# Patient Record
Sex: Male | Born: 1935 | Race: White | Hispanic: No | Marital: Married | Smoking: Never smoker
Health system: Southern US, Community
[De-identification: ages and names within clinical notes are randomized; demographics above are authoritative.]

## PROBLEM LIST (undated history)

## (undated) ENCOUNTER — Emergency Department (HOSPITAL_COMMUNITY): Payer: Medicare Other

## (undated) DIAGNOSIS — I219 Acute myocardial infarction, unspecified: Secondary | ICD-10-CM

## (undated) DIAGNOSIS — D649 Anemia, unspecified: Secondary | ICD-10-CM

## (undated) DIAGNOSIS — B0229 Other postherpetic nervous system involvement: Secondary | ICD-10-CM

## (undated) DIAGNOSIS — E785 Hyperlipidemia, unspecified: Secondary | ICD-10-CM

## (undated) DIAGNOSIS — E559 Vitamin D deficiency, unspecified: Secondary | ICD-10-CM

## (undated) DIAGNOSIS — I10 Essential (primary) hypertension: Secondary | ICD-10-CM

## (undated) DIAGNOSIS — I639 Cerebral infarction, unspecified: Secondary | ICD-10-CM

## (undated) DIAGNOSIS — I2510 Atherosclerotic heart disease of native coronary artery without angina pectoris: Secondary | ICD-10-CM

## (undated) DIAGNOSIS — R12 Heartburn: Secondary | ICD-10-CM

## (undated) DIAGNOSIS — K219 Gastro-esophageal reflux disease without esophagitis: Secondary | ICD-10-CM

## (undated) DIAGNOSIS — K5900 Constipation, unspecified: Secondary | ICD-10-CM

## (undated) DIAGNOSIS — G459 Transient cerebral ischemic attack, unspecified: Secondary | ICD-10-CM

## (undated) DIAGNOSIS — I4891 Unspecified atrial fibrillation: Secondary | ICD-10-CM

## (undated) HISTORY — DX: Atherosclerotic heart disease of native coronary artery without angina pectoris: I25.10

## (undated) HISTORY — DX: Other postherpetic nervous system involvement: B02.29

## (undated) HISTORY — DX: Heartburn: R12

## (undated) HISTORY — DX: Vitamin D deficiency, unspecified: E55.9

## (undated) HISTORY — DX: Essential (primary) hypertension: I10

## (undated) HISTORY — DX: Cerebral infarction, unspecified: I63.9

## (undated) HISTORY — DX: Transient cerebral ischemic attack, unspecified: G45.9

## (undated) HISTORY — DX: Unspecified atrial fibrillation: I48.91

## (undated) HISTORY — DX: Acute myocardial infarction, unspecified: I21.9

## (undated) HISTORY — PX: OTHER SURGICAL HISTORY: SHX169

## (undated) HISTORY — PX: COLONOSCOPY: SHX174

## (undated) HISTORY — PX: CATARACT EXTRACTION, BILATERAL: SHX1313

## (undated) HISTORY — DX: Hyperlipidemia, unspecified: E78.5

## (undated) HISTORY — DX: Anemia, unspecified: D64.9

---

## 2012-06-13 DIAGNOSIS — M79606 Pain in leg, unspecified: Secondary | ICD-10-CM | POA: Insufficient documentation

## 2013-07-02 ENCOUNTER — Encounter: Payer: Self-pay | Admitting: Podiatry

## 2013-07-02 ENCOUNTER — Ambulatory Visit (INDEPENDENT_AMBULATORY_CARE_PROVIDER_SITE_OTHER): Payer: Medicare Other | Admitting: Podiatry

## 2013-07-02 VITALS — BP 143/68 | HR 64 | Resp 12

## 2013-07-02 DIAGNOSIS — B351 Tinea unguium: Secondary | ICD-10-CM

## 2013-07-02 DIAGNOSIS — M79609 Pain in unspecified limb: Secondary | ICD-10-CM

## 2013-07-02 NOTE — Progress Notes (Signed)
   Subjective:    Patient ID: Edgar Thomas, male    DOB: 04/30/1930, 80 y.o.   MRN: [id number]  HPI '' B/L TOENAILS ARE THICK AND HAVE DISCOLORATION. TREATMENT TRIED LASER GOTTEN BETTER IN 2002.''  This patient presents today with with his wife complaining of uncomfortable thickened toenails. He said previous laser treatment which brought some temporary reduction of symptoms.   Review of Systems  Endocrine: Positive for cold intolerance.  Hematological: Bruises/bleeds easily.  All other systems reviewed and are negative.       Objective:   Physical Exam   An 80 year old white male who appears younger than his stated age is orientated x3. His wife is present today.  Vascular: The DP and PT pulses are 2/4 bilaterally  Neurological: Sensation intact bilaterally  Dermatological: Hypertrophic, discolored, incurvated toenails x10. Mild atrophic skin noted on feet bilaterally.  Musculoskeletal: No deformities noted      Assessment & Plan:   Assessment: Symptomatic onychomycoses x10 Satisfactory neurovascular status  Plan: All 10 toenails were debrided today without a bleeding. We discussed given options for mycotic toenails including laser, oral medication and topical medication. Patient is satisfied with repetitive debridement and will return on a when necessary basis.

## 2013-11-21 ENCOUNTER — Institutional Professional Consult (permissible substitution) (INDEPENDENT_AMBULATORY_CARE_PROVIDER_SITE_OTHER): Payer: Medicare Other | Admitting: Cardiothoracic Surgery

## 2013-11-21 ENCOUNTER — Encounter: Payer: Self-pay | Admitting: Cardiothoracic Surgery

## 2013-11-21 VITALS — BP 152/83 | HR 44 | Resp 20 | Ht 68.9 in | Wt 156.5 lb

## 2013-11-21 DIAGNOSIS — I2510 Atherosclerotic heart disease of native coronary artery without angina pectoris: Secondary | ICD-10-CM

## 2013-11-21 DIAGNOSIS — I209 Angina pectoris, unspecified: Secondary | ICD-10-CM

## 2013-11-21 DIAGNOSIS — I25119 Atherosclerotic heart disease of native coronary artery with unspecified angina pectoris: Secondary | ICD-10-CM

## 2013-11-24 ENCOUNTER — Telehealth: Payer: Self-pay

## 2013-11-24 ENCOUNTER — Other Ambulatory Visit: Payer: Self-pay | Admitting: *Deleted

## 2013-11-24 DIAGNOSIS — I2510 Atherosclerotic heart disease of native coronary artery without angina pectoris: Secondary | ICD-10-CM

## 2013-11-24 NOTE — Telephone Encounter (Signed)
Mr. Orvis BrillLoflin was seen in our office 6/26 and scheduled for surgery 11/26/13.  Wife, Conception ChancyKim Aird called 11/24/13 and said Mr. Wixon had thought about the surgery all weekend and wishes to wait until after the July 4th holiday to proceed.  She was instructed that the surgery time for 7/1 is still available and he should consider keeping it.  She was advised to call our office if the patient changes his mind.  Another phone call will be made later today to confirm that they do not wish to proceed with surgery 11/26/13.

## 2013-11-24 NOTE — Progress Notes (Signed)
[address]       [place] [id number]             [phone]                    Edgar ChurchBilly Thomas [facility] Medical Record #[id number] Date of Birth: 11/19/1929  Referring: Konrad Thomas, Brad, MD Primary Care: Konrad Thomas,BRAD, MD  Chief Complaint:    Chief Complaint  Patient presents with  . Coronary Artery Disease    Surgical eval, Cardiac Cath, ECHO [facility]17/[facility]- [facility]     History of Present Illness:    Edgar ChurchBilly Thomas 80 y.o. male is seen in the office  today at the patient's request for coronary artery disease. The patient has a long history of coronary artery disease, he first had a myocardial infarction [facility] by Dr. Theron AristaPeter with angioplasty of the LAD 23 years ago. 19 years ago he had a repeat angioplasty of distal right coronary artery branch. Since that time he's done relatively well with changes of lifestyle and diet through a wellness program in [place]. One week ago while home he was awakened by chest discomfort and because of her concern of a long wait in [facility] drove himself from [place] to [place]. [facility]. Echocardiogram and cardiac catheterization were performed on 11/17/2013 dr  Rolene ArbourHAKAS. The patient notes that he saw a surgeon in the hospital but decided to delay in making a decision and was discharged home. He comes to the office today for a second opinion because I had previously operated on his son. Should be noted in his cardiology consultation that he presented with episode of atrial fibrillation troponin elevation to 15. Since discharge home he is remained inactive and denies any further symptoms.   Echocardiogram showed left ventricular size mildly dilated ejection fraction 40-45% mild mitral regurgitation mild aortic regurgitation Cardiac catheterization: Shows diffuse disease of at least 50% left main LAD has a 99% stenosis at the ostium circumflex is a medium caliber  nondominant vessel with a 90% ostial stenosis and a second 90% stenosis in the mid circumflex the right coronary artery is heavily calcified 80% proximal PDA stenosis the right supplies collaterals to the left anterior descending   Current Activity/ Functional Status:  Patient is independent with mobility/ambulation, transfers, ADL's, IADL's.   Zubrod Score: At the time of surgery this patient's most appropriate activity status/level should be described as: []     0    Normal activity, no symptoms [x]     1    Restricted in physical strenuous activity but ambulatory, able to do out light work []     2    Ambulatory and capable of self care, unable to do work activities, up and about               >50 % of waking hours                              []     3    Only limited self care, in bed greater than 50% of waking hours []     4    Completely disabled, no self care, confined to bed or chair []     5    Moribund   Past Medical History  Diagnosis Date  . Hyperlipemia   . CAD (coronary artery disease)   . Heartburn   .  A-fib   . Hypertension   . Heart attack     Past Surgical History  Procedure Laterality Date  . Balloon angioplasty of lad      Family History  Problem Relation Age of Onset  . Heart disease Mother     died at age 4 of heart attack    History   Social History  . Marital Status: Married    Spouse Name: N/A    Number of Children: 6  . 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 Smoking status: Never Smoker   . Smokeless tobacco: Never Used  . Alcohol Use: No  . Drug Use: No  . Sexual Activity: Not on file   Other Topics Concern  . Not on file   Social History Narrative  . No narrative on file    History  Smoking status  . Never Smoker   Smokeless tobacco  . Never Used    History  Alcohol Use No     Allergies  Allergen Reactions  . Contrast Media [Iodinated Diagnostic Agents] Rash     19 years ago at [facility]    Current Outpatient Prescriptions  Medication Sig Dispense Refill  . aspirin 325 MG tablet Take 325 mg by mouth daily.      . metoprolol (LOPRESSOR) 50 MG tablet Take 50 mg by mouth 2 (two) times daily.      . Multiple Vitamin (MULTIVITAMIN) capsule Take 1 capsule by mouth daily.      . niacin (NIASPAN) 1000 MG CR tablet Take 1,000 mg by mouth at bedtime.      . nitroGLYCERIN (NITROSTAT) 0.4 MG SL tablet Place 0.4 mg under the tongue every 5 (five) minutes as needed for chest pain.      . Omega 3 1000 MG CAPS Take by mouth.      Marland Kitchen omeprazole (PRILOSEC) 20 MG capsule Take 20 mg by mouth daily.      . ramipril (ALTACE) 2.5 MG capsule Take 2.5 mg by mouth daily.      . rosuvastatin (CRESTOR) 40 MG tablet Take 40 mg by mouth daily.      . Vitamin D, Cholecalciferol, 1000 UNITS TABS Take by mouth.       No current facility-administered medications for this visit.     Review of Systems:     Cardiac Review of Systems: Y or N  Chest Pain [ y  ]  Resting SOB [n   ] Exertional SOB  [ n ]  Orthopnea [n  ]   Pedal Edema [n   ]    Palpitations Milo.Brash  ] Syncope  n[  ]   Presyncope [ n  ]  General Review of Systems: [Y] = yes [  ]=no Constitional: recent weight change [ n ];  Wt loss over the last 3 months [   ] anorexia [  ]; fatigue [  ]; nausea [  ]; night sweats [  ]; fever [  ]; or chills [  ];          Dental: poor dentition[  n]; Last Dentist visit:   Eye : blurred vision [  ]; diplopia [   ]; vision changes [  ];  Amaurosis fugax[  ]; Resp: cough [  ];  wheezing[  ];  hemoptysis[  ]; shortness of breath[  ]; paroxysmal nocturnal dyspnea[  ]; dyspnea on exertion[  ];  or orthopnea[  ];  GI:  gallstones[  ], vomitingn[  ];  dysphagia[  ]; melena[ n ];  hematochezia [  ]; heartburn[ y ];   Hx of  Colonoscopy[  ]; GU: kidney stones [  ]; hematuria[  ];   dysuria [  ];  nocturia[  ];  history of     obstruction [  ]; urinary frequency [  ]             Skin:  rash, swelling[  ];, hair loss[  ];  peripheral edema[  ];  or itching[  ]; Musculosketetal: myalgias[  ];  joint swelling[  ];  joint erythema[  ];  joint pain[  ];  back pain[  ];  Heme/Lymph: bruising[  ];  bleeding[  ];  anemia[  ];  Neuro: TIA[n  ];  headaches[  ];  stroke[n  ];  vertigo[  ];  seizures[  ];   paresthesias[  ];  difficulty walking[ n ];  Psych:depression[n  ]; anxiety[  n];  Endocrine: diabetes[n  ];  thyroid dysfunction[n  ];  Immunizations: Flu up to date Milo.Brash[n  ]; Pneumococcal up to date Milo.Brash[n  ];  Other:  Physical Exam: BP 152/83  Pulse 44  Resp 20  Ht 5' 8.9" (1.75 m)  Wt 156 lb 8.4 oz (71 kg)  BMI 23.18 kg/m2  SpO2 98%  PHYSICAL EXAMINATION:  General appearance: alert and cooperative Neurologic: intact Heart: regular rate and rhythm, S1, S2 normal, no murmur, click, rub or gallop Lungs: clear to auscultation bilaterally Abdomen: soft, non-tender; bowel sounds normal; no masses,  no organomegaly Extremities: extremities normal, atraumatic, no cyanosis or edema and Homans sign is negative, no sign of DVT Patient has no carotid bruits, does not appear to be in atrial fibrillation on this exam   Diagnostic Studies & Laboratory data:     Recent Radiology Findings:   No results found.    Recent Lab Findings: No results found for this basename: WBC, HGB, HCT, PLT, GLUCOSE, CHOL, TRIG, HDL, LDLDIRECT, LDLCALC, ALT, AST, NA, K, CL, CREATININE, BUN, CO2, TSH, INR, GLUF, HGBA1C      Assessment / Plan:   I have reviewed the patient's history exam and [facility] I agree with the recommendation made there with the patient's severe three-vessel coronary artery disease he will be best served with coronary artery bypass grafting. I've offered the patient to proceed quickly with this early next week. He notes that he has not decided he wants to have bypass surgery at this point and would like to think about it over the weekend. I made  it clear posterior he and his wife the nature of his high-grade lesions put him at extremely high risk of acute events. The risks and options of surgery were discussed in detail, as were the risks of delaying treatment.       I spent 60 minutes counseling the patient face to face. The total time spent in the appointment was 80 minutes.  Delight OvensEdward B Tyeler Goedken MD      [address] [place] [id number] Office [phone]   Beeper [phone]  11/24/2013 5:42 PM

## 2013-11-25 ENCOUNTER — Other Ambulatory Visit: Payer: Self-pay | Admitting: *Deleted

## 2013-11-25 ENCOUNTER — Encounter: Payer: Self-pay | Admitting: Cardiothoracic Surgery

## 2013-11-25 DIAGNOSIS — I2510 Atherosclerotic heart disease of native coronary artery without angina pectoris: Secondary | ICD-10-CM

## 2013-11-27 NOTE — Pre-Procedure Instructions (Signed)
Edgar Thomas  11/27/2013   Your procedure is scheduled on:  Wednesday December 03, 2013 at 8:30 AM.  [facility] at 6:30 AM.  Call this number if you have problems the morning of surgery: [phone]   Remember:   Do not eat food or drink liquids after midnight.   Take these medicines the morning of surgery with A SIP OF WATER: Metoprolol (Lopressor), Omeprazole (Prilosec)   Stop taking NSAIDS and herbals (Ex. Multivitamin, Omega 3, Vitamin D, Selenium)    Do not wear jewelry.  Do not wear lotions, powders, or cologne.  Men may shave face and neck.  Do not bring valuables to the hospital.  [facility] is not responsible for any belongings or valuables.               Contacts, dentures or bridgework may not be worn into surgery.  Leave suitcase in the car. After surgery it may be brought to your room.  For patients admitted to the hospital, discharge time is determined by your treatment team.               Patients discharged the day of surgery will not be allowed to drive home.  Name and phone number of your driver: Family/Friend  Special Instructions: Shower using CHG soap the night before and the morning of your surgery   Please read over the following fact sheets that you were given: Pain Booklet, Coughing and Deep Breathing, Blood Transfusion Information, Open Heart Packet, MRSA Information and Surgical Site Infection Prevention

## 2013-12-01 ENCOUNTER — Ambulatory Visit (HOSPITAL_COMMUNITY)
Admission: RE | Admit: 2013-12-01 | Discharge: 2013-12-01 | Disposition: A | Discharge status: Home / Self Care | Payer: Medicare Other | Source: Ambulatory Visit | Attending: Cardiothoracic Surgery | Admitting: Cardiothoracic Surgery

## 2013-12-01 ENCOUNTER — Encounter (HOSPITAL_COMMUNITY): Payer: Self-pay

## 2013-12-01 ENCOUNTER — Other Ambulatory Visit (HOSPITAL_COMMUNITY): Payer: BC Managed Care – PPO

## 2013-12-01 ENCOUNTER — Encounter (HOSPITAL_COMMUNITY)
Admission: RE | Admit: 2013-12-01 | Discharge: 2013-12-01 | Disposition: A | Discharge status: Home / Self Care | Payer: Medicare Other | Source: Ambulatory Visit | Attending: Cardiothoracic Surgery | Admitting: Cardiothoracic Surgery

## 2013-12-01 VITALS — BP 153/88 | HR 48 | Temp 97.8°F | Resp 20 | Ht 68.5 in | Wt 154.6 lb

## 2013-12-01 DIAGNOSIS — Z01810 Encounter for preprocedural cardiovascular examination: Secondary | ICD-10-CM | POA: Insufficient documentation

## 2013-12-01 DIAGNOSIS — I2510 Atherosclerotic heart disease of native coronary artery without angina pectoris: Secondary | ICD-10-CM | POA: Insufficient documentation

## 2013-12-01 DIAGNOSIS — I6529 Occlusion and stenosis of unspecified carotid artery: Secondary | ICD-10-CM | POA: Insufficient documentation

## 2013-12-01 DIAGNOSIS — I658 Occlusion and stenosis of other precerebral arteries: Secondary | ICD-10-CM | POA: Insufficient documentation

## 2013-12-01 HISTORY — DX: Constipation, unspecified: K59.00

## 2013-12-01 HISTORY — DX: Gastro-esophageal reflux disease without esophagitis: K21.9

## 2013-12-01 LAB — PULMONARY FUNCTION TEST
DL/VA % pred: 137 %
DL/VA: 6.14 ml/min/mmHg/L
DLCO cor % pred: 126 %
DLCO cor: 37.56 ml/min/mmHg
DLCO unc % pred: 126 %
DLCO unc: 37.56 ml/min/mmHg
FEF 25-75 Pre: 2.18 L/sec
FEF2575-%Pred-Pre: 128 %
FEV1-%Pred-Pre: 112 %
FEV1-Pre: 2.85 L
FEV1FVC-%Pred-Pre: 106 %
FEV6-%Pred-Pre: 111 %
FEV6-Pre: 3.73 L
FEV6FVC-%Pred-Pre: 106 %
FVC-%Pred-Pre: 104 %
FVC-Pre: 3.78 L
Pre FEV1/FVC ratio: 75 %
Pre FEV6/FVC Ratio: 99 %
RV % pred: 85 %
RV: 2.21 L
TLC % pred: 88 %
TLC: 5.93 L

## 2013-12-01 LAB — URINALYSIS, ROUTINE W REFLEX MICROSCOPIC
Bilirubin Urine: NEGATIVE
Glucose, UA: NEGATIVE mg/dL
Hgb urine dipstick: NEGATIVE
Ketones, ur: 15 mg/dL — AB
Leukocytes, UA: NEGATIVE
Nitrite: NEGATIVE
Protein, ur: NEGATIVE mg/dL
Specific Gravity, Urine: 1.015 (ref 1.005–1.030)
Urobilinogen, UA: 0.2 mg/dL (ref 0.0–1.0)
pH: 6 (ref 5.0–8.0)

## 2013-12-01 LAB — BLOOD GAS, ARTERIAL
Acid-base deficit: 0.3 mmol/L (ref 0.0–2.0)
Bicarbonate: 23.7 mEq/L (ref 20.0–24.0)
Drawn by: 344381
FIO2: 0.21 %
O2 Saturation: 97.8 %
Patient temperature: 98.6
TCO2: 24.9 mmol/L (ref 0–100)
pCO2 arterial: 37.6 mmHg (ref 35.0–45.0)
pH, Arterial: 7.416 (ref 7.350–7.450)
pO2, Arterial: 89.1 mmHg (ref 80.0–100.0)

## 2013-12-01 LAB — COMPREHENSIVE METABOLIC PANEL
ALT: 33 U/L (ref 0–53)
AST: 36 U/L (ref 0–37)
Albumin: 3.6 g/dL (ref 3.5–5.2)
Alkaline Phosphatase: 70 U/L (ref 39–117)
Anion gap: 16 — ABNORMAL HIGH (ref 5–15)
BUN: 21 mg/dL (ref 6–23)
CO2: 20 mEq/L (ref 19–32)
Calcium: 9.2 mg/dL (ref 8.4–10.5)
Chloride: 102 mEq/L (ref 96–112)
Creatinine, Ser: 0.65 mg/dL (ref 0.50–1.35)
GFR calc Af Amer: >90 mL/min (ref >90)
GFR calc non Af Amer: 89 mL/min — ABNORMAL LOW (ref >90)
Glucose, Bld: 95 mg/dL (ref 70–99)
Potassium: 4.2 mEq/L (ref 3.7–5.3)
Sodium: 138 mEq/L (ref 137–147)
Total Bilirubin: 0.8 mg/dL (ref 0.3–1.2)
Total Protein: 7.4 g/dL (ref 6.0–8.3)

## 2013-12-01 LAB — CBC
HCT: 45.1 % (ref 39.0–52.0)
Hemoglobin: 15.3 g/dL (ref 13.0–17.0)
MCH: 33.3 pg (ref 26.0–34.0)
MCHC: 33.9 g/dL (ref 30.0–36.0)
MCV: 98 fL (ref 78.0–100.0)
Platelets: 139 10*3/uL — ABNORMAL LOW (ref 150–400)
RBC: 4.6 MIL/uL (ref 4.22–5.81)
RDW: 13.8 % (ref 11.5–15.5)
WBC: 8.1 10*3/uL (ref 4.0–10.5)

## 2013-12-01 LAB — ABO/RH: ABO/RH(D): O POS

## 2013-12-01 LAB — PROTIME-INR
INR: 1.1 (ref 0.00–1.49)
Prothrombin Time: 14.2 seconds (ref 11.6–15.2)

## 2013-12-01 LAB — SURGICAL PCR SCREEN
MRSA, PCR: NEGATIVE
Staphylococcus aureus: NEGATIVE

## 2013-12-01 LAB — HEMOGLOBIN A1C
Hgb A1c MFr Bld: 5.8 % — ABNORMAL HIGH (ref <5.7)
Mean Plasma Glucose: 120 mg/dL — ABNORMAL HIGH (ref <117)

## 2013-12-01 LAB — APTT: aPTT: 27 seconds (ref 24–37)

## 2013-12-01 NOTE — Progress Notes (Signed)
VASCULAR LAB PRELIMINARY  PRELIMINARY  PRELIMINARY  PRELIMINARY  Pre-op Cardiac Surgery  Carotid Findings:  Rt - 40% to 59% ICA stenosis upper end of scale. Left - 1% to 39% ICA stenosis. Bilateral - Vertebral arery flow is antegrade.  Upper Extremity Right Left  Brachial Pressures 185 Triphasic 187 Triphasic  Radial Waveforms Triphasic Triphasic  Ulnar Waveforms Triphasic Triphasic  Palmar Arch (Allen's Test) Abnormal Norma   Findings:  Doppler waveforms obliterated woth radial compression and remained normal with ulnar compression on the right. Left Doppler waveforms remained normal with both radial and ulnar compressions    Lower  Extremity Right Left  Dorsalis Pedis    Anterior Tibial    Posterior Tibial    Ankle/Brachial Indices      Findings:  Palpable pedal pulses bilaterally.   Corrado Hymon, RVS 12/01/2013, 1:11 PM

## 2013-12-01 NOTE — Progress Notes (Signed)
PCP is Konrad FelixBrad Thomas. Patients wife at bedside during PAT visit. Patient denied having any acute cardiac or pulmonary issues. Patients HR was noted to be 48 during PAT visit and Nurse noticed that patient is taking 50 mg of Metoprolol. Nurse explained to patient that Metoprolol is normally not given per hospital policy if HR is 60 or lower. Patient stated that he was going to talk to Dr. Tyrone SageGerhardt about it after PAT visit as he had questions about Metoprolol.

## 2013-12-01 NOTE — Progress Notes (Signed)
12/01/13 1349  OBSTRUCTIVE SLEEP APNEA  Have you ever been diagnosed with sleep apnea through a sleep study? No  Do you snore loudly (loud enough to be heard through closed doors)?  0  Do you often feel tired, fatigued, or sleepy during the daytime? 0  Has anyone observed you stop breathing during your sleep? 0  Do you have, or are you being treated for high blood pressure? 1  BMI more than 35 kg/m2? 0  Age over 759 years old? 1  Neck circumference greater than 40 cm/16 inches? 1  Gender: 1  Obstructive Sleep Apnea Score 4  Score 4 or greater  Results sent to PCP   This patient has screened at risk for sleep apnea using the STOP Bang tool used during a pre-surgical visit. A score of 4 or greater is at risk for sleep apnea.

## 2013-12-02 MED ORDER — DEXMEDETOMIDINE HCL IN NACL 400 MCG/100ML IV SOLN
0.1000 ug/kg/h | INTRAVENOUS | Status: AC
  Administered 2013-12-03: .2 ug/kg/h via INTRAVENOUS
  Filled 2013-12-02: qty 100

## 2013-12-02 MED ORDER — DEXTROSE 5 % IV SOLN
750.0000 mg | INTRAVENOUS | Status: DC
  Filled 2013-12-02: qty 750

## 2013-12-02 MED ORDER — MAGNESIUM SULFATE 50 % IJ SOLN
40.0000 meq | INTRAMUSCULAR | Status: DC
  Filled 2013-12-02: qty 10

## 2013-12-02 MED ORDER — SODIUM CHLORIDE 0.9 % IV SOLN
INTRAVENOUS | Status: AC
  Administered 2013-12-03: 69.8 mL/h via INTRAVENOUS
  Filled 2013-12-02: qty 40

## 2013-12-02 MED ORDER — HEPARIN SODIUM (PORCINE) 1000 UNIT/ML IJ SOLN
INTRAMUSCULAR | Status: DC
  Filled 2013-12-02: qty 30

## 2013-12-02 MED ORDER — DOPAMINE-DEXTROSE 3.2-5 MG/ML-% IV SOLN
2.0000 ug/kg/min | INTRAVENOUS | Status: DC
  Filled 2013-12-02: qty 250

## 2013-12-02 MED ORDER — SODIUM CHLORIDE 0.9 % IV SOLN
INTRAVENOUS | Status: AC
  Administered 2013-12-03: 1.7 [IU]/h via INTRAVENOUS
  Filled 2013-12-02: qty 1

## 2013-12-02 MED ORDER — NITROGLYCERIN IN D5W 200-5 MCG/ML-% IV SOLN
2.0000 ug/min | INTRAVENOUS | Status: AC
  Administered 2013-12-03: 3 ug/min via INTRAVENOUS
  Filled 2013-12-02: qty 250

## 2013-12-02 MED ORDER — PLASMA-LYTE 148 IV SOLN
INTRAVENOUS | Status: AC
  Administered 2013-12-03: 09:00:00
  Filled 2013-12-02: qty 2.5

## 2013-12-02 MED ORDER — VANCOMYCIN HCL 10 G IV SOLR
1250.0000 mg | INTRAVENOUS | Status: AC
  Administered 2013-12-03: 1250 mg via INTRAVENOUS
  Filled 2013-12-02: qty 1250

## 2013-12-02 MED ORDER — POTASSIUM CHLORIDE 2 MEQ/ML IV SOLN
80.0000 meq | INTRAVENOUS | Status: DC
  Filled 2013-12-02: qty 40

## 2013-12-02 MED ORDER — PHENYLEPHRINE HCL 10 MG/ML IJ SOLN
30.0000 ug/min | INTRAMUSCULAR | Status: AC
  Administered 2013-12-03: 20 ug/min via INTRAVENOUS
  Filled 2013-12-02: qty 2

## 2013-12-02 MED ORDER — DEXTROSE 5 % IV SOLN
0.5000 ug/min | INTRAVENOUS | Status: DC
  Filled 2013-12-02: qty 4

## 2013-12-02 MED ORDER — CEFUROXIME SODIUM 1.5 G IJ SOLR
1.5000 g | INTRAMUSCULAR | Status: AC
  Administered 2013-12-03: .75 g via INTRAVENOUS
  Administered 2013-12-03: 1.5 g via INTRAVENOUS
  Filled 2013-12-02: qty 1.5

## 2013-12-02 MED ORDER — CHLORHEXIDINE GLUCONATE 4 % EX LIQD
30.0000 mL | CUTANEOUS | Status: DC
  Filled 2013-12-02: qty 30

## 2013-12-02 MED ORDER — METOPROLOL TARTRATE 12.5 MG HALF TABLET: 12.5000 mg | ORAL_TABLET | Freq: Once | ORAL | Status: DC

## 2013-12-03 ENCOUNTER — Encounter (HOSPITAL_COMMUNITY): Payer: Self-pay | Admitting: *Deleted

## 2013-12-03 ENCOUNTER — Inpatient Hospital Stay (HOSPITAL_COMMUNITY): Payer: Medicare Other | Admitting: Certified Registered"

## 2013-12-03 ENCOUNTER — Encounter (HOSPITAL_COMMUNITY): Payer: Medicare Other | Admitting: Vascular Surgery

## 2013-12-03 ENCOUNTER — Inpatient Hospital Stay (HOSPITAL_COMMUNITY)
Admission: RE | Admit: 2013-12-03 | Discharge: 2013-12-11 | DRG: 236 | Disposition: A | Discharge status: Home / Self Care | Payer: Medicare Other | Source: Ambulatory Visit | Attending: Cardiothoracic Surgery | Admitting: Cardiothoracic Surgery

## 2013-12-03 ENCOUNTER — Inpatient Hospital Stay (HOSPITAL_COMMUNITY): Payer: Medicare Other

## 2013-12-03 ENCOUNTER — Encounter (HOSPITAL_COMMUNITY)
Admission: RE | Disposition: A | Discharge status: Home / Self Care | Payer: Medicare Other | Source: Ambulatory Visit | Attending: Cardiothoracic Surgery

## 2013-12-03 DIAGNOSIS — E8779 Other fluid overload: Secondary | ICD-10-CM | POA: Diagnosis not present

## 2013-12-03 DIAGNOSIS — T502X5A Adverse effect of carbonic-anhydrase inhibitors, benzothiadiazides and other diuretics, initial encounter: Secondary | ICD-10-CM | POA: Diagnosis not present

## 2013-12-03 DIAGNOSIS — E785 Hyperlipidemia, unspecified: Secondary | ICD-10-CM | POA: Diagnosis present

## 2013-12-03 DIAGNOSIS — J9819 Other pulmonary collapse: Secondary | ICD-10-CM | POA: Diagnosis not present

## 2013-12-03 DIAGNOSIS — E871 Hypo-osmolality and hyponatremia: Secondary | ICD-10-CM | POA: Diagnosis not present

## 2013-12-03 DIAGNOSIS — I519 Heart disease, unspecified: Secondary | ICD-10-CM | POA: Diagnosis not present

## 2013-12-03 DIAGNOSIS — I959 Hypotension, unspecified: Secondary | ICD-10-CM | POA: Diagnosis present

## 2013-12-03 DIAGNOSIS — Z8249 Family history of ischemic heart disease and other diseases of the circulatory system: Secondary | ICD-10-CM | POA: Diagnosis not present

## 2013-12-03 DIAGNOSIS — Z79899 Other long term (current) drug therapy: Secondary | ICD-10-CM | POA: Diagnosis not present

## 2013-12-03 DIAGNOSIS — D62 Acute posthemorrhagic anemia: Secondary | ICD-10-CM | POA: Diagnosis not present

## 2013-12-03 DIAGNOSIS — I2510 Atherosclerotic heart disease of native coronary artery without angina pectoris: Secondary | ICD-10-CM

## 2013-12-03 DIAGNOSIS — I480 Paroxysmal atrial fibrillation: Secondary | ICD-10-CM

## 2013-12-03 DIAGNOSIS — I200 Unstable angina: Secondary | ICD-10-CM | POA: Diagnosis present

## 2013-12-03 DIAGNOSIS — I10 Essential (primary) hypertension: Secondary | ICD-10-CM | POA: Diagnosis present

## 2013-12-03 DIAGNOSIS — I498 Other specified cardiac arrhythmias: Secondary | ICD-10-CM | POA: Diagnosis not present

## 2013-12-03 DIAGNOSIS — D6959 Other secondary thrombocytopenia: Secondary | ICD-10-CM | POA: Diagnosis not present

## 2013-12-03 DIAGNOSIS — I255 Ischemic cardiomyopathy: Secondary | ICD-10-CM | POA: Diagnosis present

## 2013-12-03 DIAGNOSIS — R112 Nausea with vomiting, unspecified: Secondary | ICD-10-CM | POA: Diagnosis not present

## 2013-12-03 DIAGNOSIS — R7309 Other abnormal glucose: Secondary | ICD-10-CM | POA: Diagnosis present

## 2013-12-03 DIAGNOSIS — Y832 Surgical operation with anastomosis, bypass or graft as the cause of abnormal reaction of the patient, or of later complication, without mention of misadventure at the time of the procedure: Secondary | ICD-10-CM | POA: Diagnosis not present

## 2013-12-03 DIAGNOSIS — Y9210 Unspecified residential institution as the place of occurrence of the external cause: Secondary | ICD-10-CM | POA: Diagnosis not present

## 2013-12-03 DIAGNOSIS — K5900 Constipation, unspecified: Secondary | ICD-10-CM | POA: Diagnosis not present

## 2013-12-03 DIAGNOSIS — Z91041 Radiographic dye allergy status: Secondary | ICD-10-CM

## 2013-12-03 DIAGNOSIS — Z9861 Coronary angioplasty status: Secondary | ICD-10-CM

## 2013-12-03 DIAGNOSIS — I252 Old myocardial infarction: Secondary | ICD-10-CM | POA: Diagnosis not present

## 2013-12-03 DIAGNOSIS — Z7901 Long term (current) use of anticoagulants: Secondary | ICD-10-CM

## 2013-12-03 DIAGNOSIS — Z7982 Long term (current) use of aspirin: Secondary | ICD-10-CM | POA: Diagnosis not present

## 2013-12-03 DIAGNOSIS — I2589 Other forms of chronic ischemic heart disease: Secondary | ICD-10-CM | POA: Diagnosis present

## 2013-12-03 DIAGNOSIS — K219 Gastro-esophageal reflux disease without esophagitis: Secondary | ICD-10-CM | POA: Diagnosis present

## 2013-12-03 DIAGNOSIS — I4891 Unspecified atrial fibrillation: Secondary | ICD-10-CM | POA: Diagnosis not present

## 2013-12-03 DIAGNOSIS — I214 Non-ST elevation (NSTEMI) myocardial infarction: Secondary | ICD-10-CM | POA: Diagnosis present

## 2013-12-03 DIAGNOSIS — E782 Mixed hyperlipidemia: Secondary | ICD-10-CM | POA: Diagnosis present

## 2013-12-03 DIAGNOSIS — Z951 Presence of aortocoronary bypass graft: Secondary | ICD-10-CM

## 2013-12-03 HISTORY — PX: CORONARY ARTERY BYPASS GRAFT: SHX141

## 2013-12-03 HISTORY — PX: ENDOVEIN HARVEST OF GREATER SAPHENOUS VEIN: SHX5059

## 2013-12-03 HISTORY — PX: INTRAOPERATIVE TRANSESOPHAGEAL ECHOCARDIOGRAM: SHX5062

## 2013-12-03 LAB — CREATININE, SERUM
Creatinine, Ser: 0.63 mg/dL (ref 0.50–1.35)
GFR calc Af Amer: >90 mL/min (ref >90)
GFR calc non Af Amer: 90 mL/min — ABNORMAL LOW (ref >90)

## 2013-12-03 LAB — POCT I-STAT 3, ART BLOOD GAS (G3+)
Acid-Base Excess: 3 mmol/L — ABNORMAL HIGH (ref 0.0–2.0)
Acid-base deficit: 2 mmol/L (ref 0.0–2.0)
Acid-base deficit: 3 mmol/L — ABNORMAL HIGH (ref 0.0–2.0)
Bicarbonate: 27.8 mEq/L — ABNORMAL HIGH (ref 20.0–24.0)
Bicarbonate: 24 mEq/L (ref 20.0–24.0)
Bicarbonate: 23.2 mEq/L (ref 20.0–24.0)
Bicarbonate: 22.3 mEq/L (ref 20.0–24.0)
O2 Saturation: 100 %
O2 Saturation: 93 %
O2 Saturation: 94 %
O2 Saturation: 92 %
Patient temperature: 37.1
Patient temperature: 37.1
TCO2: 29 mmol/L (ref 0–100)
TCO2: 25 mmol/L (ref 0–100)
TCO2: 24 mmol/L (ref 0–100)
TCO2: 24 mmol/L (ref 0–100)
pCO2 arterial: 44.3 mmHg (ref 35.0–45.0)
pCO2 arterial: 37.5 mmHg (ref 35.0–45.0)
pCO2 arterial: 42.4 mmHg (ref 35.0–45.0)
pCO2 arterial: 41.2 mmHg (ref 35.0–45.0)
pH, Arterial: 7.406 (ref 7.350–7.450)
pH, Arterial: 7.414 (ref 7.350–7.450)
pH, Arterial: 7.346 — ABNORMAL LOW (ref 7.350–7.450)
pH, Arterial: 7.341 — ABNORMAL LOW (ref 7.350–7.450)
pO2, Arterial: 244 mmHg — ABNORMAL HIGH (ref 80.0–100.0)
pO2, Arterial: 68 mmHg — ABNORMAL LOW (ref 80.0–100.0)
pO2, Arterial: 77 mmHg — ABNORMAL LOW (ref 80.0–100.0)
pO2, Arterial: 67 mmHg — ABNORMAL LOW (ref 80.0–100.0)

## 2013-12-03 LAB — POCT I-STAT, CHEM 8
BUN: 8 mg/dL (ref 6–23)
BUN: 7 mg/dL (ref 6–23)
BUN: 7 mg/dL (ref 6–23)
BUN: 7 mg/dL (ref 6–23)
Calcium, Ion: 0.98 mmol/L — ABNORMAL LOW (ref 1.13–1.30)
Calcium, Ion: 0.98 mmol/L — ABNORMAL LOW (ref 1.13–1.30)
Calcium, Ion: 1.13 mmol/L (ref 1.13–1.30)
Calcium, Ion: 1 mmol/L — ABNORMAL LOW (ref 1.13–1.30)
Chloride: 105 mEq/L (ref 96–112)
Chloride: 99 mEq/L (ref 96–112)
Chloride: 109 mEq/L (ref 96–112)
Chloride: 115 mEq/L — ABNORMAL HIGH (ref 96–112)
Creatinine, Ser: 0.5 mg/dL (ref 0.50–1.35)
Creatinine, Ser: 0.5 mg/dL (ref 0.50–1.35)
Creatinine, Ser: 0.5 mg/dL (ref 0.50–1.35)
Creatinine, Ser: 0.7 mg/dL (ref 0.50–1.35)
Glucose, Bld: 120 mg/dL — ABNORMAL HIGH (ref 70–99)
Glucose, Bld: 121 mg/dL — ABNORMAL HIGH (ref 70–99)
Glucose, Bld: 106 mg/dL — ABNORMAL HIGH (ref 70–99)
Glucose, Bld: 110 mg/dL — ABNORMAL HIGH (ref 70–99)
HCT: 27 % — ABNORMAL LOW (ref 39.0–52.0)
HCT: 24 % — ABNORMAL LOW (ref 39.0–52.0)
HCT: 22 % — ABNORMAL LOW (ref 39.0–52.0)
HCT: 27 % — ABNORMAL LOW (ref 39.0–52.0)
Hemoglobin: 9.2 g/dL — ABNORMAL LOW (ref 13.0–17.0)
Hemoglobin: 8.2 g/dL — ABNORMAL LOW (ref 13.0–17.0)
Hemoglobin: 7.5 g/dL — ABNORMAL LOW (ref 13.0–17.0)
Hemoglobin: 9.2 g/dL — ABNORMAL LOW (ref 13.0–17.0)
Potassium: 4.2 mEq/L (ref 3.7–5.3)
Potassium: 3.6 mEq/L — ABNORMAL LOW (ref 3.7–5.3)
Potassium: 3.1 mEq/L — ABNORMAL LOW (ref 3.7–5.3)
Potassium: 4 mEq/L (ref 3.7–5.3)
Sodium: 135 mEq/L — ABNORMAL LOW (ref 137–147)
Sodium: 141 mEq/L (ref 137–147)
Sodium: 135 mEq/L — ABNORMAL LOW (ref 137–147)
Sodium: 137 mEq/L (ref 137–147)
TCO2: 25 mmol/L (ref 0–100)
TCO2: 21 mmol/L (ref 0–100)
TCO2: 22 mmol/L (ref 0–100)
TCO2: 22 mmol/L (ref 0–100)

## 2013-12-03 LAB — CBC
HCT: 29.5 % — ABNORMAL LOW (ref 39.0–52.0)
HCT: 28.3 % — ABNORMAL LOW (ref 39.0–52.0)
Hemoglobin: 10.1 g/dL — ABNORMAL LOW (ref 13.0–17.0)
Hemoglobin: 9.7 g/dL — ABNORMAL LOW (ref 13.0–17.0)
MCH: 32.6 pg (ref 26.0–34.0)
MCH: 32.7 pg (ref 26.0–34.0)
MCHC: 34.2 g/dL (ref 30.0–36.0)
MCHC: 34.3 g/dL (ref 30.0–36.0)
MCV: 95.2 fL (ref 78.0–100.0)
MCV: 95.3 fL (ref 78.0–100.0)
Platelets: 70 10*3/uL — ABNORMAL LOW (ref 150–400)
Platelets: 70 10*3/uL — ABNORMAL LOW (ref 150–400)
RBC: 3.1 MIL/uL — AB (ref 4.22–5.81)
RBC: 2.97 MIL/uL — ABNORMAL LOW (ref 4.22–5.81)
RDW: 13.6 % (ref 11.5–15.5)
RDW: 13.8 % (ref 11.5–15.5)
WBC: 7.6 10*3/uL (ref 4.0–10.5)
WBC: 9.2 10*3/uL (ref 4.0–10.5)

## 2013-12-03 LAB — POCT I-STAT 4, (NA,K, GLUC, HGB,HCT)
Glucose, Bld: 115 mg/dL — ABNORMAL HIGH (ref 70–99)
Glucose, Bld: 108 mg/dL — ABNORMAL HIGH (ref 70–99)
Glucose, Bld: 89 mg/dL (ref 70–99)
HCT: 42 % (ref 39.0–52.0)
HCT: 34 % — ABNORMAL LOW (ref 39.0–52.0)
HCT: 27 % — ABNORMAL LOW (ref 39.0–52.0)
Hemoglobin: 14.3 g/dL (ref 13.0–17.0)
Hemoglobin: 11.6 g/dL — ABNORMAL LOW (ref 13.0–17.0)
Hemoglobin: 9.2 g/dL — ABNORMAL LOW (ref 13.0–17.0)
Potassium: 3.9 mEq/L (ref 3.7–5.3)
Potassium: 4.1 mEq/L (ref 3.7–5.3)
Potassium: 4.1 mEq/L (ref 3.7–5.3)
Sodium: 139 mEq/L (ref 137–147)
Sodium: 140 mEq/L (ref 137–147)
Sodium: 136 mEq/L — ABNORMAL LOW (ref 137–147)

## 2013-12-03 LAB — GLUCOSE, CAPILLARY
Glucose-Capillary: 100 mg/dL — ABNORMAL HIGH (ref 70–99)
Glucose-Capillary: 103 mg/dL — ABNORMAL HIGH (ref 70–99)
Glucose-Capillary: 105 mg/dL — ABNORMAL HIGH (ref 70–99)
Glucose-Capillary: 154 mg/dL — ABNORMAL HIGH (ref 70–99)
Glucose-Capillary: 94 mg/dL (ref 70–99)

## 2013-12-03 LAB — MAGNESIUM: Magnesium: 3 mg/dL — ABNORMAL HIGH (ref 1.5–2.5)

## 2013-12-03 LAB — PROTIME-INR
INR: 1.82 — ABNORMAL HIGH (ref 0.00–1.49)
PROTHROMBIN TIME: 21.1 s — AB (ref 11.6–15.2)

## 2013-12-03 LAB — PLATELET COUNT: Platelets: 98 10*3/uL — ABNORMAL LOW (ref 150–400)

## 2013-12-03 LAB — APTT: APTT: 37 s (ref 24–37)

## 2013-12-03 SURGERY — CORONARY ARTERY BYPASS GRAFTING (CABG)
Anesthesia: General | Site: Leg Upper | Laterality: Right

## 2013-12-03 MED ORDER — METOPROLOL TARTRATE 50 MG PO TABS
50.0000 mg | ORAL_TABLET | Freq: Once | ORAL | Status: AC
  Administered 2013-12-03: 50 mg via ORAL
  Filled 2013-12-03: qty 1

## 2013-12-03 MED ORDER — DEXMEDETOMIDINE HCL IN NACL 200 MCG/50ML IV SOLN
0.1000 ug/kg/h | INTRAVENOUS | Status: DC
  Administered 2013-12-03: 0.7 ug/kg/h via INTRAVENOUS
  Filled 2013-12-03: qty 50

## 2013-12-03 MED ORDER — MIDAZOLAM HCL 10 MG/2ML IJ SOLN
INTRAMUSCULAR | Status: AC
  Filled 2013-12-03: qty 2

## 2013-12-03 MED ORDER — PROTAMINE SULFATE 10 MG/ML IV SOLN
INTRAVENOUS | Status: DC | PRN
  Administered 2013-12-03 (×2): 30 mg via INTRAVENOUS
  Administered 2013-12-03: 40 mg via INTRAVENOUS
  Administered 2013-12-03: 30 mg via INTRAVENOUS
  Administered 2013-12-03: 20 mg via INTRAVENOUS
  Administered 2013-12-03: 30 mg via INTRAVENOUS

## 2013-12-03 MED ORDER — FENTANYL CITRATE 0.05 MG/ML IJ SOLN
INTRAMUSCULAR | Status: AC
  Filled 2013-12-03: qty 5

## 2013-12-03 MED ORDER — PHENYLEPHRINE HCL 10 MG/ML IJ SOLN
0.0000 ug/min | INTRAVENOUS | Status: DC
  Administered 2013-12-04: 10 ug/min via INTRAVENOUS
  Filled 2013-12-03 (×2): qty 2

## 2013-12-03 MED ORDER — METOPROLOL TARTRATE 12.5 MG HALF TABLET
12.5000 mg | ORAL_TABLET | Freq: Two times a day (BID) | ORAL | Status: DC
  Administered 2013-12-04 – 2013-12-05 (×3): 12.5 mg via ORAL
  Filled 2013-12-03 (×7): qty 1

## 2013-12-03 MED ORDER — VECURONIUM BROMIDE 10 MG IV SOLR
INTRAVENOUS | Status: DC | PRN
  Administered 2013-12-03 (×2): 5 mg via INTRAVENOUS

## 2013-12-03 MED ORDER — PROTAMINE SULFATE 10 MG/ML IV SOLN
INTRAVENOUS | Status: AC
  Filled 2013-12-03: qty 25

## 2013-12-03 MED ORDER — ACETAMINOPHEN 160 MG/5ML PO SOLN
1000.0000 mg | Freq: Four times a day (QID) | ORAL | Status: AC
  Filled 2013-12-03: qty 40

## 2013-12-03 MED ORDER — MORPHINE SULFATE 2 MG/ML IJ SOLN
2.0000 mg | INTRAMUSCULAR | Status: DC | PRN
  Administered 2013-12-03 – 2013-12-05 (×10): 2 mg via INTRAVENOUS
  Filled 2013-12-03 (×10): qty 1

## 2013-12-03 MED ORDER — ROCURONIUM BROMIDE 100 MG/10ML IV SOLN
INTRAVENOUS | Status: DC | PRN
  Administered 2013-12-03 (×2): 50 mg via INTRAVENOUS

## 2013-12-03 MED ORDER — ARTIFICIAL TEARS OP OINT
TOPICAL_OINTMENT | OPHTHALMIC | Status: DC | PRN
  Administered 2013-12-03: 1 via OPHTHALMIC

## 2013-12-03 MED ORDER — ASPIRIN 81 MG PO CHEW: 324.0000 mg | CHEWABLE_TABLET | Freq: Every day | ORAL | Status: DC

## 2013-12-03 MED ORDER — STERILE WATER FOR INJECTION IJ SOLN
INTRAMUSCULAR | Status: AC
  Filled 2013-12-03: qty 10

## 2013-12-03 MED ORDER — SODIUM CHLORIDE 0.9 % IJ SOLN: 3.0000 mL | INTRAMUSCULAR | Status: DC | PRN

## 2013-12-03 MED ORDER — LACTATED RINGERS IV SOLN
INTRAVENOUS | Status: DC
  Administered 2013-12-03: 15:00:00 via INTRAVENOUS

## 2013-12-03 MED ORDER — MILRINONE IN DEXTROSE 20 MG/100ML IV SOLN: 0.1250 ug/kg/min | INTRAVENOUS | Status: DC

## 2013-12-03 MED ORDER — NITROGLYCERIN IN D5W 200-5 MCG/ML-% IV SOLN: 0.0000 ug/min | INTRAVENOUS | Status: DC

## 2013-12-03 MED ORDER — POTASSIUM CHLORIDE 10 MEQ/50ML IV SOLN
10.0000 meq | INTRAVENOUS | Status: AC
  Administered 2013-12-03 (×3): 10 meq via INTRAVENOUS

## 2013-12-03 MED ORDER — MIDAZOLAM HCL 2 MG/2ML IJ SOLN
INTRAMUSCULAR | Status: AC
  Filled 2013-12-03: qty 2

## 2013-12-03 MED ORDER — MILRINONE IN DEXTROSE 20 MG/100ML IV SOLN
0.2500 ug/kg/min | INTRAVENOUS | Status: DC
  Filled 2013-12-03: qty 100

## 2013-12-03 MED ORDER — 0.9 % SODIUM CHLORIDE (POUR BTL) OPTIME
TOPICAL | Status: DC | PRN
  Administered 2013-12-03: 1000 mL

## 2013-12-03 MED ORDER — FENTANYL CITRATE 0.05 MG/ML IJ SOLN
INTRAMUSCULAR | Status: DC | PRN
  Administered 2013-12-03 (×2): 150 ug via INTRAVENOUS
  Administered 2013-12-03: 200 ug via INTRAVENOUS
  Administered 2013-12-03: 150 ug via INTRAVENOUS
  Administered 2013-12-03: 100 ug via INTRAVENOUS
  Administered 2013-12-03: 250 ug via INTRAVENOUS
  Administered 2013-12-03 (×2): 100 ug via INTRAVENOUS
  Administered 2013-12-03: 150 ug via INTRAVENOUS
  Administered 2013-12-03: 50 ug via INTRAVENOUS
  Administered 2013-12-03: 200 ug via INTRAVENOUS

## 2013-12-03 MED ORDER — ACETAMINOPHEN 500 MG PO TABS
1000.0000 mg | ORAL_TABLET | Freq: Four times a day (QID) | ORAL | Status: AC
  Administered 2013-12-04 – 2013-12-06 (×6): 1000 mg via ORAL
  Filled 2013-12-03 (×18): qty 2

## 2013-12-03 MED ORDER — SODIUM CHLORIDE 0.9 % IV SOLN
INTRAVENOUS | Status: DC
  Filled 2013-12-03: qty 1

## 2013-12-03 MED ORDER — PHENYLEPHRINE 40 MCG/ML (10ML) SYRINGE FOR IV PUSH (FOR BLOOD PRESSURE SUPPORT)
PREFILLED_SYRINGE | INTRAVENOUS | Status: AC
  Filled 2013-12-03: qty 10

## 2013-12-03 MED ORDER — ACETAMINOPHEN 160 MG/5ML PO SOLN: 650.0000 mg | Freq: Once | ORAL | Status: AC

## 2013-12-03 MED ORDER — METOPROLOL TARTRATE 50 MG PO TABS
ORAL_TABLET | ORAL | Status: AC
  Administered 2013-12-03: 50 mg via ORAL
  Filled 2013-12-03: qty 1

## 2013-12-03 MED ORDER — SODIUM CHLORIDE 0.9 % IV SOLN
INTRAVENOUS | Status: DC
  Administered 2013-12-03: 15:00:00 via INTRAVENOUS

## 2013-12-03 MED ORDER — SUCCINYLCHOLINE CHLORIDE 20 MG/ML IJ SOLN
INTRAMUSCULAR | Status: AC
  Filled 2013-12-03: qty 1

## 2013-12-03 MED ORDER — LACTATED RINGERS IV SOLN
INTRAVENOUS | Status: DC | PRN
  Administered 2013-12-03 (×2): via INTRAVENOUS

## 2013-12-03 MED ORDER — ACETAMINOPHEN 650 MG RE SUPP
650.0000 mg | Freq: Once | RECTAL | Status: AC
  Administered 2013-12-03: 650 mg via RECTAL

## 2013-12-03 MED ORDER — LACTATED RINGERS IV SOLN: 500.0000 mL | Freq: Once | INTRAVENOUS | Status: AC | PRN

## 2013-12-03 MED ORDER — ONDANSETRON HCL 4 MG/2ML IJ SOLN
4.0000 mg | Freq: Four times a day (QID) | INTRAMUSCULAR | Status: DC | PRN
  Administered 2013-12-03 – 2013-12-05 (×6): 4 mg via INTRAVENOUS
  Filled 2013-12-03 (×7): qty 2

## 2013-12-03 MED ORDER — ALBUMIN HUMAN 5 % IV SOLN
INTRAVENOUS | Status: DC | PRN
  Administered 2013-12-03 (×3): via INTRAVENOUS

## 2013-12-03 MED ORDER — DOCUSATE SODIUM 100 MG PO CAPS
200.0000 mg | ORAL_CAPSULE | Freq: Every day | ORAL | Status: DC
  Administered 2013-12-04 – 2013-12-09 (×5): 200 mg via ORAL
  Filled 2013-12-03 (×7): qty 2

## 2013-12-03 MED ORDER — ROCURONIUM BROMIDE 50 MG/5ML IV SOLN
INTRAVENOUS | Status: AC
  Filled 2013-12-03: qty 2

## 2013-12-03 MED ORDER — INSULIN ASPART 100 UNIT/ML ~~LOC~~ SOLN
0.0000 [IU] | SUBCUTANEOUS | Status: DC
  Administered 2013-12-04: 17:00:00 via SUBCUTANEOUS
  Administered 2013-12-04 (×2): 2 [IU] via SUBCUTANEOUS
  Administered 2013-12-04: 13:00:00 via SUBCUTANEOUS
  Administered 2013-12-04 – 2013-12-05 (×7): 2 [IU] via SUBCUTANEOUS

## 2013-12-03 MED ORDER — ATORVASTATIN CALCIUM 80 MG PO TABS
80.0000 mg | ORAL_TABLET | Freq: Every day | ORAL | Status: DC
  Administered 2013-12-05: 80 mg via ORAL
  Filled 2013-12-03 (×4): qty 1

## 2013-12-03 MED ORDER — HEPARIN SODIUM (PORCINE) 1000 UNIT/ML IJ SOLN
INTRAMUSCULAR | Status: AC
  Filled 2013-12-03: qty 1

## 2013-12-03 MED ORDER — ALBUMIN HUMAN 5 % IV SOLN
250.0000 mL | INTRAVENOUS | Status: AC | PRN
  Administered 2013-12-03 (×4): 250 mL via INTRAVENOUS
  Filled 2013-12-03 (×2): qty 250

## 2013-12-03 MED ORDER — ARTIFICIAL TEARS OP OINT
TOPICAL_OINTMENT | OPHTHALMIC | Status: AC
  Filled 2013-12-03: qty 3.5

## 2013-12-03 MED ORDER — BISACODYL 5 MG PO TBEC
10.0000 mg | DELAYED_RELEASE_TABLET | Freq: Every day | ORAL | Status: DC
  Administered 2013-12-04 – 2013-12-09 (×4): 10 mg via ORAL
  Filled 2013-12-03 (×5): qty 2

## 2013-12-03 MED ORDER — GLYCOPYRROLATE 0.2 MG/ML IJ SOLN
INTRAMUSCULAR | Status: AC
  Filled 2013-12-03: qty 1

## 2013-12-03 MED ORDER — HEMOSTATIC AGENTS (NO CHARGE) OPTIME
TOPICAL | Status: DC | PRN
  Administered 2013-12-03 (×4): 1 via TOPICAL

## 2013-12-03 MED ORDER — ARTIFICIAL TEARS OP OINT
TOPICAL_OINTMENT | OPHTHALMIC | Status: AC
  Filled 2013-12-03: qty 7

## 2013-12-03 MED ORDER — PROPOFOL 10 MG/ML IV BOLUS
INTRAVENOUS | Status: DC | PRN
  Administered 2013-12-03: 100 mg via INTRAVENOUS

## 2013-12-03 MED ORDER — PHENYLEPHRINE HCL 10 MG/ML IJ SOLN
INTRAMUSCULAR | Status: AC
Start: 2013-12-03 — End: 2013-12-03
  Filled 2013-12-03: qty 2

## 2013-12-03 MED ORDER — PROPOFOL 10 MG/ML IV BOLUS
INTRAVENOUS | Status: AC
  Filled 2013-12-03: qty 20

## 2013-12-03 MED ORDER — SODIUM CHLORIDE 0.9 % IV SOLN: 250.0000 mL | INTRAVENOUS | Status: DC

## 2013-12-03 MED ORDER — LIDOCAINE HCL (CARDIAC) 20 MG/ML IV SOLN
INTRAVENOUS | Status: DC | PRN
  Administered 2013-12-03: 60 mg via INTRAVENOUS

## 2013-12-03 MED ORDER — METOPROLOL TARTRATE 1 MG/ML IV SOLN: 2.5000 mg | INTRAVENOUS | Status: DC | PRN

## 2013-12-03 MED ORDER — METOPROLOL TARTRATE 25 MG/10 ML ORAL SUSPENSION
12.5000 mg | Freq: Two times a day (BID) | ORAL | Status: DC
  Filled 2013-12-03 (×7): qty 5

## 2013-12-03 MED ORDER — LACTATED RINGERS IV SOLN
INTRAVENOUS | Status: DC | PRN
  Administered 2013-12-03: 08:00:00 via INTRAVENOUS

## 2013-12-03 MED ORDER — VECURONIUM BROMIDE 10 MG IV SOLR
INTRAVENOUS | Status: AC
  Filled 2013-12-03: qty 10

## 2013-12-03 MED ORDER — SODIUM CHLORIDE 0.45 % IV SOLN
INTRAVENOUS | Status: DC
  Administered 2013-12-03: 15:00:00 via INTRAVENOUS

## 2013-12-03 MED ORDER — MORPHINE SULFATE 2 MG/ML IJ SOLN: 1.0000 mg | INTRAMUSCULAR | Status: AC | PRN

## 2013-12-03 MED ORDER — MILRINONE IN DEXTROSE 20 MG/100ML IV SOLN
INTRAVENOUS | Status: DC | PRN
  Administered 2013-12-03: 0.375 ug/kg/min via INTRAVENOUS

## 2013-12-03 MED ORDER — BISACODYL 10 MG RE SUPP
10.0000 mg | Freq: Every day | RECTAL | Status: DC
  Administered 2013-12-07: 10 mg via RECTAL

## 2013-12-03 MED ORDER — DEXTROSE 5 % IV SOLN
1.5000 g | Freq: Two times a day (BID) | INTRAVENOUS | Status: AC
  Administered 2013-12-03 – 2013-12-05 (×4): 1.5 g via INTRAVENOUS
  Filled 2013-12-03 (×4): qty 1.5

## 2013-12-03 MED ORDER — VANCOMYCIN HCL IN DEXTROSE 1-5 GM/200ML-% IV SOLN
1000.0000 mg | Freq: Once | INTRAVENOUS | Status: AC
  Administered 2013-12-03: 1000 mg via INTRAVENOUS
  Filled 2013-12-03: qty 200

## 2013-12-03 MED ORDER — OXYCODONE HCL 5 MG PO TABS
5.0000 mg | ORAL_TABLET | ORAL | Status: DC | PRN
  Administered 2013-12-04: 10 mg via ORAL
  Administered 2013-12-04: 5 mg via ORAL
  Administered 2013-12-05: 10 mg via ORAL
  Filled 2013-12-03: qty 2
  Filled 2013-12-03: qty 1
  Filled 2013-12-03: qty 2

## 2013-12-03 MED ORDER — MIDAZOLAM HCL 5 MG/5ML IJ SOLN
INTRAMUSCULAR | Status: DC | PRN
  Administered 2013-12-03: 2 mg via INTRAVENOUS
  Administered 2013-12-03 (×2): 3 mg via INTRAVENOUS
  Administered 2013-12-03: 1 mg via INTRAVENOUS
  Administered 2013-12-03: 3 mg via INTRAVENOUS
  Administered 2013-12-03: 2 mg via INTRAVENOUS

## 2013-12-03 MED ORDER — HEPARIN SODIUM (PORCINE) 1000 UNIT/ML IJ SOLN
INTRAMUSCULAR | Status: DC | PRN
  Administered 2013-12-03: 21000 [IU] via INTRAVENOUS

## 2013-12-03 MED ORDER — SODIUM CHLORIDE 0.9 % IJ SOLN
INTRAMUSCULAR | Status: DC | PRN
  Administered 2013-12-03: 09:00:00 via TOPICAL

## 2013-12-03 MED ORDER — MIDAZOLAM HCL 2 MG/2ML IJ SOLN: 2.0000 mg | INTRAMUSCULAR | Status: DC | PRN

## 2013-12-03 MED ORDER — MILRINONE IN DEXTROSE 20 MG/100ML IV SOLN: 0.3000 ug/kg/min | INTRAVENOUS | Status: DC

## 2013-12-03 MED ORDER — MAGNESIUM SULFATE 4000MG/100ML IJ SOLN
4.0000 g | Freq: Once | INTRAMUSCULAR | Status: AC
  Administered 2013-12-03: 4 g via INTRAVENOUS
  Filled 2013-12-03: qty 100

## 2013-12-03 MED ORDER — INSULIN REGULAR BOLUS VIA INFUSION
0.0000 [IU] | Freq: Three times a day (TID) | INTRAVENOUS | Status: DC
  Filled 2013-12-03: qty 10

## 2013-12-03 MED ORDER — SODIUM CHLORIDE 0.9 % IJ SOLN
3.0000 mL | Freq: Two times a day (BID) | INTRAMUSCULAR | Status: DC
  Administered 2013-12-04 – 2013-12-09 (×7): 3 mL via INTRAVENOUS

## 2013-12-03 MED ORDER — PANTOPRAZOLE SODIUM 40 MG PO TBEC
40.0000 mg | DELAYED_RELEASE_TABLET | Freq: Every day | ORAL | Status: DC
  Administered 2013-12-05 – 2013-12-11 (×7): 40 mg via ORAL
  Filled 2013-12-03 (×6): qty 1

## 2013-12-03 MED ORDER — FAMOTIDINE IN NACL 20-0.9 MG/50ML-% IV SOLN
20.0000 mg | Freq: Two times a day (BID) | INTRAVENOUS | Status: DC
  Administered 2013-12-03: 20 mg via INTRAVENOUS

## 2013-12-03 MED ORDER — SODIUM CHLORIDE 0.9 % IJ SOLN
INTRAMUSCULAR | Status: AC
  Filled 2013-12-03: qty 10

## 2013-12-03 MED ORDER — ASPIRIN EC 325 MG PO TBEC
325.0000 mg | DELAYED_RELEASE_TABLET | Freq: Every day | ORAL | Status: DC
  Administered 2013-12-04 – 2013-12-06 (×3): 325 mg via ORAL
  Filled 2013-12-03 (×3): qty 1

## 2013-12-03 SURGICAL SUPPLY — 78 items
ATTRACTOMAT 16X20 MAGNETIC DRP (DRAPES) ×4 IMPLANT
BAG DECANTER FOR FLEXI CONT (MISCELLANEOUS) ×4 IMPLANT
BANDAGE ELASTIC 4 VELCRO ST LF (GAUZE/BANDAGES/DRESSINGS) ×4 IMPLANT
BANDAGE ELASTIC 6 VELCRO ST LF (GAUZE/BANDAGES/DRESSINGS) ×4 IMPLANT
BANDAGE GAUZE ELAST BULKY 4 IN (GAUZE/BANDAGES/DRESSINGS) ×4 IMPLANT
BLADE STERNUM SYSTEM 6 (BLADE) ×4 IMPLANT
BNDG GAUZE ELAST 4 BULKY (GAUZE/BANDAGES/DRESSINGS) ×4 IMPLANT
CANISTER SUCTION 2500CC (MISCELLANEOUS) ×4 IMPLANT
CARDIAC SUCTION (MISCELLANEOUS) ×4 IMPLANT
CATH CPB KIT GERHARDT (MISCELLANEOUS) ×4 IMPLANT
CATH THORACIC 28FR (CATHETERS) ×4 IMPLANT
CLIP FOGARTY SPRING 6M (CLIP) ×4 IMPLANT
CLIP RETRACTION 3.0MM CORONARY (MISCELLANEOUS) ×4 IMPLANT
COVER SURGICAL LIGHT HANDLE (MISCELLANEOUS) ×4 IMPLANT
CRADLE DONUT ADULT HEAD (MISCELLANEOUS) ×4 IMPLANT
DERMABOND ADVANCED (GAUZE/BANDAGES/DRESSINGS) ×3
DERMABOND ADVANCED .7 DNX12 (GAUZE/BANDAGES/DRESSINGS) ×9 IMPLANT
DRAIN CHANNEL 28F RND 3/8 FF (WOUND CARE) ×4 IMPLANT
DRAPE CARDIOVASCULAR INCISE (DRAPES) ×1
DRAPE SLUSH/WARMER DISC (DRAPES) ×4 IMPLANT
DRAPE SRG 135X102X78XABS (DRAPES) ×3 IMPLANT
DRSG AQUACEL AG ADV 3.5X14 (GAUZE/BANDAGES/DRESSINGS) ×4 IMPLANT
ELECT BLADE 4.0 EZ CLEAN MEGAD (MISCELLANEOUS) ×4
ELECT REM PT RETURN 9FT ADLT (ELECTROSURGICAL) ×8
ELECTRODE BLDE 4.0 EZ CLN MEGD (MISCELLANEOUS) ×3 IMPLANT
ELECTRODE REM PT RTRN 9FT ADLT (ELECTROSURGICAL) ×6 IMPLANT
GLOVE BIO SURGEON STRL SZ 6 (GLOVE) ×8 IMPLANT
GLOVE BIO SURGEON STRL SZ 6.5 (GLOVE) ×24 IMPLANT
GLOVE BIOGEL PI IND STRL 6 (GLOVE) ×6 IMPLANT
GLOVE BIOGEL PI IND STRL 6.5 (GLOVE) ×15 IMPLANT
GLOVE BIOGEL PI IND STRL 7.0 (GLOVE) ×6 IMPLANT
GLOVE BIOGEL PI INDICATOR 6 (GLOVE) ×2
GLOVE BIOGEL PI INDICATOR 6.5 (GLOVE) ×5
GLOVE BIOGEL PI INDICATOR 7.0 (GLOVE) ×2
GOWN STRL REUS W/ TWL LRG LVL3 (GOWN DISPOSABLE) ×18 IMPLANT
GOWN STRL REUS W/ TWL XL LVL3 (GOWN DISPOSABLE) ×3 IMPLANT
GOWN STRL REUS W/TWL LRG LVL3 (GOWN DISPOSABLE) ×6
GOWN STRL REUS W/TWL XL LVL3 (GOWN DISPOSABLE) ×1
HEMOSTAT POWDER SURGIFOAM 1G (HEMOSTASIS) ×12 IMPLANT
HEMOSTAT SURGICEL 2X14 (HEMOSTASIS) ×4 IMPLANT
KIT BASIN OR (CUSTOM PROCEDURE TRAY) ×4 IMPLANT
KIT ROOM TURNOVER OR (KITS) ×4 IMPLANT
KIT SUCTION CATH 14FR (SUCTIONS) ×8 IMPLANT
KIT VASOVIEW W/TROCAR VH 2000 (KITS) ×4 IMPLANT
LEAD PACING MYOCARDI (MISCELLANEOUS) ×4 IMPLANT
MARKER GRAFT CORONARY BYPASS (MISCELLANEOUS) ×12 IMPLANT
NS IRRIG 1000ML POUR BTL (IV SOLUTION) ×28 IMPLANT
PACK OPEN HEART (CUSTOM PROCEDURE TRAY) ×4 IMPLANT
PAD ARMBOARD 7.5X6 YLW CONV (MISCELLANEOUS) ×8 IMPLANT
PAD ELECT DEFIB RADIOL ZOLL (MISCELLANEOUS) ×4 IMPLANT
PATCH TACHOSII LRG 9.5X4.8 (VASCULAR PRODUCTS) ×8 IMPLANT
PENCIL BUTTON HOLSTER BLD 10FT (ELECTRODE) ×4 IMPLANT
PUNCH AORTIC ROTATE  4.5MM 8IN (MISCELLANEOUS) ×4 IMPLANT
SET CARDIOPLEGIA MPS 5001102 (MISCELLANEOUS) ×4 IMPLANT
SPONGE GAUZE 4X4 12PLY (GAUZE/BANDAGES/DRESSINGS) ×8 IMPLANT
SPONGE GAUZE 4X4 12PLY STER LF (GAUZE/BANDAGES/DRESSINGS) ×8 IMPLANT
SPONGE LAP 18X18 X RAY DECT (DISPOSABLE) ×12 IMPLANT
SURGIFLO W/THROMBIN 8M KIT (HEMOSTASIS) ×4 IMPLANT
SUT BONE WAX W31G (SUTURE) ×4 IMPLANT
SUT PROLENE 3 0 SH1 36 (SUTURE) ×8 IMPLANT
SUT PROLENE 4 0 TF (SUTURE) ×8 IMPLANT
SUT PROLENE 6 0 CC (SUTURE) ×20 IMPLANT
SUT PROLENE 7 0 BV1 MDA (SUTURE) ×12 IMPLANT
SUT PROLENE 8 0 BV175 6 (SUTURE) ×12 IMPLANT
SUT STEEL 6MS V (SUTURE) ×4 IMPLANT
SUT STEEL SZ 6 DBL 3X14 BALL (SUTURE) ×4 IMPLANT
SUT VIC AB 1 CTX 18 (SUTURE) ×8 IMPLANT
SUTURE E-PAK OPEN HEART (SUTURE) ×4 IMPLANT
SYSTEM SAHARA CHEST DRAIN ATS (WOUND CARE) ×4 IMPLANT
TAPE CLOTH SURG 4X10 WHT LF (GAUZE/BANDAGES/DRESSINGS) ×4 IMPLANT
TAPE PAPER 2X10 WHT MICROPORE (GAUZE/BANDAGES/DRESSINGS) ×4 IMPLANT
TOWEL OR 17X24 6PK STRL BLUE (TOWEL DISPOSABLE) ×8 IMPLANT
TOWEL OR 17X26 10 PK STRL BLUE (TOWEL DISPOSABLE) ×8 IMPLANT
TRAY FOLEY IC TEMP SENS 16FR (CATHETERS) ×4 IMPLANT
TUBE FEEDING 8FR 16IN STR KANG (MISCELLANEOUS) ×4 IMPLANT
TUBING INSUFFLATION 10FT LAP (TUBING) ×4 IMPLANT
UNDERPAD 30X30 INCONTINENT (UNDERPADS AND DIAPERS) ×4 IMPLANT
WATER STERILE IRR 1000ML POUR (IV SOLUTION) ×8 IMPLANT

## 2013-12-03 NOTE — Progress Notes (Signed)
  Echocardiogram Echocardiogram Transesophageal has been performed.  Georgian CoWILLIAMS, Mckinze Poirier 12/03/2013, 9:40 AM

## 2013-12-03 NOTE — Progress Notes (Signed)
Patient reports CP this morning at 03:00 am after getting anxious that lasted for 5- 10 minutes relieved with NTG x 3. BP as noted patient reports that he has not taken Metoprolol. Spoke to Dr. Ladene ArtistHodiern and notified him of the the findings will give home dose of Metoprolol per protocol and continue with prep.

## 2013-12-03 NOTE — Brief Op Note (Addendum)
      [address]       [place] [id number]             [phone]     12/03/2013  1:44 PM  PATIENT:  Ray ChurchBilly Wyffels  80 y.o. male  PRE-OPERATIVE DIAGNOSIS:  CAD  POST-OPERATIVE DIAGNOSIS:  CAD  PROCEDURE:  Procedure(s): CORONARY ARTERY BYPASS GRAFTING (CABG) x4 using left internal mammary artery and right greater saphenous vein. LIMA to LAD, sequential SVG to OM 1 & OM 2, SVG to PD INTRAOPERATIVE TRANSESOPHAGEAL ECHOCARDIOGRAM ENDOVEIN HARVEST OF GREATER SAPHENOUS VEIN  SURGEON:  Surgeon(s): Delight OvensEdward B Kamea Dacosta, MD  PHYSICIAN ASSISTANT: WAYNE GOLD PA-C  ANESTHESIA:   general  PATIENT CONDITION:  ICU - intubated and hemodynamically stable.  PRE-OPERATIVE WEIGHT: 69kg  COMPLICATIONS: NO KNOWN  Left chest tube, mediastinal tube foley, aline

## 2013-12-03 NOTE — Transfer of Care (Signed)
Immediate Anesthesia Transfer of Care Note  Patient: Edgar Thomas  Procedure(s) Performed: Procedure(s): CORONARY ARTERY BYPASS GRAFTING (CABG) x4 using left internal mammary artery and right greater saphenous vein. LIMA to LAD, sequential SVG to OM 1 & OM 2, SVG to PD (N/A) INTRAOPERATIVE TRANSESOPHAGEAL ECHOCARDIOGRAM (N/A) ENDOVEIN HARVEST OF GREATER SAPHENOUS VEIN (Right)  Patient Location: SICU  Anesthesia Type:General  Level of Consciousness: Patient remains intubated per anesthesia plan  Airway & Oxygen Therapy: Patient remains intubated per anesthesia plan and Patient placed on Ventilator (see vital sign flow sheet for setting)  Post-op Assessment: Report given to PACU RN and Post -op Vital signs reviewed and stable  Post vital signs: Reviewed and stable  Complications: No apparent anesthesia complications

## 2013-12-03 NOTE — H&P (Signed)
[address]       [place] [id number]             [phone]                    [facility] Medical Record #[id number] Date of Birth: 09/14/1929  ReferringAccident: No ref. provider found Primary Care: Konrad FelixHOMAS,BRAD, MD  Chief Complaint:    CAD, unstable angina   History of Present Illness:    Edgar Thomas 80 y.o. male is seen in the office  at the patient's request for coronary artery disease. The patient has a long history of coronary artery disease, he first had a myocardial infarction [facility] by Dr. Theron AristaPeter with angioplasty of the LAD 23 years ago. 19 years ago he had a repeat angioplasty of distal right coronary artery branch. Since that time he's done relatively well with changes of lifestyle and diet through a wellness program in [place]. One week ago while home he was awakened by chest discomfort and because of her concern of a long wait in [facility] [place] himself [place]. [facility]. Echocardiogram and cardiac catheterization were performed on 11/17/2013 dr  Rolene ArbourHAKAS. The patient notes that he saw a surgeon in the hospital but decided to delay in making a decision and was discharged home. He comes to the office today for a second opinion because I had previously operated on his son. Should be noted in his cardiology consultation that he presented with episode of atrial fibrillation troponin elevation to 15. Since discharge home he is remained inactive. He note brief episode of chest discomfort anxiety 3am , resolved after ntg single   Echocardiogram showed left ventricular size mildly dilated ejection fraction 40-45% mild mitral regurgitation mild aortic regurgitation Cardiac catheterization: Shows diffuse disease of at least 50% left main LAD has a 99% stenosis at the ostium circumflex is a medium caliber nondominant vessel with a 90% ostial stenosis and a second 90% stenosis in the mid  circumflex the right coronary artery is heavily calcified 80% proximal PDA stenosis the right supplies collaterals to the left anterior descending   Current Activity/ Functional Status:  Patient is independent with mobility/ambulation, transfers, ADL's, IADL's.   Zubrod Score: At the time of surgery this patient's most appropriate activity status/level should be described as: []     0    Normal activity, no symptoms [x]     1    Restricted in physical strenuous activity but ambulatory, able to do out light work []     2    Ambulatory and capable of self care, unable to do work activities, up and about               >50 % of waking hours                              []     3    Only limited self care, in bed greater than 50% of waking hours []     4    Completely disabled, no self care, confined to bed or chair []     5    Moribund   Past Medical History  Diagnosis Date  . Hyperlipemia   . CAD (coronary artery disease)   . Heartburn   . A-fib   . Hypertension   . Heart attack   .  Constipation   . GERD (gastroesophageal reflux disease)     Past Surgical History  Procedure Laterality Date  . Balloon angioplasty of lad    . Colonoscopy      Family History  Problem Relation Age of Onset  . Heart disease Mother     died at age 78 of heart attack    History   Social History  . Marital Status: Married    Spouse Name: N/A    Number of Children: 6  . 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 Smoking status: Never Smoker   . Smokeless tobacco: Never Used  . Alcohol Use: No  . Drug Use: No  . Sexual Activity: Not on file   Other Topics Concern  . Not on file   Social History Narrative  . No narrative on file    History  Smoking status  . Never Smoker   Smokeless tobacco  . Never Used    History  Alcohol Use No     Allergies  Allergen Reactions  . Contrast Media [Iodinated Diagnostic Agents] Rash    [facility]    Current Facility-Administered Medications  Medication Dose Route Frequency Provider Last Rate Last Dose  . aminocaproic acid (AMICAR) 10 g in sodium chloride 0.9 % 100 mL infusion   Intravenous To OR Delight OvensEdward B Syvilla Martin, MD      . cefUROXime (ZINACEF) 1.5 g in dextrose 5 % 50 mL IVPB  1.5 g Intravenous To OR Delight OvensEdward B Joanette Silveria, MD      . cefUROXime (ZINACEF) 750 mg in dextrose 5 % 50 mL IVPB  750 mg Intravenous To OR Delight OvensEdward B Karita Dralle, MD      . chlorhexidine (HIBICLENS) 4 % liquid 2 application  30 mL Topical UD Delight OvensEdward B Margee Trentham, MD      . dexmedetomidine (PRECEDEX) 400 MCG/100ML infusion  0.1-0.7 mcg/kg/hr Intravenous To OR Delight OvensEdward B Shadiyah Wernli, MD      . DOPamine (INTROPIN) 800 mg in dextrose 5 % 250 mL infusion  2-20 mcg/kg/min Intravenous To OR Delight OvensEdward B Analia Zuk, MD      . EPINEPHrine (ADRENALIN) 4,000 mcg in dextrose 5 % 250 mL infusion  0.5-20 mcg/min Intravenous To OR Delight OvensEdward B Orlandis Sanden, MD      . heparin 2,500 Units, papaverine 30 mg in electrolyte-148 (PLASMALYTE-148) 500 mL irrigation   Irrigation To OR Delight OvensEdward B Davena Julian, MD      . heparin 30,000 units/NS 1000 mL solution for CELLSAVER   Other To OR Delight OvensEdward B Keanan Melander, MD      . insulin regular (NOVOLIN R,HUMULIN R) 1 Units/mL in sodium chloride 0.9 % 100 mL infusion   Intravenous To OR Delight OvensEdward B Kentrail Shew, MD      . magnesium sulfate (IV Push/IM) injection 40 mEq  40 mEq Other To OR Delight OvensEdward B Madden Garron, MD      . metoprolol tartrate (LOPRESSOR) tablet 12.5 mg  12.5 mg Oral Once Delight OvensEdward B Florencio Hollibaugh, MD      . nitroGLYCERIN 0.2 mg/mL in dextrose 5 % infusion  2-200 mcg/min Intravenous To OR Delight OvensEdward B Armour Villanueva, MD      . phenylephrine (NEO-SYNEPHRINE) 20 mg in dextrose 5 % 250 mL infusion  30-200 mcg/min Intravenous To OR Delight OvensEdward B Jacklynn Dehaas, MD      . potassium chloride injection 80 mEq  80 mEq Other To OR Delight OvensEdward B Buddie Marston, MD      .  vancomycin (VANCOCIN) 1,250 mg in sodium chloride 0.9 % 250 mL IVPB  1,250 mg  Intravenous To OR Delight Ovens, MD         Review of Systems:     Cardiac Review of Systems: Y or N  Chest Pain [ y  ]  Resting SOB [n   ] Exertional SOB  [ n ]  Myer Peer  ]   Pedal Edema [n   ]    Palpitations Milo.Brash  ] Syncope  n[  ]   Presyncope [ n  ]  General Review of Systems: [Y] = yes [  ]=no Constitional: recent weight change [ n ];  Wt loss over the last 3 months [   ] anorexia [  ]; fatigue [  ]; nausea [  ]; night sweats [  ]; fever [  ]; or chills [  ];          Dental: poor dentition[  n]; Last Dentist visit:   Eye : blurred vision [  ]; diplopia [   ]; vision changes [  ];  Amaurosis fugax[  ]; Resp: cough [  ];  wheezing[  ];  hemoptysis[  ]; shortness of breath[  ]; paroxysmal nocturnal dyspnea[  ]; dyspnea on exertion[  ]; or orthopnea[  ];  GI:  gallstones[  ], vomitingn[  ];  dysphagia[  ]; melena[ n ];  hematochezia [  ]; heartburn[ y ];   Hx of  Colonoscopy[  ]; GU: kidney stones [  ]; hematuria[  ];   dysuria [  ];  nocturia[  ];  history of     obstruction [  ]; urinary frequency [  ]             Skin: rash, swelling[  ];, hair loss[  ];  peripheral edema[  ];  or itching[  ]; Musculosketetal: myalgias[  ];  joint swelling[  ];  joint erythema[  ];  joint pain[  ];  back pain[  ];  Heme/Lymph: bruising[  ];  bleeding[  ];  anemia[  ];  Neuro: TIA[n  ];  headaches[  ];  stroke[n  ];  vertigo[  ];  seizures[  ];   paresthesias[  ];  difficulty walking[ n ];  Psych:depression[n  ]; anxiety[  n];  Endocrine: diabetes[n  ];  thyroid dysfunction[n  ];  Immunizations: Flu up to date Milo.Brash  ]; Pneumococcal up to date Milo.Brash  ];  Other:  Physical Exam: BP 210/105  Pulse 83  Temp(Src) 98.2 F (36.8 C) (Oral)  Resp 20  Wt 154 lb (69.854 kg)  SpO2 98%  PHYSICAL EXAMINATION:  General appearance: alert and cooperative Neurologic: intact Heart: regular rate and rhythm, S1, S2 normal, no murmur, click, rub or gallop Lungs: clear to auscultation bilaterally Abdomen: soft,  non-tender; bowel sounds normal; no masses,  no organomegaly Extremities: extremities normal, atraumatic, no cyanosis or edema and Homans sign is negative, no sign of DVT Patient has no carotid bruits, does not appear to be in atrial fibrillation on this exam   Diagnostic Studies & Laboratory data:     Recent Radiology Findings:  Dg Chest 2 View  12/01/2013   CLINICAL DATA:  Pre Cardiac Surgery  EXAM: CHEST  2 VIEW  COMPARISON:  None.  FINDINGS: The heart size and mediastinal contours are within normal limits. Aorta is tortuous. Both lungs are clear. The visualized skeletal structures are unremarkable.  IMPRESSION: No active cardiopulmonary disease.  Electronically Signed   By: Salome Holmes M.D.   On: 12/01/2013 14:56      Recent Lab Findings: Lab Results  Component Value Date   WBC 8.1 12/01/2013   HGB 15.3 12/01/2013   HCT 45.1 12/01/2013   PLT 139* 12/01/2013   GLUCOSE 95 12/01/2013   ALT 33 12/01/2013   AST 36 12/01/2013   NA 138 12/01/2013   K 4.2 12/01/2013   CL 102 12/01/2013   CREATININE 0.65 12/01/2013   BUN 21 12/01/2013   CO2 20 12/01/2013   INR 1.10 12/01/2013   HGBA1C 5.8* 12/01/2013   Lab Results  Component Value Date   WBC 8.1 12/01/2013      Assessment / Plan:   I have reviewed the patient's history exam and cardiac cath films from [facility]. I agree with the recommendation made there with the patient's severe three-vessel coronary artery disease he will be best served with coronary artery bypass grafting. I've offered the patient to proceed quickly with this early next week. He notes that he has not decided he wants to have bypass surgery at this point and would like to think about it over the weekend. I made it clear posterior he and his wife the nature of his high-grade lesions put him at extremely high risk of acute events. The risks and options of surgery were discussed in detail, as were the risks of delaying treatment.    The goals risks and alternatives of the  planned surgical procedure CABG  have been discussed with the patient in detail. The risks of the procedure including death, infection, stroke, myocardial infarction, bleeding, blood transfusion have all been discussed specifically.  I have quoted Edgar Thomas a 4 % of perioperative mortality and a complication rate as high as 20 %. The patient's questions have been answered.Edgar Thomas is willing  to proceed with the planned procedure.     Delight Ovens MD      [address] [place] [place] [address] Office [phone]   Beeper [phone]  12/03/2013 7:30 AM

## 2013-12-03 NOTE — Procedures (Signed)
Extubation Procedure Note  Patient Details:   Name: Edgar ChurchBilly Clemenson DOB: 08/31/1929 MRN: [id number]   Airway Documentation:  Airway 8 mm (Active)  Secured at (cm) 22 cm 12/03/2013  7:52 PM  Measured From Lips 12/03/2013  7:52 PM  Secured Location Right 12/03/2013  7:52 PM  Secured By Pink Tape 12/03/2013  7:52 PM  Site Condition Dry 12/03/2013  7:52 PM    Evaluation  O2 sats: stable throughout Complications: No apparent complications Patient did tolerate procedure well. Bilateral Breath Sounds: Clear   Yes  Pt. Was extubated to a 4L [place] without any complications, dyspnea or stridor noted. Pt. Achieved a goal on VC of 1.3L & -26 on NIF. Pt. Was instructed on IS x 5, highest goal achieved was 600mL.   Tykia Mellone, Margaretmary Dysshley L 12/03/2013, 20:30 PM

## 2013-12-03 NOTE — Anesthesia Procedure Notes (Signed)
Procedure Name: Intubation Date/Time: 12/03/2013 8:42 AM Performed by: Carmela RimaMARTINELLI, Nickolaus Bordelon F Pre-anesthesia Checklist: Patient identified, Timeout performed, Emergency Drugs available, Patient being monitored and Suction available Patient Re-evaluated:Patient Re-evaluated prior to inductionOxygen Delivery Method: Circle system utilized Preoxygenation: Pre-oxygenation with 100% oxygen Intubation Type: IV induction Ventilation: Mask ventilation without difficulty Laryngoscope Size: Mac and 3 Grade View: Grade I Tube type: Oral Number of attempts: 1 Placement Confirmation: positive ETCO2,  ETT inserted through vocal cords under direct vision and breath sounds checked- equal and bilateral Secured at: 24 cm Tube secured with: Tape Dental Injury: Teeth and Oropharynx as per pre-operative assessment

## 2013-12-03 NOTE — Anesthesia Preprocedure Evaluation (Addendum)
Anesthesia Evaluation  Patient identified by MRN, date of birth, ID band Patient awake    Reviewed: Allergy & Precautions, H&P , NPO status , Patient's Chart, lab work & pertinent test results  Airway Mallampati: II      Dental  (+) Teeth Intact, Dental Advidsory Given, Implants, Caps   Pulmonary neg pulmonary ROS,  breath sounds clear to auscultation        Cardiovascular hypertension, + CAD and + Past MI Rhythm:Regular Rate:Normal     Neuro/Psych    GI/Hepatic Neg liver ROS, GERD-  ,  Endo/Other    Renal/GU negative Renal ROS     Musculoskeletal   Abdominal   Peds  Hematology   Anesthesia Other Findings   Reproductive/Obstetrics                         Anesthesia Physical Anesthesia Plan  ASA: IV  Anesthesia Plan: General   Post-op Pain Management:    Induction: Intravenous  Airway Management Planned: Oral ETT  Additional Equipment: Arterial line, PA Cath and TEE  Intra-op Plan:   Post-operative Plan: Post-operative intubation/ventilation  Informed Consent: I have reviewed the patients History and Physical, chart, labs and discussed the procedure including the risks, benefits and alternatives for the proposed anesthesia with the patient or authorized representative who has indicated his/her understanding and acceptance.   Dental advisory given and Dental Advisory Given  Plan Discussed with: CRNA, Anesthesiologist and Surgeon  Anesthesia Plan Comments:       Anesthesia Quick Evaluation

## 2013-12-03 NOTE — Anesthesia Postprocedure Evaluation (Signed)
  Anesthesia Post-op Note  Patient: Edgar Thomas  Procedure(s) Performed: Procedure(s): CORONARY ARTERY BYPASS GRAFTING (CABG) x4 using left internal mammary artery and right greater saphenous vein. LIMA to LAD, sequential SVG to OM 1 & OM 2, SVG to PD (N/A) INTRAOPERATIVE TRANSESOPHAGEAL ECHOCARDIOGRAM (N/A) ENDOVEIN HARVEST OF GREATER SAPHENOUS VEIN (Right)  Patient Location: PACU  Anesthesia Type:General  Level of Consciousness: awake  Airway and Oxygen Therapy: Patient Spontanous Breathing  Post-op Pain: mild  Post-op Assessment: Post-op Vital signs reviewed  Post-op Vital Signs: Reviewed  Last Vitals:  Filed Vitals:   12/03/13 1615  BP:   Pulse: 90  Temp: 36.4 C  Resp: 12    Complications: No apparent anesthesia complications

## 2013-12-03 NOTE — Progress Notes (Signed)
TCTS BRIEF SICU PROGRESS NOTE  Day of Surgery  S/P Procedure(s) (LRB): CORONARY ARTERY BYPASS GRAFTING (CABG) x4 using left internal mammary artery and right greater saphenous vein. LIMA to LAD, sequential SVG to OM 1 & OM 2, SVG to PD (N/A) INTRAOPERATIVE TRANSESOPHAGEAL ECHOCARDIOGRAM (N/A) ENDOVEIN HARVEST OF GREATER SAPHENOUS VEIN (Right)   Sedated on vent AAI paced w/ stable hemodynamics on low dose milrinone and Neo drips Chest tube output low UOP adequate Labs okay  Plan: Continue routine early postop  Edgar Thomas 12/03/2013 4:53 PM

## 2013-12-04 ENCOUNTER — Inpatient Hospital Stay (HOSPITAL_COMMUNITY): Payer: Medicare Other

## 2013-12-04 ENCOUNTER — Encounter (HOSPITAL_COMMUNITY): Payer: Self-pay | Admitting: Cardiothoracic Surgery

## 2013-12-04 LAB — BASIC METABOLIC PANEL
ANION GAP: 13 (ref 5–15)
BUN: 12 mg/dL (ref 6–23)
CALCIUM: 7.6 mg/dL — AB (ref 8.4–10.5)
CO2: 21 meq/L (ref 19–32)
CREATININE: 0.69 mg/dL (ref 0.50–1.35)
Chloride: 106 mEq/L (ref 96–112)
GFR calc Af Amer: >90 mL/min (ref >90)
GFR, EST NON AFRICAN AMERICAN: 87 mL/min — AB (ref >90)
GLUCOSE: 142 mg/dL — AB (ref 70–99)
Potassium: 4.2 mEq/L (ref 3.7–5.3)
Sodium: 140 mEq/L (ref 137–147)

## 2013-12-04 LAB — MAGNESIUM
MAGNESIUM: 2.5 mg/dL (ref 1.5–2.5)
MAGNESIUM: 2.7 mg/dL — AB (ref 1.5–2.5)

## 2013-12-04 LAB — POCT I-STAT, CHEM 8
BUN: 17 mg/dL (ref 6–23)
Calcium, Ion: 1.2 mmol/L (ref 1.13–1.30)
Chloride: 102 mEq/L (ref 96–112)
Creatinine, Ser: 1 mg/dL (ref 0.50–1.35)
Glucose, Bld: 159 mg/dL — ABNORMAL HIGH (ref 70–99)
HCT: 33 % — ABNORMAL LOW (ref 39.0–52.0)
Hemoglobin: 11.2 g/dL — ABNORMAL LOW (ref 13.0–17.0)
Potassium: 4 mEq/L (ref 3.7–5.3)
Sodium: 141 mEq/L (ref 137–147)
TCO2: 21 mmol/L (ref 0–100)

## 2013-12-04 LAB — GLUCOSE, CAPILLARY
Glucose-Capillary: 144 mg/dL — ABNORMAL HIGH (ref 70–99)
Glucose-Capillary: 124 mg/dL — ABNORMAL HIGH (ref 70–99)
Glucose-Capillary: 178 mg/dL — ABNORMAL HIGH (ref 70–99)
Glucose-Capillary: 158 mg/dL — ABNORMAL HIGH (ref 70–99)
Glucose-Capillary: 139 mg/dL — ABNORMAL HIGH (ref 70–99)
Glucose-Capillary: 130 mg/dL — ABNORMAL HIGH (ref 70–99)

## 2013-12-04 LAB — CREATININE, SERUM
CREATININE: 0.99 mg/dL (ref 0.50–1.35)
GFR calc non Af Amer: 75 mL/min — ABNORMAL LOW (ref >90)
GFR, EST AFRICAN AMERICAN: 87 mL/min — AB (ref >90)

## 2013-12-04 LAB — CBC
HCT: 26.3 % — ABNORMAL LOW (ref 39.0–52.0)
HCT: 28.2 % — ABNORMAL LOW (ref 39.0–52.0)
HEMOGLOBIN: 9.7 g/dL — AB (ref 13.0–17.0)
Hemoglobin: 8.8 g/dL — ABNORMAL LOW (ref 13.0–17.0)
MCH: 32.2 pg (ref 26.0–34.0)
MCH: 33 pg (ref 26.0–34.0)
MCHC: 33.5 g/dL (ref 30.0–36.0)
MCHC: 34.4 g/dL (ref 30.0–36.0)
MCV: 96.3 fL (ref 78.0–100.0)
MCV: 95.9 fL (ref 78.0–100.0)
PLATELETS: 63 10*3/uL — AB (ref 150–400)
Platelets: 79 10*3/uL — ABNORMAL LOW (ref 150–400)
RBC: 2.73 MIL/uL — ABNORMAL LOW (ref 4.22–5.81)
RBC: 2.94 MIL/uL — AB (ref 4.22–5.81)
RDW: 14.1 % (ref 11.5–15.5)
RDW: 14.3 % (ref 11.5–15.5)
WBC: 9.3 10*3/uL (ref 4.0–10.5)
WBC: 13.3 10*3/uL — AB (ref 4.0–10.5)

## 2013-12-04 LAB — PREPARE PLATELET PHERESIS: Unit division: 0

## 2013-12-04 LAB — TYPE AND SCREEN
ABO/RH(D): O POS
Antibody Screen: NEGATIVE

## 2013-12-04 LAB — HEMOGLOBIN AND HEMATOCRIT, BLOOD
HCT: 27.5 % — ABNORMAL LOW (ref 39.0–52.0)
Hemoglobin: 9.4 g/dL — ABNORMAL LOW (ref 13.0–17.0)

## 2013-12-04 MED ORDER — AMIODARONE HCL IN DEXTROSE 360-4.14 MG/200ML-% IV SOLN
30.0000 mg/h | INTRAVENOUS | Status: DC
  Administered 2013-12-04 – 2013-12-06 (×2): 30 mg/h via INTRAVENOUS
  Filled 2013-12-04 (×8): qty 200

## 2013-12-04 MED ORDER — AMIODARONE HCL IN DEXTROSE 360-4.14 MG/200ML-% IV SOLN
60.0000 mg/h | INTRAVENOUS | Status: AC
  Administered 2013-12-04: 60 mg/h via INTRAVENOUS
  Filled 2013-12-04: qty 200

## 2013-12-04 MED ORDER — METOCLOPRAMIDE HCL 5 MG/ML IJ SOLN
10.0000 mg | Freq: Four times a day (QID) | INTRAMUSCULAR | Status: AC
  Administered 2013-12-04 – 2013-12-05 (×4): 10 mg via INTRAVENOUS
  Filled 2013-12-04 (×4): qty 2

## 2013-12-04 MED ORDER — AMIODARONE HCL IN DEXTROSE 360-4.14 MG/200ML-% IV SOLN
INTRAVENOUS | Status: AC
  Filled 2013-12-04: qty 200

## 2013-12-04 MED ORDER — AMIODARONE LOAD VIA INFUSION
150.0000 mg | Freq: Once | INTRAVENOUS | Status: AC
  Administered 2013-12-04: 150 mg via INTRAVENOUS
  Filled 2013-12-04: qty 83.34

## 2013-12-04 MED FILL — Heparin Sodium (Porcine) Inj 1000 Unit/ML: INTRAMUSCULAR | Qty: 30 | Status: AC

## 2013-12-04 MED FILL — Potassium Chloride Inj 2 mEq/ML: INTRAVENOUS | Qty: 40 | Status: AC

## 2013-12-04 MED FILL — Electrolyte-R (PH 7.4) Solution: INTRAVENOUS | Qty: 5000 | Status: AC

## 2013-12-04 MED FILL — Sodium Chloride IV Soln 0.9%: INTRAVENOUS | Qty: 2000 | Status: AC

## 2013-12-04 MED FILL — Lidocaine HCl IV Inj 20 MG/ML: INTRAVENOUS | Qty: 10 | Status: AC

## 2013-12-04 MED FILL — Sodium Bicarbonate IV Soln 8.4%: INTRAVENOUS | Qty: 50 | Status: AC

## 2013-12-04 MED FILL — Heparin Sodium (Porcine) Inj 1000 Unit/ML: INTRAMUSCULAR | Qty: 10 | Status: AC

## 2013-12-04 MED FILL — Mannitol IV Soln 20%: INTRAVENOUS | Qty: 500 | Status: AC

## 2013-12-04 MED FILL — Magnesium Sulfate Inj 50%: INTRAMUSCULAR | Qty: 10 | Status: AC

## 2013-12-04 NOTE — Consult Note (Signed)
  Amiodarone Drug - Drug Interaction Consult Note  Assessment/Recommendations:  Amiodarone is metabolized by the cytochrome P450 system and therefore has the potential to cause many drug interactions. Amiodarone has an average plasma half-life of 50 days (range 20 to 100 days).   There is potential for drug interactions to occur several weeks or months after stopping treatment and the onset of drug interactions may be slow after initiating amiodarone.   [x]  Statins: Increased risk of myopathy. Simvastatin- restrict dose to 20mg  daily. Other statins: counsel patients to report any muscle pain or weakness immediately.  [x]  Anticoagulants: Amiodarone can increase anticoagulant effect. If Warfarin started, consider warfarin dose reduction. Patients should be monitored closely and the dose of anticoagulant altered accordingly, remembering that amiodarone levels take several weeks to stabilize.  []  Antiepileptics: Amiodarone can increase plasma concentration of phenytoin, the dose should be reduced. Note that small changes in phenytoin dose can result in large changes in levels. Monitor patient and counsel on signs of toxicity.  [x]  Beta blockers: increased risk of bradycardia, AV block and myocardial depression. Sotalol - avoid concomitant use.  []   Calcium channel blockers (diltiazem and verapamil): increased risk of bradycardia, AV block and myocardial depression.  []   Cyclosporine: Amiodarone increases levels of cyclosporine. Reduced dose of cyclosporine is recommended.  []  Digoxin dose should be halved when amiodarone is started.  []  Diuretics: increased risk of cardiotoxicity if hypokalemia occurs.  []  Oral hypoglycemic agents (glyburide, glipizide, glimepiride): increased risk of hypoglycemia. Patient's glucose levels should be monitored closely when initiating amiodarone therapy.   []  Drugs that prolong the QT interval:  Torsades de pointes risk may be increased with concurrent use -  avoid if possible.  Monitor QTc, also keep magnesium/potassium WNL if concurrent therapy can't be avoided. Marland Kitchen. Antibiotics: e.g. fluoroquinolones, erythromycin. . Antiarrhythmics: e.g. quinidine, procainamide, disopyramide, sotalol. . Antipsychotics: e.g. phenothiazines, haloperidol.  . Lithium, tricyclic antidepressants, and methadone. Thank You,  Velda ShellStramoski, Jodel Mayhall J  12/04/2013 8:37 AM  NOTE:  Ondansetron also known to increase QT interval.  Recommend cautious use and review QTc as indicated.  Laprecious Austill, Elisha HeadlandEarle J,  Pharm.D.

## 2013-12-04 NOTE — Op Note (Signed)
NAMDan Maker:  Edgar Thomas, Edgar Thomas                ACCOUNT NO.:  [id number]  MEDICAL RECORD NO.:  [id number]  LOCATION:  2S04C                        FACILITY:  [facility]  PHYSICIAN:  Sheliah PlaneEdward Shemaiah Round, MD    DATE OF BIRTH:  1930/03/13  DATE OF PROCEDURE:  12/03/2013                               OPERATIVE REPORT  Pre OPERATIVE DIAGNOSIS: Coronary occlusive disease with recent  subendocardial myocardial infarction.   Post OPERATIVE DIAGNOSIS: Coronary occlusive disease with recent  subendocardial myocardial infarction.   SURGICAL PROCEDURE: Coronary artery bypass grafting x4 with the left  internal mammary to left anterior descending coronary artery, sequential  reverse saphenous vein graft to the first and second obtuse marginal,  reverse saphenous vein graft to the posterior descending with right  thigh and calf endo vein harvesting.   SURGEON: Sheliah PlaneEdward Tamiah Dysart, MD.   FIRST ASSISTANT: Rowe ClackWayne E. Gold, PA.   BRIEF HISTORY: The patient is an 80 year old male with known coronary  occlusive disease for many years, who has had previous angioplasties  more than 10 years ago. He had been stable until recently when he had  episode of prolonged chest pain at home and went to [place], and was  ad[facility]. At that time, troponins were noted  to be elevated. Cardiac catheterization was performed, which  demonstrated subtotal occlusion of the LAD with collateral filling from  the right with a high-grade proximal posterior descending lesion,  jeopardizing collaterals. In addition, he had left main disease of 80%-  90% stenoses of the first and second obtuse marginals. He had subtotal  occlusion of the distal right coronary artery supplying posterior  lateral branch that was small. Coronary artery bypass grafting was  recommended to the patient, however, he was discharged home, and [facility]. With the patient's symptoms and  critical 3-vessel disease,  coronary artery bypass grafting was  recommended to the patient.   The patient initially declined urgent coronary artery bypass grafting and wanted to wait until December 03, 2013.  Risks and options of surgery were discussed with he and his wife in detail  including the risk of death, infection, stroke, myocardial infarction, bleeding, blood transfusion.  The patient had stable renal function.  He ultimately agreed to proceeding with coronary artery bypass grafting.  DESCRIPTION OF PROCEDURE:  With Swan-Ganz and arterial line monitors in place, the patient underwent general endotracheal anesthesia without incidence.  Skin of the chest and legs was prepped with Betadine and draped in usual sterile manner.  Dr. Randa EvensEdwards placed a TEE probe which showed very trace aortic insufficiency.  Overall preserved LV function with ejection fraction estimated at 45.  After appropriate time-out, the skin of the chest and legs was prepped with Betadine and draped in sterile manner.  Using the [employer] endo vein harvesting system, vein was harvested from the right thigh and calf and was of good quality and caliber.  Median sternotomy was performed.  Left internal mammary artery was dissected down as a pedicle graft.  The distal artery was divided and had excellent free flow.  Pericardium was opened.  Overall ventricular function appeared intact.  The patient was systemically heparinized.  Ascending aorta was cannulated.  The right atrium was cannulated and aortic root vent cardioplegia needle was introduced into the ascending aorta.  The patient was placed on cardiopulmonary bypass 2.4 L/min/m2.  Sites of anastomosis were inspected and dissected out of the epicardium.  The patient's body temperature was cooled to 32 degrees.  Aortic crossclamp was applied and 500 mL of cold blood potassium cardioplegia was administered with diastolic arrest of the heart.  Myocardial septal temperature was monitored throughout  the crossclamp.  Attention was turned first to the proximal posterior descending coronary artery.  The distal right coronary artery and the proximal posterior descending coronary artery were highly calcified. The posterior descending coronary artery was opened, admitted a 1.5 mm probe distally.  Using a running 7-0 Prolene, distal anastomosis was performed.  Additional cold blood cardioplegia was administered intermittently down the vein bypasses.  The heart was then elevated. The first, second and third obtuse marginals were identified.  The first and second obtuse marginals were suitable size for bypass.  The third obtuse marginal was very small as was a faint collateral branch of the distal circumflex and not felt to be bypassable.  The first obtuse marginal was opened and a side-to-side anastomosis was carried out with a running 7-0 Prolene.  The vessel itself admitted a 1.5-mm probe.  The distal extent of the same vein was then carried to the second obtuse marginal, which was of similar size and of good quality.  Using a running 7-0 Prolene, distal anastomosis was performed.  Attention was then turned to the left anterior descending coronary artery which was intramyocardial and after dissecting it out of its intramyocardial position, the vessel was opened and admitted a 1.5-mm probe distally. Using a running 8-0 Prolene, left internal mammary artery was anastomosed to left anterior descending coronary artery.  With release of the bulldog on the mammary artery, there was rise in myocardial septal temperature.  Bulldog was placed back on the mammary artery. Fascia tacked to the epicardium.  Additional cold blood cardioplegia was administered down the vein grafts.  With the cross-clamp still in place, 2 punch aortotomies were performed and each of the two vein grafts were anastomosed to the ascending aorta.  The heart was allowed to passively fill and de-air.  The aortic cross-clamp was  removed.  Total cross-clamp time of 82 minutes.  Prior to removal of the cross-clamp, the bulldog on the mammary artery was removed with prompt rise in myocardial septal temperature.  The patient required electric defibrillation to return to a sinus rhythm.  Sites of anastomosis were inspected and free of bleeding.  He was then ventilated and weaned from cardiopulmonary bypass without difficulty on low-dose milrinone.  He remained hemodynamically stable.  He was decannulated in usual fashion.  Protamine sulfate was administered.  With the operative field hemostatic, a left pleural tube and a Blake mediastinal drain were left in place.  Sternum was closed with #6 stainless steel wire.  Fascia closed with interrupted 0 Vicryl, running 3-0 Vicryl in subcutaneous tissue and 3-0 subcuticular stitch in skin edges.  Dry dressings were applied.  Sponge and needle count was reported as correct at the completion of procedure.  Total pump time was 108 minutes.  The patient did not require any blood bank blood products during the operative procedure.     Sheliah Plane, MD     EG/MEDQ  D:  12/04/2013  T:  12/04/2013  Job:  [id number]

## 2013-12-04 NOTE — Plan of Care (Signed)
Problem: Phase II - Intermediate Post-Op Goal: Wean to Extubate Outcome: Completed/Met Date Met:  12/04/13 Extubated at 2030 12/03/13 Goal: Advance Diet Outcome: Progressing Pt taking ice chips

## 2013-12-04 NOTE — Progress Notes (Signed)
Patient ID: [name], male   DOB: 08/13/1929, 80 y.o.   MRN: [id number]  SICU Evening Rounds:  Hemodynamically stable off neo  A-fib earlier and started on amio but back in sinus in the 50's so A-paced now.  Urine output 20/hr.  CBC    Component Value Date/Time   WBC 13.3* 12/04/2013 1700   RBC 2.94* 12/04/2013 1700   HGB 11.2* 12/04/2013 1712   HCT 33.0* 12/04/2013 1712   PLT 79* 12/04/2013 1700   MCV 95.9 12/04/2013 1700   MCH 33.0 12/04/2013 1700   MCHC 34.4 12/04/2013 1700   RDW 14.3 12/04/2013 1700    BMET    Component Value Date/Time   NA 141 12/04/2013 1712   K 4.0 12/04/2013 1712   CL 102 12/04/2013 1712   CO2 21 12/04/2013 0410   GLUCOSE 159* 12/04/2013 1712   BUN 17 12/04/2013 1712   CREATININE 1.00 12/04/2013 1712   CALCIUM 7.6* 12/04/2013 0410   GFRNONAA 75* 12/04/2013 1700   GFRAA 87* 12/04/2013 1700    A/P: stable postop course

## 2013-12-04 NOTE — Progress Notes (Addendum)
TCTS DAILY ICU PROGRESS NOTE                   [address]            [place] [id number]          [phone]   1 Day Post-Op Procedure(s) (LRB): CORONARY ARTERY BYPASS GRAFTING (CABG) x4 using left internal mammary artery and right greater saphenous vein. LIMA to LAD, sequential SVG to OM 1 & OM 2, SVG to PD (N/A) INTRAOPERATIVE TRANSESOPHAGEAL ECHOCARDIOGRAM (N/A) ENDOVEIN HARVEST OF GREATER SAPHENOUS VEIN (Right)  Total Length of Stay:  LOS: 1 day   Subjective: Awake, alert, extubated.  Had a lot of nausea overnight, but feeling a little better this am.  Just went into AF, rates 110-120s.     Objective: Vital signs in last 24 hours: Temp:  [95.7 F (35.4 C)-99.3 F (37.4 C)] 99.3 F (37.4 C) (07/09 0645) Pulse Rate:  [28-93] 90 (07/09 0645) Cardiac Rhythm:  [-] Atrial paced (07/08 1945) Resp:  [10-23] 11 (07/09 0645) BP: (72-129)/(52-67) 83/53 mmHg (07/09 0600) SpO2:  [93 %-100 %] 97 % (07/09 0645) Arterial Line BP: (77-147)/(43-72) 113/47 mmHg (07/09 0645) FiO2 (%):  [40 %-50 %] 40 % (07/08 1952) Weight:  [163 lb 14.4 oz (74.345 kg)] 163 lb 14.4 oz (74.345 kg) (07/09 0500)  Filed Weights   12/02/13 1300 12/03/13 0634 12/04/13 0500  Weight: 154 lb 9.6 oz (70.126 kg) 154 lb (69.854 kg) 163 lb 14.4 oz (74.345 kg)  PRE-OPERATIVE WEIGHT: 69kg   Weight change: 9 lb 4.8 oz (4.218 kg)   Hemodynamic parameters for last 24 hours: PAP: (17-29)/(7-17) 23/8 mmHg CO:  [2.8 L/min-7.5 L/min] 7.5 L/min CI:  [1.5 L/min/m2-4 L/min/m2] 4 L/min/m2  Intake/Output from previous day: 07/08 0701 - 07/09 0700 In: 6947.3 [I.V.:4182.3; Blood:525; IV Piggyback:2240] Out: 4845 [Urine:2770; Emesis/NG output:190; Blood:1625; Chest Tube:260]  CBGs 103-94-100-110-154-144-142     Current Meds: Scheduled Meds: . acetaminophen  1,000 mg Oral 4 times per day   Or  . acetaminophen (TYLENOL) oral liquid 160 mg/5 mL  1,000 mg Per Tube 4 times per day  . aspirin EC  325 mg  Oral Daily   Or  . aspirin  324 mg Per Tube Daily  . atorvastatin  80 mg Oral q1800  . bisacodyl  10 mg Oral Daily   Or  . bisacodyl  10 mg Rectal Daily  . cefUROXime (ZINACEF)  IV  1.5 g Intravenous Q12H  . docusate sodium  200 mg Oral Daily  . insulin aspart  0-24 Units Subcutaneous 6 times per day  . insulin regular  0-10 Units Intravenous TID WC  . metoprolol tartrate  12.5 mg Oral BID   Or  . metoprolol tartrate  12.5 mg Per Tube BID  . [START ON 12/05/2013] pantoprazole  40 mg Oral Daily  . sodium chloride  3 mL Intravenous Q12H   Continuous Infusions: . sodium chloride 20 mL/hr at 12/03/13 1500  . sodium chloride 10 mL/hr at 12/03/13 1500  . sodium chloride    . dexmedetomidine Stopped (12/03/13 1630)  . insulin (NOVOLIN-R) infusion 0.9 Units/hr (12/03/13 1730)  . lactated ringers Stopped (12/03/13 1900)  . milrinone 0.125 mcg/kg/min (12/03/13 2100)  . nitroGLYCERIN Stopped (12/03/13 1430)  . phenylephrine (NEO-SYNEPHRINE) Adult infusion Stopped (12/03/13 2000)   PRN Meds:.metoprolol, morphine injection, ondansetron (ZOFRAN) IV, oxyCODONE, sodium chloride   Physical Exam: General appearance: alert, cooperative and no distress Heart: irregularly irregular rhythm Lungs: diminished  breath sounds bibasilar Extremities: Mild bilateral LE edema Wound: Dressed and dry    Lab Results: CBC: Recent Labs  12/03/13 2005 12/04/13 0410  WBC 9.2 9.3  HGB 9.7* 8.8*  HCT 28.3* 26.3*  PLT 70* 63*   BMET:  Recent Labs  12/01/13 1410  12/03/13 1954 12/03/13 2005 12/04/13 0410  NA 138  < > 137  --  140  K 4.2  < > 4.0  --  4.2  CL 102  < > 115*  --  106  CO2 20  --   --   --  21  GLUCOSE 95  < > 110*  --  142*  BUN 21  < > 7  --  12  CREATININE 0.65  < > 0.70 0.63 0.69  CALCIUM 9.2  --   --   --  7.6*  < > = values in this interval not displayed.  PT/INR:  Recent Labs  12/03/13 1425  LABPROT 21.1*  INR 1.82*   Radiology: Dg Chest Portable 1 View In  Am  12/04/2013   CLINICAL DATA:  Postop CABG.  Extubation.  EXAM: PORTABLE CHEST - 1 VIEW  COMPARISON:  Portable chest x-ray yesterday. Two-view chest x-ray 12/01/2013.  FINDINGS: Sternotomy for CABG. Cardiac silhouette mildly to moderately enlarged but stable. Mild pulmonary venous hypertension without overt edema. Left lower lobe atelectasis, unchanged. Minimal linear atelectasis right mid lung. Interval extubation. Swan-Ganz catheter tip overlies the expected location of the main pulmonary trunk. Left chest tube in place with no pneumothorax. Mediastinal drainage tube.  IMPRESSION: Support apparatus satisfactory. No pneumothorax. Stable atelectasis in the left lower lobe. Minimal linear atelectasis in the right mid lung.   Electronically Signed   By: Hulan Saas M.D.   On: 12/04/2013 07:32   Dg Chest Portable 1 View  12/03/2013   CLINICAL DATA:  Status post coronary bypass graft  EXAM: PORTABLE CHEST - 1 VIEW  COMPARISON:  12/01/2013  FINDINGS: Cardiac shadow is mildly enlarged. A nasogastric catheter is noted within the stomach. A mediastinal drain and left thoracostomy catheter are seen. No pneumothorax or pleural effusion is noted. A Swan-Ganz catheter is noted in the pulmonary outflow tract. An endotracheal tube is seen 4.9 cm above the carina. No other focal abnormality is seen.  IMPRESSION: Postoperative change with tubes and lines as described. No acute abnormality is noted.   Electronically Signed   By: Alcide Clever M.D.   On: 12/03/2013 14:51     Assessment/Plan: S/P Procedure(s) (LRB): CORONARY ARTERY BYPASS GRAFTING (CABG) x4 using left internal mammary artery and right greater saphenous vein. LIMA to LAD, sequential SVG to OM 1 & OM 2, SVG to PD (N/A) INTRAOPERATIVE TRANSESOPHAGEAL ECHOCARDIOGRAM (N/A) ENDOVEIN HARVEST OF GREATER SAPHENOUS VEIN (Right)  CV- in AF this am, rates low 100s.  Will decrease pacer to backup of 60 and start IV Amiodarone.  BPs have been soft overnight  requiring volume, currently on Neo, Milrinone. Wean gtts as able.  Expected postop blood loss anemia- H/H down some this am.  In light of hypotension, may need to consider transfusion.  Vol overload- diurese as BP tolerates.  GI- nauseated overnight, will add a few doses of Reglan and watch.  Mobilize, pulm toilet, d/c lines and tubes as able.  Routine POD#1 progression.   COLLINS,GINA H 12/04/2013 7:40 AM  Thrombocytopenia, avoid heparin for now Wean off milirinone to decrease dilation effect, wait on blood transfusion  I have seen and examined Magdaleno Bondy and agree  with the above assessment  and plan.  Delight Ovens MD Beeper [phone] Office [phone] 12/04/2013 8:14 AM

## 2013-12-04 NOTE — Op Note (Signed)
NAMDan Maker:  Edgar Thomas, Edgar Thomas                ACCOUNT NO.:  [id number]  MEDICAL RECORD NO.:  [id number]  LOCATION:  2S04C                        FACILITY:  [facility]  PHYSICIAN:  Edgar PlaneEdward Tamsen Reist, MD    DATE OF BIRTH:  September 15, 1929  DATE OF PROCEDURE:  12/03/2013 DATE OF DISCHARGE:                              OPERATIVE REPORT   PREOPERATIVE DIAGNOSIS:  Coronary occlusive disease with recent subendocardial myocardial infarction.  POSTOPERATIVE DIAGNOSIS:  Coronary occlusive disease with recent subendocardial myocardial infarction.  SURGICAL PROCEDURE:  Coronary artery bypass grafting x4 with the left internal mammary to the left anterior descending coronary artery, sequential reverse saphenous vein graft to the first and second obtuse marginal, reverse saphenous vein graft to the posterior descending with right thigh and calf endovein harvesting.  SURGEON:  Edgar Thomas, M.D.  FIRST ASSISTANT:  Edgar ClackWayne E. Gold, PA.  BRIEF HISTORY:  The patient is an 80 year old male who presented to [facility] in [place] 2 weeks previously after having prolonged chest pain at home.  He was admitted there.  Troponins were noted to be elevated.  Cardiac catheterization was performed which demonstrated subtotal occlusion of the LAD with collateral filling from the right.  These collaterals were jeopardized by high-grade proximal posterior descending obstruction.  He had significant left main obstruction of 90% and proximal circumflex.  In addition, the obtuse marginal branches had greater than 80% stenosis.  The ventricular function was depressed slightly in the 40% to 45% range by echocardiogram.  The patient was [facility] and sought [facility] at [facility].  After reviewing the films and the patient's symptoms,   Dictation ended at this point.     Edgar PlaneEdward Linus Weckerly, MD     EG/MEDQ  D:  12/04/2013  T:  12/04/2013  Job:  [id number]

## 2013-12-05 ENCOUNTER — Inpatient Hospital Stay (HOSPITAL_COMMUNITY): Payer: Medicare Other

## 2013-12-05 DIAGNOSIS — K219 Gastro-esophageal reflux disease without esophagitis: Secondary | ICD-10-CM | POA: Diagnosis present

## 2013-12-05 DIAGNOSIS — E785 Hyperlipidemia, unspecified: Secondary | ICD-10-CM | POA: Diagnosis present

## 2013-12-05 DIAGNOSIS — I4891 Unspecified atrial fibrillation: Secondary | ICD-10-CM | POA: Diagnosis not present

## 2013-12-05 DIAGNOSIS — I2510 Atherosclerotic heart disease of native coronary artery without angina pectoris: Secondary | ICD-10-CM | POA: Diagnosis present

## 2013-12-05 DIAGNOSIS — E782 Mixed hyperlipidemia: Secondary | ICD-10-CM | POA: Diagnosis present

## 2013-12-05 DIAGNOSIS — Z951 Presence of aortocoronary bypass graft: Secondary | ICD-10-CM

## 2013-12-05 LAB — CBC
HCT: 27.3 % — ABNORMAL LOW (ref 39.0–52.0)
Hemoglobin: 9.4 g/dL — ABNORMAL LOW (ref 13.0–17.0)
MCH: 33.2 pg (ref 26.0–34.0)
MCHC: 34.4 g/dL (ref 30.0–36.0)
MCV: 96.5 fL (ref 78.0–100.0)
Platelets: 68 10*3/uL — ABNORMAL LOW (ref 150–400)
RBC: 2.83 MIL/uL — ABNORMAL LOW (ref 4.22–5.81)
RDW: 14.7 % (ref 11.5–15.5)
WBC: 10.9 10*3/uL — ABNORMAL HIGH (ref 4.0–10.5)

## 2013-12-05 LAB — GLUCOSE, CAPILLARY
Glucose-Capillary: 137 mg/dL — ABNORMAL HIGH (ref 70–99)
Glucose-Capillary: 167 mg/dL — ABNORMAL HIGH (ref 70–99)
Glucose-Capillary: 137 mg/dL — ABNORMAL HIGH (ref 70–99)
Glucose-Capillary: 142 mg/dL — ABNORMAL HIGH (ref 70–99)
Glucose-Capillary: 111 mg/dL — ABNORMAL HIGH (ref 70–99)

## 2013-12-05 LAB — BASIC METABOLIC PANEL
Anion gap: 14 (ref 5–15)
BUN: 24 mg/dL — ABNORMAL HIGH (ref 6–23)
CO2: 23 mEq/L (ref 19–32)
Calcium: 8.2 mg/dL — ABNORMAL LOW (ref 8.4–10.5)
Chloride: 100 mEq/L (ref 96–112)
Creatinine, Ser: 1.23 mg/dL (ref 0.50–1.35)
GFR calc Af Amer: 62 mL/min — ABNORMAL LOW (ref >90)
GFR calc non Af Amer: 53 mL/min — ABNORMAL LOW (ref >90)
Glucose, Bld: 139 mg/dL — ABNORMAL HIGH (ref 70–99)
Potassium: 4.4 mEq/L (ref 3.7–5.3)
Sodium: 137 mEq/L (ref 137–147)

## 2013-12-05 MED ORDER — ENSURE COMPLETE PO LIQD
237.0000 mL | ORAL | Status: DC
  Administered 2013-12-06: 237 mL via ORAL

## 2013-12-05 MED ORDER — FUROSEMIDE 10 MG/ML IJ SOLN
20.0000 mg | Freq: Once | INTRAMUSCULAR | Status: AC
  Administered 2013-12-05: 20 mg via INTRAVENOUS
  Filled 2013-12-05: qty 2

## 2013-12-05 MED ORDER — FUROSEMIDE 10 MG/ML IJ SOLN
INTRAMUSCULAR | Status: AC
  Administered 2013-12-05: 40 mg
  Filled 2013-12-05: qty 4

## 2013-12-05 MED ORDER — FOLIC ACID 1 MG PO TABS
1.0000 mg | ORAL_TABLET | Freq: Every day | ORAL | Status: DC
Start: 2013-12-05 — End: 2013-12-11
  Administered 2013-12-05 – 2013-12-11 (×7): 1 mg via ORAL
  Filled 2013-12-05 (×7): qty 1

## 2013-12-05 MED ORDER — FUROSEMIDE 10 MG/ML IJ SOLN
40.0000 mg | Freq: Once | INTRAMUSCULAR | Status: AC
  Administered 2013-12-05: 40 mg via INTRAVENOUS

## 2013-12-05 MED ORDER — AMIODARONE LOAD VIA INFUSION
150.0000 mg | Freq: Once | INTRAVENOUS | Status: AC
  Administered 2013-12-05: 150 mg via INTRAVENOUS
  Filled 2013-12-05: qty 83.34

## 2013-12-05 NOTE — Progress Notes (Signed)
INITIAL NUTRITION ASSESSMENT  DOCUMENTATION CODES Per approved criteria  -Not Applicable   INTERVENTION: Add Ensure Complete po daily, each supplement provides 350 kcal and 13 grams of protein. May substitute with Resource Breeze if patient prefers. Encouraged intake as able. RD to continue to follow nutrition care plan.  NUTRITION DIAGNOSIS: Inadequate oral intake related to nausea as evidenced by limited po intake.   Goal: Intake to meet >90% of estimated nutrition needs.  Monitor:  weight trends, lab trends, I/O's, PO intake, supplement tolerance  Reason for Assessment: Malnutrition Screening Tool  80 y.o. male  Admitting Dx: CAD  ASSESSMENT: PMHx significant for HTN, hyperlipidemia. Admitted with chest pain, work-up reveals CAD.   Patient reports that PTA he was eating well and consuming a healthy diet. Pt denies any unintentional weight loss PTA. Appears well-nourished. Pt with a sign outside of door that says "patient does not want to be touched."  Currently ordered for a Heart Healthy diet.  Underwent the following on 7/8: CORONARY ARTERY BYPASS GRAFTING (CABG) x4  INTRAOPERATIVE TRANSESOPHAGEAL ECHOCARDIOGRAM ENDOVEIN HARVEST OF GREATER SAPHENOUS VEIN  CBG's: 130 - 167 Potassium WNL  Height: Ht Readings from Last 1 Encounters:  12/04/13 5\' 9"  (1.753 m)    Weight: Wt Readings from Last 1 Encounters:  12/05/13 164 lb 10.9 oz (74.7 kg)    Ideal Body Weight: 160 lb  % Ideal Body Weight: 103%  Wt Readings from Last 10 Encounters:  12/05/13 164 lb 10.9 oz (74.7 kg)  12/05/13 164 lb 10.9 oz (74.7 kg)  12/01/13 154 lb 9.6 oz (70.126 kg)  11/21/13 156 lb 8.4 oz (71 kg)    Usual Body Weight: 155 lb  % Usual Body Weight: 106%  BMI:  Body mass index is 24.31 kg/(m^2). WNL  Estimated Nutritional Needs: Kcal: 1700 - 2000 Protein: 90 - 105 grams Fluid: 1.7 - 2 liters  Skin:  Closed chest incision R leg incision  Diet Order: Cardiac  EDUCATION  NEEDS: -Education needs addressed; patient reports that he follows a healthy diet PTA   Intake/Output Summary (Last 24 hours) at 12/05/13 1039 Last data filed at 12/05/13 0800  Gross per 24 hour  Intake 1873.8 ml  Output    780 ml  Net 1093.8 ml    Last BM: PTA  Labs:   Recent Labs Lab 12/01/13 1410  12/03/13 2005 12/04/13 0410 12/04/13 1700 12/04/13 1712 12/05/13 0500  NA 138  < >  --  140  --  141 137  K 4.2  < >  --  4.2  --  4.0 4.4  CL 102  < >  --  106  --  102 100  CO2 20  --   --  21  --   --  23  BUN 21  < >  --  12  --  17 24*  CREATININE 0.65  < > 0.63 0.69 0.99 1.00 1.23  CALCIUM 9.2  --   --  7.6*  --   --  8.2*  MG  --   --  3.0* 2.5 2.7*  --   --   GLUCOSE 95  < >  --  142*  --  159* 139*  < > = values in this interval not displayed.  CBG (last 3)   Recent Labs  12/04/13 2325 12/05/13 0400 12/05/13 0801  GLUCAP 130* 137* 167*    Scheduled Meds: . acetaminophen  1,000 mg Oral 4 times per day   Or  . acetaminophen (  TYLENOL) oral liquid 160 mg/5 mL  1,000 mg Per Tube 4 times per day  . aspirin EC  325 mg Oral Daily   Or  . aspirin  324 mg Per Tube Daily  . atorvastatin  80 mg Oral q1800  . bisacodyl  10 mg Oral Daily   Or  . bisacodyl  10 mg Rectal Daily  . cefUROXime (ZINACEF)  IV  1.5 g Intravenous Q12H  . docusate sodium  200 mg Oral Daily  . folic acid  1 mg Oral Daily  . insulin aspart  0-24 Units Subcutaneous 6 times per day  . metoprolol tartrate  12.5 mg Oral BID   Or  . metoprolol tartrate  12.5 mg Per Tube BID  . pantoprazole  40 mg Oral Daily  . sodium chloride  3 mL Intravenous Q12H    Continuous Infusions: . sodium chloride 20 mL/hr at 12/05/13 0800  . sodium chloride 10 mL/hr at 12/05/13 0800  . amiodarone 30 mg/hr (12/05/13 0800)  . lactated ringers 20 mL/hr at 12/05/13 0800  . nitroGLYCERIN Stopped (12/03/13 1430)  . phenylephrine (NEO-SYNEPHRINE) Adult infusion Stopped (12/04/13 1700)    Past Medical History   Diagnosis Date  . Hyperlipemia   . CAD (coronary artery disease)   . Heartburn   . A-fib   . Hypertension   . Heart attack   . Constipation   . GERD (gastroesophageal reflux disease)     Past Surgical History  Procedure Laterality Date  . Balloon angioplasty of lad    . Colonoscopy    . Coronary artery bypass graft N/A 12/03/2013    Procedure: CORONARY ARTERY BYPASS GRAFTING (CABG) x4 using left internal mammary artery and right greater saphenous vein. LIMA to LAD, sequential SVG to OM 1 & OM 2, SVG to PD;  Surgeon: Delight OvensEdward B Gerhardt, MD;  [facility];  Service: Open Heart Surgery;  Laterality: N/A;  . Intraoperative transesophageal echocardiogram N/A 12/03/2013    Procedure: INTRAOPERATIVE TRANSESOPHAGEAL ECHOCARDIOGRAM;  Surgeon: Delight OvensEdward B Gerhardt, MD;  [facility];  Service: Open Heart Surgery;  Laterality: N/A;  . Endovein harvest of greater saphenous vein Right 12/03/2013    Procedure: ENDOVEIN HARVEST OF GREATER SAPHENOUS VEIN;  Surgeon: Delight OvensEdward B Gerhardt, MD;  [facility];  Service: Open Heart Surgery;  Laterality: Right;    Jarold MottoSamantha Journei Thomassen MS, RD, LDN Inpatient Registered Dietitian Pager: [phone] After-hours pager: [phone]

## 2013-12-05 NOTE — Progress Notes (Addendum)
[facility] DAILY ICU PROGRESS NOTE                   [address]            [place] [address]          [phone]   2 Days Post-Op Procedure(s) (LRB): CORONARY ARTERY BYPASS GRAFTING (CABG) x4 using left internal mammary artery and right greater saphenous vein. LIMA to LAD, sequential SVG to OM 1 & OM 2, SVG to PD (N/A) INTRAOPERATIVE TRANSESOPHAGEAL ECHOCARDIOGRAM (N/A) ENDOVEIN HARVEST OF GREATER SAPHENOUS VEIN (Right)  Total Length of Stay:  LOS: 2 days   Subjective: In AF this am, rates 80-90s after Amio bolus.  C/o nausea, but otherwise feeling well.   Objective: Vital signs in last 24 hours: Temp:  [97.6 F (36.4 C)-99.9 F (37.7 C)] 98.8 F (37.1 C) (07/10 0400) Pulse Rate:  [60-121] 91 (07/10 0700) Cardiac Rhythm:  [-] Atrial fibrillation (07/10 0600) Resp:  [10-24] 14 (07/10 0700) BP: (87-122)/(47-81) 103/54 mmHg (07/10 0700) SpO2:  [89 %-100 %] 96 % (07/10 0700) Arterial Line BP: (82-144)/(42-64) 116/57 mmHg (07/10 0700) Weight:  [163 lb 14.4 oz (74.345 kg)-164 lb 10.9 oz (74.7 kg)] 164 lb 10.9 oz (74.7 kg) (07/10 0500)  Filed Weights   12/04/13 0500 12/04/13 0915 12/05/13 0500  Weight: 163 lb 14.4 oz (74.345 kg) 163 lb 14.4 oz (74.345 kg) 164 lb 10.9 oz (74.7 kg)  PRE-OPERATIVE WEIGHT: 69kg   Weight change: 0 oz (0.001 kg)   Hemodynamic parameters for last 24 hours: PAP: (23-30)/(10-14) 26/11 mmHg CO:  [4 L/min] 4 L/min CI:  [2.1 L/min/m2] 2.1 L/min/m2  Intake/Output from previous day: 07/09 0701 - 07/10 0700 In: 1494.2 [P.O.:270; I.V.:1124.2; IV Piggyback:100] Out: 960 [Urine:560; Chest Tube:400]  Intake/Output this shift:    Current Meds: Scheduled Meds: . acetaminophen  1,000 mg Oral 4 times per day   Or  . acetaminophen (TYLENOL) oral liquid 160 mg/5 mL  1,000 mg Per Tube 4 times per day  . amiodarone  150 mg Intravenous Once  . aspirin EC  325 mg Oral Daily   Or  . aspirin  324 mg Per Tube Daily  . atorvastatin  80 mg Oral  q1800  . bisacodyl  10 mg Oral Daily   Or  . bisacodyl  10 mg Rectal Daily  . cefUROXime (ZINACEF)  IV  1.5 g Intravenous Q12H  . docusate sodium  200 mg Oral Daily  . folic acid  1 mg Oral Daily  . furosemide      . insulin aspart  0-24 Units Subcutaneous 6 times per day  . metoprolol tartrate  12.5 mg Oral BID   Or  . metoprolol tartrate  12.5 mg Per Tube BID  . pantoprazole  40 mg Oral Daily  . sodium chloride  3 mL Intravenous Q12H   Continuous Infusions: . sodium chloride 20 mL/hr at 12/03/13 1500  . sodium chloride 10 mL/hr at 12/03/13 1500  . amiodarone 30 mg/hr (12/05/13 0400)  . lactated ringers 20 mL/hr at 12/05/13 0400  . nitroGLYCERIN Stopped (12/03/13 1430)  . phenylephrine (NEO-SYNEPHRINE) Adult infusion Stopped (12/04/13 1700)   PRN Meds:.metoprolol, morphine injection, ondansetron (ZOFRAN) IV, oxyCODONE, sodium chloride   Physical Exam: General appearance: alert, cooperative and no distress Heart: irregularly irregular rhythm Lungs: diminished breath sounds bibasilar Extremities: Mild LE edema Wound: Dressed and dry  Lab Results: CBC: Recent Labs  12/04/13 1700 12/04/13 1712 12/05/13 0500  WBC 13.3*  --  10.9*  HGB 9.7* 11.2* 9.4*  HCT 28.2* 33.0* 27.3*  PLT 79*  --  68*   BMET:  Recent Labs  12/04/13 0410  12/04/13 1712 12/05/13 0500  NA 140  --  141 137  K 4.2  --  4.0 4.4  CL 106  --  102 100  CO2 21  --   --  23  GLUCOSE 142*  --  159* 139*  BUN 12  --  17 24*  CREATININE 0.69  < > 1.00 1.23  CALCIUM 7.6*  --   --  8.2*  < > = values in this interval not displayed.  PT/INR:  Recent Labs  12/03/13 1425  LABPROT 21.1*  INR 1.82*   Radiology: Dg Chest Port 1 View  12/05/2013   CLINICAL DATA:  post op check chest tube  EXAM: PORTABLE CHEST - 1 VIEW  COMPARISON:  Yesterday  FINDINGS: Swan-Ganz catheter removed with the right internal jugular introducer venous sheath and placed. Chest and mediastinal tubes stable. No pneumothorax. No  CHF. Low volumes with basilar atelectasis left greater than right is worse. Vascular congestion has resolved.  IMPRESSION: Resolved vascular congestion.  No pneumothorax or CHF.  Bibasilar atelectasis worse.   Electronically Signed   By: Maryclare BeanArt  Hoss M.D.   On: 12/05/2013 07:42   Dg Chest Portable 1 View In Am  12/04/2013   CLINICAL DATA:  Postop CABG.  Extubation.  EXAM: PORTABLE CHEST - 1 VIEW  COMPARISON:  Portable chest x-ray yesterday. Two-view chest x-ray 12/01/2013.  FINDINGS: Sternotomy for CABG. Cardiac silhouette mildly to moderately enlarged but stable. Mild pulmonary venous hypertension without overt edema. Left lower lobe atelectasis, unchanged. Minimal linear atelectasis right mid lung. Interval extubation. Swan-Ganz catheter tip overlies the expected location of the main pulmonary trunk. Left chest tube in place with no pneumothorax. Mediastinal drainage tube.  IMPRESSION: Support apparatus satisfactory. No pneumothorax. Stable atelectasis in the left lower lobe. Minimal linear atelectasis in the right mid lung.   Electronically Signed   By: Hulan Saashomas  Lawrence M.D.   On: 12/04/2013 07:32   Dg Chest Portable 1 View  12/03/2013   CLINICAL DATA:  Status post coronary bypass graft  EXAM: PORTABLE CHEST - 1 VIEW  COMPARISON:  12/01/2013  FINDINGS: Cardiac shadow is mildly enlarged. A nasogastric catheter is noted within the stomach. A mediastinal drain and left thoracostomy catheter are seen. No pneumothorax or pleural effusion is noted. A Swan-Ganz catheter is noted in the pulmonary outflow tract. An endotracheal tube is seen 4.9 cm above the carina. No other focal abnormality is seen.  IMPRESSION: Postoperative change with tubes and lines as described. No acute abnormality is noted.   Electronically Signed   By: Alcide CleverMark  Lukens M.D.   On: 12/03/2013 14:51     Assessment/Plan: S/P Procedure(s) (LRB): CORONARY ARTERY BYPASS GRAFTING (CABG) x4 using left internal mammary artery and right greater saphenous  vein. LIMA to LAD, sequential SVG to OM 1 & OM 2, SVG to PD (N/A) INTRAOPERATIVE TRANSESOPHAGEAL ECHOCARDIOGRAM (N/A) ENDOVEIN HARVEST OF GREATER SAPHENOUS VEIN (Right) CV- back in AF this am, rates improved after Amio bolus. Continue pacer to backup as needed. BPs remain low-normal.  Off all pressors. Expected postop blood loss anemia- H/H improving. Vol overload- diurese.  GI- remains nauseated, will continue Reglan and Protonix.  May be exacerbated by Amio.  Will watch.  Mobilize, pulm toilet, d/c CT tubes.  Leave Foley one more day for diuresis/volume measurement.   COLLINS,GINA H 12/05/2013 7:55  AM  thrombocytopenia still present, avoiding heparin/lovenox I have seen and examined Orvil Newlon and agree with the above assessment  and plan.  Delight Ovens MD Beeper [phone] Office [phone] 12/05/2013 9:02 AM

## 2013-12-05 NOTE — Progress Notes (Signed)
CT surgery p.m. Rounds Patient resting comfortably Atrially paced   blood pressure 100/80, continues on IV amiodarone No p.m. Labs stable day

## 2013-12-05 NOTE — Consult Note (Signed)
Reason for Consult: PAF  Requesting Physician: Dr Tyrone SageGerhardt  HPI: This is a 80 y.o. male with a past medical history significant for CAD. He had been followed for many years by Dr Judie PetitM. Adalberto Coleahtt [place]. He had prior PCI ("19 yrs ago") [facility]. He has done well from a cardiac standpoint till he [facility] in [place] 3 weeks ago with chest pain. He was noted to be in rapid AF then. His Troponin went to 15. He had a cath and was actually seen by a surgeon there but decided to see Dr Tyrone SageGerhardt.  EF was 40-45%. Cath Showed diffuse disease of at least 50% left main LAD has a 99% stenosis at the ostium of medium sized CFX,  a second 90% stenosis in the mid circumflex, and a heavily calcified RCA with an 80% proximal PDA stenosis. The right supplied collaterals to the left anterior descending.  After discussion with Dr Tyrone SageGerhardt as an OP the pt agreed to proceed with CABG. On 12/03/13 he underwent CABG x 4 using LIMA to LAD, sequential SVG to OM 1 & OM 2, SVG to PD. He has tolerated this well. He has had some PAF post op and was placed on IV Amiodarone and then developed sinus bradycardia requiring A pacing. His current underlying rhythm is sinus bradycardia.     PMHx:  Past Medical History  Diagnosis Date  . Hyperlipemia   . CAD (coronary artery disease)   . Heartburn   . A-fib   . Hypertension   . Heart attack   . Constipation   . GERD (gastroesophageal reflux disease)     Past Surgical History  Procedure Laterality Date  . Balloon angioplasty of lad    . Colonoscopy    . Coronary artery bypass graft N/A 12/03/2013    Procedure: CORONARY ARTERY BYPASS GRAFTING (CABG) x4 using left internal mammary artery and right greater saphenous vein. LIMA to LAD, sequential SVG to OM 1 & OM 2, SVG to PD;  Surgeon: Delight OvensEdward B Gerhardt, MD;  [facility];  Service: Open Heart Surgery;  Laterality: N/A;  . Intraoperative transesophageal echocardiogram N/A 12/03/2013    Procedure:  INTRAOPERATIVE TRANSESOPHAGEAL ECHOCARDIOGRAM;  Surgeon: Delight OvensEdward B Gerhardt, MD;  [facility];  Service: Open Heart Surgery;  Laterality: N/A;  . Endovein harvest of greater saphenous vein Right 12/03/2013    Procedure: ENDOVEIN HARVEST OF GREATER SAPHENOUS VEIN;  Surgeon: Delight OvensEdward B Gerhardt, MD;  [facility];  Service: Open Heart Surgery;  Laterality: Right;    SOCHx:  reports that he has never smoked. He has never used smokeless tobacco. He reports that he does not drink alcohol or use illicit drugs.  FAMHx: Family History  Problem Relation Age of Onset  . Heart disease Mother     died at age 78 of heart attack    ALLERGIES: Allergies  Allergen Reactions  . Contrast Media [Iodinated Diagnostic Agents] Rash    [facility]    ROS: A comprehensive review of systems was negative.  HOME MEDICATIONS: Prior to Admission medications   Medication Sig Start Date End Date Taking? Authorizing Provider  aspirin 325 MG tablet Take 325 mg by mouth daily.   Yes Historical Provider, MD  metoprolol (LOPRESSOR) 50 MG tablet Take 50 mg by mouth 2 (two) times daily.   Yes Historical Provider, MD  Multiple Vitamin (MULTIVITAMIN) capsule Take 1 capsule by mouth daily.   Yes Historical Provider, MD  niacin (NIASPAN) 1000 MG CR tablet Take 1,000 mg by mouth at bedtime.   Yes Historical Provider, MD  nitroGLYCERIN (NITROSTAT) 0.4 MG SL tablet Place 0.4 mg under the tongue every 5 (five) minutes as needed for chest pain.   Yes Historical Provider, MD  Omega 3 1000 MG CAPS Take 1,000 mg by mouth daily.    Yes Historical Provider, MD  omeprazole (PRILOSEC) 20 MG capsule Take 20 mg by mouth daily as needed (acid reflux).    Yes Historical Provider, MD  ramipril (ALTACE) 2.5 MG capsule Take 2.5 mg by mouth daily.   Yes Historical Provider, MD  rosuvastatin (CRESTOR) 40 MG tablet Take 40 mg by mouth daily.   Yes Historical Provider, MD  Selenium 100 MCG CAPS Take 100 mcg by  mouth daily as needed (muscle support).   Yes Historical Provider, MD  Sodium Chloride-Sodium Bicarb (NETI POT SINUS WASH NA) Place 1 application into the nose daily as needed (congestion).   Yes Historical Provider, MD  Vitamin D, Cholecalciferol, 1000 UNITS TABS Take 1,000 Units by mouth daily.    Yes Historical Provider, MD    HOSPITAL MEDICATIONS: I have reviewed the patient's current medications.  VITALS: Blood pressure 125/77, pulse 80, temperature 97.7 F (36.5 C), temperature source Oral, resp. rate 14, height 5\' 9"  (1.753 m), weight 164 lb 10.9 oz (74.7 kg), SpO2 95.00%.  PHYSICAL EXAM: General appearance: alert, cooperative and no distress Neck: no carotid bruit, no JVD and central line Rt neck Lungs: clear to auscultation bilaterally and decreased breath sounds at the bases Heart: regular rate and rhythm Abdomen: soft, non-tender; bowel sounds normal; no masses,  no organomegaly Extremities: extremities normal, atraumatic, no cyanosis or edema Pulses: 2+ and symmetric Skin: cool and pale Neurologic: Grossly normal  LABS: Results for orders placed during the hospital encounter of 12/03/13 (from the past 24 hour(s))  GLUCOSE, CAPILLARY     Status: Abnormal   Collection Time    12/04/13  4:07 PM      Result Value Ref Range   Glucose-Capillary 158 (*) 70 - 99 mg/dL   Comment 1 Documented in Chart     Comment 2 Notify RN    MAGNESIUM     Status: Abnormal   Collection Time    12/04/13  5:00 PM      Result Value Ref Range   Magnesium 2.7 (*) 1.5 - 2.5 mg/dL  CBC     Status: Abnormal   Collection Time    12/04/13  5:00 PM      Result Value Ref Range   WBC 13.3 (*) 4.0 - 10.5 K/uL   RBC 2.94 (*) 4.22 - 5.81 MIL/uL   Hemoglobin 9.7 (*) 13.0 - 17.0 g/dL   HCT 96.0 (*) 45.4 - 09.8 %   MCV 95.9  78.0 - 100.0 fL   MCH 33.0  26.0 - 34.0 pg   MCHC 34.4  30.0 - 36.0 g/dL   RDW 11.9  14.7 - 82.9 %   Platelets 79 (*) 150 - 400 K/uL  CREATININE, SERUM     Status: Abnormal    Collection Time    12/04/13  5:00 PM      Result Value Ref Range   Creatinine, Ser 0.99  0.50 - 1.35 mg/dL   GFR calc non Af Amer 75 (*) >90 mL/min   GFR calc Af Amer 87 (*) >90 mL/min  POCT I-STAT, CHEM 8     Status: Abnormal   Collection Time  12/04/13  5:12 PM      Result Value Ref Range   Sodium 141  137 - 147 mEq/L   Potassium 4.0  3.7 - 5.3 mEq/L   Chloride 102  96 - 112 mEq/L   BUN 17  6 - 23 mg/dL   Creatinine, Ser 1.61  0.50 - 1.35 mg/dL   Glucose, Bld 096 (*) 70 - 99 mg/dL   Calcium, Ion 0.45  4.09 - 1.30 mmol/L   TCO2 21  0 - 100 mmol/L   Hemoglobin 11.2 (*) 13.0 - 17.0 g/dL   HCT 81.1 (*) 91.4 - 78.2 %  GLUCOSE, CAPILLARY     Status: Abnormal   Collection Time    12/04/13  7:36 PM      Result Value Ref Range   Glucose-Capillary 139 (*) 70 - 99 mg/dL   Comment 1 Documented in Chart     Comment 2 Notify RN    GLUCOSE, CAPILLARY     Status: Abnormal   Collection Time    12/04/13 11:25 PM      Result Value Ref Range   Glucose-Capillary 130 (*) 70 - 99 mg/dL   Comment 1 Documented in Chart     Comment 2 Notify RN    GLUCOSE, CAPILLARY     Status: Abnormal   Collection Time    12/05/13  4:00 AM      Result Value Ref Range   Glucose-Capillary 137 (*) 70 - 99 mg/dL   Comment 1 Documented in Chart     Comment 2 Notify RN    BASIC METABOLIC PANEL     Status: Abnormal   Collection Time    12/05/13  5:00 AM      Result Value Ref Range   Sodium 137  137 - 147 mEq/L   Potassium 4.4  3.7 - 5.3 mEq/L   Chloride 100  96 - 112 mEq/L   CO2 23  19 - 32 mEq/L   Glucose, Bld 139 (*) 70 - 99 mg/dL   BUN 24 (*) 6 - 23 mg/dL   Creatinine, Ser 9.56  0.50 - 1.35 mg/dL   Calcium 8.2 (*) 8.4 - 10.5 mg/dL   GFR calc non Af Amer 53 (*) >90 mL/min   GFR calc Af Amer 62 (*) >90 mL/min   Anion gap 14  5 - 15  CBC     Status: Abnormal   Collection Time    12/05/13  5:00 AM      Result Value Ref Range   WBC 10.9 (*) 4.0 - 10.5 K/uL   RBC 2.83 (*) 4.22 - 5.81 MIL/uL    Hemoglobin 9.4 (*) 13.0 - 17.0 g/dL   HCT 21.3 (*) 08.6 - 57.8 %   MCV 96.5  78.0 - 100.0 fL   MCH 33.2  26.0 - 34.0 pg   MCHC 34.4  30.0 - 36.0 g/dL   RDW 46.9  62.9 - 52.8 %   Platelets 68 (*) 150 - 400 K/uL  GLUCOSE, CAPILLARY     Status: Abnormal   Collection Time    12/05/13  8:01 AM      Result Value Ref Range   Glucose-Capillary 167 (*) 70 - 99 mg/dL   Comment 1 Documented in Chart     Comment 2 Notify RN    GLUCOSE, CAPILLARY     Status: Abnormal   Collection Time    12/05/13 12:18 PM      Result Value Ref Range   Glucose-Capillary  137 (*) 70 - 99 mg/dL   Comment 1 Documented in Chart     Comment 2 Notify RN      EKG: Sinus brady, incomplete RBBB, LVH on pre op EKG 12/01/13 Post op shows junctional bradycardia.   IMAGING: Dg Chest Port 1 View  12/05/2013   CLINICAL DATA:  post op check chest tube  EXAM: PORTABLE CHEST - 1 VIEW  COMPARISON:  Yesterday  FINDINGS: Swan-Ganz catheter removed with the right internal jugular introducer venous sheath and placed. Chest and mediastinal tubes stable. No pneumothorax. No CHF. Low volumes with basilar atelectasis left greater than right is worse. Vascular congestion has resolved.  IMPRESSION: Resolved vascular congestion.  No pneumothorax or CHF.  Bibasilar atelectasis worse.   Electronically Signed   By: Maryclare Bean M.D.   On: 12/05/2013 07:42     IMPRESSION: Active Problems:   S/P CABG x 4 12/04/13   PAF (paroxysmal atrial fibrillation)   CAD- last PCI 19 yrs ago at [facility]   GERD (gastroesophageal reflux disease)   Hyperlipemia   RECOMMENDATION: Consider holding beta blocker- continue Amiodarone. MD to see.  Time Spent Directly with Patient: 45 minutes  Abelino Derrick [phone] beeper 12/05/2013, 3:45 PM   I have seen and examined the patient along with Abelino Derrick PA.  I have reviewed the chart, notes and new data.  I agree with PA's note.  Key new complaints: feels pretty good at rest, but became very weak when he tried to  stand and walk Key examination changes: irregular rhythm, no rub Key new findings / data: underlying rhythm is now sinus bradycardia around 50 with very frequent PACs  PLAN: Agree with amiodarone IV an holding beta blocker for now. Suspect that sinus rate will gradually increase and may allow [place] to add back a beta blocker. Continue atrial pacing to help "override" the atrial ectopy and hopefully reduce likelihood of AF recurrence. He will need long term anticoagulation for stroke prevention. Briefly discussed pros and cons of warfarin vs. NOAC. Will start after tubes/leads removed.  Thurmon Fair, MD, [facility] [phone] 12/05/2013, 6:46 PM

## 2013-12-06 LAB — CBC
HCT: 27.4 % — ABNORMAL LOW (ref 39.0–52.0)
Hemoglobin: 9.3 g/dL — ABNORMAL LOW (ref 13.0–17.0)
MCH: 32.6 pg (ref 26.0–34.0)
MCHC: 33.9 g/dL (ref 30.0–36.0)
MCV: 96.1 fL (ref 78.0–100.0)
Platelets: 67 10*3/uL — ABNORMAL LOW (ref 150–400)
RBC: 2.85 MIL/uL — ABNORMAL LOW (ref 4.22–5.81)
RDW: 14.9 % (ref 11.5–15.5)
WBC: 10.2 10*3/uL (ref 4.0–10.5)

## 2013-12-06 LAB — GLUCOSE, CAPILLARY
Glucose-Capillary: 103 mg/dL — ABNORMAL HIGH (ref 70–99)
Glucose-Capillary: 104 mg/dL — ABNORMAL HIGH (ref 70–99)
Glucose-Capillary: 106 mg/dL — ABNORMAL HIGH (ref 70–99)
Glucose-Capillary: 107 mg/dL — ABNORMAL HIGH (ref 70–99)
Glucose-Capillary: 117 mg/dL — ABNORMAL HIGH (ref 70–99)
Glucose-Capillary: 122 mg/dL — ABNORMAL HIGH (ref 70–99)
Glucose-Capillary: 123 mg/dL — ABNORMAL HIGH (ref 70–99)

## 2013-12-06 LAB — BASIC METABOLIC PANEL
Anion gap: 11 (ref 5–15)
BUN: 34 mg/dL — ABNORMAL HIGH (ref 6–23)
CO2: 26 mEq/L (ref 19–32)
Calcium: 8.3 mg/dL — ABNORMAL LOW (ref 8.4–10.5)
Chloride: 100 mEq/L (ref 96–112)
Creatinine, Ser: 1.36 mg/dL — ABNORMAL HIGH (ref 0.50–1.35)
GFR calc Af Amer: 55 mL/min — ABNORMAL LOW (ref >90)
GFR calc non Af Amer: 47 mL/min — ABNORMAL LOW (ref >90)
Glucose, Bld: 115 mg/dL — ABNORMAL HIGH (ref 70–99)
Potassium: 4.5 mEq/L (ref 3.7–5.3)
Sodium: 137 mEq/L (ref 137–147)

## 2013-12-06 MED ORDER — METOPROLOL TARTRATE 1 MG/ML IV SOLN
INTRAVENOUS | Status: AC
  Administered 2013-12-06: 5 mg
  Filled 2013-12-06: qty 5

## 2013-12-06 MED ORDER — MOVING RIGHT ALONG BOOK
Freq: Once | Status: AC
  Administered 2013-12-06: 18:00:00
  Filled 2013-12-06: qty 1

## 2013-12-06 MED ORDER — AMIODARONE HCL 200 MG PO TABS
200.0000 mg | ORAL_TABLET | Freq: Two times a day (BID) | ORAL | Status: DC
  Administered 2013-12-06 – 2013-12-11 (×11): 200 mg via ORAL
  Filled 2013-12-06 (×13): qty 1

## 2013-12-06 MED ORDER — MAGNESIUM HYDROXIDE 400 MG/5ML PO SUSP: 30.0000 mL | Freq: Every day | ORAL | Status: DC | PRN

## 2013-12-06 MED ORDER — ROSUVASTATIN CALCIUM 40 MG PO TABS
40.0000 mg | ORAL_TABLET | Freq: Every day | ORAL | Status: DC
  Administered 2013-12-06 – 2013-12-10 (×5): 40 mg via ORAL
  Filled 2013-12-06 (×6): qty 1

## 2013-12-06 MED ORDER — INSULIN ASPART 100 UNIT/ML ~~LOC~~ SOLN: 0.0000 [IU] | Freq: Three times a day (TID) | SUBCUTANEOUS | Status: DC

## 2013-12-06 MED ORDER — SALINE SPRAY 0.65 % NA SOLN
1.0000 | NASAL | Status: DC | PRN
  Filled 2013-12-06 (×2): qty 44

## 2013-12-06 MED ORDER — METOPROLOL TARTRATE 1 MG/ML IV SOLN: 5.0000 mg | Freq: Once | INTRAVENOUS | Status: AC

## 2013-12-06 MED ORDER — SODIUM CHLORIDE 0.9 % IV SOLN: 250.0000 mL | INTRAVENOUS | Status: DC | PRN

## 2013-12-06 MED ORDER — BISACODYL 10 MG RE SUPP
10.0000 mg | Freq: Every day | RECTAL | Status: DC | PRN
  Administered 2013-12-06 – 2013-12-07 (×2): 10 mg via RECTAL
  Filled 2013-12-06 (×3): qty 1

## 2013-12-06 MED ORDER — NON FORMULARY: 40.0000 mg | Freq: Every morning | Status: DC

## 2013-12-06 MED ORDER — SODIUM CHLORIDE 0.9 % IJ SOLN
3.0000 mL | Freq: Two times a day (BID) | INTRAMUSCULAR | Status: DC
  Administered 2013-12-06 – 2013-12-07 (×2): 3 mL via INTRAVENOUS

## 2013-12-06 MED ORDER — TRAMADOL HCL 50 MG PO TABS
50.0000 mg | ORAL_TABLET | ORAL | Status: DC | PRN
  Administered 2013-12-07 – 2013-12-10 (×5): 50 mg via ORAL
  Filled 2013-12-06 (×5): qty 1

## 2013-12-06 MED ORDER — ASPIRIN EC 81 MG PO TBEC
81.0000 mg | DELAYED_RELEASE_TABLET | Freq: Every day | ORAL | Status: DC
  Administered 2013-12-07 – 2013-12-11 (×5): 81 mg via ORAL
  Filled 2013-12-06 (×5): qty 1

## 2013-12-06 MED ORDER — SODIUM CHLORIDE 0.9 % IJ SOLN: 3.0000 mL | INTRAMUSCULAR | Status: DC | PRN

## 2013-12-06 MED ORDER — AMIODARONE HCL IN DEXTROSE 360-4.14 MG/200ML-% IV SOLN
30.0000 mg/h | INTRAVENOUS | Status: DC
  Administered 2013-12-06 – 2013-12-08 (×5): 30 mg/h via INTRAVENOUS
  Filled 2013-12-06 (×6): qty 200

## 2013-12-06 NOTE — Progress Notes (Signed)
Patient heart rate increased to 130's-150. External pacer was failing to sense. Patient converted to A-fib. Patient Asymptomatic. BP 145/106. Oxygen 93%. Changed pacer setting to VVI at 70 per order. MD notified. Orders placed. See MAR. Will continue to monitor. Call bell within reach.  Valinda HoarLexie Alece Koppel RN

## 2013-12-06 NOTE — Progress Notes (Signed)
Patient ID: Edgar Thomas, male   DOB: March 27, 1930, 80 y.o.   MRN: [id number]    Subjective:  No complaints Sitting in chair talking with friends       Objective:  Filed Vitals:   12/06/13 0700 12/06/13 0800 12/06/13 0858 12/06/13 0900  BP: 119/72 136/75  87/49  Pulse: 79 78  80  Temp:   98.3 F (36.8 C)   TempSrc:   Oral   Resp: 15 21  22   Height:      Weight:      SpO2: 93% 93%  92%    Intake/Output from previous day:  Intake/Output Summary (Last 24 hours) at 12/06/13 1039 Last data filed at 12/06/13 0900  Gross per 24 hour  Intake 1337.4 ml  Output    710 ml  Net  627.4 ml    Physical Exam: Affect appropriate Elderly male  HEENT: normal Neck supple with no adenopathy JVP normal no bruits no thyromegaly Lungs clear with no wheezing and good diaphragmatic motion Heart:  S1/S2 no murmur, no rub, gallop or click Sternotomy  PMI normal Abdomen: benighn, BS positve, no tenderness, no AAA no bruit.  No HSM or HJR Distal pulses intact with no bruits No edema Neuro non-focal Skin warm and dry   Lab Results: Basic Metabolic Panel:  Recent Labs  40/98/11 0410 12/04/13 1700  12/05/13 0500 12/06/13 0504  NA 140  --   < > 137 137  K 4.2  --   < > 4.4 4.5  CL 106  --   < > 100 100  CO2 21  --   --  23 26  GLUCOSE 142*  --   < > 139* 115*  BUN 12  --   < > 24* 34*  CREATININE 0.69 0.99  < > 1.23 1.36*  CALCIUM 7.6*  --   --  8.2* 8.3*  MG 2.5 2.7*  --   --   --   < > = values in this interval not displayed. CBC:  Recent Labs  12/05/13 0500 12/06/13 0504  WBC 10.9* 10.2  HGB 9.4* 9.3*  HCT 27.3* 27.4*  MCV 96.5 96.1  PLT 68* 67*    Imaging: Dg Chest Port 1 View  12/05/2013   CLINICAL DATA:  post op check chest tube  EXAM: PORTABLE CHEST - 1 VIEW  COMPARISON:  Yesterday  FINDINGS: Swan-Ganz catheter removed with the right internal jugular introducer venous sheath and placed. Chest and mediastinal tubes stable. No pneumothorax. No CHF. Low volumes with  basilar atelectasis left greater than right is worse. Vascular congestion has resolved.  IMPRESSION: Resolved vascular congestion.  No pneumothorax or CHF.  Bibasilar atelectasis worse.   Electronically Signed   By: Maryclare Bean M.D.   On: 12/05/2013 07:42    Cardiac Studies:  ECG:    Telemetry:  A pacing   Echo:   EF 40-45%  Mild MR   Medications:   . acetaminophen  1,000 mg Oral 4 times per day   Or  . acetaminophen (TYLENOL) oral liquid 160 mg/5 mL  1,000 mg Per Tube 4 times per day  . amiodarone  200 mg Oral BID  . [START ON 12/07/2013] aspirin EC  81 mg Oral Daily  . atorvastatin  80 mg Oral q1800  . bisacodyl  10 mg Oral Daily   Or  . bisacodyl  10 mg Rectal Daily  . docusate sodium  200 mg Oral Daily  . feeding supplement (ENSURE COMPLETE)  237 mL Oral Q24H  . folic acid  1 mg Oral Daily  . metoprolol tartrate  12.5 mg Oral BID   Or  . metoprolol tartrate  12.5 mg Per Tube BID  . pantoprazole  40 mg Oral Daily  . sodium chloride  3 mL Intravenous Q12H     . sodium chloride Stopped (12/05/13 1500)    Assessment/Plan:  PAF:  Maint NSR  [facility]  Will d/c oral beta blockade for now  Continue low dose oral amiodarone  Anticoagulate when ok with CVTS CAD:  Post CABG  EF 40-45% mild MR  Chol:  On statin    Charlton Hawseter Ewell Benassi 12/06/2013, 10:39 AM

## 2013-12-06 NOTE — Progress Notes (Signed)
Tx pt to 2W23 in wheelchair, tolerated tx well, family made aware of transfer.     Cordis sleeve was removed at 1515, patient was flat, instructed to hold breath, sleeve was removed, pressure was held for a few minutes, occlusive pressure (vaseline) dressing applied.  Bedrest was implemented for 30 mins.

## 2013-12-06 NOTE — Progress Notes (Signed)
3 Days Post-Op Procedure(s) (LRB): CORONARY ARTERY BYPASS GRAFTING (CABG) x4 using left internal mammary artery and right greater saphenous vein. LIMA to LAD, sequential SVG to OM 1 & OM 2, SVG to PD (N/A) INTRAOPERATIVE TRANSESOPHAGEAL ECHOCARDIOGRAM (N/A) ENDOVEIN HARVEST OF GREATER SAPHENOUS VEIN (Right) Subjective: Doing well after multivessel CABg Postop A. fib now converted to sinus rhythm on amiodarone IV amiodarone converted to oral dosing, patient atrially paced for bradycardia Patient angulating chest x-ray clear doing well overall  Objective: Vital signs in last 24 hours: Temp:  [97.8 F (36.6 C)-98.3 F (36.8 C)] 98.2 F (36.8 C) (07/11 1545) Pulse Rate:  [78-104] 79 (07/11 1500) Cardiac Rhythm:  [-] Atrial paced (07/11 1500) Resp:  [9-22] 16 (07/11 1500) BP: (79-140)/(49-96) 140/75 mmHg (07/11 1500) SpO2:  [90 %-98 %] 94 % (07/11 1500) Weight:  [166 lb 0.1 oz (75.3 kg)] 166 lb 0.1 oz (75.3 kg) (07/11 0500)  Hemodynamic parameters for last 24 hours:   stable  Intake/Output from previous day: 07/10 0701 - 07/11 0700 In: 2274.1 [P.O.:520; I.V.:1754.1] Out: 855 [Urine:855] Intake/Output this shift: Total I/O In: 73.4 [I.V.:73.4] Out: 180 [Urine:180]  Lungs clear to auscultation Incision clean and dry Abdomen soft Extremities warm  Lab Results:  Recent Labs  12/05/13 0500 12/06/13 0504  WBC 10.9* 10.2  HGB 9.4* 9.3*  HCT 27.3* 27.4*  PLT 68* 67*   BMET:  Recent Labs  12/05/13 0500 12/06/13 0504  NA 137 137  K 4.4 4.5  CL 100 100  CO2 23 26  GLUCOSE 139* 115*  BUN 24* 34*  CREATININE 1.23 1.36*  CALCIUM 8.2* 8.3*    PT/INR: No results found for this basename: LABPROT, INR,  in the last 72 hours ABG    Component Value Date/Time   PHART 7.341* 12/03/2013 2134   HCO3 22.3 12/03/2013 2134   TCO2 21 12/04/2013 1712   ACIDBASEDEF 3.0* 12/03/2013 2134   O2SAT 92.0 12/03/2013 2134   CBG (last 3)   Recent Labs  12/06/13 0007 12/06/13 0419  12/06/13 1141  GLUCAP 103* 107* 122*    Assessment/Plan: S/P Procedure(s) (LRB): CORONARY ARTERY BYPASS GRAFTING (CABG) x4 using left internal mammary artery and right greater saphenous vein. LIMA to LAD, sequential SVG to OM 1 & OM 2, SVG to PD (N/A) INTRAOPERATIVE TRANSESOPHAGEAL ECHOCARDIOGRAM (N/A) ENDOVEIN HARVEST OF GREATER SAPHENOUS VEIN (Right) Plan for transfer to step-down: see transfer orders Atrial fibrillation being treated by cardiology who recommended probable anticoagulation  LOS: 3 days    VAN TRIGT III,PETER 12/06/2013

## 2013-12-07 ENCOUNTER — Inpatient Hospital Stay (HOSPITAL_COMMUNITY): Payer: Medicare Other

## 2013-12-07 LAB — CBC
HCT: 26.8 % — ABNORMAL LOW (ref 39.0–52.0)
Hemoglobin: 9.2 g/dL — ABNORMAL LOW (ref 13.0–17.0)
MCH: 32.7 pg (ref 26.0–34.0)
MCHC: 34.3 g/dL (ref 30.0–36.0)
MCV: 95.4 fL (ref 78.0–100.0)
Platelets: 83 10*3/uL — ABNORMAL LOW (ref 150–400)
RBC: 2.81 MIL/uL — ABNORMAL LOW (ref 4.22–5.81)
RDW: 14.6 % (ref 11.5–15.5)
WBC: 7.8 10*3/uL (ref 4.0–10.5)

## 2013-12-07 LAB — BASIC METABOLIC PANEL
Anion gap: 12 (ref 5–15)
BUN: 36 mg/dL — ABNORMAL HIGH (ref 6–23)
CO2: 25 mEq/L (ref 19–32)
Calcium: 8.1 mg/dL — ABNORMAL LOW (ref 8.4–10.5)
Chloride: 99 mEq/L (ref 96–112)
Creatinine, Ser: 1.19 mg/dL (ref 0.50–1.35)
GFR calc Af Amer: 64 mL/min — ABNORMAL LOW (ref >90)
GFR calc non Af Amer: 55 mL/min — ABNORMAL LOW (ref >90)
Glucose, Bld: 105 mg/dL — ABNORMAL HIGH (ref 70–99)
Potassium: 4.2 mEq/L (ref 3.7–5.3)
Sodium: 136 mEq/L — ABNORMAL LOW (ref 137–147)

## 2013-12-07 LAB — GLUCOSE, CAPILLARY
Glucose-Capillary: 103 mg/dL — ABNORMAL HIGH (ref 70–99)
Glucose-Capillary: 118 mg/dL — ABNORMAL HIGH (ref 70–99)
Glucose-Capillary: 107 mg/dL — ABNORMAL HIGH (ref 70–99)

## 2013-12-07 LAB — HEPARIN LEVEL (UNFRACTIONATED): Heparin Unfractionated: 0.18 IU/mL — ABNORMAL LOW (ref 0.30–0.70)

## 2013-12-07 LAB — PROTIME-INR
INR: 1.23 (ref 0.00–1.49)
Prothrombin Time: 15.5 seconds — ABNORMAL HIGH (ref 11.6–15.2)

## 2013-12-07 MED ORDER — WARFARIN - PHYSICIAN DOSING INPATIENT
Freq: Every day | Status: DC
  Administered 2013-12-08: 18:00:00

## 2013-12-07 MED ORDER — METOPROLOL TARTRATE 12.5 MG HALF TABLET
12.5000 mg | ORAL_TABLET | Freq: Two times a day (BID) | ORAL | Status: DC
  Administered 2013-12-07 (×2): 12.5 mg via ORAL
  Filled 2013-12-07 (×4): qty 1

## 2013-12-07 MED ORDER — WARFARIN SODIUM 2.5 MG PO TABS
2.5000 mg | ORAL_TABLET | Freq: Every day | ORAL | Status: DC
  Administered 2013-12-07: 2.5 mg via ORAL
  Filled 2013-12-07 (×2): qty 1

## 2013-12-07 MED ORDER — HEPARIN BOLUS VIA INFUSION
3000.0000 [IU] | Freq: Once | INTRAVENOUS | Status: DC
  Filled 2013-12-07: qty 3000

## 2013-12-07 MED ORDER — LACTULOSE 10 GM/15ML PO SOLN
20.0000 g | Freq: Every day | ORAL | Status: DC | PRN
  Filled 2013-12-07: qty 30

## 2013-12-07 MED ORDER — HEPARIN BOLUS VIA INFUSION
3000.0000 [IU] | Freq: Once | INTRAVENOUS | Status: AC
  Administered 2013-12-07: 3000 [IU] via INTRAVENOUS
  Filled 2013-12-07: qty 3000

## 2013-12-07 MED ORDER — HEPARIN (PORCINE) IN NACL 100-0.45 UNIT/ML-% IJ SOLN
1200.0000 [IU]/h | INTRAMUSCULAR | Status: DC
  Administered 2013-12-07: 1000 [IU]/h via INTRAVENOUS
  Administered 2013-12-07: 1200 [IU]/h via INTRAVENOUS
  Filled 2013-12-07 (×2): qty 250

## 2013-12-07 NOTE — Progress Notes (Signed)
Patient declined his walk this morning and stated that he wants to wait til afternoon because he felt his HR was too high. Will continue to monitor closely. Lajuana Matteina Nickolas Chalfin, RN

## 2013-12-07 NOTE — Progress Notes (Addendum)
      [address]       [place] [id number]             [phone]      4 Days Post-Op Procedure(s) (LRB): CORONARY ARTERY BYPASS GRAFTING (CABG) x4 using left internal mammary artery and right greater saphenous vein. LIMA to LAD, sequential SVG to OM 1 & OM 2, SVG to PD (N/A) INTRAOPERATIVE TRANSESOPHAGEAL ECHOCARDIOGRAM (N/A) ENDOVEIN HARVEST OF GREATER SAPHENOUS VEIN (Right)  Subjective:  [name] states he is doing okay.  He converted back into rapid Atrial Fibrillation overnight.  Currently his rate is in the low 100s.  He is ambulating.  No BM  Objective: Vital signs in last 24 hours: Temp:  [98.2 F (36.8 C)-98.7 F (37.1 C)] 98.7 F (37.1 C) (07/12 0458) Pulse Rate:  [72-145] 72 (07/12 0458) Cardiac Rhythm:  [-] Atrial fibrillation (07/12 0458) Resp:  [11-22] 16 (07/12 0458) BP: (79-145)/(49-106) 101/74 mmHg (07/12 0458) SpO2:  [90 %-95 %] 93 % (07/12 0458) Weight:  [166 lb 3.2 oz (75.388 kg)] 166 lb 3.2 oz (75.388 kg) (07/12 0458)  Intake/Output from previous day: 07/11 0701 - 07/12 0700 In: 73.4 [I.V.:73.4] Out: 180 [Urine:180]  General appearance: alert, cooperative and no distress Heart: irregularly irregular rhythm Lungs: clear to auscultation bilaterally Abdomen: soft, non-tender; bowel sounds normal; no masses,  no organomegaly Extremities: edema 1+ Wound: clean and dry  Lab Results:  Recent Labs  12/06/13 0504 12/07/13 0415  WBC 10.2 7.8  HGB 9.3* 9.2*  HCT 27.4* 26.8*  PLT 67* 83*   BMET:  Recent Labs  12/06/13 0504 12/07/13 0415  NA 137 136*  K 4.5 4.2  CL 100 99  CO2 26 25  GLUCOSE 115* 105*  BUN 34* 36*  CREATININE 1.36* 1.19  CALCIUM 8.3* 8.1*    PT/INR: No results found for this basename: LABPROT, INR,  in the last 72 hours ABG    Component Value Date/Time   PHART 7.341* 12/03/2013 2134   HCO3 22.3 12/03/2013 2134   TCO2 21 12/04/2013 1712   ACIDBASEDEF 3.0* 12/03/2013 2134   O2SAT 92.0 12/03/2013 2134   CBG  (last 3)   Recent Labs  12/06/13 1628 12/06/13 2150 12/07/13 0635  GLUCAP 104* 106* 103*    Assessment/Plan: S/P Procedure(s) (LRB): CORONARY ARTERY BYPASS GRAFTING (CABG) x4 using left internal mammary artery and right greater saphenous vein. LIMA to LAD, sequential SVG to OM 1 & OM 2, SVG to PD (N/A) INTRAOPERATIVE TRANSESOPHAGEAL ECHOCARDIOGRAM (N/A) ENDOVEIN HARVEST OF GREATER SAPHENOUS VEIN (Right)  1. CV- Atrial Fibrillation- remains on IV Amiodarone, oral Amiodarone will restart low dose Lopressor, will start Coumadin per Cardiology recommendations 2. Pulm- off oxygen, continue IS for atelectasis on CXR 3. Renal- creatinine trending down, remains hypervolemic, continue Lasix 4. GI- constipation, Lactulose prn 5. CBGs controlled, patient not a diabetic will d/c SSIP and fingersticks 6. Dispo- patient converted back into A. Fib, will start Coumadin, Lopressor, Continue Amiodarone   LOS: 4 days    Edgar Thomas 12/07/2013  Rate controlled atrial fibrillation persists Patient feel slightly weakened but will ambulate with assistance IV heparin started per cardiology as bridge to Coumadin for persistent atrial fibrillation

## 2013-12-07 NOTE — Progress Notes (Signed)
With continued low platelet count since surgery still 80,000 and has been as low as 63,000 will stop heparin and avoid exposure to heparin. Has been started on coumadin.  Will check HIT panel  Delight OvensEdward B Zamyah Wiesman MD      [address] [place] [id number] Office [phone]   Beeper [phone]

## 2013-12-07 NOTE — Progress Notes (Addendum)
ANTICOAGULATION CONSULT NOTE - Initial Consult  Pharmacy Consult for heparin Indication: atrial fibrillation  Allergies  Allergen Reactions  . Contrast Media [Iodinated Diagnostic Agents] Rash    [facility]    Patient Measurements: Height: 5\' 9"  (175.3 cm) Weight: 166 lb 3.2 oz (75.388 kg) IBW/kg (Calculated) : 70.7 Heparin Dosing Weight: 75kg  Vital Signs: Temp: 98.7 F (37.1 C) (07/12 0458) Temp src: Oral (07/12 0458) BP: 101/74 mmHg (07/12 0458) Pulse Rate: 72 (07/12 0458)  Labs:  Recent Labs  12/05/13 0500 12/06/13 0504 12/07/13 0415  HGB 9.4* 9.3* 9.2*  HCT 27.3* 27.4* 26.8*  PLT 68* 67* 83*  CREATININE 1.23 1.36* 1.19    Estimated Creatinine Clearance: 48.7 ml/min (by C-G formula based on Cr of 1.19).   Medical History: Past Medical History  Diagnosis Date  . Hyperlipemia   . CAD (coronary artery disease)   . Heartburn   . A-fib   . Hypertension   . Heart attack   . Constipation   . GERD (gastroesophageal reflux disease)     Assessment: [age] YOM s/p CABG x4 on 7/8 who has converted back into AFib and has been cleared to start anticoagulation. Warfarin is being dosed by MD- currently 2.5mg  po q1800 with daily INRs. Baseline coags before CABG were normal. Hgb low but stable post-op, plts are low but beginning to trend up.  Goal of Therapy:  Heparin level 0.3-0.7 units/ml Monitor platelets by anticoagulation protocol: Yes   Plan:  1. Heparin bolus with 3000 units IV x1 2. Heparin drip 1000 units/hr 3. Heparin level in 8 hours 4. Daily heparin level and CBC- will watch very closely 5. Warfarin per MD  Leotis ShamesLauren D. Elice Crigger, PharmD, BCPS Clinical Pharmacist Pager: [phone] 12/07/2013 10:42 AM   ADDENDUM Initial heparin level low at 0.18. No issues with line per RN.   Plan: 1. rebolus with heparin 3000 units IV x1 2. Increase drip to 1200 units/hr (~3unit/kg/hr increase) 3. Next heparin level with AM labs  Peg Fifer  D. Brinkley Peet, PharmD, BCPS Clinical Pharmacist Pager: [phone] 12/07/2013 8:04 PM

## 2013-12-07 NOTE — Evaluation (Signed)
Physical Therapy Evaluation Patient Details Name: Edgar ChurchBilly Thomas MRN: [id number] DOB: 03/31/1930 Today's Date: 12/07/2013   History of Present Illness  Patient is an 80 yo male s/p CABG x4.  PMH: CAD, MI, Afib, HTN  Clinical Impression  Patient presents with problems listed below.  Will benefit from acute PT to maximize independence prior to discharge home with wife.    Follow Up Recommendations No PT follow up;Supervision/Assistance - 24 hour (Recommend Cardiac Rehab Program at discharge.)    Equipment Recommendations  Rolling walker with 5" wheels;3in1 (PT)    Recommendations for Other Services       Precautions / Restrictions Precautions Precautions: Sternal Precaution Comments: Reviewed sternal precautions with patient. Restrictions Weight Bearing Restrictions: No      Mobility  Bed Mobility                  Transfers Overall transfer level: Needs assistance Equipment used: None Transfers: Sit to/from Stand Sit to Stand: Min assist;Mod assist         General transfer comment: Verbal cues for technique.  Patient crosses arms across chest.  Min assist for chair and mod assist for lower toilet.  Cues to power up with LE's.  Ambulation/Gait Ambulation/Gait assistance: Supervision Ambulation Distance (Feet): 300 Feet Assistive device:  (pushing wheelchair) Gait Pattern/deviations: Step-through pattern;Decreased stride length Gait velocity: Decreased Gait velocity interpretation: Below normal speed for age/gender General Gait Details: Verbal cues to stand upright and minimize weight through UE's on AD.  Patient able to ambulate 12' in room from bathroom to chair with no assistive device and min guard assist.  Stairs            Wheelchair Mobility    Modified Rankin (Stroke Patients Only)       Balance                                             Pertinent Vitals/Pain     Home Living Family/patient expects to be discharged  to:: Private residence Living Arrangements: Spouse/significant other Available Help at Discharge: Family;Available 24 hours/day Type of Home: House Home Access: Stairs to enter Entrance Stairs-Rails: None Entrance Stairs-Number of Steps: 3 Home Layout: One level (2 steps down to living room; 1 step up to bedroom) Home Equipment: None      Prior Function Level of Independence: Independent         Comments: Active.  Walks 4 miles per day per patient.     Hand Dominance        Extremity/Trunk Assessment   Upper Extremity Assessment: Overall WFL for tasks assessed           Lower Extremity Assessment: Generalized weakness         Communication   Communication: No difficulties  Cognition Arousal/Alertness: Awake/alert Behavior During Therapy: WFL for tasks assessed/performed Overall Cognitive Status: Within Functional Limits for tasks assessed                      General Comments      Exercises        Assessment/Plan    PT Assessment Patient needs continued PT services  PT Diagnosis Difficulty walking;Generalized weakness;Acute pain   PT Problem List Decreased strength;Decreased activity tolerance;Decreased balance;Decreased mobility;Decreased knowledge of use of DME;Decreased knowledge of precautions;Cardiopulmonary status limiting activity;Pain  PT Treatment Interventions DME instruction;Gait training;Stair training;Functional mobility  training;Patient/family education   PT Goals (Current goals can be found in the Care Plan section) Acute Rehab PT Goals Patient Stated Goal: To get stronger PT Goal Formulation: With patient/family Time For Goal Achievement: 12/14/13 Potential to Achieve Goals: Good    Frequency Min 3X/week   Barriers to discharge        Co-evaluation               End of Session Equipment Utilized During Treatment: Gait belt Activity Tolerance: Patient tolerated treatment well Patient left: in chair;with call  bell/phone within reach;with family/visitor present Nurse Communication: Mobility status         Time: 4540-9811 PT Time Calculation (min): 30 min   Charges:   PT Evaluation $Initial PT Evaluation Tier I: 1 Procedure PT Treatments $Gait Training: 23-37 mins   PT G CodesVena Austria 12/07/2013, 4:05 PM Durenda Hurt. Renaldo Fiddler, [facility] Acute Rehab Services Pager [phone]

## 2013-12-07 NOTE — Progress Notes (Signed)
Patient ID: Edgar Thomas, male   DOB: 09/13/1929, 80 y.o.   MRN: [id number]    Subjective:  Feels well  Wants to shave  Back in afib      Objective:  Filed Vitals:   12/06/13 2300 12/06/13 2330 12/07/13 0030 12/07/13 0458  BP: 134/90 108/78 123/80 101/74  Pulse: 115 108 102 72  Temp:    98.7 F (37.1 C)  TempSrc:    Oral  Resp: 16 18 18 16   Height:      Weight:    166 lb 3.2 oz (75.388 kg)  SpO2: 93% 95% 94% 93%    Intake/Output from previous day:  Intake/Output Summary (Last 24 hours) at 12/07/13 1027 Last data filed at 12/06/13 1500  Gross per 24 hour  Intake      0 ml  Output    105 ml  Net   -105 ml    Physical Exam: Affect appropriate Elderly male  HEENT: normal Neck supple with no adenopathy JVP normal no bruits no thyromegaly Lungs clear with no wheezing and good diaphragmatic motion Heart:  S1/S2 no murmur, no rub, gallop or click Sternotomy  PMI normal Abdomen: benighn, BS positve, no tenderness, no AAA no bruit.  No HSM or HJR Distal pulses intact with no bruits No edema Neuro non-focal Skin warm and dry   Lab Results: Basic Metabolic Panel:  Recent Labs  40/98/1106/02/10 1700  12/06/13 0504 12/07/13 0415  NA  --   < > 137 136*  K  --   < > 4.5 4.2  CL  --   < > 100 99  CO2  --   < > 26 25  GLUCOSE  --   < > 115* 105*  BUN  --   < > 34* 36*  CREATININE 0.99  < > 1.36* 1.19  CALCIUM  --   < > 8.3* 8.1*  MG 2.7*  --   --   --   < > = values in this interval not displayed. CBC:  Recent Labs  12/06/13 0504 12/07/13 0415  WBC 10.2 7.8  HGB 9.3* 9.2*  HCT 27.4* 26.8*  MCV 96.1 95.4  PLT 67* 83*    Imaging: No results found.  Cardiac Studies:  ECG:    Telemetry:  afib rate 110    Echo:   EF 40-45%  Mild MR   Medications:   . acetaminophen  1,000 mg Oral 4 times per day   Or  . acetaminophen (TYLENOL) oral liquid 160 mg/5 mL  1,000 mg Per Tube 4 times per day  . amiodarone  200 mg Oral BID  . aspirin EC  81 mg Oral Daily  .  bisacodyl  10 mg Oral Daily   Or  . bisacodyl  10 mg Rectal Daily  . docusate sodium  200 mg Oral Daily  . feeding supplement (ENSURE COMPLETE)  237 mL Oral Q24H  . folic acid  1 mg Oral Daily  . metoprolol tartrate  12.5 mg Oral BID  . pantoprazole  40 mg Oral Daily  . rosuvastatin  40 mg Oral Daily  . sodium chloride  3 mL Intravenous Q12H  . sodium chloride  3 mL Intravenous Q12H  . warfarin  2.5 mg Oral q1800  . Warfarin - Physician Dosing Inpatient   Does not apply q1800     . sodium chloride Stopped (12/05/13 1500)  . amiodarone 30 mg/hr (12/07/13 1001)    Assessment/Plan:  PAF:  See note from  Dr Landry Mellow  Has had post op bradycardia   Coumadin started  Iv amiodarone  If rates stay above 100 can reinitiate low dose beta blocker like coreg 3.125 bid in am  Should be on heparin  CAD:  Post CABG  EF 40-45% mild MR  Chol:  On statin    Edgar Thomas 12/07/2013, 10:27 AM

## 2013-12-08 LAB — CBC
HCT: 29.3 % — ABNORMAL LOW (ref 39.0–52.0)
Hemoglobin: 10 g/dL — ABNORMAL LOW (ref 13.0–17.0)
MCH: 33 pg (ref 26.0–34.0)
MCHC: 34.1 g/dL (ref 30.0–36.0)
MCV: 96.7 fL (ref 78.0–100.0)
Platelets: 110 10*3/uL — ABNORMAL LOW (ref 150–400)
RBC: 3.03 MIL/uL — ABNORMAL LOW (ref 4.22–5.81)
RDW: 14.8 % (ref 11.5–15.5)
WBC: 7.4 10*3/uL (ref 4.0–10.5)

## 2013-12-08 LAB — BASIC METABOLIC PANEL
Anion gap: 14 (ref 5–15)
BUN: 36 mg/dL — ABNORMAL HIGH (ref 6–23)
CO2: 23 mEq/L (ref 19–32)
Calcium: 8.2 mg/dL — ABNORMAL LOW (ref 8.4–10.5)
Chloride: 97 mEq/L (ref 96–112)
Creatinine, Ser: 1.08 mg/dL (ref 0.50–1.35)
GFR calc Af Amer: 72 mL/min — ABNORMAL LOW (ref >90)
GFR calc non Af Amer: 62 mL/min — ABNORMAL LOW (ref >90)
Glucose, Bld: 132 mg/dL — ABNORMAL HIGH (ref 70–99)
Potassium: 4 mEq/L (ref 3.7–5.3)
Sodium: 134 mEq/L — ABNORMAL LOW (ref 137–147)

## 2013-12-08 LAB — PROTIME-INR
INR: 1.16 (ref 0.00–1.49)
Prothrombin Time: 14.8 seconds (ref 11.6–15.2)

## 2013-12-08 MED ORDER — FUROSEMIDE 40 MG PO TABS
40.0000 mg | ORAL_TABLET | Freq: Every day | ORAL | Status: DC
  Administered 2013-12-08 – 2013-12-11 (×4): 40 mg via ORAL
  Filled 2013-12-08 (×4): qty 1

## 2013-12-08 MED ORDER — POTASSIUM CHLORIDE CRYS ER 20 MEQ PO TBCR
20.0000 meq | EXTENDED_RELEASE_TABLET | Freq: Every day | ORAL | Status: DC
  Administered 2013-12-08 – 2013-12-11 (×4): 20 meq via ORAL
  Filled 2013-12-08 (×4): qty 1

## 2013-12-08 MED ORDER — WARFARIN VIDEO
Freq: Once | Status: AC
  Administered 2013-12-08: 14:00:00

## 2013-12-08 MED ORDER — COUMADIN BOOK
Freq: Once | Status: AC
  Administered 2013-12-08: 15:00:00
  Filled 2013-12-08: qty 1

## 2013-12-08 MED ORDER — WARFARIN SODIUM 5 MG PO TABS
5.0000 mg | ORAL_TABLET | Freq: Once | ORAL | Status: AC
  Administered 2013-12-08: 5 mg via ORAL
  Filled 2013-12-08: qty 1

## 2013-12-08 MED ORDER — METOPROLOL TARTRATE 25 MG PO TABS
25.0000 mg | ORAL_TABLET | Freq: Two times a day (BID) | ORAL | Status: DC
  Administered 2013-12-08 – 2013-12-11 (×6): 25 mg via ORAL
  Filled 2013-12-08 (×8): qty 1

## 2013-12-08 NOTE — Progress Notes (Signed)
Donielle PA made aware of BP of 98/68, orders received to hold AM Beta Blocker, will continue to monitor pt, pt complains of no dizziness or lightheadedness Edgar Thomas, Marsena Taff G, RN

## 2013-12-08 NOTE — Discharge Summary (Signed)
Physician Discharge Summary       [address]       [place] [address]             [phone]    Patient ID: Edgar Thomas MRN: [id number] DOB/AGE: 08/28/1929 80 y.o.  Admit date: 12/03/2013 Discharge date: 12/09/2013  Admission Diagnoses: 1. History of CAD (s/p MI, PCI) 2. History of hyperlipidemia 3. History of hypertension 4. History of GERD  Discharge Diagnoses:  1. History of CAD (s/p MI, PCI) 2. History of hyperlipidemia 3. History of hypertension 4. History of GERD 5. ABL anemia 6. Thrombocytopenia 7. Pre diabetic-HGA1C 5.8   Consults: cardiology  Procedure (s):  Coronary artery bypass grafting x4 with the left  internal mammary to left anterior descending coronary artery, sequential reverse saphenous vein graft to the first and second obtuse marginal, reverse saphenous vein graft to the posterior descending with right thigh and calf endo vein harvesting by Dr. Tyrone Sage on 12/03/2013.  History of Presenting Illness: This is an 80 y.o. male is seen in the office at the patient's request for coronary artery disease. The patient has a long history of coronary artery disease. He first had a myocardial infarction treated at [facility] by Dr. Theron Arista with angioplasty of the LAD 23 years ago. Approximately, 19 years ago he had a repeat angioplasty of distal right coronary artery branch. Since that time, he's done relatively well with changes of lifestyle and diet through a wellness program in [place]. One week ago, while at home, he was awakened by chest discomfort and because of concern of a long wait in the [facility] Emergency Room, drove himself from [place] to [place]. He was admitted to [facility]. Echocardiogram and cardiac catheterization were performed on 11/17/2013 by Dr Rolene Arbour. The patient notes that he saw a surgeon in the hospital but decided to delay in making a decision and was discharged home. He comes to the office today for a second opinion  because I had previously operated on his son. It should be noted in his cardiology consultation that he presented with episode of atrial fibrillation troponin elevation to 15.  Since discharge home, he is remained inactive. He notde brief episode of chest discomfort and anxiety at 3 am. Symptoms resolved after single Nitroglycerin. Echocardiogram showed left ventricular size mildly dilated, ejection fraction 40-45%, mild mitral regurgitation, and mild aortic regurgitation. Cardiac catheterization shows diffuse disease of at least 50% left main, LAD has a 99% stenosis at the ostium, circumflex is a medium caliber nondominant vessel with a 90% ostial stenosis and a second 90% stenosis in the mid circumflex, the right coronary artery is heavily calcified, and 80% proximal PDA stenosis the right supplies collaterals to the left anterior descending. Dr. Tyrone Sage discussed the need for coronary artery bypass grafting surgery. Potential risks, benefits, and complications were discussed with the patient and he agreed to proceed with surgery. He underwent a CABG x 4 on 12/03/2013.  Brief Hospital Course:  The patient was extubated the evening of surgery without difficulty. He remained afebrile and hemodynamically stable. He initially was A paced. He was placed on an Amiodarone drip for a fib.Theone Murdoch, a line, chest tubes, and foley were removed early in the post operative course. Lopressor was started and titrated accordingly. He was volume over loaded and diuresed. He was weaned off the insulin drip.  The patient's HGA1C pre op was 5.8. He will require further surveillance as an outpatient. He did have thrombocytopenia post op. His  last platelet count was up to 110,000. HIT is negative. He also had ABL anemia. He did not require a transfusion. His last H and H was up to 10 and 29.3.The patient was felt surgically stable for transfer from the ICU to PCTU for further convalescence on 12/07/2012. He did convert to sinus  rhythm and is is currently on Amiodarone 200 bid, Lopressor 25 bid, and Coumadin. His INR is 3.14. His INR should be between 2-2.5 after discharge. He continues to progress with cardiac rehab. He was ambulating on room air. He has been tolerating a diet and has had a bowel movement. Epicardial pacing wires and chest tube sutures will be removed prior to discharge. Provided the patient remains afebrile, hemodynamically stable, and pending morning round evaluation, he will be surgically stable for discharge on 12/11/2012.    Latest Vital Signs: Blood pressure 121/69, pulse 75, temperature 98 F (36.7 C), temperature source Oral, resp. rate 18, height 5\' 9"  (1.753 m), weight 168 lb 6.4 oz (76.386 kg), SpO2 92.00%.  Physical Exam: Cardiovascular: IRRR IRRR  Pulmonary: Slightly diminished at bases; no rales, wheezes, or rhonchi.  Abdomen: Soft, non tender, bowel sounds present.  Extremities: Mild bilateral lower extremity edema. Ecchymosis RLE  Wounds: Clean and dry. No erythema or signs of infection.   Discharge Condition:Stable  Recent laboratory studies:  Lab Results  Component Value Date   WBC 7.3 12/09/2013   HGB 9.1* 12/09/2013   HCT 26.9* 12/09/2013   MCV 94.7 12/09/2013   PLT 127* 12/09/2013   Lab Results  Component Value Date   NA 134* 12/08/2013   K 4.0 12/08/2013   CL 97 12/08/2013   CO2 23 12/08/2013   CREATININE 1.08 12/08/2013   GLUCOSE 132* 12/08/2013      Diagnostic Studies: Dg Chest 2 View  12/07/2013   CLINICAL DATA:  Post CABG  EXAM: CHEST  2 VIEW  COMPARISON:  12/05/2013; 12/04/2013; 12/03/2013  FINDINGS: Grossly unchanged enlarged cardiac silhouette and mediastinal contours post median sternotomy and CABG. Interval removal of right jugular approach vascular sheath and left-sided chest tube. No pneumothorax. Epicardial leads remain. Grossly unchanged small left-sided pleural effusion with associated bibasilar opacities, left greater than right. Lung hyperexpansion is again  suspected. No evidence of edema. Unchanged bones.  IMPRESSION: 1. Interval removal of support apparatus.  No pneumothorax. 2. Grossly unchanged small left-sided effusion with associated bibasilar opacities, left greater than right, likely atelectasis. 3. No evidence of edema.   Electronically Signed   By: Simonne Come M.D.   On: 12/07/2013 11:43   Discharge Medications:    Medication List    STOP taking these medications       aspirin 325 MG tablet  Replaced by:  aspirin 81 MG EC tablet     nitroGLYCERIN 0.4 MG SL tablet  Commonly known as:  NITROSTAT     ramipril 2.5 MG capsule  Commonly known as:  ALTACE      TAKE these medications       amiodarone 200 MG tablet  Commonly known as:  PACERONE  Take 1 tablet (200 mg total) by mouth 2 (two) times daily. For 7 Days, then decrease to 200 mg daily     aspirin 81 MG EC tablet  Take 1 tablet (81 mg total) by mouth daily.     furosemide 40 MG tablet  Commonly known as:  LASIX  Take 1 tablet (40 mg total) by mouth daily. For 7 Days     metoprolol tartrate 25  MG tablet  Commonly known as:  LOPRESSOR  Take 1 tablet (25 mg total) by mouth 2 (two) times daily.     multivitamin capsule  Take 1 capsule by mouth daily.     NETI POT SINUS WASH NA  Place 1 application into the nose daily as needed (congestion).     niacin 1000 MG CR tablet  Commonly known as:  NIASPAN  Take 1,000 mg by mouth at bedtime.     Omega 3 1000 MG Caps  Take 1,000 mg by mouth daily.     omeprazole 20 MG capsule  Commonly known as:  PRILOSEC  Take 20 mg by mouth daily as needed (acid reflux).     oxyCODONE 5 MG immediate release tablet  Commonly known as:  Oxy IR/ROXICODONE  Take 1-2 tablets (5-10 mg total) by mouth every 3 (three) hours as needed for moderate pain.     potassium chloride SA 20 MEQ tablet  Commonly known as:  K-DUR,KLOR-CON  Take 1 tablet (20 mEq total) by mouth daily. For 7 Days     rosuvastatin 40 MG tablet  Commonly known as:   CRESTOR  Take 40 mg by mouth daily.     Selenium 100 MCG Caps  Take 100 mcg by mouth daily as needed (muscle support).     Vitamin D (Cholecalciferol) 1000 UNITS Tabs  Take 1,000 Units by mouth daily.     warfarin 2.5 MG tablet  Commonly known as:  COUMADIN  Take 1 tablet (2.5 mg total) by mouth daily at 6 PM.         The patient has been discharged on:   1.Beta Blocker:  Yes [ x  ]                              No   [   ]                              If No, reason:  2.Ace Inhibitor/ARB: Yes [   ]                                     No  [  x  ]                                     If No, reason:Labile BP  3.Statin:   Yes [ x  ]                  No  [   ]                  If No, reason:  4.Ecasa:  Yes  [  x ]                  No   [   ]                  If No, reason:  Follow Up Appointments: Follow-up Information   Follow up with Abelino DerrickKILROY,LUKE K, PA-C On 01/02/2014. (Appointment time is at 9:00 am)    Specialty:  Cardiology   Contact information:   [address] [place] [address] [phone]  Follow up with Delight Ovens, MD On 01/08/2014. (PA/LAT CXR to be taken (at [facility] which is in the same [place] as Dr. Dennie Maizes office) on 01/08/2014 at 11:30 am;Appointment with Dr. Tyrone Sage is at 12:30 pm)    Specialty:  Cardiothoracic Surgery   Contact information:   [address] [place] [address] [phone]       Follow up with Konrad Felix, MD. (Call for a follow up appointment regarding further surveillance of HGA1C 5.8)    Specialty:  Family Medicine   Contact information:   [address], [place] [address] [phone]       Follow up with [facility] On 12/12/2013. (PT and INR (as is on Coumadin) is to be drawn at 10:30 am)    Contact information:   [address] [place] [address]       SignedDoree Fudge MPA-C 12/09/2013, 2:37 PM

## 2013-12-08 NOTE — Progress Notes (Signed)
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 No bleeding issue. Denies any fever or chill  CURRENT MEDS . acetaminophen  1,000 mg Oral 4 times per day   Or  . acetaminophen (TYLENOL) oral liquid 160 mg/5 mL  1,000 mg Per Tube 4 times per day  . amiodarone  200 mg Oral BID  . aspirin EC  81 mg Oral Daily  . bisacodyl  10 mg Oral Daily   Or  . bisacodyl  10 mg Rectal Daily  . docusate sodium  200 mg Oral Daily  . feeding supplement (ENSURE COMPLETE)  237 mL Oral Q24H  . folic acid  1 mg Oral Daily  . furosemide  40 mg Oral Daily  . metoprolol tartrate  25 mg Oral BID  . pantoprazole  40 mg Oral Daily  . potassium chloride  20 mEq Oral Daily  . rosuvastatin  40 mg Oral Daily  . sodium chloride  3 mL Intravenous Q12H  . sodium chloride  3 mL Intravenous Q12H  . warfarin  5 mg Oral ONCE-1800  . Warfarin - Physician Dosing Inpatient   Does not apply q1800    OBJECTIVE  Filed Vitals:   12/07/13 2111 12/08/13 0517 12/08/13 0811 12/08/13 0907  BP: 115/78 109/77 98/63 117/87  Pulse: 71 120 111   Temp: 98.2 F (36.8 C) 97.9 F (36.6 C)    TempSrc: Oral Oral    Resp: 16 18    Height:      Weight:  168 lb 11.2 oz (76.522 kg)    SpO2: 94% 94%      Intake/Output Summary (Last 24 hours) at 12/08/13 0952 Last data filed at 12/08/13 0900  Gross per 24 hour  Intake    435 ml  Output    250 ml  Net    185 ml   Filed Weights   12/06/13 0500 12/07/13 0458 12/08/13 0517  Weight: 166 lb 0.1 oz (75.3 kg) 166 lb 3.2 oz (75.388 kg) 168 lb 11.2 oz (76.522 kg)    PHYSICAL EXAM  General: Pleasant, NAD. Family present Neuro: Alert and oriented X 3. Moves all extremities spontaneously. Psych: Normal affect. HEENT:  Normal  Neck: Supple without  bruits or JVD. Lungs:  Resp regular and unlabored. Minimal airmovement in bilateral basis. Otherwise, CTA without significant rale, rhonchi or wheezing Heart: RRR no s3, s4, or murmurs. Midsternal incision noted, clean/dry/intact, without bleeding or drainage. No erythema Abdomen: Soft, non-tender, non-distended, BS + x 4.  Extremities: No clubbing, cyanosis or edema. DP/PT/Radials 2+ and equal bilaterally.  Accessory Clinical Findings  CBC  Recent Labs  12/07/13 0415 12/08/13 0530  WBC 7.8 7.4  HGB 9.2* 10.0*  HCT 26.8* 29.3*  MCV 95.4 96.7  PLT 83* 110*   Basic Metabolic Panel  Recent Labs  12/07/13 0415 12/08/13 0530  NA 136* 134*  K 4.2 4.0  CL 99 97  CO2 25 23  GLUCOSE 105* 132*  BUN 36* 36*  CREATININE 1.19 1.08  CALCIUM 8.1* 8.2*    TELE  A-fib with HR 90-110s  ECG  No recent EKG  Radiology/Studies  Dg Chest 2 View  12/07/2013   CLINICAL DATA:  Post CABG  EXAM:  CHEST  2 VIEW  COMPARISON:  12/05/2013; 12/04/2013; 12/03/2013  FINDINGS: Grossly unchanged enlarged cardiac silhouette and mediastinal contours post median sternotomy and CABG. Interval removal of right jugular approach vascular sheath and left-sided chest tube. No pneumothorax. Epicardial leads remain. Grossly unchanged small left-sided pleural effusion with associated bibasilar opacities, left greater than right. Lung hyperexpansion is again suspected. No evidence of edema. Unchanged bones.  IMPRESSION: 1. Interval removal of support apparatus.  No pneumothorax. 2. Grossly unchanged small left-sided effusion with associated bibasilar opacities, left greater than right, likely atelectasis. 3. No evidence of edema.   Electronically Signed   By: Simonne ComeJohn  Watts M.D.   On: 12/07/2013 11:43   Dg Chest 2 View  12/01/2013   CLINICAL DATA:  Pre Cardiac Surgery  EXAM: CHEST  2 VIEW  COMPARISON:  None.  FINDINGS: The heart size and mediastinal contours are within normal limits. Aorta is tortuous. Both lungs are  clear. The visualized skeletal structures are unremarkable.  IMPRESSION: No active cardiopulmonary disease.   Electronically Signed   By: Salome HolmesHector  Cooper M.D.   On: 12/01/2013 14:56   Dg Chest Port 1 View  12/05/2013   CLINICAL DATA:  post op check chest tube  EXAM: PORTABLE CHEST - 1 VIEW  COMPARISON:  Yesterday  FINDINGS: Swan-Ganz catheter removed with the right internal jugular introducer venous sheath and placed. Chest and mediastinal tubes stable. No pneumothorax. No CHF. Low volumes with basilar atelectasis left greater than right is worse. Vascular congestion has resolved.  IMPRESSION: Resolved vascular congestion.  No pneumothorax or CHF.  Bibasilar atelectasis worse.   Electronically Signed   By: Maryclare BeanArt  Hoss M.D.   On: 12/05/2013 07:42   Dg Chest Portable 1 View In Am  12/04/2013   CLINICAL DATA:  Postop CABG.  Extubation.  EXAM: PORTABLE CHEST - 1 VIEW  COMPARISON:  Portable chest x-ray yesterday. Two-view chest x-ray 12/01/2013.  FINDINGS: Sternotomy for CABG. Cardiac silhouette mildly to moderately enlarged but stable. Mild pulmonary venous hypertension without overt edema. Left lower lobe atelectasis, unchanged. Minimal linear atelectasis right mid lung. Interval extubation. Swan-Ganz catheter tip overlies the expected location of the main pulmonary trunk. Left chest tube in place with no pneumothorax. Mediastinal drainage tube.  IMPRESSION: Support apparatus satisfactory. No pneumothorax. Stable atelectasis in the left lower lobe. Minimal linear atelectasis in the right mid lung.   Electronically Signed   By: Hulan Saashomas  Lawrence M.D.   On: 12/04/2013 07:32   Dg Chest Portable 1 View  12/03/2013   CLINICAL DATA:  Status post coronary bypass graft  EXAM: PORTABLE CHEST - 1 VIEW  COMPARISON:  12/01/2013  FINDINGS: Cardiac shadow is mildly enlarged. A nasogastric catheter is noted within the stomach. A mediastinal drain and left thoracostomy catheter are seen. No pneumothorax or pleural effusion is  noted. A Swan-Ganz catheter is noted in the pulmonary outflow tract. An endotracheal tube is seen 4.9 cm above the carina. No other focal abnormality is seen.  IMPRESSION: Postoperative change with tubes and lines as described. No acute abnormality is noted.   Electronically Signed   By: Alcide CleverMark  Lukens M.D.   On: 12/03/2013 14:51    ASSESSMENT AND PLAN  1. PAF - also had post op brady  - back on lopressor, dose increased to 25mg  BID this morning. Did not take morning dose with SBP 90s  - continue amiodarone and coumadin  2. CAD  - echo EF 40-45%  - continue diuretic  3. Hyperlipidemia  Signed, Azalee CourseMeng, Hao PA-C  Pager: [phone]  Agree with above assessment.  The patient is in no distress.  He himself is not aware of his heart rhythm.  On IV amiodarone.  On warfarin and beta blocker.  Exam reveals decreased breath sounds at the bases.  No pericardial rub.

## 2013-12-08 NOTE — Care Management Note (Signed)
    Page 1 of 1   12/11/2013     10:53:41 AM CARE MANAGEMENT NOTE 12/11/2013  Patient:  Edgar Thomas,Edgar Thomas   Account Number:  [id number]  Date Initiated:  12/08/2013  Documentation initiated by:  Wolfe Camarena  Subjective/Objective Assessment:   Pt adm s/p CABG x 4 on 12/03/13.  PTA, pt independent, lives with spouse.     Action/Plan:   Wife to provide 24h care at dc; pt requests walker and 3 in 1 for home.  Will arrange.  Denies any other home needs. Will follow progress.   Anticipated DC Date:  12/10/2013   Anticipated DC Plan:  HOME/SELF CARE      DC Planning Services  CM consult      Choice offered to / List presented to:     DME arranged  WALKER - ROLLING  BEDSIDE COMMODE      DME agency  [facility]        Status of service:  In process, will continue to follow Medicare Important Message given?  YES (If response is "NO", the following Medicare IM given date fields will be blank) Date Medicare IM given:  12/08/2013 Medicare IM given by:  Odel Schmid Date Additional Medicare IM given:  12/11/2013 Additional Medicare IM given by:  Horace Lukas  Discharge Disposition:  HOME/SELF CARE  Per UR Regulation:  Reviewed for med. necessity/level of care/duration of stay  If discussed at Long Length of Stay Meetings, dates discussed:   12/11/2013    Comments:  12/11/13 Sidney AceJulie Phala Schraeder, RN, BSN [phone] Pt for dc home today with spouse.  RW and 3 in [facility]

## 2013-12-08 NOTE — Progress Notes (Addendum)
      [address]       [place], [id number]             [phone]        5 Days Post-Op Procedure(s) (LRB): CORONARY ARTERY BYPASS GRAFTING (CABG) x4 using left internal mammary artery and right greater saphenous vein. LIMA to LAD, sequential SVG to OM 1 & OM 2, SVG to PD (N/A) INTRAOPERATIVE TRANSESOPHAGEAL ECHOCARDIOGRAM (N/A) ENDOVEIN HARVEST OF GREATER SAPHENOUS VEIN (Right)  Subjective: Patient without specific complaints  Objective: Vital signs in last 24 hours: Temp:  [97.9 F (36.6 C)-98.2 F (36.8 C)] 97.9 F (36.6 C) (07/13 0517) Pulse Rate:  [71-120] 120 (07/13 0517) Cardiac Rhythm:  [-] Ventricular paced (07/12 2111) Resp:  [16-18] 18 (07/13 0517) BP: (100-115)/(69-78) 109/77 mmHg (07/13 0517) SpO2:  [94 %-95 %] 94 % (07/13 0517) Weight:  [168 lb 11.2 oz (76.522 kg)] 168 lb 11.2 oz (76.522 kg) (07/13 0517)  Pre op weight 69 kg Current Weight  12/08/13 168 lb 11.2 oz (76.522 kg)      Intake/Output from previous day: 07/12 0701 - 07/13 0700 In: 195 [P.O.:120; I.V.:75] Out: 250 [Urine:250]   Physical Exam:  Cardiovascular: IRRR IRRR Pulmonary: Slightly diminished at bases; no rales, wheezes, or rhonchi. Abdomen: Soft, non tender, bowel sounds present. Extremities: Mild bilateral lower extremity edema. Ecchymosis RLE Wounds: Clean and dry.  No erythema or signs of infection.  Lab Results: CBC: Recent Labs  12/07/13 0415 12/08/13 0530  WBC 7.8 7.4  HGB 9.2* 10.0*  HCT 26.8* 29.3*  PLT 83* 110*   BMET:  Recent Labs  12/07/13 0415 12/08/13 0530  NA 136* 134*  K 4.2 4.0  CL 99 97  CO2 25 23  GLUCOSE 105* 132*  BUN 36* 36*  CREATININE 1.19 1.08  CALCIUM 8.1* 8.2*    PT/INR:  Lab Results  Component Value Date   INR 1.16 12/08/2013   INR 1.23 12/07/2013   INR 1.82* 12/03/2013   ABG:  INR: Will add last result for INR, ABG once components are confirmed Will add last 4 CBG results once components are  confirmed  Assessment/Plan:  1. CV - A fib with CVR (HR in the 90's). On Amiodarone drip, Amiodarone 200 bid,Lopressor 12.5 bid, and Coumadin. Will increase Lopressor to 25 bid. INR slightly decreased to 1.08 so will give Coumadin 5 tonight. Likely stop drip and continue oral Amiodarone. 2.  Pulmonary - Encourage incentive spirometer 3. Volume Overload - On Lasix 40 daily 4.  Acute blood loss anemia - H and H up to 10 and 29.3 5.Thrombocytopenia-platelets up to 110,000. HIT results pending 6. Mild hyponatremia-likely secondary to diuresis 7. Remove EPW in am  ZIMMERMAN,[name] 12/08/2013,7:53 AM  Plt, increased to110,00 today heparin stopped due to low plts, on coumadin for afib I have seen and examined Edgar Thomas and agree with the above assessment  and plan.  Delight OvensEdward B Aryaa Bunting MD Beeper [phone] Office [phone] 12/08/2013 12:51 PM

## 2013-12-08 NOTE — Progress Notes (Signed)
CARDIAC REHAB PHASE I   PRE:  Rate/Rhythm: 111 afib    BP: sitting 104/74    SaO2: 95 RA  MODE:  Ambulation: 350 ft   POST:  Rate/Rhythm: 120 afib    BP: sitting 121/75     SaO2: 95 RA  Tolerated well with RW. Fairly steady, some SOB but no major c/o. HR 110-120s. Return to recliner. Encouraged one more walk today. [phone]   Edgar Thomas, Edgar Thomas CES, ACSM 12/08/2013 2:12 PM

## 2013-12-08 NOTE — Progress Notes (Signed)
Physical Therapy Treatment Patient Details Name: Edgar Thomas MRN: [id number] DOB: 12/04/1929 Today's Date: 12/08/2013    History of Present Illness Patient is an 80 yo male s/p CABG x4.  PMH: CAD, MI, Afib, HTN    PT Comments    Patient continues to require VC for compliance with sternal precautions. Reviewed sternal precautions with patient and wife. Increased ambulation distance without difficulty. SOB present post ambulation. Difficulty with turns when using RW. Very supportive wife present in room. Will continue to follow and assess ability to negotiate steps next session as tolerated.   Follow Up Recommendations  No PT follow up;Supervision/Assistance - 24 hour     Equipment Recommendations  3in1 (PT);Rolling walker with 5" wheels    Recommendations for Other Services       Precautions / Restrictions Precautions Precautions: Sternal Precaution Comments: Reviewed sternal precautions with patient. Restrictions Weight Bearing Restrictions: No (sternal precautions)    Mobility  Bed Mobility Overal bed mobility: Needs Assistance Bed Mobility: Supine to Sit;Sit to Supine     Supine to sit: Min guard;HOB elevated Sit to supine: Min assist   General bed mobility comments: VC for technique to refrain from utilizing UEs to push up off bed to get trunk to EOB. Used heart pillow as reminder. Required Min A with LE to return to supine.  Transfers Overall transfer level: Needs assistance Equipment used: None Transfers: Sit to/from Stand Sit to Stand: Min assist         General transfer comment: VC for technique and to cross arms across chest. VC for anterior translation and use of body momentum to stand. Sit to stand x3.  Ambulation/Gait Ambulation/Gait assistance: Supervision Ambulation Distance (Feet): 350 Feet Assistive device: Rolling walker (2 wheeled) Gait Pattern/deviations: Step-through pattern;Decreased stride length Gait velocity: Decreased   General Gait  Details: VC for upright posture and to minimze weight through UEs on AD. Difficulty with turns and staying witihin walker.   Stairs            Wheelchair Mobility    Modified Rankin (Stroke Patients Only)       Balance Overall balance assessment: Needs assistance Sitting-balance support: No upper extremity supported;Feet supported Sitting balance-Leahy Scale: Fair     Standing balance support: Bilateral upper extremity supported;During functional activity Standing balance-Leahy Scale: Poor Standing balance comment: Use of RW for support.                    Cognition Arousal/Alertness: Awake/alert Behavior During Therapy: WFL for tasks assessed/performed Overall Cognitive Status: Within Functional Limits for tasks assessed                      Exercises      General Comments General comments (skin integrity, edema, etc.): Mild swelling and ecchymosis noted in RLE towards ankle.      Pertinent Vitals/Pain Reports 0/10 pain. BP post ambulation 117/87. HR ranged from 95-116 bpm throughout session, still in A-fib.     Home Living                      Prior Function            PT Goals (current goals can now be found in the care plan section) Progress towards PT goals: Progressing toward goals    Frequency  Min 3X/week    PT Plan Current plan remains appropriate    Co-evaluation  End of Session Equipment Utilized During Treatment: Gait belt Activity Tolerance: Patient tolerated treatment well Patient left: in bed;with call bell/phone within reach;with family/visitor present     Time: [id number] PT Time Calculation (min): 35 min  Charges:  $Gait Training: 8-22 mins $Therapeutic Activity: 8-22 mins                    G CodesAlvie Heidelberg:      Folan, Regina Ganci A 12/08/2013, 9:25 AM Alvie HeidelbergShauna Folan, PT, DPT [phone]

## 2013-12-08 NOTE — Progress Notes (Signed)
Patient refused walk tonight due to being exhausted from the night before. Patient educated. Will continue to monitor. Patient placed back into bed. Call bell within reach.  Valinda HoarLexie Jacarius Handel RN

## 2013-12-08 NOTE — Progress Notes (Signed)
Pt ambulated with RW and assist x1 approximately 150 ft.  Steady pace with no c/o.  HR 120s, scheduled lopressor and amio administered.  Pt back to recliner with call bell in reach.  Wife at bedside.  Will continue to monitor closely.

## 2013-12-09 DIAGNOSIS — I255 Ischemic cardiomyopathy: Secondary | ICD-10-CM | POA: Diagnosis present

## 2013-12-09 LAB — PROTIME-INR
INR: 1.36 (ref 0.00–1.49)
Prothrombin Time: 16.8 seconds — ABNORMAL HIGH (ref 11.6–15.2)

## 2013-12-09 LAB — CBC
HCT: 26.9 % — ABNORMAL LOW (ref 39.0–52.0)
Hemoglobin: 9.1 g/dL — ABNORMAL LOW (ref 13.0–17.0)
MCH: 32 pg (ref 26.0–34.0)
MCHC: 33.8 g/dL (ref 30.0–36.0)
MCV: 94.7 fL (ref 78.0–100.0)
Platelets: 127 10*3/uL — ABNORMAL LOW (ref 150–400)
RBC: 2.84 MIL/uL — ABNORMAL LOW (ref 4.22–5.81)
RDW: 14.3 % (ref 11.5–15.5)
WBC: 7.3 10*3/uL (ref 4.0–10.5)

## 2013-12-09 MED ORDER — FERROUS SULFATE 325 (65 FE) MG PO TABS
325.0000 mg | ORAL_TABLET | Freq: Every day | ORAL | Status: DC
  Administered 2013-12-09 – 2013-12-11 (×3): 325 mg via ORAL
  Filled 2013-12-09 (×6): qty 1

## 2013-12-09 MED ORDER — WARFARIN SODIUM 5 MG PO TABS
5.0000 mg | ORAL_TABLET | Freq: Once | ORAL | Status: AC
  Administered 2013-12-09: 5 mg via ORAL
  Filled 2013-12-09: qty 1

## 2013-12-09 NOTE — Progress Notes (Addendum)
      [address]       ,[place] [id number]             [phone]        6 Days Post-Op Procedure(s) (LRB): CORONARY ARTERY BYPASS GRAFTING (CABG) x4 using left internal mammary artery and right greater saphenous vein. LIMA to LAD, sequential SVG to OM 1 & OM 2, SVG to PD (N/A) INTRAOPERATIVE TRANSESOPHAGEAL ECHOCARDIOGRAM (N/A) ENDOVEIN HARVEST OF GREATER SAPHENOUS VEIN (Right)  Subjective: Patient's only complaint is sore throat. He states Doxycycline usually helps.  Objective: Vital signs in last 24 hours: Temp:  [97.9 F (36.6 C)-98.2 F (36.8 C)] 98 F (36.7 C) (07/14 0429) Pulse Rate:  [73-111] 73 (07/14 0429) Cardiac Rhythm:  [-] Ventricular paced;Atrial fibrillation (07/13 2029) Resp:  [18] 18 (07/14 0429) BP: (98-122)/(60-87) 122/76 mmHg (07/14 0429) SpO2:  [92 %-97 %] 92 % (07/14 0429) Weight:  [168 lb 6.4 oz (76.386 kg)] 168 lb 6.4 oz (76.386 kg) (07/14 0429)  Pre op weight 69 kg Current Weight  12/09/13 168 lb 6.4 oz (76.386 kg)      Intake/Output from previous day: 07/13 0701 - 07/14 0700 In: 480 [P.O.:480] Out: 975 [Urine:975]   Physical Exam:  Cardiovascular: RRR Pulmonary: Slightly diminished at bases; no rales, wheezes, or rhonchi. Abdomen: Soft, non tender, bowel sounds present. Extremities: Mild bilateral lower extremity edema. Ecchymosis RLE Wounds: Clean and dry.  No erythema or signs of infection.  Lab Results: CBC:  Recent Labs  12/08/13 0530 12/09/13 0410  WBC 7.4 7.3  HGB 10.0* 9.1*  HCT 29.3* 26.9*  PLT 110* 127*   BMET:   Recent Labs  12/07/13 0415 12/08/13 0530  NA 136* 134*  K 4.2 4.0  CL 99 97  CO2 25 23  GLUCOSE 105* 132*  BUN 36* 36*  CREATININE 1.19 1.08  CALCIUM 8.1* 8.2*    PT/INR:  Lab Results  Component Value Date   INR 1.36 12/09/2013   INR 1.16 12/08/2013   INR 1.23 12/07/2013   ABG:  INR: Will add last result for INR, ABG once components are confirmed Will add last 4 CBG  results once components are confirmed  Assessment/Plan:  1. CV - Previous a fib with RVR. SR in the 70's this am. On Amiodarone 200 bid,Lopressor 25 bid, and Coumadin. INR increased to 1.36 so will continue Coumadin 5 tonight.  2.  Pulmonary - Encourage incentive spirometer 3. Volume Overload - On Lasix 40 daily 4.  Acute blood loss anemia - H and H up to 9.1 and 26.9. Start Ferrous sulfate 5.Thrombocytopenia-platelets up to 127,000. HIT results pending 6. Remove EPW  7. Possibly home in am  ZIMMERMAN,[name] 12/09/2013,7:29 AM   Now back in sinus Feels better Poss home in am I have seen and examined Edgar Thomas and agree with the above assessment  and plan.  Edgar Thomas Edgar Spiewak MD Beeper [phone] Office [phone] 12/09/2013 7:50 AM

## 2013-12-09 NOTE — Progress Notes (Signed)
Subjective:  Weak but otherwise no compalints  Objective:  Vital Signs in the last 24 hours: Temp:  [97.9 F (36.6 C)-98.2 F (36.8 C)] 98 F (36.7 C) (07/14 0429) Pulse Rate:  [64-106] 64 (07/14 0845) Resp:  [18] 18 (07/14 0429) BP: (101-128)/(52-76) 104/52 mmHg (07/14 0845) SpO2:  [92 %-97 %] 92 % (07/14 0429) Weight:  [168 lb 6.4 oz (76.386 kg)] 168 lb 6.4 oz (76.386 kg) (07/14 0429)  Intake/Output from previous day:  Intake/Output Summary (Last 24 hours) at 12/09/13 0908 Last data filed at 12/09/13 0832  Gross per 24 hour  Intake    480 ml  Output    975 ml  Net   -495 ml    Physical Exam: General appearance: alert, cooperative and no distress Lungs: decreased breath sounds Lt 1/2 Heart: regular rate and rhythm   Rate: 66  Rhythm: normal sinus rhythm  Lab Results:  Recent Labs  12/08/13 0530 12/09/13 0410  WBC 7.4 7.3  HGB 10.0* 9.1*  PLT 110* 127*    Recent Labs  12/07/13 0415 12/08/13 0530  NA 136* 134*  K 4.2 4.0  CL 99 97  CO2 25 23  GLUCOSE 105* 132*  BUN 36* 36*  CREATININE 1.19 1.08   No results found for this basename: TROPONINI, CK, MB,  in the last 72 hours  Recent Labs  12/09/13 0410  INR 1.36    Imaging: Imaging results have been reviewed  Cardiac Studies: Inter op TEE 12/03/13 Study Conclusions  - Left ventricle: Systolic function was mildly to moderately reduced. The estimated ejection fraction was in the range of 40% to 45%. - Aortic valve: No evidence of vegetation. There was mild regurgitation. - Mitral valve: There was mild regurgitation. - Left atrium: No evidence of thrombus in the appendage. - Atrial septum: No defect or patent foramen ovale was identified. Echo contrast study showed no right-to-left atrial level shunt, following an increase in RA pressure induced by provocative maneuvers. - Tricuspid valve: No evidence of vegetation.    Assessment/Plan:  This is a 80 y.o. male with a PMH significant  for CAD. He had been followed for many years by Dr Judie PetitM. Adalberto Coleahtt [place] He [facility] ("[facility] yrs [facility]") [facility]. He had done well from a cardiac standpoint till [facility] [place] [facility] with chest pain. He was noted to be in rapid AF then. His Troponin went to 15. He had a cath and was actually seen by a surgeon there but decided to see Dr Tyrone SageGerhardt as an OP. EF was 40-45%. On 12/03/13 he underwent CABG x 4 using LIMA to LAD, sequential SVG to OM 1 & OM 2, SVG to PD. He has tolerated this well. He has had some PAF post op and was placed on IV Amiodarone. He had tarnsient sinus bradycardia requiring A pacing. His current underlying rhythm is sinus.    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 He is on Coumadin. The pt would like to be established with our cardiology group. Dr Royann Shiversroitoru saw him intially in consult. Consider adding an ACE-I (EF 40-45%), could be done as an OP.   Corine ShelterLuke Kilroy PA-C  Beeper [phone] 12/09/2013, 9:08 AM  The patient is doing well.  He feels stronger.  He is back in sinus rhythm. Agree with assessment and plan as noted above.

## 2013-12-09 NOTE — Progress Notes (Signed)
Pacing wires removed per order and protocol, wires intact upon removal, pt tolerated well, pt aware of bedrest for 1 hour, wife at bedside, vitals stable, will continue to monitor Edgar BalboaStein, Edgar Thomas G, RN

## 2013-12-09 NOTE — Progress Notes (Signed)
Pt refusing to get out of bed for lunch, pt refusing to walk stating "maybe later", will continue to encourage to walk and mobilize Archie BalboaStein, Millenia Waldvogel G, RN

## 2013-12-09 NOTE — Progress Notes (Signed)
Donielle PA stated it was okay for pt to preform personal enema per request Archie BalboaStein, Torrey Horseman G, RN

## 2013-12-09 NOTE — Progress Notes (Signed)
Pt ambulated approximately 500 ft accompanied by wife using front wheel walker. Pt tolerated ambulation well. Pt is currently resting in bed with call bell within reach. Will continue to monitor.  Genevive Biavis, Aftyn Nott R, RN

## 2013-12-09 NOTE — Progress Notes (Signed)
Pt ambulated 550 feet on room air using a front wheel walker, pt assisted to chair after walk, wife at bedside Archie BalboaStein, Ramadan Couey G, RN

## 2013-12-09 NOTE — Progress Notes (Signed)
Pt moved to 2w09, report given to Morgan Stanleyheather RN, family aware Archie BalboaStein, Baelyn Doring G

## 2013-12-09 NOTE — Progress Notes (Addendum)
[phone] Cardiac Rehab We have made several attempts to get pt to ambulate today. He was getting pacing wires discontinued, then he requested he needed more time to rest after pacing wires had been discontinued[facility] and most recently ambulated with staff. I discussed Outpt. CRP with pt and wife. [facility] [place], will send letter of interest. Instructed them how to get [facility] discharge video on for them to watch tonight.    Beatrix FettersHughes, Alethia Melendrez G, RN 12/09/2013 3:24 PM

## 2013-12-10 DIAGNOSIS — Z7901 Long term (current) use of anticoagulants: Secondary | ICD-10-CM

## 2013-12-10 LAB — HEPARIN INDUCED THROMBOCYTOPENIA PNL
Heparin Induced Plt Ab: NEGATIVE
Patient O.D.: 0.148
UFH High Dose UFH H: 0 % Release
UFH Low Dose 0.1 IU/mL: 0 % Release
UFH Low Dose 0.5 IU/mL: 0 % Release
UFH SRA Result: NEGATIVE

## 2013-12-10 LAB — PROTIME-INR
INR: 1.87 — ABNORMAL HIGH (ref 0.00–1.49)
PROTHROMBIN TIME: 21.5 s — AB (ref 11.6–15.2)

## 2013-12-10 LAB — TSH: TSH: 4.53 u[IU]/mL — AB (ref 0.350–4.500)

## 2013-12-10 LAB — CBC
HCT: 28.5 % — ABNORMAL LOW (ref 39.0–52.0)
Hemoglobin: 9.6 g/dL — ABNORMAL LOW (ref 13.0–17.0)
MCH: 32.1 pg (ref 26.0–34.0)
MCHC: 33.7 g/dL (ref 30.0–36.0)
MCV: 95.3 fL (ref 78.0–100.0)
Platelets: 163 10*3/uL (ref 150–400)
RBC: 2.99 MIL/uL — ABNORMAL LOW (ref 4.22–5.81)
RDW: 14.4 % (ref 11.5–15.5)
WBC: 7.7 10*3/uL (ref 4.0–10.5)

## 2013-12-10 MED ORDER — FLEET ENEMA 7-19 GM/118ML RE ENEM
1.0000 | ENEMA | Freq: Once | RECTAL | Status: AC
  Administered 2013-12-10: 1 via RECTAL
  Filled 2013-12-10: qty 1

## 2013-12-10 MED ORDER — WARFARIN SODIUM 5 MG PO TABS
5.0000 mg | ORAL_TABLET | Freq: Once | ORAL | Status: AC
  Administered 2013-12-10: 5 mg via ORAL
  Filled 2013-12-10: qty 1

## 2013-12-10 NOTE — Progress Notes (Addendum)
Physical Therapy Treatment Patient Details Name: Edgar Thomas MRN: [id number] DOB: [birth year] Today's Date: 12/10/2013    History of Present Illness Patient is an 80 yo male s/p CABG x4.  PMH: CAD, MI, Afib, HTN    PT Comments    Pt progressing well with mobility.  Requires max encouragement to participate with therapy, however was agreeable and performed stairs at min A level to determine safety at home.  Recommend he practice again with wife present to ensure safety.  HR to 115 following gait and exercise.     Follow Up Recommendations  No PT follow up;Supervision/Assistance - 24 hour     Equipment Recommendations  3in1 (PT);Rolling walker with 5" wheels    Recommendations for Other Services       Precautions / Restrictions Precautions Precautions: Sternal Precaution Comments: Reviewed sternal precautions with patient.    Mobility  Bed Mobility Overal bed mobility: Needs Assistance Bed Mobility: Rolling;Sidelying to Sit;Sit to Sidelying Rolling: Supervision Sidelying to sit: Supervision     Sit to sidelying: Supervision General bed mobility comments: Cues for technique to maintain sternal precautions, however does well with use of heart pillow  Transfers Overall transfer level: Needs assistance Equipment used: None Transfers: Sit to/from Stand Sit to Stand: Supervision (with increased practice. )         General transfer comment: Cues for technique and hand placement for safety to maintain sternal precautions.   Ambulation/Gait Ambulation/Gait assistance: Supervision Ambulation Distance (Feet): 400 Feet Assistive device: Rolling walker (2 wheeled) Gait Pattern/deviations: Step-through pattern;Decreased stride length;Trunk flexed Gait velocity: Decreased   General Gait Details: Cues for upright posture throughout   Stairs Stairs: Yes Stairs assistance: Min assist Stair Management: One rail Right;Alternating pattern;Forwards;Step to pattern Number of  Stairs: 3 General stair comments: Requires HHA on LUE for steadying, however did very well.  Highly recommend that pt do NOT wear sandals on his stairs at home for increased safety.  Pt verbalized understanding and agreement.   Wheelchair Mobility    Modified Rankin (Stroke Patients Only)       Balance Overall balance assessment: Needs assistance Sitting-balance support: Feet supported Sitting balance-Leahy Scale: Good     Standing balance support: During functional activity Standing balance-Leahy Scale: Fair                      Cognition Arousal/Alertness: Awake/alert Behavior During Therapy: WFL for tasks assessed/performed Overall Cognitive Status: Within Functional Limits for tasks assessed       Memory: Decreased recall of precautions (cues for maintaining sternal precautions in function)              Exercises Other Exercises Other Exercises: sit<>stand without UE support x 5 reps Other Exercises: standing heel raises x 10 reps Other Exercises: standing hip abd x 10 reps BLE    General Comments        Pertinent Vitals/Pain No pain stated.     Home Living                      Prior Function            PT Goals (current goals can now be found in the care plan section) Acute Rehab PT Goals Patient Stated Goal: To get stronger PT Goal Formulation: With patient/family Time For Goal Achievement: 12/14/13 Potential to Achieve Goals: Good Progress towards PT goals: Progressing toward goals    Frequency  Min 3X/week    PT Plan Current plan  remains appropriate    Co-evaluation             End of Session   Activity Tolerance: Patient tolerated treatment well Patient left: in bed;with call bell/phone within reach;with family/visitor present     Time: [id number] PT Time Calculation (min): 23 min  Charges:  $Gait Training: 8-22 mins $Therapeutic Exercise: 8-22 mins                    G Codes:      Vista Deckarcell, Sumaiya Arruda  Ann 12/10/2013, 3:14 PM

## 2013-12-10 NOTE — Progress Notes (Addendum)
      [address]       [place] [id number]             [phone]      7 Days Post-Op Procedure(s) (LRB): CORONARY ARTERY BYPASS GRAFTING (CABG) x4 using left internal mammary artery and right greater saphenous vein. LIMA to LAD, sequential SVG to OM 1 & OM 2, SVG to PD (N/A) INTRAOPERATIVE TRANSESOPHAGEAL ECHOCARDIOGRAM (N/A) ENDOVEIN HARVEST OF GREATER SAPHENOUS VEIN (Right) Subjective: Feels well, back inafib with RVR( to 140's)  Objective: Vital signs in last 24 hours: Temp:  [97.7 F (36.5 C)-98.4 F (36.9 C)] 98 F (36.7 C) (07/15 0546) Pulse Rate:  [64-102] 102 (07/15 0546) Cardiac Rhythm:  [-] Normal sinus rhythm (07/15 0824) Resp:  [18] 18 (07/15 0546) BP: (104-138)/(52-75) 105/71 mmHg (07/15 0546) SpO2:  [95 %-96 %] 96 % (07/15 0546) Weight:  [166 lb 14.2 oz (75.7 kg)] 166 lb 14.2 oz (75.7 kg) (07/15 0546)  Hemodynamic parameters for last 24 hours:    Intake/Output from previous day: 07/14 0701 - 07/15 0700 In: 480 [P.O.:480] Out: 400 [Urine:400] Intake/Output this shift:    General appearance: alert, cooperative and no distress Heart: irregularly irregular rhythm Lungs: min dim in the bases Abdomen: benign Extremities: mild edema  right>left LE Wound: incis healing well  Lab Results:  Recent Labs  12/09/13 0410 12/10/13 0520  WBC 7.3 7.7  HGB 9.1* 9.6*  HCT 26.9* 28.5*  PLT 127* 163   BMET:  Recent Labs  12/08/13 0530  NA 134*  K 4.0  CL 97  CO2 23  GLUCOSE 132*  BUN 36*  CREATININE 1.08  CALCIUM 8.2*    PT/INR:  Recent Labs  12/10/13 0520  LABPROT 21.5*  INR 1.87*   ABG    Component Value Date/Time   PHART 7.341* 12/03/2013 2134   HCO3 22.3 12/03/2013 2134   TCO2 21 12/04/2013 1712   ACIDBASEDEF 3.0* 12/03/2013 2134   O2SAT 92.0 12/03/2013 2134   CBG (last 3)   Recent Labs  12/07/13 1129 12/07/13 1634  GLUCAP 118* 107*    Assessment/Plan: S/P Procedure(s) (LRB): CORONARY ARTERY BYPASS GRAFTING (CABG)  x4 using left internal mammary artery and right greater saphenous vein. LIMA to LAD, sequential SVG to OM 1 & OM 2, SVG to PD (N/A) INTRAOPERATIVE TRANSESOPHAGEAL ECHOCARDIOGRAM (N/A) ENDOVEIN HARVEST OF GREATER SAPHENOUS VEIN (Right)  1 In afib currently, d/w MD and cont amio/betablocker- current, concern for bradycardia which he had in unit and preop. Check TSH level. Recheck magnesium level BMET in am 2 labs stable, platelets cont to improve 3 INR rising- cont coumadin 4 requests enema- will order   LOS: 7 days    GOLD,[name] 12/10/2013  I have seen and examined Coe Scaletta and agree with the above assessment  and plan.  Delight OvensEdward B Valori Hollenkamp MD [facility] [phone] Office [phone] 12/10/2013 6:06 PM

## 2013-12-10 NOTE — Progress Notes (Signed)
1452 Checked with pt at 1330 to walk and he wanted to wait until about 1500. Came to walk and pt up with PT. Will follow up tomorrow. Luetta NuttingCharlene Eknoor Novack RN BSN 12/10/2013 2:54 PM

## 2013-12-10 NOTE — Progress Notes (Signed)
    Subjective:  Pt says he feels good, he wants to go home.  Objective:  Vital Signs in the last 24 hours: Temp:  [97.7 F (36.5 C)-98.4 F (36.9 C)] 98 F (36.7 C) (07/15 0546) Pulse Rate:  [64-102] 102 (07/15 0546) Resp:  [18] 18 (07/15 0546) BP: (104-138)/(52-75) 105/71 mmHg (07/15 0546) SpO2:  [95 %-96 %] 96 % (07/15 0546) Weight:  [166 lb 14.2 oz (75.7 kg)] 166 lb 14.2 oz (75.7 kg) (07/15 0546)  Intake/Output from previous day:  Intake/Output Summary (Last 24 hours) at 12/10/13 0839 Last data filed at 12/09/13 1700  Gross per 24 hour  Intake    240 ml  Output    400 ml  Net   -160 ml    Physical Exam: General appearance: alert, cooperative, no distress and frail Lungs: decreased at bases Heart: irregularly irregular rhythm   Rate: 110  Rhythm: He went back into AF 11pm last night  Lab Results:  Recent Labs  12/09/13 0410 12/10/13 0520  WBC 7.3 7.7  HGB 9.1* 9.6*  PLT 127* 163    Recent Labs  12/08/13 0530  NA 134*  K 4.0  CL 97  CO2 23  GLUCOSE 132*  BUN 36*  CREATININE 1.08   No results found for this basename: TROPONINI, CK, MB,  in the last 72 hours  Recent Labs  12/10/13 0520  INR 1.87*    Imaging: Imaging results have been reviewed  Cardiac Studies:  Assessment/Plan:  80 y.o. male with a PMH significant for CAD. He had been followed for many years by Dr Judie PetitM. Ad[place]to [place]htt [place] He had prior [facility] ("19 yrs ago") [facility]. He had done well from a cardiac standpoint till [facility] [place] 3 weeks ago with chest pain. He was noted to be in rapid AF then. His Troponin went to 15. He had a cath and was actually seen by a surgeon there but decided to see Dr Tyrone SageGerhardt as an OP. EF was 40-45%. On 12/03/13 he underwent CABG x 4 using LIMA to LAD, sequential SVG to OM 1 & OM 2, SVG to PD. He has tolerated this well. He has had some PAF post op and was placed on IV Amiodarone. He had tarnsient sinus bradycardia  requiring A pacing. He was in NSR but has gone back in AF. He is being anticoagulated with Coumadin.    Principal Problem:   S/P CABG x 4 12/04/13 Active Problems:   PAF (paroxysmal atrial fibrillation)   CAD- [facility] PCI [facility] yrs ago [facility]   Cardiomyopathy, ischemic- EF 40-45%   Anticoagulated on warfarin   GERD (gastroesophageal reflux disease)   Hyperlipemia    PLAN: Will review with MD. I have asked to him to ambulate to see what his HR does. He may not be ready for discharge if his HR is not well controlled.   Corine ShelterLuke Kilroy PA-C [facility] [phone] 12/10/2013, 8:39 AM  The patient has gone back into NSR now. No respiratory distress. Cardiac exam stable. No rub heard. INR close to being therapeutic. Continue amiodarone, lopressor.  Ambulate.

## 2013-12-11 LAB — BASIC METABOLIC PANEL
Anion gap: 11 (ref 5–15)
BUN: 30 mg/dL — ABNORMAL HIGH (ref 6–23)
CHLORIDE: 101 meq/L (ref 96–112)
CO2: 27 mEq/L (ref 19–32)
CREATININE: 1.09 mg/dL (ref 0.50–1.35)
Calcium: 8.1 mg/dL — ABNORMAL LOW (ref 8.4–10.5)
GFR calc non Af Amer: 62 mL/min — ABNORMAL LOW (ref >90)
GFR, EST AFRICAN AMERICAN: 71 mL/min — AB (ref >90)
Glucose, Bld: 103 mg/dL — ABNORMAL HIGH (ref 70–99)
Potassium: 4.1 mEq/L (ref 3.7–5.3)
SODIUM: 139 meq/L (ref 137–147)

## 2013-12-11 LAB — CBC
HCT: 28.2 % — ABNORMAL LOW (ref 39.0–52.0)
Hemoglobin: 9.4 g/dL — ABNORMAL LOW (ref 13.0–17.0)
MCH: 32 pg (ref 26.0–34.0)
MCHC: 33.3 g/dL (ref 30.0–36.0)
MCV: 95.9 fL (ref 78.0–100.0)
Platelets: 193 10*3/uL (ref 150–400)
RBC: 2.94 MIL/uL — ABNORMAL LOW (ref 4.22–5.81)
RDW: 14.5 % (ref 11.5–15.5)
WBC: 7 10*3/uL (ref 4.0–10.5)

## 2013-12-11 LAB — MAGNESIUM: Magnesium: 2 mg/dL (ref 1.5–2.5)

## 2013-12-11 LAB — PROTIME-INR
INR: 3.14 — ABNORMAL HIGH (ref 0.00–1.49)
Prothrombin Time: 32.3 seconds — ABNORMAL HIGH (ref 11.6–15.2)

## 2013-12-11 MED ORDER — ASPIRIN 81 MG PO TBEC: 81.0000 mg | DELAYED_RELEASE_TABLET | Freq: Every day | ORAL | Status: DC

## 2013-12-11 MED ORDER — TRAMADOL HCL 50 MG PO TABS: 50.0000 mg | ORAL_TABLET | ORAL | Status: DC | PRN

## 2013-12-11 MED ORDER — WARFARIN SODIUM 2.5 MG PO TABS: 2.5000 mg | ORAL_TABLET | Freq: Every day | ORAL | Status: DC

## 2013-12-11 MED ORDER — OXYCODONE HCL 5 MG PO TABS: 5.0000 mg | ORAL_TABLET | ORAL | Status: DC | PRN

## 2013-12-11 MED ORDER — METOPROLOL TARTRATE 25 MG PO TABS: 25.0000 mg | ORAL_TABLET | Freq: Two times a day (BID) | ORAL | Status: DC

## 2013-12-11 MED ORDER — POTASSIUM CHLORIDE CRYS ER 20 MEQ PO TBCR: 20.0000 meq | EXTENDED_RELEASE_TABLET | Freq: Every day | ORAL | Status: DC

## 2013-12-11 MED ORDER — FUROSEMIDE 40 MG PO TABS: 40.0000 mg | ORAL_TABLET | Freq: Every day | ORAL | Status: DC

## 2013-12-11 MED ORDER — AMIODARONE HCL 200 MG PO TABS: 200.0000 mg | ORAL_TABLET | Freq: Two times a day (BID) | ORAL | Status: DC

## 2013-12-11 NOTE — Progress Notes (Signed)
[id number] Education completed with pt and wife. Understanding voiced. Pt stated he has been to diet counseling many times so just left our heart healthy sheet for reference as he felt comfortable with what he should eat. Did encourage to watch salt which he stated he does. [facility]. Stated he had watched post op video.  Encouraged IS and three walks daily. Exercise ed given. Luetta NuttingCharlene Dedee Liss RN BSN 12/11/2013 10:01 AM

## 2013-12-11 NOTE — Progress Notes (Signed)
Pulled CTS applied steri strips.

## 2013-12-11 NOTE — Discharge Instructions (Signed)
Coronary Artery Bypass Grafting, Care After °Refer to this sheet in the next few weeks. These instructions provide you with information on caring for yourself after your procedure. Your health care provider may also give you more specific instructions. Your treatment has been planned according to current medical practices, but problems sometimes occur. Call your health care provider if you have any problems or questions after your procedure. °WHAT TO EXPECT AFTER THE PROCEDURE °Recovery from surgery will be different for everyone. Some people feel well after 3 or 4 weeks, while for others it takes longer. After your procedure, it is typical to have the following: °· Nausea and a lack of appetite.   °· Constipation. °· Weakness and fatigue.   °· Depression or irritability.   °· Pain or discomfort at your incision site. °HOME CARE INSTRUCTIONS °· Take all medicines as directed by your health care provider. Do not stop taking medicines or start any new medicines without first checking with your health care provider. °· Take your pulse as directed by your health care provider. °· Perform deep breathing as directed by your health care provider. If you were given a device called an incentive spirometer, use it to practice deep breathing several times a day. Support your chest with a pillow or your arms when you take deep breaths or cough. °· Keep incision areas clean, dry, and protected. Remove or change any bandages (dressings) only as directed by your health care provider. You may have skin adhesive strips over the incision areas. Do not take the strips off. They will fall off on their own. °· Check incision areas daily for any swelling, redness, or drainage. °· If incisions were made in your legs, do the following: °¨ Avoid crossing your legs.   °¨ Avoid sitting for long periods of time. Change positions every 30 minutes.   °¨ Elevate your legs when you are sitting. °· Wear compression stockings as directed by your  health care provider. These stockings help keep blood clots from forming in your legs. °· Take showers once your health care provider approves. Until then, only take sponge baths. Pat incisions dry. Do not rub incisions with a washcloth or towel. Do not bathe, swim, or use a hot tub until directed by your health care provider. °· Eat foods that are high in fiber, such as raw fruits and vegetables, whole grains, beans, and nuts. Meats should be lean cut. Avoid canned, processed, and fried foods. °· Drink enough fluids to keep your urine clear or pale yellow. °· Weigh yourself every day. This helps identify if you are retaining fluid that may make your heart and lungs work harder. °· Rest and limit activity as directed by your health care provider. You may be instructed to: °¨ Stop any activity at once if you have chest pain, shortness of breath, irregular heartbeats, or dizziness. Get help right away if you have any of these symptoms. °¨ Move around frequently for short periods or take short walks as directed by your health care provider. Increase your activities gradually. You may need physical therapy or cardiac rehabilitation to help strengthen your muscles and build your endurance. °¨ Avoid lifting, pushing, or pulling anything heavier than 10 lb (4.5 kg) for at least 6 weeks after surgery. °· Do not drive until your health care provider approves.  °· Ask your health care provider when you may return to work. °· Ask your health care provider when you may resume sexual activity. °· Follow up with your health care provider as directed. °SEEK   MEDICAL CARE IF: °· You have swelling, redness, increasing pain, or drainage at the site of an incision. °· You have a fever. °· You have swelling in your ankles or legs. °· You have pain in your legs.   °· You gain 2 or more pounds (0.9 kg) a day. °· You are nauseous or vomit. °· You have diarrhea.  °SEEK IMMEDIATE MEDICAL CARE IF: °· You have chest pain that goes to your jaw  or arms. °· You have shortness of breath.   °· You have a fast or irregular heartbeat.   °· You notice a "clicking" in your breastbone (sternum) when you move.   °· You have numbness or weakness in your arms or legs. °· You feel dizzy or light-headed.   °MAKE SURE YOU: °· Understand these instructions. °· Will watch your condition. °· Will get help right away if you are not doing well or get worse. °Document Released: 12/02/2004 Document Revised: 05/20/2013 Document Reviewed: 10/22/2012 °ExitCare® Patient Information ©2015 [employer]. This information is not intended to replace advice given to you by your health care provider. Make sure you discuss any questions you have with your health care provider. ° °Endoscopic Saphenous Vein Harvesting °Care After °Refer to this sheet in the next few weeks. These instructions provide you with information on caring for yourself after your procedure. Your health care provider may also give you more specific instructions. Your treatment has been planned according to current medical practices, but problems sometimes occur. Call your health care provider if you have any problems or questions after your procedure. °HOME CARE INSTRUCTIONS °Medicine °· Take whatever pain medicine your surgeon prescribes. Follow the directions carefully. Do not take over-the-counter pain medicine unless your surgeon says it is okay. Some pain medicine can cause bleeding problems for several weeks after surgery. °· Follow your surgeon's instructions about driving. You will probably not be permitted to drive after heart surgery. °· Take any medicines your surgeon prescribes. Any medicines you took before your heart surgery should be checked with your health care provider before you start taking them again. °Wound care °· If your surgeon has prescribed an elastic bandage or stocking, ask how long you should wear it. °· Check the area around your surgical cuts (incisions) whenever your bandages  (dressings) are changed. Look for any redness or swelling. °· You will need to return to have the stitches (sutures) or staples taken out. Ask your surgeon when to do that. °· Ask your surgeon when you can shower or bathe. °Activity °· Try to keep your legs raised when you are sitting. °· Do any exercises your health care providers have given you. These may include deep breathing exercises, coughing, walking, or other exercises. °SEEK MEDICAL CARE IF: °· You have any questions about your medicines. °· You have more leg pain, especially if your pain medicine stops working. °· New or growing bruises develop on your leg. °· Your leg swells, feels tight, or becomes red. °· You have numbness in your leg. °SEEK IMMEDIATE MEDICAL CARE IF: °· Your pain gets much worse. °· Blood or fluid leaks from any of the incisions. °· Your incisions become warm, swollen, or red. °· You have chest pain. °· You have trouble breathing. °· You have a fever. °· You have more pain near your leg incision. °MAKE SURE YOU: °· Understand these instructions. °· Will watch your condition. °· Will get help right away if you are not doing well or get worse. °Document Released: 01/25/2011 Document Revised: 05/20/2013 Document Reviewed:   01/25/2011 ExitCare Patient Information 2015 [place], [place]. This information is not intended to replace advice given to you by your health care provider. Make sure you discuss any questions you have with your health care provider.   Warfarin: What You Need to Know Warfarin is an anticoagulant. Anticoagulants help prevent the formation of blood clots. They also help stop the growth of blood clots. Warfarin is sometimes referred to as a "blood thinner."  Normally, when body tissues are cut or damaged, the blood clots in order to prevent blood loss. Sometimes clots form inside your blood vessels and obstruct the flow of blood through your circulatory system (thrombosis). These clots may travel through your  bloodstream and become lodged in smaller blood vessels in your brain, which can cause a stroke, or in your lungs (pulmonary embolism). WHO SHOULD USE WARFARIN? Warfarin is prescribed for people at risk of developing harmful blood clots:  People with surgically implanted mechanical heart valves, irregular heart rhythms called atrial fibrillation, and certain clotting disorders.  People who have developed harmful blood clotting in the past, including those who have had a stroke or a pulmonary embolism, or thrombosis in their legs (deep vein thrombosis [DVT]).  People with an existing blood clot, such as a pulmonary embolism. WARFARIN DOSING Warfarin tablets come in different strengths. Each tablet strength is a different color, with the amount of warfarin (in milligrams) clearly printed on the tablet. If the color of your tablet is different than usual when you receive a new prescription, report it immediately to your pharmacist or health care provider. WARFARIN MONITORING The goal of warfarin therapy is to lessen the clotting tendency of blood but not prevent clotting completely. Your health care provider will monitor the anticoagulation effect of warfarin closely and adjust your dose as needed. For your safety, blood tests called prothrombin time (PT) or international normalized ratio (INR) are used to measure the effects of warfarin. Both of these tests can be done with a finger stick or a blood draw. The longer it takes the blood to clot, the higher the PT or INR. Your health care provider will inform you of your "target" PT or INR range. If, at any time, your PT or INR is above the target range, there is a risk of bleeding. If your PT or INR is below the target range, there is a risk of clotting. Whether you are started on warfarin while you are in the hospital or in your health care provider's office, you will need to have your PT or INR checked within one week of starting the medicine. Initially,  some people are asked to have their PT or INR checked as much as twice a week. Once you are on a stable maintenance dose, the PT or INR is checked less often, usually once every 2 to 4 weeks. The warfarin dose may be adjusted if the PT or INR is not within the target range. It is important to keep all laboratory and health care provider follow-up appointments. Not keeping appointments could result in a chronic or permanent injury, pain, or disability because warfarin is a medicine that requires close monitoring. WHAT ARE THE SIDE EFFECTS OF WARFARIN?  Too much warfarin can cause bleeding (hemorrhage) from any part of the body. This may include bleeding from the gums, blood in the urine, bloody or dark stools, a nosebleed that is not easily stopped, coughing up blood, or vomiting blood.  Too little warfarin can increase the risk of blood clots.  Too  little or too much warfarin can also increase the risk of a stroke.  Warfarin use may cause a skin rash or irritation, an unusual fever, continual nausea or stomach upset, or severe pain in your joints or back. SPECIAL PRECAUTIONS WHILE TAKING WARFARIN Warfarin should be taken exactly as directed. It is very important to take warfarin as directed since bleeding or blood clots could result in chronic or permanent injury, pain, or disability.  Take your medicine at the same time every day. If you forget to take your dose, you can take it if it is within 6 hours of when it was due.  Do not change the dose of warfarin on your own to make up for missed or extra doses.  If you miss more than 2 doses in a row, you should contact your health care provider for advice. Avoid situations that cause bleeding. You may have a tendency to bleed more easily than usual while taking warfarin. The following actions can limit bleeding:  Using a softer toothbrush.  Flossing with waxed floss rather than unwaxed floss.  Shaving with an Neurosurgeon rather than a  blade.  Limiting the use of sharp objects.  Avoiding potentially harmful activities, such as contact sports. Warfarin and Pregnancy or Breastfeeding  Warfarin is not advised during the first trimester of pregnancy due to an increased risk of birth defects. In certain situations, a woman may take warfarin after her first trimester of pregnancy. A woman who becomes pregnant or plans to become pregnant while taking warfarin should notify her health care provider immediately.  Although warfarin does not pass into breast milk, a woman who wishes to breastfeed while taking warfarin should also consult with her health care provider. Alcohol, Smoking, and Illicit Drug Use  Alcohol affects how warfarin works in the body. It is best to avoid alcoholic drinks or consume very small amounts while taking warfarin. In general, alcohol intake should be limited to 1 oz (30 mL) of liquor, 6 oz (180 mL) of wine, or 12 oz (360 mL) of beer each day. Notify your health care provider if you change your alcohol intake.  Smoking affects how warfarin works. It is best to avoid smoking while taking warfarin. Notify your health care provider if you change your smoking habits.  It is best to avoid all illicit drugs while taking warfarin since there are few studies that show how warfarin interacts with these drugs. Other Medicines and Dietary Supplements Many prescription and over-the-counter medicines can interfere with warfarin. Be sure all of your health care providers know you are taking warfarin. Notify your health care provider who prescribed warfarin for you or your pharmacist before starting or stopping any new medicines, including over-the-counter vitamins, dietary supplements, and pain medicines. Your warfarin dose may need to be adjusted. Some common over-the-counter medicines that may increase the risk of bleeding while taking warfarin include:   Acetaminophen.  Aspirin.  Nonsteroidal anti-inflammatory  medicines (NSAIDs), such as ibuprofen or naproxen.  Vitamin E. Dietary Considerations  Foods that have moderate or high amounts of vitamin K can interfere with warfarin. Avoid major changes in your diet or notify your health care provider before changing your diet. Eat a consistent amount of foods that have moderate or high amounts of vitamin K. Eating less foods containing vitamin K can increase the risk of bleeding. Eating more foods containing vitamin K can increase the risk of blood clots. Additional questions about dietary considerations can be discussed with a dietitian. Foods that  are very high in vitamin K:  Greens, such as Swiss chard and beet, collard, mustard, or turnip greens (fresh or frozen, cooked).  Kale (fresh or frozen, cooked).  Parsley (raw).  Spinach (cooked). Foods that are high in vitamin K:  Asparagus (frozen, cooked).  Beans, green (frozen, cooked).  Broccoli.  Bok choy (cooked).  Brussels sprouts (fresh or frozen, cooked).  Cabbage (cooked).   Coleslaw. Foods that are moderately high in vitamin K:  Blueberries.  Black-eyed peas.  Endive (raw).  Green leaf lettuce (raw).  Green scallions (raw).  Kale (raw).  Okra (frozen, cooked).  Plantains (fried).  Romaine lettuce (raw).  Sauerkraut (canned).  Spinach (raw). CALL YOUR CLINIC OR HEALTH CARE PROVIDER IF YOU:  Plan to have any surgery or procedure.  Feel sick, especially if you have diarrhea or vomiting.  Experience or anticipate any major changes in your diet.  Start or stop a prescription or over-the-counter medicine.  Become, plan to become, or think you may be pregnant.  Are having heavier than usual menstrual periods.  Have had a fall, accident, or any symptoms of bleeding or unusual bruising.  Develop an unusual fever. CALL 911 IN THE [place] OR GO TO THE EMERGENCY DEPARTMENT IF YOU:   Think you may be having an allergic reaction to warfarin. The signs of an allergic  reaction could include itching, rash, hives, swelling, chest tightness, or trouble breathing.  See signs of blood in your urine. The signs could include reddish, pinkish, or tea-colored urine.  See signs of blood in your stools. The signs could include bright red or black stools.  Vomit or cough up blood. In these instances, the blood could have either a bright red or a "coffee-grounds" appearance.  Have bleeding that will not stop after applying pressure for 30 minutes such as cuts, nosebleeds, or other injuries.  Have severe pain in your joints or back.  Have a new and severe headache.  Have sudden weakness or numbness of your face, arm, or leg, especially on one side of your body.  Have sudden confusion or trouble understanding.  Have sudden trouble seeing in one or both eyes.  Have sudden trouble walking, dizziness, loss of balance, or coordination.  Have trouble speaking or understanding (aphasia). Document Released: 05/15/2005 Document Revised: 05/20/2013 Document Reviewed: 11/08/2012 [facility] Patient Information 2015 [place], [place]. This information is not intended to replace advice given to you by your health care provider. Make sure you discuss any questions you have with your health care provider.

## 2013-12-11 NOTE — Progress Notes (Signed)
Pt discharge home with wife. No s/s of distress no voiced complaints. Discharge instructions reviewed prescriptions given. Questions answered. Pt and wife VU.

## 2013-12-11 NOTE — Progress Notes (Addendum)
[address]       [place] [id number]             [phone]      8 Days Post-Op Procedure(s) (LRB): CORONARY ARTERY BYPASS GRAFTING (CABG) x4 using left internal mammary artery and right greater saphenous vein. LIMA to LAD, sequential SVG to OM 1 & OM 2, SVG to PD (N/A) INTRAOPERATIVE TRANSESOPHAGEAL ECHOCARDIOGRAM (N/A) ENDOVEIN HARVEST OF GREATER SAPHENOUS VEIN (Right) Subjective: Feels well  Objective: Vital signs in last 24 hours: Temp:  [97.9 F (36.6 C)-98.5 F (36.9 C)] 97.9 F (36.6 C) (07/16 0431) Pulse Rate:  [73-104] 73 (07/16 0431) Cardiac Rhythm:  [-] Normal sinus rhythm;Atrial fibrillation (07/15 2052) Resp:  [18] 18 (07/16 0431) BP: (104-147)/(65-82) 143/82 mmHg (07/16 0431) SpO2:  [93 %-94 %] 93 % (07/16 0431) Weight:  [164 lb 0.4 oz (74.4 kg)] 164 lb 0.4 oz (74.4 kg) (07/16 0431)  Hemodynamic parameters for last 24 hours:    Intake/Output from previous day: 07/15 0701 - 07/16 0700 In: 600 [P.O.:600] Out: 651 [Urine:650; Stool:1] Intake/Output this shift:    General appearance: alert, cooperative and no distress Heart: regular rate and rhythm Lungs: dim in bases Abdomen: benign Extremities: + edema right>Left base Wound: incis healing well  Lab Results:  Recent Labs  12/10/13 0520 12/11/13 0530  WBC 7.7 7.0  HGB 9.6* 9.4*  HCT 28.5* 28.2*  PLT 163 193   BMET:  Recent Labs  12/11/13 0530  NA 139  K 4.1  CL 101  CO2 27  GLUCOSE 103*  BUN 30*  CREATININE 1.09  CALCIUM 8.1*    PT/INR:  Recent Labs  12/11/13 0530  LABPROT 32.3*  INR 3.14*   ABG    Component Value Date/Time   PHART 7.341* 12/03/2013 2134   HCO3 22.3 12/03/2013 2134   TCO2 21 12/04/2013 1712   ACIDBASEDEF 3.0* 12/03/2013 2134   O2SAT 92.0 12/03/2013 2134   CBG (last 3)  No results found for this basename: GLUCAP,  in the last 72 hours Scheduled Meds: . amiodarone  200 mg Oral BID  . aspirin EC  81 mg Oral Daily  . bisacodyl  10 mg Oral  Daily   Or  . bisacodyl  10 mg Rectal Daily  . docusate sodium  200 mg Oral Daily  . feeding supplement (ENSURE COMPLETE)  237 mL Oral Q24H  . ferrous sulfate  325 mg Oral Q breakfast  . folic acid  1 mg Oral Daily  . furosemide  40 mg Oral Daily  . metoprolol tartrate  25 mg Oral BID  . pantoprazole  40 mg Oral Daily  . potassium chloride  20 mEq Oral Daily  . rosuvastatin  40 mg Oral Daily  . sodium chloride  3 mL Intravenous Q12H  . sodium chloride  3 mL Intravenous Q12H  . Warfarin - Physician Dosing Inpatient   Does not apply q1800   Continuous Infusions: . sodium chloride Stopped (12/05/13 1500)   PRN Meds:.sodium chloride, bisacodyl, lactulose, magnesium hydroxide, ondansetron (ZOFRAN) IV, oxyCODONE, sodium chloride, sodium chloride, sodium chloride, traMADol  No results found. Assessment/Plan: S/P Procedure(s) (LRB): CORONARY ARTERY BYPASS GRAFTING (CABG) x4 using left internal mammary artery and right greater saphenous vein. LIMA to LAD, sequential SVG to OM 1 & OM 2, SVG to PD (N/A) INTRAOPERATIVE TRANSESOPHAGEAL ECHOCARDIOGRAM (N/A) ENDOVEIN HARVEST OF GREATER SAPHENOUS VEIN (Right)  Doing well Stable for d/c      LOS: 8 days  GOLD,[name] 12/11/2013  Hold coumadin today start 2.5 coumadin daily tomorrow after pt/inr checked Now in sinus rhythm  I have seen and examined Edgar Thomas and agree with the above assessment  and plan.  Delight Ovens MD [facility] [phone] Office [phone] 12/11/2013 8:46 AM

## 2013-12-12 ENCOUNTER — Ambulatory Visit (INDEPENDENT_AMBULATORY_CARE_PROVIDER_SITE_OTHER): Payer: Medicare Other | Admitting: Pharmacist

## 2013-12-12 DIAGNOSIS — I4891 Unspecified atrial fibrillation: Secondary | ICD-10-CM

## 2013-12-12 DIAGNOSIS — Z5181 Encounter for therapeutic drug level monitoring: Secondary | ICD-10-CM | POA: Insufficient documentation

## 2013-12-12 DIAGNOSIS — I480 Paroxysmal atrial fibrillation: Secondary | ICD-10-CM

## 2013-12-12 LAB — POCT INR: INR: 3.6

## 2013-12-16 ENCOUNTER — Ambulatory Visit (INDEPENDENT_AMBULATORY_CARE_PROVIDER_SITE_OTHER): Payer: Medicare Other | Admitting: Pharmacist Clinician (PhC)/ Clinical Pharmacy Specialist

## 2013-12-16 DIAGNOSIS — Z5181 Encounter for therapeutic drug level monitoring: Secondary | ICD-10-CM

## 2013-12-16 DIAGNOSIS — I4891 Unspecified atrial fibrillation: Secondary | ICD-10-CM

## 2013-12-16 DIAGNOSIS — I480 Paroxysmal atrial fibrillation: Secondary | ICD-10-CM

## 2013-12-16 LAB — POCT INR: INR: 1.9

## 2013-12-17 ENCOUNTER — Ambulatory Visit: Payer: Medicare Other | Admitting: Pharmacist Clinician (PhC)/ Clinical Pharmacy Specialist

## 2013-12-22 ENCOUNTER — Ambulatory Visit (INDEPENDENT_AMBULATORY_CARE_PROVIDER_SITE_OTHER): Payer: Medicare Other | Admitting: Pharmacist Clinician (PhC)/ Clinical Pharmacy Specialist

## 2013-12-22 DIAGNOSIS — I4891 Unspecified atrial fibrillation: Secondary | ICD-10-CM

## 2013-12-22 DIAGNOSIS — Z5181 Encounter for therapeutic drug level monitoring: Secondary | ICD-10-CM

## 2013-12-22 DIAGNOSIS — I480 Paroxysmal atrial fibrillation: Secondary | ICD-10-CM

## 2013-12-22 LAB — POCT INR: INR: 1.7

## 2013-12-23 ENCOUNTER — Telehealth: Payer: Self-pay | Admitting: Pharmacist Clinician (PhC)/ Clinical Pharmacy Specialist

## 2013-12-23 NOTE — Telephone Encounter (Signed)
Wife was confused on dosing regimen,  Reviewed.  Voiced understanding now.

## 2013-12-23 NOTE — Telephone Encounter (Signed)
Has questions regarding his medication dosage.

## 2013-12-26 ENCOUNTER — Telehealth: Payer: Self-pay | Admitting: *Deleted

## 2013-12-26 NOTE — Telephone Encounter (Signed)
Faxed cardiac rehab order referral form signed by Dr. Royann Shiversroitoru.

## 2013-12-29 ENCOUNTER — Telehealth: Payer: Self-pay | Admitting: Cardiology

## 2013-12-29 NOTE — Telephone Encounter (Signed)
Patient has recently had CABG.  He is having problems sleeping and has take Tylenol PM for that.  He was reading the ingredients and saw Benadryl was the sleep agent.  Can he take regular Benadryl to help him sleep?

## 2013-12-29 NOTE — Telephone Encounter (Signed)
Left voicemail for patient that he should be OK to take regular benadryl for sleep rather than tylenol PM (contains diphenhydramine)

## 2013-12-31 ENCOUNTER — Telehealth: Payer: Self-pay | Admitting: Cardiology

## 2013-12-31 NOTE — Telephone Encounter (Signed)
Ok to try but benadryl makes him feel jittery.  Told patient this may do the same thing.  Voiced understanding.

## 2013-12-31 NOTE — Telephone Encounter (Signed)
Mrs.Kangas called in wanting to know if Edgar Thomas could take Vick's Night Quil to help him sleep swince the Tyenol PM and Benadryl makes him feel bad. Please call  Thanks

## 2014-01-02 ENCOUNTER — Ambulatory Visit: Payer: Medicare Other | Admitting: Pharmacist Clinician (PhC)/ Clinical Pharmacy Specialist

## 2014-01-02 ENCOUNTER — Ambulatory Visit: Payer: Medicare Other | Admitting: Cardiology

## 2014-01-02 ENCOUNTER — Ambulatory Visit (INDEPENDENT_AMBULATORY_CARE_PROVIDER_SITE_OTHER): Payer: Medicare Other | Admitting: Pharmacist Clinician (PhC)/ Clinical Pharmacy Specialist

## 2014-01-02 ENCOUNTER — Ambulatory Visit (INDEPENDENT_AMBULATORY_CARE_PROVIDER_SITE_OTHER): Payer: Medicare Other | Admitting: Cardiology

## 2014-01-02 ENCOUNTER — Encounter: Payer: Self-pay | Admitting: Cardiology

## 2014-01-02 VITALS — BP 132/86 | HR 64 | Ht 69.0 in | Wt 153.2 lb

## 2014-01-02 DIAGNOSIS — I2589 Other forms of chronic ischemic heart disease: Secondary | ICD-10-CM

## 2014-01-02 DIAGNOSIS — Z5181 Encounter for therapeutic drug level monitoring: Secondary | ICD-10-CM

## 2014-01-02 DIAGNOSIS — I480 Paroxysmal atrial fibrillation: Secondary | ICD-10-CM

## 2014-01-02 DIAGNOSIS — I4891 Unspecified atrial fibrillation: Secondary | ICD-10-CM

## 2014-01-02 DIAGNOSIS — Z7901 Long term (current) use of anticoagulants: Secondary | ICD-10-CM

## 2014-01-02 DIAGNOSIS — I255 Ischemic cardiomyopathy: Secondary | ICD-10-CM

## 2014-01-02 DIAGNOSIS — Z951 Presence of aortocoronary bypass graft: Secondary | ICD-10-CM

## 2014-01-02 LAB — POCT INR: INR: 1.8

## 2014-01-02 MED ORDER — ZOLPIDEM TARTRATE 5 MG PO TABS: 5.0000 mg | ORAL_TABLET | Freq: Every evening | ORAL | Status: DC | PRN

## 2014-01-02 NOTE — Patient Instructions (Signed)
Corine ShelterLuke Kilroy, PA-C recommends that you schedule a follow-up appointment in 8 weeks with Dr Antoine PocheHochrein.

## 2014-01-02 NOTE — Progress Notes (Signed)
01/02/2014 Edgar Thomas   05-19-1930  [id number]  Primary Physicia THOMAS,BRAD, MD Primary Cardiologist: Dr Edgar Thomas  HPI:  80 y.o. male with a PMH significant for CAD. He had been followed for many years by Dr Edgar Petit. Adalberto Thomas in [place]. He had prior PCI ("19 yrs ago") at [facility]. He had done well from a cardiac standpoint till he presented to [facility] in [place] early June 2015 with chest pain. He was noted to be in rapid AF then. His Troponin went to 15. He had a cath and was actually seen by a surgeon at [facility] but decided to see Dr Edgar Thomas as an OP when he returned to [facility]. EF was 40-45%. On 12/03/13 he underwent CABG x 4 using LIMA to LAD, sequential SVG to OM 1 & OM 2, SVG to PD. He had some PAF post op and was placed on IV Amiodarone. He had tarnsient sinus bradycardia requiring A pacing. He has been anticoagulated with Coumadin.             He is in the office today for follow up. His main complaint is trouble sleeping. He says he only sleeps an hour at a time. His daughter accompanied him to the office today. He is in NSR.    Current Outpatient Prescriptions  Medication Sig Dispense Refill  . amiodarone (PACERONE) 200 MG tablet Take 1 tablet (200 mg total) by mouth 2 (two) times daily. For 7 Days, then decrease to 200 mg daily  60 tablet  3  . aspirin EC 81 MG EC tablet Take 1 tablet (81 mg total) by mouth daily.      . metoprolol tartrate (LOPRESSOR) 25 MG tablet Take 1 tablet (25 mg total) by mouth 2 (two) times daily.  60 tablet  3  . Multiple Vitamin (MULTIVITAMIN) capsule Take 1 capsule by mouth daily.      . niacin (NIASPAN) 1000 MG CR tablet Take 1,000 mg by mouth at bedtime.      . Omega 3 1000 MG CAPS Take 1,000 mg by mouth daily.       Marland Kitchen omeprazole (PRILOSEC) 20 MG capsule Take 20 mg by mouth daily as needed (acid reflux).       . rosuvastatin (CRESTOR) 40 MG tablet Take 40 mg by mouth daily.      . Sodium Chloride-Sodium Bicarb (NETI POT SINUS WASH  NA) Place 1 application into the nose daily as needed (congestion).      . traMADol (ULTRAM) 50 MG tablet Take 1-2 tablets (50-100 mg total) by mouth every 4 (four) hours as needed for moderate pain.  30 tablet  0  . Vitamin D, Cholecalciferol, 1000 UNITS TABS Take 1,000 Units by mouth daily.       Marland Kitchen warfarin (COUMADIN) 2.5 MG tablet Take 1 tablet (2.5 mg total) by mouth daily at 6 PM.  30 tablet  3  . zolpidem (AMBIEN) 5 MG tablet Take 1 tablet (5 mg total) by mouth at bedtime as needed for sleep.  30 tablet  0   No current facility-administered medications for this visit.    Allergies  Allergen Reactions  . Contrast Media [Iodinated Diagnostic Agents] Rash    19 years ago at [facility]    History   Social History  . Marital Status: Married    Spouse Name: N/A    Number of Children: 6  . 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 Smoking status: Never Smoker   . Smokeless tobacco: Never Used  . Alcohol Use: No  . Drug Use: No  . Sexual Activity: Not on file   Other Topics Concern  . Not on file   Social History Narrative  . No narrative on file     Review of Systems: General: negative for chills, fever, night sweats or weight changes.  Cardiovascular: negative for chest pain, dyspnea on exertion, edema, orthopnea, palpitations, paroxysmal nocturnal dyspnea or shortness of breath Dermatological: negative for rash Respiratory: negative for cough or wheezing Urologic: negative for hematuria Abdominal: negative for nausea, vomiting, diarrhea, bright red blood per rectum, melena, or hematemesis Neurologic: negative for visual changes, syncope, or dizziness All other systems reviewed and are otherwise negative except as noted above.    Blood pressure 132/86, pulse 64, height 5\' 9"  (1.753 m), weight 153 lb 3.2 oz (69.491 kg).  General appearance: alert, cooperative and no distress Neck: no carotid bruit  and no JVD Lungs: clear to auscultation bilaterally Heart: regular rate and rhythm  EKG NSR, LAD, septal Qs  ASSESSMENT AND PLAN:   S/P CABG x 4 12/04/13 Initial post hospital visit  PAF (paroxysmal atrial fibrillation) He had pre and post op, NSR on Amiuodarone  Anticoagulated on warfarin INR to be checked today  Cardiomyopathy, ischemic- EF 40-45% No CHF symptoms    PLAN  I did not change his medications except for a month supply of Ambien prn. He says he has tried several OTC preparations without results. He says he did not have any problems sleeping pre op. I reassured the pt and his daughter that this was not unusual and should pass.   Edgar Thomas KPA-C 01/02/2014 2:22 PM

## 2014-01-02 NOTE — Assessment & Plan Note (Signed)
Initial post hospital visit

## 2014-01-02 NOTE — Assessment & Plan Note (Signed)
INR to be checked today. 

## 2014-01-02 NOTE — Assessment & Plan Note (Signed)
No CHF symptoms 

## 2014-01-02 NOTE — Assessment & Plan Note (Signed)
He had pre and post op, NSR on Amiuodarone

## 2014-01-07 ENCOUNTER — Other Ambulatory Visit: Payer: Self-pay | Admitting: Cardiothoracic Surgery

## 2014-01-07 DIAGNOSIS — I480 Paroxysmal atrial fibrillation: Secondary | ICD-10-CM

## 2014-01-08 ENCOUNTER — Ambulatory Visit
Admission: RE | Admit: 2014-01-08 | Discharge: 2014-01-08 | Disposition: A | Discharge status: Home / Self Care | Payer: Medicare Other | Source: Ambulatory Visit | Attending: Cardiothoracic Surgery | Admitting: Cardiothoracic Surgery

## 2014-01-08 ENCOUNTER — Ambulatory Visit (INDEPENDENT_AMBULATORY_CARE_PROVIDER_SITE_OTHER): Payer: Self-pay | Admitting: Cardiothoracic Surgery

## 2014-01-08 ENCOUNTER — Encounter: Payer: Self-pay | Admitting: Cardiothoracic Surgery

## 2014-01-08 VITALS — BP 145/84 | HR 54 | Resp 20 | Ht 69.0 in | Wt 153.0 lb

## 2014-01-08 DIAGNOSIS — I2510 Atherosclerotic heart disease of native coronary artery without angina pectoris: Secondary | ICD-10-CM

## 2014-01-08 DIAGNOSIS — I209 Angina pectoris, unspecified: Secondary | ICD-10-CM

## 2014-01-08 DIAGNOSIS — I25119 Atherosclerotic heart disease of native coronary artery with unspecified angina pectoris: Secondary | ICD-10-CM

## 2014-01-08 DIAGNOSIS — I480 Paroxysmal atrial fibrillation: Secondary | ICD-10-CM

## 2014-01-08 DIAGNOSIS — Z951 Presence of aortocoronary bypass graft: Secondary | ICD-10-CM

## 2014-01-08 NOTE — Progress Notes (Signed)
[address]       [place] [id number]             [phone]      [facility] Medical Record #[id number] Date of Birth: 12/12/1929  Referring: Konrad Felixhomas, Brad, MD Primary Care: Konrad FelixHOMAS,BRAD, MD  Chief Complaint:   POST OP FOLLOW UP 12/03/2013  Pre OPERATIVE DIAGNOSIS: Coronary occlusive disease with recent  subendocardial myocardial infarction.  Post OPERATIVE DIAGNOSIS: Coronary occlusive disease with recent  subendocardial myocardial infarction.  SURGICAL PROCEDURE: Coronary artery bypass grafting x4 with the left  internal mammary to left anterior descending coronary artery, sequential  reverse saphenous vein graft to the first and second obtuse marginal,  reverse saphenous vein graft to the posterior descending with right  thigh and calf endo vein harvesting.   History of Present Illness:     Patient returns to the office today after his recent coronary bypass grafting. He is making reasonable progress postoperatively he's had no recurrent episodes of angina or evidence of congestive heart failure. He started cardiac rehabilitation in [place] but quit because the other patients there were going out to smoke. [facility]. Postoperative the patient developed multiple episodes of atrial fibrillation and was ultimately discharged home on by mouth amiodarone and Coumadin. He denies being aware of any further atrial fibrillation since discharge home. He was seen in the cardiology office last week but without any changes to his amiodarone or Coumadin.     Past Medical History  Diagnosis Date  . Hyperlipemia   . CAD (coronary artery disease)   . Heartburn   . A-fib   . Hypertension   . Heart attack   . Constipation   . GERD (gastroesophageal reflux disease)      History  Smoking status  . Never Smoker   Smokeless tobacco  . Never Used    History  Alcohol Use No     Allergies  Allergen Reactions  .  Contrast Media [Iodinated Diagnostic Agents] Rash    [facility]    Current Outpatient Prescriptions  Medication Sig Dispense Refill  . amiodarone (PACERONE) 200 MG tablet Take 1 tablet (200 mg total) by mouth 2 (two) times daily. For 7 Days, then decrease to 200 mg daily  60 tablet  3  . aspirin EC 81 MG EC tablet Take 1 tablet (81 mg total) by mouth daily.      . metoprolol tartrate (LOPRESSOR) 25 MG tablet Take 1 tablet (25 mg total) by mouth 2 (two) times daily.  60 tablet  3  . Multiple Vitamin (MULTIVITAMIN) capsule Take 1 capsule by mouth daily.      . niacin (NIASPAN) 1000 MG CR tablet Take 1,000 mg by mouth at bedtime.      . Omega 3 1000 MG CAPS Take 1,000 mg by mouth daily.       Marland Kitchen. omeprazole (PRILOSEC) 20 MG capsule Take 20 mg by mouth daily as needed (acid reflux).       . rosuvastatin (CRESTOR) 40 MG tablet Take 40 mg by mouth daily.      . Sodium Chloride-Sodium Bicarb (NETI POT SINUS WASH NA) Place 1 application into the nose daily as needed (congestion).      . traMADol (ULTRAM) 50 MG tablet Take 1-2 tablets (50-100 mg total) by mouth every 4 (four) hours as needed for moderate pain.  30 tablet  0  .  Vitamin D, Cholecalciferol, 1000 UNITS TABS Take 1,000 Units by mouth daily.       Marland Kitchen warfarin (COUMADIN) 2.5 MG tablet Take 1 tablet (2.5 mg total) by mouth daily at 6 PM.  30 tablet  3  . zolpidem (AMBIEN) 5 MG tablet Take 1 tablet (5 mg total) by mouth at bedtime as needed for sleep.  30 tablet  0   No current facility-administered medications for this visit.       Physical Exam: BP 145/84  Pulse 54  Resp 20  Ht 5\' 9"  (1.753 m)  Wt 153 lb (69.4 kg)  BMI 22.58 kg/m2  SpO2 98%  General appearance: alert, cooperative and appears stated age Neurologic: intact Heart: regular rate and rhythm, S1, S2 normal, no murmur, click, rub or gallop Lungs: clear to auscultation bilaterally Abdomen: soft, non-tender; bowel sounds normal; no masses,  no  organomegaly Extremities: extremities normal, atraumatic, no cyanosis or edema and Homans sign is negative, no sign of DVT Wound: Sternum is stable and well healed   Diagnostic Studies & Laboratory data:     Recent Radiology Findings:   Dg Chest 2 View  01/08/2014   CLINICAL DATA:  Paroxysmal atrial fibrillation, prior CABG  EXAM: CHEST  2 VIEW  COMPARISON:  Chest x-ray of 12/07/2013  FINDINGS: The left effusion has decreased in volume, with only small pleural effusions remaining. Aeration has improved, with resolution of left basilar atelectasis. Cardiomegaly is stable. Median sternotomy sutures are noted.  IMPRESSION: Improved aeration with decrease in small effusions and basilar atelectasis.   Electronically Signed   By: Dwyane Dee M.D.   On: 01/08/2014 12:02      Recent Lab Findings: Lab Results  Component Value Date   WBC 7.0 12/11/2013   HGB 9.4* 12/11/2013   HCT 28.2* 12/11/2013   PLT 193 12/11/2013   GLUCOSE 103* 12/11/2013   ALT 33 12/01/2013   AST 36 12/01/2013   NA 139 12/11/2013   K 4.1 12/11/2013   CL 101 12/11/2013   CREATININE 1.09 12/11/2013   BUN 30* 12/11/2013   CO2 27 12/11/2013   TSH 4.530* 12/10/2013   INR 1.8 01/02/2014   HGBA1C 5.8* 12/01/2013      Assessment / Plan:    1 Patient status post coronary artery bypass grafting stable without evidence of congestive heart failure or angina. #2 postoperative atrial fibrillation-intermittent now on Coumadin and amiodarone without evidence of further atrial fibrillation- patient would like to stop this  #3 mild elevation of TSH on amiodarone   Patient has followup cardiology appointment August 21  I plan to see back in 6 weeks   Delight Ovens MD      [address] [place] [place], [address] Office [phone]   Beeper [phone]  01/08/2014 2:09 PM

## 2014-01-15 ENCOUNTER — Encounter (HOSPITAL_COMMUNITY)
Admission: RE | Admit: 2014-01-15 | Discharge: 2014-01-15 | Disposition: A | Discharge status: Home / Self Care | Payer: Medicare Other | Source: Ambulatory Visit | Attending: Cardiology | Admitting: Cardiology

## 2014-01-15 DIAGNOSIS — Z5189 Encounter for other specified aftercare: Secondary | ICD-10-CM | POA: Insufficient documentation

## 2014-01-15 DIAGNOSIS — Z951 Presence of aortocoronary bypass graft: Secondary | ICD-10-CM | POA: Insufficient documentation

## 2014-01-15 DIAGNOSIS — I2589 Other forms of chronic ischemic heart disease: Secondary | ICD-10-CM | POA: Insufficient documentation

## 2014-01-15 DIAGNOSIS — I2510 Atherosclerotic heart disease of native coronary artery without angina pectoris: Secondary | ICD-10-CM | POA: Insufficient documentation

## 2014-01-15 DIAGNOSIS — I4891 Unspecified atrial fibrillation: Secondary | ICD-10-CM | POA: Insufficient documentation

## 2014-01-15 NOTE — Progress Notes (Signed)
Cardiac Rehab Medication Review by a Pharmacist  Does the patient  feel that his/her medications are working for him/her?  yes  Has the patient been experiencing any side effects to the medications prescribed?  Yes - Pt states sleeping has been an issue but doesn't know if related to any medications  Does the patient measure his/her own blood pressure or blood glucose at home?  yes -blood pressure  Does the patient have any problems obtaining medications due to transportation or finances?   no  Understanding of regimen: excellent Understanding of indications: excellent Potential of compliance: excellent    Pharmacist comments: Patient and wife have excellent understanding of medication regimen and rate compliance as good    Babs BertinBaird, Airrion Otting P 01/15/2014 8:30 AM

## 2014-01-16 ENCOUNTER — Ambulatory Visit (INDEPENDENT_AMBULATORY_CARE_PROVIDER_SITE_OTHER): Payer: Medicare Other | Admitting: Pharmacist Clinician (PhC)/ Clinical Pharmacy Specialist

## 2014-01-16 DIAGNOSIS — I4891 Unspecified atrial fibrillation: Secondary | ICD-10-CM

## 2014-01-16 DIAGNOSIS — I480 Paroxysmal atrial fibrillation: Secondary | ICD-10-CM

## 2014-01-16 DIAGNOSIS — Z5181 Encounter for therapeutic drug level monitoring: Secondary | ICD-10-CM

## 2014-01-16 LAB — POCT INR: INR: 2.8

## 2014-01-19 ENCOUNTER — Encounter (HOSPITAL_COMMUNITY)
Admission: RE | Admit: 2014-01-19 | Discharge: 2014-01-19 | Disposition: A | Discharge status: Home / Self Care | Payer: Medicare Other | Source: Ambulatory Visit | Attending: Cardiology | Admitting: Cardiology

## 2014-01-19 DIAGNOSIS — I2510 Atherosclerotic heart disease of native coronary artery without angina pectoris: Secondary | ICD-10-CM | POA: Diagnosis not present

## 2014-01-19 DIAGNOSIS — I2589 Other forms of chronic ischemic heart disease: Secondary | ICD-10-CM | POA: Diagnosis not present

## 2014-01-19 DIAGNOSIS — Z5189 Encounter for other specified aftercare: Secondary | ICD-10-CM | POA: Diagnosis present

## 2014-01-19 DIAGNOSIS — I4891 Unspecified atrial fibrillation: Secondary | ICD-10-CM | POA: Diagnosis present

## 2014-01-19 DIAGNOSIS — Z951 Presence of aortocoronary bypass graft: Secondary | ICD-10-CM | POA: Diagnosis not present

## 2014-01-19 NOTE — Progress Notes (Signed)
Pt started cardiac rehab today.  Pt tolerated light exercise without difficulty. Telemetry rhythm Sinus with a downward QRS. Vital signs stable. Edgar Thomas's goals are to learn about safe exercise intensities and to get back to normal.Will continue to monitor the patient throughout  the program.

## 2014-01-21 ENCOUNTER — Encounter (HOSPITAL_COMMUNITY)
Admission: RE | Admit: 2014-01-21 | Discharge: 2014-01-21 | Disposition: A | Discharge status: Home / Self Care | Payer: Medicare Other | Source: Ambulatory Visit | Attending: Cardiology | Admitting: Cardiology

## 2014-01-21 DIAGNOSIS — Z5189 Encounter for other specified aftercare: Secondary | ICD-10-CM | POA: Diagnosis not present

## 2014-01-23 ENCOUNTER — Encounter (HOSPITAL_COMMUNITY)
Admission: RE | Admit: 2014-01-23 | Discharge: 2014-01-23 | Disposition: A | Discharge status: Home / Self Care | Payer: Medicare Other | Source: Ambulatory Visit | Attending: Cardiology | Admitting: Cardiology

## 2014-01-23 DIAGNOSIS — Z5189 Encounter for other specified aftercare: Secondary | ICD-10-CM | POA: Diagnosis not present

## 2014-01-26 ENCOUNTER — Encounter (HOSPITAL_COMMUNITY)
Admission: RE | Admit: 2014-01-26 | Discharge: 2014-01-26 | Disposition: A | Discharge status: Home / Self Care | Payer: Medicare Other | Source: Ambulatory Visit | Attending: Cardiology | Admitting: Cardiology

## 2014-01-26 DIAGNOSIS — Z5189 Encounter for other specified aftercare: Secondary | ICD-10-CM | POA: Diagnosis not present

## 2014-01-26 NOTE — Progress Notes (Signed)
Reviewed home exercise with pt today.  Pt plans to walk and use treadmill at home for exercise.  Reviewed THR, pulse, RPE, sign and symptoms, NTG use, and when to call 911 or MD.  Pt voiced understanding. Oral Remache, MA, ACSM RCEP  

## 2014-01-28 ENCOUNTER — Encounter (HOSPITAL_COMMUNITY)
Admission: RE | Admit: 2014-01-28 | Discharge: 2014-01-28 | Disposition: A | Discharge status: Home / Self Care | Payer: Medicare Other | Source: Ambulatory Visit | Attending: Cardiology | Admitting: Cardiology

## 2014-01-28 DIAGNOSIS — Z951 Presence of aortocoronary bypass graft: Secondary | ICD-10-CM | POA: Diagnosis not present

## 2014-01-28 DIAGNOSIS — I4891 Unspecified atrial fibrillation: Secondary | ICD-10-CM | POA: Insufficient documentation

## 2014-01-28 DIAGNOSIS — I2510 Atherosclerotic heart disease of native coronary artery without angina pectoris: Secondary | ICD-10-CM | POA: Insufficient documentation

## 2014-01-28 DIAGNOSIS — I2589 Other forms of chronic ischemic heart disease: Secondary | ICD-10-CM | POA: Insufficient documentation

## 2014-01-28 DIAGNOSIS — Z5189 Encounter for other specified aftercare: Secondary | ICD-10-CM | POA: Insufficient documentation

## 2014-01-28 NOTE — Progress Notes (Signed)
Intermittent PAC's noted today during exercise today. Patient remains in Sinus rhythm. Vital signs stable. Will fax exercise flow sheets to Dr. Jenene Slicker office for review.

## 2014-01-30 ENCOUNTER — Ambulatory Visit (INDEPENDENT_AMBULATORY_CARE_PROVIDER_SITE_OTHER): Payer: Medicare Other | Admitting: Pharmacist Clinician (PhC)/ Clinical Pharmacy Specialist

## 2014-01-30 ENCOUNTER — Encounter (HOSPITAL_COMMUNITY)
Admission: RE | Admit: 2014-01-30 | Discharge: 2014-01-30 | Disposition: A | Discharge status: Home / Self Care | Payer: Medicare Other | Source: Ambulatory Visit | Attending: Cardiology | Admitting: Cardiology

## 2014-01-30 DIAGNOSIS — I480 Paroxysmal atrial fibrillation: Secondary | ICD-10-CM

## 2014-01-30 DIAGNOSIS — Z5181 Encounter for therapeutic drug level monitoring: Secondary | ICD-10-CM

## 2014-01-30 DIAGNOSIS — Z5189 Encounter for other specified aftercare: Secondary | ICD-10-CM | POA: Diagnosis not present

## 2014-01-30 DIAGNOSIS — I4891 Unspecified atrial fibrillation: Secondary | ICD-10-CM

## 2014-01-30 LAB — POCT INR: INR: 2.7

## 2014-02-02 ENCOUNTER — Encounter (HOSPITAL_COMMUNITY): Payer: Medicare Other

## 2014-02-04 ENCOUNTER — Encounter (HOSPITAL_COMMUNITY)
Admission: RE | Admit: 2014-02-04 | Discharge: 2014-02-04 | Disposition: A | Discharge status: Home / Self Care | Payer: Medicare Other | Source: Ambulatory Visit | Attending: Cardiology | Admitting: Cardiology

## 2014-02-04 DIAGNOSIS — Z5189 Encounter for other specified aftercare: Secondary | ICD-10-CM | POA: Diagnosis not present

## 2014-02-05 NOTE — Progress Notes (Signed)
Wyley's weight is up 1 kg from last Friday. Janziel denies any shortness of breath.  Lung fields clear upon ascultation. Bilateral ankle edema present greater on the right. Oxygen saturation 96% on room air. JC Dr Hochrein's nurse called and notfied. Will fax exercise flow sheets to Dr. Jenene Slicker office for review.

## 2014-02-06 ENCOUNTER — Encounter (HOSPITAL_COMMUNITY)
Admission: RE | Admit: 2014-02-06 | Discharge: 2014-02-06 | Disposition: A | Discharge status: Home / Self Care | Payer: Medicare Other | Source: Ambulatory Visit | Attending: Cardiology | Admitting: Cardiology

## 2014-02-06 DIAGNOSIS — Z5189 Encounter for other specified aftercare: Secondary | ICD-10-CM | POA: Diagnosis not present

## 2014-02-09 ENCOUNTER — Encounter (HOSPITAL_COMMUNITY)
Admission: RE | Admit: 2014-02-09 | Discharge: 2014-02-09 | Disposition: A | Discharge status: Home / Self Care | Payer: Medicare Other | Source: Ambulatory Visit | Attending: Cardiology | Admitting: Cardiology

## 2014-02-09 DIAGNOSIS — Z5189 Encounter for other specified aftercare: Secondary | ICD-10-CM | POA: Diagnosis not present

## 2014-02-11 ENCOUNTER — Encounter (HOSPITAL_COMMUNITY)
Admission: RE | Admit: 2014-02-11 | Discharge: 2014-02-11 | Disposition: A | Discharge status: Home / Self Care | Payer: Medicare Other | Source: Ambulatory Visit | Attending: Cardiology | Admitting: Cardiology

## 2014-02-11 DIAGNOSIS — Z5189 Encounter for other specified aftercare: Secondary | ICD-10-CM | POA: Diagnosis not present

## 2014-02-11 NOTE — Progress Notes (Signed)
Edgar Thomas 80 y.o. male Nutrition Note Spoke with pt.  Nutrition Survey reviewed with pt. Pt is following Step 2 of the Therapeutic Lifestyle Changes diet. Pt reports he has been to the [facility] 12 times. Pt is a "vegan and I eat wild caught Edgar once a week." Pt is pre-diabetic according to his A1c. Pre-diabetes discussed and pt encouraged to discuss pre-diabetes further with his PCP. Pt expressed understanding of the information reviewed. Pt aware of nutrition education classes offered.  Nutrition Diagnosis   Food-and nutrition-related knowledge deficit related to lack of exposure to information as related to diagnosis of: ? CVD ? Pre-DM (A1c 5.8) ?  Nutrition Intervention   Benefits of adopting Therapeutic Lifestyle Changes discussed when Medficts reviewed.   Pt to attend the Portion Distortion class   Pt given handouts for: ? Nutrition I class ? Nutrition II class   Continue client-centered nutrition education by RD, as part of interdisciplinary care.  Goal(s)   Pt to describe the benefit of including fruits, vegetables, whole grains, and low-fat dairy products in a heart healthy meal plan.  Monitor and Evaluate progress toward nutrition goal with team.  Mickle Plumb, M.Ed, RD, LDN, CDE 02/11/2014 3:46 PM

## 2014-02-13 ENCOUNTER — Encounter: Payer: Self-pay | Admitting: Cardiology

## 2014-02-13 ENCOUNTER — Encounter (HOSPITAL_COMMUNITY)
Admission: RE | Admit: 2014-02-13 | Discharge: 2014-02-13 | Disposition: A | Discharge status: Home / Self Care | Payer: Medicare Other | Source: Ambulatory Visit | Attending: Cardiology | Admitting: Cardiology

## 2014-02-13 DIAGNOSIS — Z5189 Encounter for other specified aftercare: Secondary | ICD-10-CM | POA: Diagnosis not present

## 2014-02-16 ENCOUNTER — Encounter (HOSPITAL_COMMUNITY)
Admission: RE | Admit: 2014-02-16 | Discharge: 2014-02-16 | Disposition: A | Discharge status: Home / Self Care | Payer: Medicare Other | Source: Ambulatory Visit | Attending: Cardiology | Admitting: Cardiology

## 2014-02-16 DIAGNOSIS — Z5189 Encounter for other specified aftercare: Secondary | ICD-10-CM | POA: Diagnosis not present

## 2014-02-18 ENCOUNTER — Encounter (HOSPITAL_COMMUNITY)
Admission: RE | Admit: 2014-02-18 | Discharge: 2014-02-18 | Disposition: A | Discharge status: Home / Self Care | Payer: Medicare Other | Source: Ambulatory Visit | Attending: Cardiology | Admitting: Cardiology

## 2014-02-18 DIAGNOSIS — Z5189 Encounter for other specified aftercare: Secondary | ICD-10-CM | POA: Diagnosis not present

## 2014-02-20 ENCOUNTER — Telehealth: Payer: Self-pay | Admitting: Cardiology

## 2014-02-20 ENCOUNTER — Ambulatory Visit (INDEPENDENT_AMBULATORY_CARE_PROVIDER_SITE_OTHER): Payer: Medicare Other | Admitting: Pharmacist Clinician (PhC)/ Clinical Pharmacy Specialist

## 2014-02-20 ENCOUNTER — Encounter (HOSPITAL_COMMUNITY)
Admission: RE | Admit: 2014-02-20 | Discharge: 2014-02-20 | Disposition: A | Discharge status: Home / Self Care | Payer: Medicare Other | Source: Ambulatory Visit | Attending: Cardiology | Admitting: Cardiology

## 2014-02-20 DIAGNOSIS — Z5181 Encounter for therapeutic drug level monitoring: Secondary | ICD-10-CM

## 2014-02-20 DIAGNOSIS — I480 Paroxysmal atrial fibrillation: Secondary | ICD-10-CM

## 2014-02-20 DIAGNOSIS — Z5189 Encounter for other specified aftercare: Secondary | ICD-10-CM | POA: Diagnosis not present

## 2014-02-20 DIAGNOSIS — I4891 Unspecified atrial fibrillation: Secondary | ICD-10-CM

## 2014-02-20 LAB — POCT INR: INR: 2.9

## 2014-02-20 NOTE — Telephone Encounter (Signed)
Returned call to patient he stated for the last 2 weeks he has noticed swelling in both ankles.Stated he wants to know what is causing his ankles to swell.Stated he has never had swelling before.Stated wt is stable.No sob.Stated he watches his diet close no salt.Stated when he elevates ankles swelling goes down.Stated he has appointment with Dr.Hochrein 03/02/14,but would like advice on ankles swelling.Message sent to Dr.Hochrein for advice.

## 2014-02-20 NOTE — Telephone Encounter (Signed)
Mr.Schwartzkopf is calling because his ankles are swelling and when he puts them up they may go down , but when he walks for a little while they swell back up . He seems to think it may be the sleeping pills that he has been taking. Ambien  .. Please call on home number or wife cell .Marland [phone]   Thanks

## 2014-02-23 ENCOUNTER — Encounter (HOSPITAL_COMMUNITY)
Admission: RE | Admit: 2014-02-23 | Discharge: 2014-02-23 | Disposition: A | Discharge status: Home / Self Care | Payer: Medicare Other | Source: Ambulatory Visit | Attending: Cardiology | Admitting: Cardiology

## 2014-02-23 DIAGNOSIS — Z5189 Encounter for other specified aftercare: Secondary | ICD-10-CM | POA: Diagnosis not present

## 2014-02-23 NOTE — Telephone Encounter (Signed)
Talked to pt and he stated he had gotten some comp. Stockings and was doing better, and would see [place] on the 5th of oct.

## 2014-02-25 ENCOUNTER — Encounter: Payer: Self-pay | Admitting: *Deleted

## 2014-02-25 ENCOUNTER — Encounter (HOSPITAL_COMMUNITY)
Admission: RE | Admit: 2014-02-25 | Discharge: 2014-02-25 | Disposition: A | Discharge status: Home / Self Care | Payer: Medicare Other | Source: Ambulatory Visit | Attending: Cardiology | Admitting: Cardiology

## 2014-02-25 DIAGNOSIS — Z5189 Encounter for other specified aftercare: Secondary | ICD-10-CM | POA: Diagnosis not present

## 2014-02-25 NOTE — Progress Notes (Signed)
[address]       [place] [address]             [phone]      [facility] Medical Record #[id number] Date of Birth: 03-13-1930  Referring: Konrad Felix, MD Primary Care: Konrad Felix, MD  Chief Complaint:   POST OP FOLLOW UP 12/03/2013  Pre OPERATIVE DIAGNOSIS: Coronary occlusive disease with recent  subendocardial myocardial infarction.  Post OPERATIVE DIAGNOSIS: Coronary occlusive disease with recent  subendocardial myocardial infarction.  SURGICAL PROCEDURE: Coronary artery bypass grafting x4 with the left  internal mammary to left anterior descending coronary artery, sequential  reverse saphenous vein graft to the first and second obtuse marginal,  reverse saphenous vein graft to the posterior descending with right  thigh and calf endo vein harvesting.   History of Present Illness:     Patient returns to the office today after  coronary bypass grafting in July. He is making reasonable progress postoperatively he's had no recurrent episodes of angina or evidence of congestive heart failure. He started cardiac rehabilitation in [place] but quit because the other patients there were going out to smoke. [facility]   Postoperative the patient developed multiple episodes of atrial fibrillation and was ultimately discharged home on by mouth amiodarone and Coumadin. He denies being aware of any further atrial fibrillation since discharge home. He was seen in the cardiology office by Corine Shelter but without any changes to his amiodarone or Coumadin.     Past Medical History  Diagnosis Date  . Hyperlipemia   . CAD (coronary artery disease)   . Heartburn   . A-fib   . Hypertension   . Heart attack   . Constipation   . GERD (gastroesophageal reflux disease)      History  Smoking status  . Never Smoker   Smokeless tobacco  . Never Used    History  Alcohol Use No     Allergies  Allergen Reactions  .  Contrast Media [Iodinated Diagnostic Agents] Rash    19 years ago at [facility]    Current Outpatient Prescriptions  Medication Sig Dispense Refill  . amiodarone (PACERONE) 200 MG tablet Take 1 tablet (200 mg total) by mouth 2 (two) times daily. For 7 Days, then decrease to 200 mg daily  60 tablet  3  . aspirin EC 81 MG EC tablet Take 1 tablet (81 mg total) by mouth daily.      . metoprolol tartrate (LOPRESSOR) 25 MG tablet Take 1 tablet (25 mg total) by mouth 2 (two) times daily.  60 tablet  3  . Multiple Vitamin (MULTIVITAMIN) capsule Take 1 capsule by mouth daily.      . niacin (NIASPAN) 1000 MG CR tablet Take 1,000 mg by mouth every morning.       . Omega 3 1000 MG CAPS Take 1,000 mg by mouth daily.       . rosuvastatin (CRESTOR) 40 MG tablet Take 40 mg by mouth daily.      . Vitamin D, Cholecalciferol, 1000 UNITS TABS Take 1,000 Units by mouth daily.       Marland Kitchen warfarin (COUMADIN) 2.5 MG tablet Take 1 tablet (2.5 mg total) by mouth daily at 6 PM.  30 tablet  3   No current facility-administered medications for this visit.       Physical Exam: BP 165/90  Pulse 60  Ht 5\' 8"  (1.727 m)  Wt  153 lb (69.4 kg)  BMI 23.27 kg/m2  SpO2 98%  General appearance: alert, cooperative and appears stated age Neurologic: intact Heart: regular rate and rhythm, S1, S2 normal, no murmur, click, rub or gallop Lungs: clear to auscultation bilaterally Abdomen: soft, non-tender; bowel sounds normal; no masses,  no organomegaly Extremities: extremities normal, atraumatic, no cyanosis or edema and Homans sign is negative, no sign of DVT Wound: Sternum is stable and well healed   Diagnostic Studies & Laboratory data:     Recent Radiology Findings:   No results found.    Recent Lab Findings: Lab Results  Component Value Date   WBC 7.0 12/11/2013   HGB 9.4* 12/11/2013   HCT 28.2* 12/11/2013   PLT 193 12/11/2013   GLUCOSE 103* 12/11/2013   ALT 33 12/01/2013   AST 36 12/01/2013   NA 139  12/11/2013   K 4.1 12/11/2013   CL 101 12/11/2013   CREATININE 1.09 12/11/2013   BUN 30* 12/11/2013   CO2 27 12/11/2013   TSH 4.530* 12/10/2013   INR 2.9 02/20/2014   HGBA1C 5.8* 12/01/2013      Assessment / Plan:    #1 Patient status post coronary artery bypass grafting stable without evidence of congestive heart failure or angina. #2 postoperative atrial fibrillation-intermittent now on Coumadin and amiodarone without evidence of further atrial fibrillation- patient would like to stop  #3 mild elevation of TSH on amiodarone  Patient's to have appointment with cardiology next week and will discuss stopping Coumadin and amiodarone. Overall am very pleased with his progress, especially considering his reluctance to have surgery in July in the face of severe three-vessel coronary artery disease and symptoms. I've not made a return appointment to see him but would be glad to see him at his or her cardiologist requested anytime.   Delight OvensEdward B Deshane Cotroneo MD      [address] [place] [id number] Office [phone]   Beeper [phone]  02/26/2014 1:42 PM

## 2014-02-26 ENCOUNTER — Encounter: Payer: Self-pay | Admitting: Cardiothoracic Surgery

## 2014-02-26 ENCOUNTER — Ambulatory Visit (INDEPENDENT_AMBULATORY_CARE_PROVIDER_SITE_OTHER): Payer: Self-pay | Admitting: Cardiothoracic Surgery

## 2014-02-26 VITALS — BP 165/90 | HR 60 | Ht 68.0 in | Wt 153.0 lb

## 2014-02-26 DIAGNOSIS — Z951 Presence of aortocoronary bypass graft: Secondary | ICD-10-CM

## 2014-02-26 NOTE — Progress Notes (Signed)
Heart rate noted at 52 sinus brady nonsustained at cardiac rehab.  Vital signs stable patient asymptomatic.Will fax exercise flow sheets to Dr. Jenene SlickerHochrein's office for review with ECG tracings.

## 2014-02-27 ENCOUNTER — Encounter (HOSPITAL_COMMUNITY)
Admission: RE | Admit: 2014-02-27 | Discharge: 2014-02-27 | Disposition: A | Discharge status: Home / Self Care | Payer: Medicare Other | Source: Ambulatory Visit | Attending: Cardiology | Admitting: Cardiology

## 2014-02-27 ENCOUNTER — Other Ambulatory Visit: Payer: Self-pay | Admitting: Physician Assistant

## 2014-02-27 ENCOUNTER — Other Ambulatory Visit: Payer: Self-pay | Admitting: Cardiology

## 2014-02-27 ENCOUNTER — Telehealth: Payer: Self-pay | Admitting: Physician Assistant

## 2014-02-27 DIAGNOSIS — Z951 Presence of aortocoronary bypass graft: Secondary | ICD-10-CM | POA: Diagnosis present

## 2014-02-27 NOTE — Telephone Encounter (Signed)
Patient would like a refill on his generic Ambien 5 MG # 30 called to [facility] in [place].  Please let patient know when this has been done so he can pick it up.

## 2014-02-27 NOTE — Telephone Encounter (Signed)
    Needed refills on his ambien. I verbally called in for him   Cline CrockKathryn Fatin Bachicha PA-C  [facility]

## 2014-03-02 ENCOUNTER — Encounter: Payer: Self-pay | Admitting: Cardiology

## 2014-03-02 ENCOUNTER — Encounter (HOSPITAL_COMMUNITY)
Admission: RE | Admit: 2014-03-02 | Discharge: 2014-03-02 | Disposition: A | Discharge status: Home / Self Care | Payer: Medicare Other | Source: Ambulatory Visit | Attending: Cardiology | Admitting: Cardiology

## 2014-03-02 ENCOUNTER — Ambulatory Visit (INDEPENDENT_AMBULATORY_CARE_PROVIDER_SITE_OTHER): Payer: Medicare Other | Admitting: Pharmacist Clinician (PhC)/ Clinical Pharmacy Specialist

## 2014-03-02 ENCOUNTER — Ambulatory Visit (INDEPENDENT_AMBULATORY_CARE_PROVIDER_SITE_OTHER): Payer: Medicare Other | Admitting: Cardiology

## 2014-03-02 VITALS — BP 152/83 | HR 79 | Ht 69.0 in | Wt 155.1 lb

## 2014-03-02 DIAGNOSIS — I255 Ischemic cardiomyopathy: Secondary | ICD-10-CM

## 2014-03-02 DIAGNOSIS — Z5181 Encounter for therapeutic drug level monitoring: Secondary | ICD-10-CM

## 2014-03-02 DIAGNOSIS — Z951 Presence of aortocoronary bypass graft: Secondary | ICD-10-CM

## 2014-03-02 DIAGNOSIS — I480 Paroxysmal atrial fibrillation: Secondary | ICD-10-CM

## 2014-03-02 LAB — POCT INR: INR: 3.4

## 2014-03-02 MED ORDER — RIVAROXABAN 20 MG PO TABS: 20.0000 mg | ORAL_TABLET | Freq: Every day | ORAL | Status: DC

## 2014-03-02 NOTE — Progress Notes (Signed)
HPI The patient presents for evaluation. He is actually new to me. He has been seen before by our group. He has known coronary disease and is status post bypass surgery earlier this summer. He has a mildly reduced ejection fraction. He said since he was first evaluated he's had no new cardiovascular complaints. He did initially present with atrial fibrillation but he never felt these palpitations. He's doing cardiac rehabilitation. He denies any palpitations, presyncope or syncope. He's not having any chest pressure, neck or arm discomfort. He's had no weight gain or edema.  Allergies  Allergen Reactions  . Contrast Media [Iodinated Diagnostic Agents] Rash    19 years ago at [facility]    Current Outpatient Prescriptions  Medication Sig Dispense Refill  . amiodarone (PACERONE) 200 MG tablet Take 1 tablet (200 mg total) by mouth 2 (two) times daily. For 7 Days, then decrease to 200 mg daily  60 tablet  3  . aspirin EC 81 MG EC tablet Take 1 tablet (81 mg total) by mouth daily.      . metoprolol tartrate (LOPRESSOR) 25 MG tablet Take 1 tablet (25 mg total) by mouth 2 (two) times daily.  60 tablet  3  . Multiple Vitamin (MULTIVITAMIN) capsule Take 1 capsule by mouth daily.      . niacin (NIASPAN) 1000 MG CR tablet Take 1,000 mg by mouth every morning.       . Omega 3 1000 MG CAPS Take 1,000 mg by mouth daily.       . rosuvastatin (CRESTOR) 40 MG tablet Take 40 mg by mouth daily.      . Vitamin D, Cholecalciferol, 1000 UNITS TABS Take 1,000 Units by mouth daily.       Marland Kitchen warfarin (COUMADIN) 2.5 MG tablet Take 1 tablet (2.5 mg total) by mouth daily at 6 PM.  30 tablet  3   No current facility-administered medications for this visit.    Past Medical History  Diagnosis Date  . Hyperlipemia   . CAD (coronary artery disease)   . Heartburn   . A-fib   . Hypertension   . Heart attack   . Constipation   . GERD (gastroesophageal reflux disease)     Past Surgical History    Procedure Laterality Date  . Balloon angioplasty of lad    . Colonoscopy    . Coronary artery bypass graft N/A 12/03/2013    Procedure: CORONARY ARTERY BYPASS GRAFTING (CABG) x4 using left internal mammary artery and right greater saphenous vein. LIMA to LAD, sequential SVG to OM 1 & OM 2, SVG to PD;  Surgeon: Delight Ovens, MD;  Location: [facility] OR;  Service: Open Heart Surgery;  Laterality: N/A;  . Intraoperative transesophageal echocardiogram N/A 12/03/2013    Procedure: INTRAOPERATIVE TRANSESOPHAGEAL ECHOCARDIOGRAM;  Surgeon: Delight Ovens, MD;  Location: [facility] OR;  Service: Open Heart Surgery;  Laterality: N/A;  . Endovein harvest of greater saphenous vein Right 12/03/2013    Procedure: ENDOVEIN HARVEST OF GREATER SAPHENOUS VEIN;  Surgeon: Delight Ovens, MD;  Location: [facility] OR;  Service: Open Heart Surgery;  Laterality: Right;    ROS:  As stated in the HPI and negative for all other systems.  PHYSICAL EXAM BP 152/83  Pulse 79  Ht 5\' 9"  (1.753 m)  Wt 155 lb 1.6 oz (70.353 kg)  BMI 22.89 kg/m2 GENERAL:  Well appearing HEENT:  Pupils equal round and reactive, fundi not visualized, oral mucosa unremarkable NECK:  No jugular venous  distention, waveform within normal limits, carotid upstroke brisk and symmetric, no bruits, no thyromegaly LYMPHATICS:  No cervical, inguinal adenopathy LUNGS:  Clear to auscultation bilaterally BACK:  No CVA tenderness CHEST:  Well healed sternotomy scar. HEART:  PMI not displaced or sustained,S1 and S2 within normal limits, no S3, no S4, no clicks, no rubs, no murmurs ABD:  Flat, positive bowel sounds normal in frequency in pitch, no bruits, no rebound, no guarding, no midline pulsatile mass, no hepatomegaly, no splenomegaly EXT:  2 plus pulses throughout, no edema, no cyanosis no clubbing SKIN:  No rashes no nodules NEURO:  Cranial nerves II through XII grossly intact, motor grossly intact throughout PSYCH:  Cognitively intact, oriented to person place and  time   ASSESSMENT AND PLAN  ATRIAL FIB:  We had a very long discussion about atrial fibrillation. At this point the patient should remain on anticoagulation. I think he is at risk for recurrent atrial fibrillation as this was presenting problem and not secondary.  Edgar Thomas has a CHA2DS2 - VASc score of  with a risk of stroke of 5.9%.  We clearly discussed the risks benefits of anticoagulation. I would choose to take him off warfarin and I will start Xarelto 20 mg daily.  He will stop his ASA and we discussed risk benefits of this..  I have chosen to stop his amiodarone.  He understands he could have recurrent atrial fibrillation and we will decide on further therapy at this occurs.  CAD:  The patient has no new sypmtoms.  No further cardiovascular testing is indicated.  We will continue with aggressive risk reduction and meds as listed.  DYSLIPIDEMIA:  We discussed the lack of data supporting the use of niacin. I also discussed recent data that does not support the use of omega-3 acid supplements. He will consider whether he wants to stop this as well as multivitamin.  HTN:  The blood pressure is at target. No change in medications is indicated. We will continue with therapeutic lifestyle changes (TLC).  CARDIOMYOPATHY:  I will consider med titration in the future in followup echocardiography. He is reluctant to take more medications.   (Greater than 40 minutes reviewing all data with greater than 50% face to face with the patient).

## 2014-03-02 NOTE — Patient Instructions (Addendum)
Your physician recommends that you schedule a follow-up appointment in: 3 months with Dr. Antoine PocheHochrein  Stop taking your Pacerone/amioderone  Stop taking your ASA two days from now

## 2014-03-03 ENCOUNTER — Telehealth: Payer: Self-pay | Admitting: Cardiology

## 2014-03-03 NOTE — Telephone Encounter (Signed)
Lee(pharmacist) was calling in for a refill request for zolpidem 5mg . Please call  Thanks

## 2014-03-03 NOTE — Telephone Encounter (Signed)
Left message on voice mail  to call back

## 2014-03-03 NOTE — Telephone Encounter (Signed)
Please call,he saw Dr Antoine PocheHochrein yesterday.There was a different in his medicine sheets.

## 2014-03-03 NOTE — Telephone Encounter (Signed)
Phoned in refill per Dr. Antoine PocheHochrein

## 2014-03-03 NOTE — Telephone Encounter (Signed)
Ok to renew?  

## 2014-03-03 NOTE — Telephone Encounter (Signed)
Mrs Javed stated at last appointment, her husband change his mind before leaving the office that he wants to continue with warfarin instead of changing to Xarelto. She wanted make sure an appointment was still available for patient with Belenda CruiseKristin. She states she will not pick up XARELTO. RN informed her that appointment is still available with Belenda CruiseKristin, will cancel XARELTO ORDER

## 2014-03-04 ENCOUNTER — Encounter (HOSPITAL_COMMUNITY)
Admission: RE | Admit: 2014-03-04 | Discharge: 2014-03-04 | Disposition: A | Discharge status: Home / Self Care | Payer: Medicare Other | Source: Ambulatory Visit | Attending: Cardiology | Admitting: Cardiology

## 2014-03-04 DIAGNOSIS — Z951 Presence of aortocoronary bypass graft: Secondary | ICD-10-CM | POA: Diagnosis not present

## 2014-03-06 ENCOUNTER — Encounter (HOSPITAL_COMMUNITY)
Admission: RE | Admit: 2014-03-06 | Discharge: 2014-03-06 | Disposition: A | Discharge status: Home / Self Care | Payer: Medicare Other | Source: Ambulatory Visit | Attending: Cardiology | Admitting: Cardiology

## 2014-03-06 DIAGNOSIS — Z951 Presence of aortocoronary bypass graft: Secondary | ICD-10-CM | POA: Diagnosis not present

## 2014-03-09 ENCOUNTER — Encounter (HOSPITAL_COMMUNITY)
Admission: RE | Admit: 2014-03-09 | Discharge: 2014-03-09 | Disposition: A | Discharge status: Home / Self Care | Payer: Medicare Other | Source: Ambulatory Visit | Attending: Cardiology | Admitting: Cardiology

## 2014-03-09 DIAGNOSIS — Z951 Presence of aortocoronary bypass graft: Secondary | ICD-10-CM | POA: Diagnosis not present

## 2014-03-11 ENCOUNTER — Encounter (HOSPITAL_COMMUNITY)
Admission: RE | Admit: 2014-03-11 | Discharge: 2014-03-11 | Disposition: A | Discharge status: Home / Self Care | Payer: Medicare Other | Source: Ambulatory Visit | Attending: Cardiology | Admitting: Cardiology

## 2014-03-11 DIAGNOSIS — Z951 Presence of aortocoronary bypass graft: Secondary | ICD-10-CM | POA: Diagnosis not present

## 2014-03-13 ENCOUNTER — Telehealth: Payer: Self-pay | Admitting: Cardiology

## 2014-03-13 ENCOUNTER — Encounter (HOSPITAL_COMMUNITY)
Admission: RE | Admit: 2014-03-13 | Discharge: 2014-03-13 | Disposition: A | Discharge status: Home / Self Care | Payer: Medicare Other | Source: Ambulatory Visit | Attending: Cardiology | Admitting: Cardiology

## 2014-03-13 ENCOUNTER — Telehealth: Payer: Self-pay

## 2014-03-13 DIAGNOSIS — Z951 Presence of aortocoronary bypass graft: Secondary | ICD-10-CM | POA: Diagnosis not present

## 2014-03-13 MED ORDER — METOPROLOL TARTRATE 25 MG PO TABS: 25.0000 mg | ORAL_TABLET | Freq: Two times a day (BID) | ORAL | Status: DC

## 2014-03-13 NOTE — Telephone Encounter (Signed)
Patient walked in office stated he has decided to stay on coumadin.Stated he feels safer on coumadin.Advised keep appointment for INR check 03/20/14 at 1:30 pm.

## 2014-03-13 NOTE — Telephone Encounter (Signed)
Please call,pt have been taking 25 mg of Metoprolol.Yesterday when he picked it up from the pharmacy it was 50 mg of Metoprolol. Which one is correct?

## 2014-03-13 NOTE — Telephone Encounter (Signed)
Returned call to patient's wife she stated she went to pick up metoprolol refill last night.Stated when she got home she noticed metoprolol pill different color tablet on bottle it says 50 mg twice a day and had PCP Dr not Dr.Hochrein.Advised sounds like refill sent in by PCP.Advised in patient's chart he is suppose to take metoprolol tart 25 mg twice a day.Advised to call PCP.Refill sent to pharmacy.

## 2014-03-16 ENCOUNTER — Encounter: Payer: Self-pay | Admitting: Podiatry

## 2014-03-16 ENCOUNTER — Ambulatory Visit (INDEPENDENT_AMBULATORY_CARE_PROVIDER_SITE_OTHER): Payer: Medicare Other | Admitting: Podiatry

## 2014-03-16 ENCOUNTER — Encounter (HOSPITAL_COMMUNITY)
Admission: RE | Admit: 2014-03-16 | Discharge: 2014-03-16 | Disposition: A | Discharge status: Home / Self Care | Payer: Medicare Other | Source: Ambulatory Visit | Attending: Cardiology | Admitting: Cardiology

## 2014-03-16 DIAGNOSIS — Z951 Presence of aortocoronary bypass graft: Secondary | ICD-10-CM | POA: Diagnosis not present

## 2014-03-16 DIAGNOSIS — B351 Tinea unguium: Secondary | ICD-10-CM

## 2014-03-16 DIAGNOSIS — M79676 Pain in unspecified toe(s): Secondary | ICD-10-CM

## 2014-03-17 ENCOUNTER — Encounter: Payer: Self-pay | Admitting: Cardiology

## 2014-03-17 NOTE — Progress Notes (Signed)
Patient ID: Edgar Thomas, male   DOB: 12/07/1929, 80 y.o.   MRN: [id number]  Subjective: This patient presents again today complaining of painful toenails and walking wearing shoes  Objective: Hypertrophic, elongated, incurvated, discolored toenails 6-10  Assessment: Symptomatic onychomycoses 6-10  Plan: Debrided toenails x10 without a bleeding  Reappoint x3 months

## 2014-03-18 ENCOUNTER — Encounter (HOSPITAL_COMMUNITY)
Admission: RE | Admit: 2014-03-18 | Discharge: 2014-03-18 | Disposition: A | Discharge status: Home / Self Care | Payer: Medicare Other | Source: Ambulatory Visit | Attending: Cardiology | Admitting: Cardiology

## 2014-03-18 DIAGNOSIS — Z951 Presence of aortocoronary bypass graft: Secondary | ICD-10-CM | POA: Diagnosis not present

## 2014-03-20 ENCOUNTER — Ambulatory Visit (INDEPENDENT_AMBULATORY_CARE_PROVIDER_SITE_OTHER): Payer: Medicare Other | Admitting: Pharmacist Clinician (PhC)/ Clinical Pharmacy Specialist

## 2014-03-20 ENCOUNTER — Encounter (HOSPITAL_COMMUNITY)
Admission: RE | Admit: 2014-03-20 | Discharge: 2014-03-20 | Disposition: A | Discharge status: Home / Self Care | Payer: Medicare Other | Source: Ambulatory Visit | Attending: Cardiology | Admitting: Cardiology

## 2014-03-20 DIAGNOSIS — I480 Paroxysmal atrial fibrillation: Secondary | ICD-10-CM

## 2014-03-20 DIAGNOSIS — Z5181 Encounter for therapeutic drug level monitoring: Secondary | ICD-10-CM

## 2014-03-20 DIAGNOSIS — Z951 Presence of aortocoronary bypass graft: Secondary | ICD-10-CM | POA: Diagnosis not present

## 2014-03-23 ENCOUNTER — Encounter (HOSPITAL_COMMUNITY)
Admission: RE | Admit: 2014-03-23 | Discharge: 2014-03-23 | Disposition: A | Discharge status: Home / Self Care | Payer: Medicare Other | Source: Ambulatory Visit | Attending: Cardiology | Admitting: Cardiology

## 2014-03-23 DIAGNOSIS — Z951 Presence of aortocoronary bypass graft: Secondary | ICD-10-CM | POA: Diagnosis not present

## 2014-03-25 ENCOUNTER — Encounter (HOSPITAL_COMMUNITY)
Admission: RE | Admit: 2014-03-25 | Discharge: 2014-03-25 | Disposition: A | Discharge status: Home / Self Care | Payer: Medicare Other | Source: Ambulatory Visit | Attending: Cardiology | Admitting: Cardiology

## 2014-03-25 DIAGNOSIS — Z951 Presence of aortocoronary bypass graft: Secondary | ICD-10-CM | POA: Diagnosis not present

## 2014-03-27 ENCOUNTER — Encounter (HOSPITAL_COMMUNITY)
Admission: RE | Admit: 2014-03-27 | Discharge: 2014-03-27 | Disposition: A | Discharge status: Home / Self Care | Payer: Medicare Other | Source: Ambulatory Visit | Attending: Cardiology | Admitting: Cardiology

## 2014-03-27 DIAGNOSIS — Z951 Presence of aortocoronary bypass graft: Secondary | ICD-10-CM | POA: Diagnosis not present

## 2014-03-30 ENCOUNTER — Encounter (HOSPITAL_COMMUNITY)
Admission: RE | Admit: 2014-03-30 | Discharge: 2014-03-30 | Disposition: A | Discharge status: Home / Self Care | Payer: Medicare Other | Source: Ambulatory Visit | Attending: Cardiology | Admitting: Cardiology

## 2014-03-30 DIAGNOSIS — Z951 Presence of aortocoronary bypass graft: Secondary | ICD-10-CM | POA: Diagnosis present

## 2014-04-01 ENCOUNTER — Encounter (HOSPITAL_COMMUNITY): Payer: Medicare Other

## 2014-04-01 DIAGNOSIS — Z951 Presence of aortocoronary bypass graft: Secondary | ICD-10-CM | POA: Diagnosis not present

## 2014-04-03 ENCOUNTER — Encounter (HOSPITAL_COMMUNITY): Payer: Medicare Other

## 2014-04-06 ENCOUNTER — Encounter (HOSPITAL_COMMUNITY)
Admission: RE | Admit: 2014-04-06 | Discharge: 2014-04-06 | Disposition: A | Discharge status: Home / Self Care | Payer: Medicare Other | Source: Ambulatory Visit | Attending: Cardiology | Admitting: Cardiology

## 2014-04-06 ENCOUNTER — Ambulatory Visit (INDEPENDENT_AMBULATORY_CARE_PROVIDER_SITE_OTHER): Payer: Medicare Other | Admitting: Pharmacist Clinician (PhC)/ Clinical Pharmacy Specialist

## 2014-04-06 DIAGNOSIS — Z951 Presence of aortocoronary bypass graft: Secondary | ICD-10-CM | POA: Diagnosis not present

## 2014-04-06 DIAGNOSIS — I480 Paroxysmal atrial fibrillation: Secondary | ICD-10-CM

## 2014-04-06 DIAGNOSIS — Z5181 Encounter for therapeutic drug level monitoring: Secondary | ICD-10-CM

## 2014-04-06 LAB — POCT INR: INR: 1.9

## 2014-04-08 ENCOUNTER — Encounter (HOSPITAL_COMMUNITY)
Admission: RE | Admit: 2014-04-08 | Discharge: 2014-04-08 | Disposition: A | Discharge status: Home / Self Care | Payer: Medicare Other | Source: Ambulatory Visit | Attending: Cardiology | Admitting: Cardiology

## 2014-04-08 DIAGNOSIS — Z951 Presence of aortocoronary bypass graft: Secondary | ICD-10-CM | POA: Diagnosis not present

## 2014-04-10 ENCOUNTER — Encounter (HOSPITAL_COMMUNITY): Payer: Medicare Other

## 2014-04-13 ENCOUNTER — Encounter (HOSPITAL_COMMUNITY)
Admission: RE | Admit: 2014-04-13 | Discharge: 2014-04-13 | Disposition: A | Discharge status: Home / Self Care | Payer: Medicare Other | Source: Ambulatory Visit | Attending: Cardiology | Admitting: Cardiology

## 2014-04-13 DIAGNOSIS — Z951 Presence of aortocoronary bypass graft: Secondary | ICD-10-CM | POA: Diagnosis not present

## 2014-04-15 ENCOUNTER — Encounter (HOSPITAL_COMMUNITY): Payer: Medicare Other

## 2014-04-17 ENCOUNTER — Encounter (HOSPITAL_COMMUNITY)
Admission: RE | Admit: 2014-04-17 | Discharge: 2014-04-17 | Disposition: A | Discharge status: Home / Self Care | Payer: Medicare Other | Source: Ambulatory Visit | Attending: Cardiology | Admitting: Cardiology

## 2014-04-17 DIAGNOSIS — Z951 Presence of aortocoronary bypass graft: Secondary | ICD-10-CM | POA: Diagnosis not present

## 2014-04-20 ENCOUNTER — Encounter (HOSPITAL_COMMUNITY)
Admission: RE | Admit: 2014-04-20 | Discharge: 2014-04-20 | Disposition: A | Discharge status: Home / Self Care | Payer: Medicare Other | Source: Ambulatory Visit | Attending: Cardiology | Admitting: Cardiology

## 2014-04-20 DIAGNOSIS — Z951 Presence of aortocoronary bypass graft: Secondary | ICD-10-CM | POA: Diagnosis not present

## 2014-04-20 NOTE — Progress Notes (Signed)
[employer] graduates today he plans to continue exercise on his own. Edgar Thomas is interested in participating in the maintenance program in the future. PHQ goal =0. Edgar Thomas says his long and short term goals were met.

## 2014-04-22 ENCOUNTER — Encounter (HOSPITAL_COMMUNITY): Payer: Medicare Other

## 2014-04-24 ENCOUNTER — Encounter (HOSPITAL_COMMUNITY): Payer: Medicare Other

## 2014-04-27 ENCOUNTER — Ambulatory Visit (INDEPENDENT_AMBULATORY_CARE_PROVIDER_SITE_OTHER): Payer: Medicare Other | Admitting: Pharmacist Clinician (PhC)/ Clinical Pharmacy Specialist

## 2014-04-27 DIAGNOSIS — Z5181 Encounter for therapeutic drug level monitoring: Secondary | ICD-10-CM

## 2014-04-27 DIAGNOSIS — I480 Paroxysmal atrial fibrillation: Secondary | ICD-10-CM

## 2014-04-27 LAB — POCT INR: INR: 1.8

## 2014-06-02 ENCOUNTER — Telehealth: Payer: Self-pay | Admitting: Cardiology

## 2014-06-02 NOTE — Telephone Encounter (Signed)
New message        Pt states he has increased his time on the treadmill and his bp has been between 100-120 for top number and between 50-60 on the bottom number and has cut his crestor in half   pt states he wanted to let you know that he was doing well

## 2014-06-02 NOTE — Telephone Encounter (Signed)
Sounds great!

## 2014-06-15 ENCOUNTER — Ambulatory Visit (INDEPENDENT_AMBULATORY_CARE_PROVIDER_SITE_OTHER): Payer: Medicare Other | Admitting: Podiatry

## 2014-06-15 ENCOUNTER — Encounter: Payer: Self-pay | Admitting: Podiatry

## 2014-06-15 ENCOUNTER — Ambulatory Visit: Payer: Medicare Other | Admitting: Podiatry

## 2014-06-15 DIAGNOSIS — M79676 Pain in unspecified toe(s): Secondary | ICD-10-CM

## 2014-06-15 DIAGNOSIS — B351 Tinea unguium: Secondary | ICD-10-CM

## 2014-06-16 NOTE — Progress Notes (Signed)
Patient ID: Edgar ChurchBilly Ergle, male   DOB: 07/03/1929, 81 y.o.   MRN: [id number]  Subjective: This patient presents for ongoing treatment for painful toenails when walking wearing shoes  Objective: The toenails are elongated, incurvated, hypertrophic, discolored and tender to palpation 6-10  Assessment: Symptomatic onychomycosis 6-10  Plan: Debridement of toenails 10 without a bleeding  Reappoint 3 months

## 2014-06-23 ENCOUNTER — Ambulatory Visit: Payer: Self-pay | Admitting: Cardiology

## 2014-07-14 ENCOUNTER — Encounter: Payer: Self-pay | Admitting: Cardiology

## 2014-09-21 ENCOUNTER — Encounter: Payer: Self-pay | Admitting: Podiatry

## 2014-09-21 ENCOUNTER — Ambulatory Visit (INDEPENDENT_AMBULATORY_CARE_PROVIDER_SITE_OTHER): Payer: Medicare Other | Admitting: Podiatry

## 2014-09-21 DIAGNOSIS — B351 Tinea unguium: Secondary | ICD-10-CM

## 2014-09-21 DIAGNOSIS — M79676 Pain in unspecified toe(s): Secondary | ICD-10-CM | POA: Diagnosis not present

## 2014-09-22 NOTE — Progress Notes (Signed)
Patient ID: Edgar ChurchBilly Aylesworth, male   DOB: 04/21/1930, 81 y.o.   MRN: [id number]  Subjective: This patient presents complaining of painful toenails and requests toenail debridement  Objective: The toenails are incurvated, elongated, discolored, hypertrophic, discolored and tender to direct palpation 6-10  Assessment: Symptomatic onychomycoses 6-10  Plan: Debridement toenails 10 without any bleeding  Reappoint 3 months

## 2014-12-21 ENCOUNTER — Ambulatory Visit: Payer: Medicare Other | Admitting: Podiatry

## 2014-12-22 ENCOUNTER — Ambulatory Visit: Payer: Self-pay | Admitting: Pharmacist Clinician (PhC)/ Clinical Pharmacy Specialist

## 2014-12-22 DIAGNOSIS — I480 Paroxysmal atrial fibrillation: Secondary | ICD-10-CM

## 2014-12-22 DIAGNOSIS — Z5181 Encounter for therapeutic drug level monitoring: Secondary | ICD-10-CM

## 2014-12-23 ENCOUNTER — Encounter: Payer: Self-pay | Admitting: Podiatry

## 2014-12-23 ENCOUNTER — Ambulatory Visit (INDEPENDENT_AMBULATORY_CARE_PROVIDER_SITE_OTHER): Payer: Medicare Other | Admitting: Podiatry

## 2014-12-23 DIAGNOSIS — M79676 Pain in unspecified toe(s): Secondary | ICD-10-CM | POA: Diagnosis not present

## 2014-12-23 DIAGNOSIS — B351 Tinea unguium: Secondary | ICD-10-CM | POA: Diagnosis not present

## 2014-12-24 NOTE — Progress Notes (Signed)
Patient ID: Edgar Thomas, male   DOB: 12-20-1929, 81 y.o.   MRN: [id number]  Subjective: This patient presents today complaining of painful toenails and requests toenail debridement  Objective: Orientated 3 The toenails are discolored, hypertrophic, elongated, incurvated and tender to direct palpation 6-10  Assessment: Symptomatic onychomycoses 6-10  Plan: Debridement toenails 10 without any bleeding  Reappoint 3 months

## 2014-12-25 ENCOUNTER — Encounter: Payer: Self-pay | Admitting: Cardiology

## 2015-03-03 ENCOUNTER — Ambulatory Visit (INDEPENDENT_AMBULATORY_CARE_PROVIDER_SITE_OTHER): Payer: Medicare Other | Admitting: Podiatry

## 2015-03-03 ENCOUNTER — Encounter: Payer: Self-pay | Admitting: Podiatry

## 2015-03-03 DIAGNOSIS — M79676 Pain in unspecified toe(s): Secondary | ICD-10-CM

## 2015-03-03 DIAGNOSIS — B351 Tinea unguium: Secondary | ICD-10-CM | POA: Diagnosis not present

## 2015-03-03 NOTE — Progress Notes (Signed)
Patient ID: Edgar Thomas, male   DOB: Aug 20, 1929, 81 y.o.   MRN: [id number]  Subjective: This patient presents for a scheduled visits complaining of painful toenails and requesting nail debridement  Objective: No open skin lesions bilaterally The toenails are elongated, brittle, incurvated, discolored and tender to direct palpation 6-10  Assessment: Symptomatic onychomycoses 6-10  Plan: Debridement toenails 10 mechanically and electrically without any bleeding  Reappoint 3 months

## 2015-05-19 ENCOUNTER — Ambulatory Visit (INDEPENDENT_AMBULATORY_CARE_PROVIDER_SITE_OTHER): Payer: Medicare Other | Admitting: Podiatry

## 2015-05-19 ENCOUNTER — Encounter: Payer: Self-pay | Admitting: Podiatry

## 2015-05-19 DIAGNOSIS — B351 Tinea unguium: Secondary | ICD-10-CM | POA: Diagnosis not present

## 2015-05-19 DIAGNOSIS — M79676 Pain in unspecified toe(s): Secondary | ICD-10-CM

## 2015-05-20 NOTE — Progress Notes (Signed)
Patient ID: Edgar ChurchBilly Guidone, male   DOB: 02/28/1930, 81 y.o.   MRN: [id number]  Subjective: This patient presents for a scheduled visit complaining of elongated uncomfortable toenails when walking wearing shoes and is requesting nail debridement  Objective: Pleasant orientated 3 No open skin lesions bilaterally The toenails are incurvated, elongated, discolored, deformed and tender direct palpation 6-10  Assessment: Symptomatic onychomycoses 6-10  Plan: Debridement toenails 6-10 mechanically and electrically without any bleeding  Reappoint 3 month

## 2015-06-11 IMAGING — CR DG CHEST 2V
2 series · 2 of 2 positions shown · non-contrast
Comparison: None.

CLINICAL DATA: Pre Cardiac Surgery

EXAM:
CHEST  2 VIEW

[w chest pa]
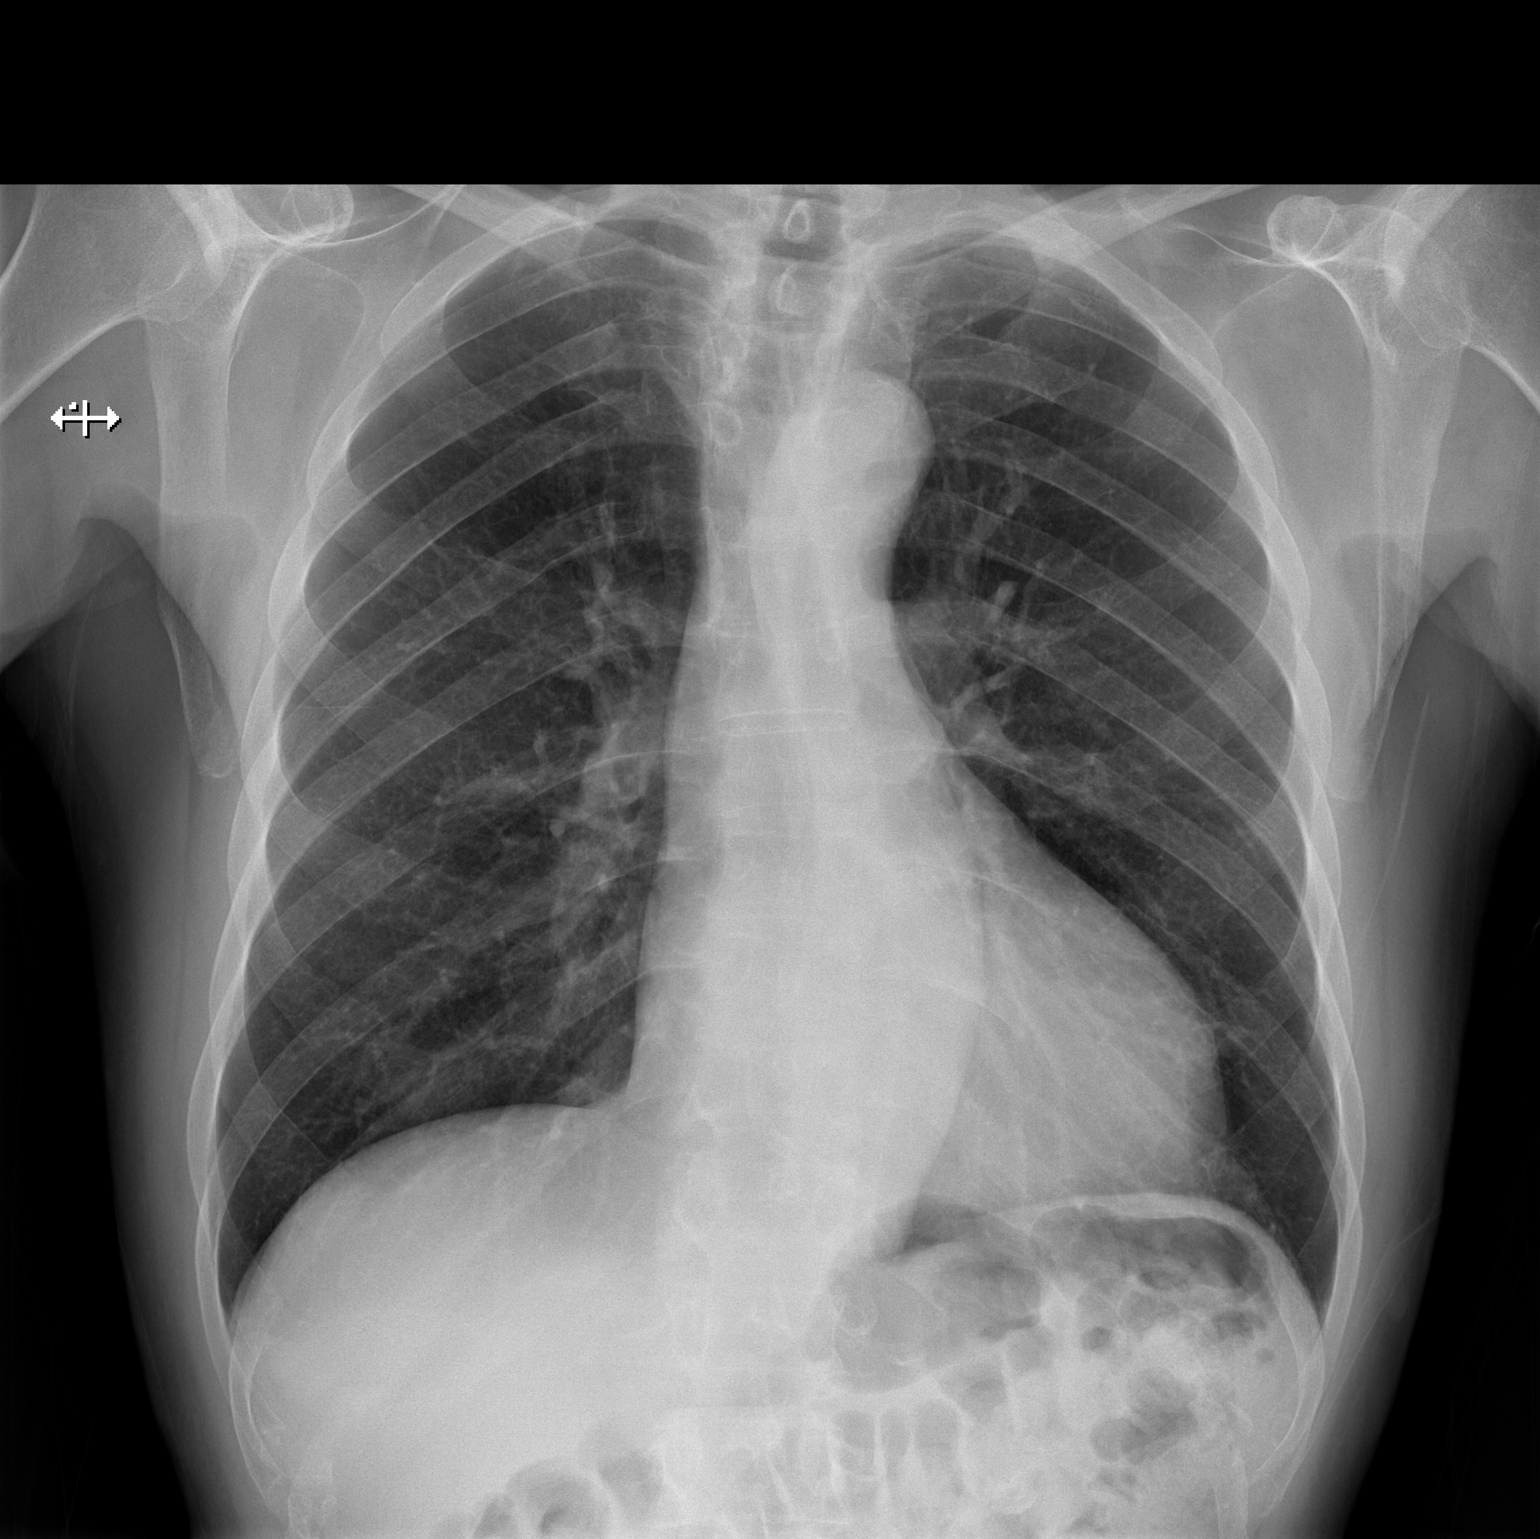

[w chest lat]
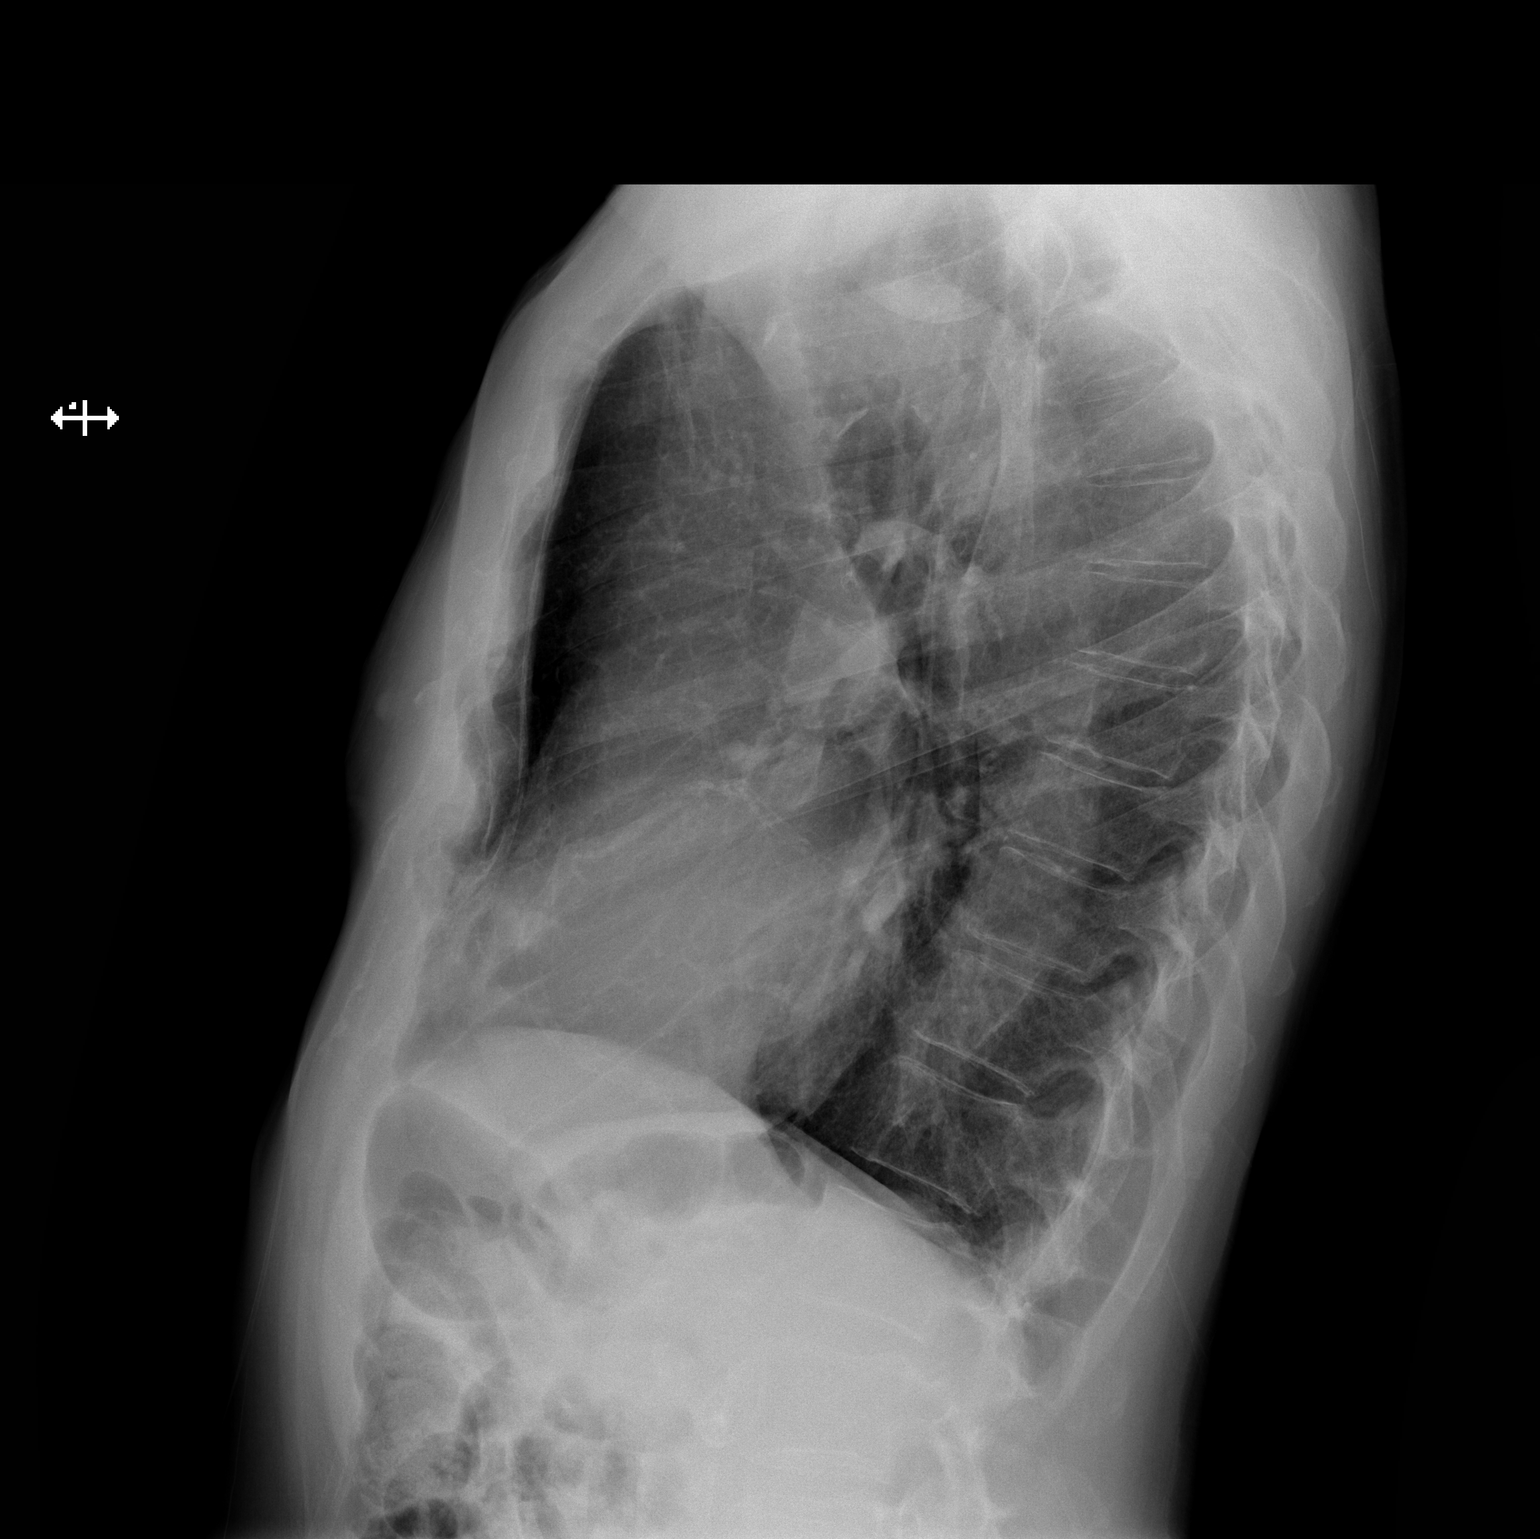

[2 of 2 positions shown; findings below may reference images not displayed]

FINDINGS: The heart size and mediastinal contours are within normal limits.
Aorta is tortuous. Both lungs are clear. The visualized skeletal
structures are unremarkable.
IMPRESSION: No active cardiopulmonary disease.

## 2015-06-15 IMAGING — CR DG CHEST 1V PORT
1 series · 1 of 1 positions shown · non-contrast
Comparison: Yesterday

CLINICAL DATA: post op check chest tube

EXAM:
PORTABLE CHEST - 1 VIEW

[AP]
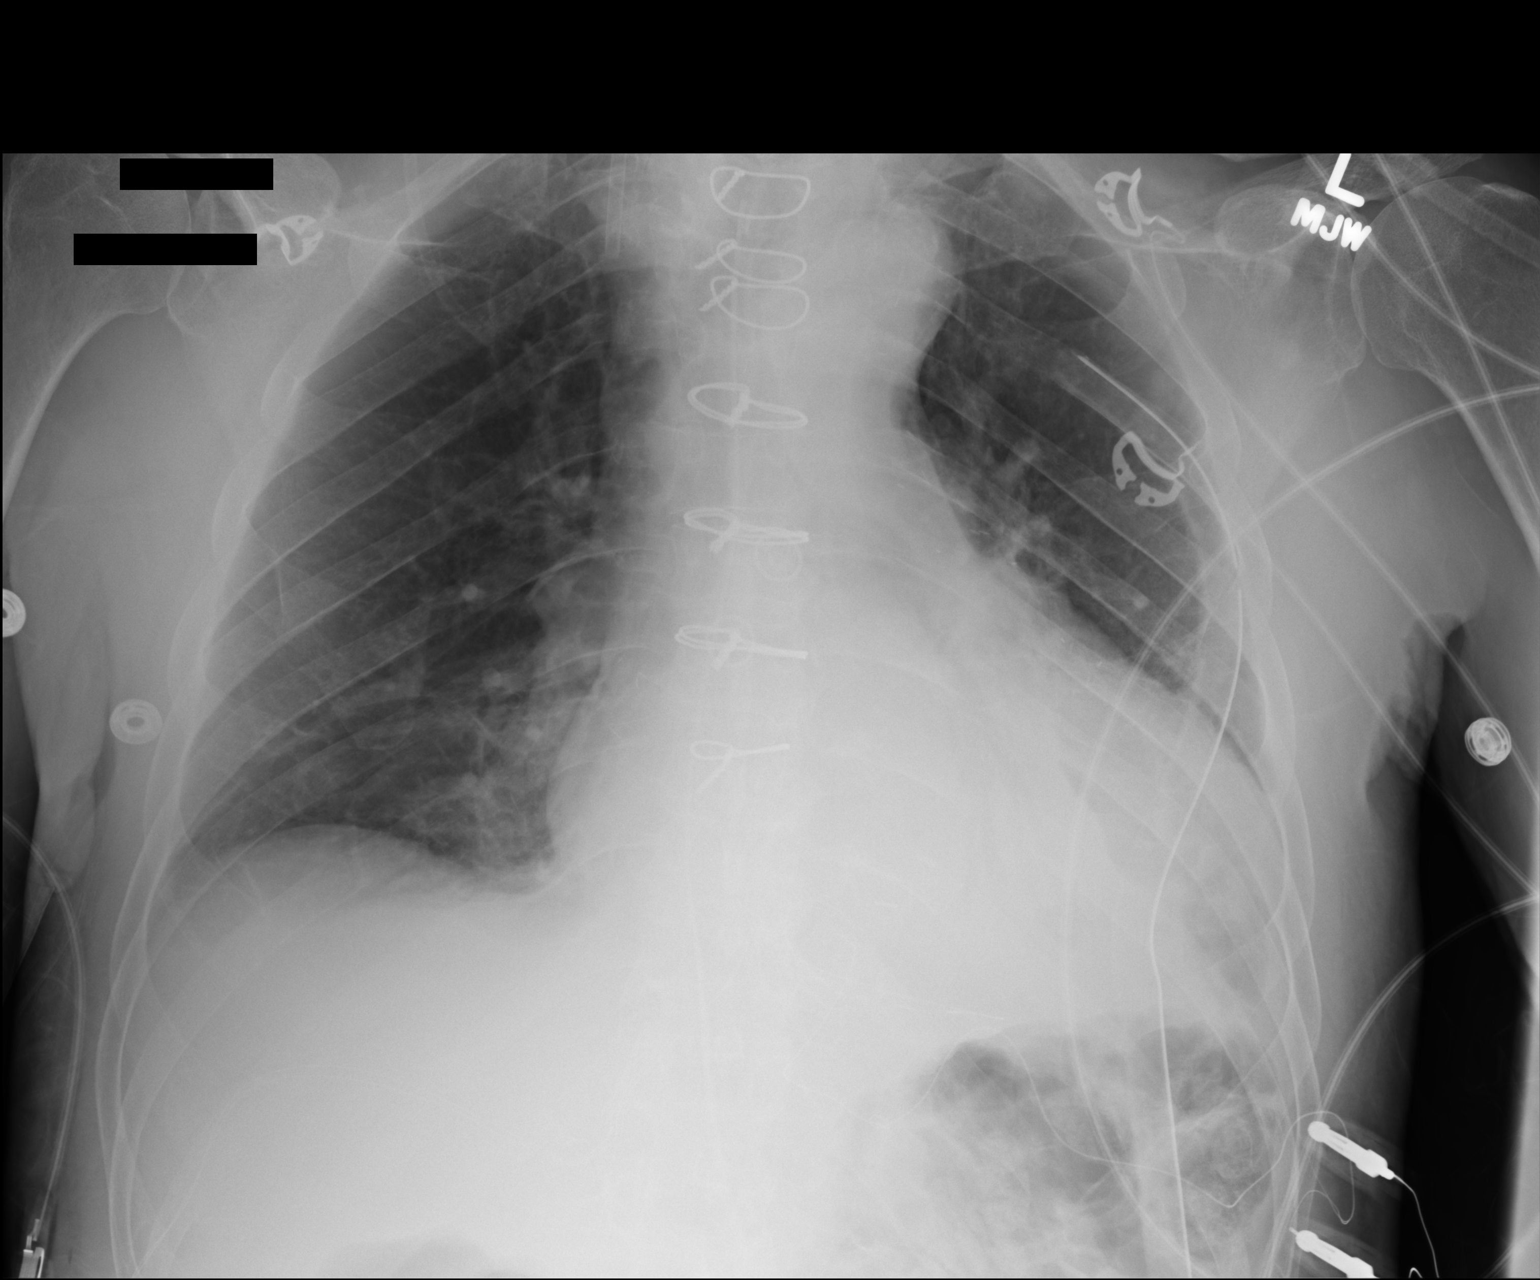

[1 of 1 positions shown; findings below may reference images not displayed]

FINDINGS: Swan-Ganz catheter removed with the right internal jugular
introducer venous sheath and placed. Chest and mediastinal tubes
stable. No pneumothorax. No CHF. Low volumes with basilar
atelectasis left greater than right is worse. Vascular congestion
has resolved.
IMPRESSION: Resolved vascular congestion.

No pneumothorax or CHF.

Bibasilar atelectasis worse.

## 2015-07-28 ENCOUNTER — Ambulatory Visit (INDEPENDENT_AMBULATORY_CARE_PROVIDER_SITE_OTHER): Payer: Medicare Other | Admitting: Podiatry

## 2015-07-28 ENCOUNTER — Encounter: Payer: Self-pay | Admitting: Podiatry

## 2015-07-28 DIAGNOSIS — B351 Tinea unguium: Secondary | ICD-10-CM | POA: Diagnosis not present

## 2015-07-28 DIAGNOSIS — M79676 Pain in unspecified toe(s): Secondary | ICD-10-CM

## 2015-07-29 NOTE — Progress Notes (Signed)
Patient ID: Edgar Thomas, male   DOB: 10/02/1929, 82 y.o.   MRN: [id number]  Subjective: This patient presents for a scheduled visit complaining of elongated uncomfortable toenails when walking wearing shoes and is requesting nail debridement  Objective: Pleasant orientated 3 No open skin lesions bilaterally The toenails are incurvated, elongated, discolored, deformed and tender direct palpation 6-10  Assessment: Symptomatic onychomycoses 6-10  Plan: Debridement toenails 6-10 mechanically and electrically without any bleeding  Reappoint 3 month 

## 2015-08-09 DIAGNOSIS — J0111 Acute recurrent frontal sinusitis: Secondary | ICD-10-CM | POA: Diagnosis not present

## 2015-08-24 DIAGNOSIS — I480 Paroxysmal atrial fibrillation: Secondary | ICD-10-CM | POA: Diagnosis not present

## 2015-08-24 DIAGNOSIS — I517 Cardiomegaly: Secondary | ICD-10-CM | POA: Diagnosis not present

## 2015-08-24 DIAGNOSIS — I509 Heart failure, unspecified: Secondary | ICD-10-CM | POA: Diagnosis not present

## 2015-08-24 DIAGNOSIS — I4891 Unspecified atrial fibrillation: Secondary | ICD-10-CM | POA: Diagnosis not present

## 2015-08-25 DIAGNOSIS — I10 Essential (primary) hypertension: Secondary | ICD-10-CM | POA: Diagnosis not present

## 2015-08-25 DIAGNOSIS — E785 Hyperlipidemia, unspecified: Secondary | ICD-10-CM | POA: Diagnosis not present

## 2015-08-25 DIAGNOSIS — R9431 Abnormal electrocardiogram [ECG] [EKG]: Secondary | ICD-10-CM | POA: Diagnosis not present

## 2015-08-25 DIAGNOSIS — I444 Left anterior fascicular block: Secondary | ICD-10-CM | POA: Diagnosis not present

## 2015-08-25 DIAGNOSIS — I509 Heart failure, unspecified: Secondary | ICD-10-CM | POA: Diagnosis not present

## 2015-08-25 DIAGNOSIS — I4891 Unspecified atrial fibrillation: Secondary | ICD-10-CM | POA: Diagnosis not present

## 2015-08-25 DIAGNOSIS — I252 Old myocardial infarction: Secondary | ICD-10-CM | POA: Diagnosis not present

## 2015-08-25 DIAGNOSIS — Z951 Presence of aortocoronary bypass graft: Secondary | ICD-10-CM | POA: Diagnosis not present

## 2015-08-25 DIAGNOSIS — Z7982 Long term (current) use of aspirin: Secondary | ICD-10-CM | POA: Diagnosis not present

## 2015-08-25 DIAGNOSIS — I2510 Atherosclerotic heart disease of native coronary artery without angina pectoris: Secondary | ICD-10-CM | POA: Diagnosis not present

## 2015-08-25 DIAGNOSIS — I480 Paroxysmal atrial fibrillation: Secondary | ICD-10-CM | POA: Diagnosis not present

## 2015-08-25 DIAGNOSIS — I517 Cardiomegaly: Secondary | ICD-10-CM | POA: Diagnosis not present

## 2015-08-25 DIAGNOSIS — R001 Bradycardia, unspecified: Secondary | ICD-10-CM | POA: Diagnosis not present

## 2015-08-25 DIAGNOSIS — I491 Atrial premature depolarization: Secondary | ICD-10-CM | POA: Diagnosis not present

## 2015-08-25 DIAGNOSIS — I493 Ventricular premature depolarization: Secondary | ICD-10-CM | POA: Diagnosis not present

## 2015-08-28 ENCOUNTER — Inpatient Hospital Stay (HOSPITAL_COMMUNITY)
Admission: EM | Admit: 2015-08-28 | Discharge: 2015-08-30 | DRG: 309 | Disposition: A | Discharge status: Home / Self Care | Payer: Medicare Other | Attending: Internal Medicine | Admitting: Internal Medicine

## 2015-08-28 ENCOUNTER — Emergency Department (HOSPITAL_COMMUNITY): Payer: Medicare Other

## 2015-08-28 DIAGNOSIS — J209 Acute bronchitis, unspecified: Secondary | ICD-10-CM | POA: Diagnosis present

## 2015-08-28 DIAGNOSIS — I255 Ischemic cardiomyopathy: Secondary | ICD-10-CM | POA: Diagnosis not present

## 2015-08-28 DIAGNOSIS — Z951 Presence of aortocoronary bypass graft: Secondary | ICD-10-CM

## 2015-08-28 DIAGNOSIS — J440 Chronic obstructive pulmonary disease with acute lower respiratory infection: Secondary | ICD-10-CM | POA: Diagnosis not present

## 2015-08-28 DIAGNOSIS — Z7901 Long term (current) use of anticoagulants: Secondary | ICD-10-CM

## 2015-08-28 DIAGNOSIS — J40 Bronchitis, not specified as acute or chronic: Secondary | ICD-10-CM | POA: Diagnosis present

## 2015-08-28 DIAGNOSIS — I2510 Atherosclerotic heart disease of native coronary artery without angina pectoris: Secondary | ICD-10-CM | POA: Diagnosis not present

## 2015-08-28 DIAGNOSIS — Z9861 Coronary angioplasty status: Secondary | ICD-10-CM

## 2015-08-28 DIAGNOSIS — E785 Hyperlipidemia, unspecified: Secondary | ICD-10-CM | POA: Diagnosis not present

## 2015-08-28 DIAGNOSIS — I4891 Unspecified atrial fibrillation: Secondary | ICD-10-CM | POA: Diagnosis not present

## 2015-08-28 DIAGNOSIS — I252 Old myocardial infarction: Secondary | ICD-10-CM | POA: Diagnosis not present

## 2015-08-28 DIAGNOSIS — Z8249 Family history of ischemic heart disease and other diseases of the circulatory system: Secondary | ICD-10-CM

## 2015-08-28 DIAGNOSIS — I480 Paroxysmal atrial fibrillation: Principal | ICD-10-CM | POA: Diagnosis present

## 2015-08-28 DIAGNOSIS — K5900 Constipation, unspecified: Secondary | ICD-10-CM | POA: Diagnosis present

## 2015-08-28 DIAGNOSIS — I10 Essential (primary) hypertension: Secondary | ICD-10-CM | POA: Diagnosis present

## 2015-08-28 DIAGNOSIS — R05 Cough: Secondary | ICD-10-CM | POA: Diagnosis not present

## 2015-08-28 DIAGNOSIS — Z79899 Other long term (current) drug therapy: Secondary | ICD-10-CM

## 2015-08-28 LAB — I-STAT TROPONIN, ED: TROPONIN I, POC: 0.03 ng/mL (ref 0.00–0.08)

## 2015-08-28 LAB — I-STAT CHEM 8, ED
BUN: 22 mg/dL — AB (ref 6–20)
CHLORIDE: 104 mmol/L (ref 101–111)
Calcium, Ion: 1.14 mmol/L (ref 1.13–1.30)
Creatinine, Ser: 0.8 mg/dL (ref 0.61–1.24)
Glucose, Bld: 110 mg/dL — ABNORMAL HIGH (ref 65–99)
HEMATOCRIT: 44 % (ref 39.0–52.0)
Hemoglobin: 15 g/dL (ref 13.0–17.0)
Potassium: 4.6 mmol/L (ref 3.5–5.1)
SODIUM: 140 mmol/L (ref 135–145)
TCO2: 22 mmol/L (ref 0–100)

## 2015-08-28 LAB — CBC
HEMATOCRIT: 40.9 % (ref 39.0–52.0)
HEMOGLOBIN: 13.6 g/dL (ref 13.0–17.0)
MCH: 31.1 pg (ref 26.0–34.0)
MCHC: 33.3 g/dL (ref 30.0–36.0)
MCV: 93.4 fL (ref 78.0–100.0)
Platelets: 153 10*3/uL (ref 150–400)
RBC: 4.38 MIL/uL (ref 4.22–5.81)
RDW: 15.3 % (ref 11.5–15.5)
WBC: 6 10*3/uL (ref 4.0–10.5)

## 2015-08-28 LAB — BASIC METABOLIC PANEL
ANION GAP: 8 (ref 5–15)
BUN: 19 mg/dL (ref 6–20)
CALCIUM: 9.1 mg/dL (ref 8.9–10.3)
CO2: 23 mmol/L (ref 22–32)
Chloride: 106 mmol/L (ref 101–111)
Creatinine, Ser: 0.81 mg/dL (ref 0.61–1.24)
GFR calc Af Amer: >60 mL/min (ref >60)
Glucose, Bld: 110 mg/dL — ABNORMAL HIGH (ref 65–99)
POTASSIUM: 4.7 mmol/L (ref 3.5–5.1)
SODIUM: 137 mmol/L (ref 135–145)

## 2015-08-28 LAB — PROTIME-INR
INR: 1.35 (ref 0.00–1.49)
PROTHROMBIN TIME: 16.8 s — AB (ref 11.6–15.2)

## 2015-08-28 MED ORDER — DILTIAZEM HCL 25 MG/5ML IV SOLN
10.0000 mg | Freq: Once | INTRAVENOUS | Status: AC
  Administered 2015-08-28: 10 mg via INTRAVENOUS
  Filled 2015-08-28: qty 5

## 2015-08-28 MED ORDER — DILTIAZEM HCL 100 MG IV SOLR
5.0000 mg/h | Freq: Once | INTRAVENOUS | Status: AC
  Administered 2015-08-28: 5 mg/h via INTRAVENOUS
  Filled 2015-08-28: qty 100

## 2015-08-28 MED ORDER — SODIUM CHLORIDE 0.9 % IV BOLUS (SEPSIS)
500.0000 mL | Freq: Once | INTRAVENOUS | Status: AC
  Administered 2015-08-28: 500 mL via INTRAVENOUS

## 2015-08-28 MED ORDER — LEVOFLOXACIN IN D5W 500 MG/100ML IV SOLN
500.0000 mg | Freq: Once | INTRAVENOUS | Status: DC
  Filled 2015-08-28: qty 100

## 2015-08-28 NOTE — ED Provider Notes (Signed)
CSN: [id number]     Arrival date & time 08/28/15  2110 History   First MD Initiated Contact with Patient 08/28/15 2136     Chief Complaint  Patient presents with  . Cough     (Consider location/radiation/quality/duration/timing/severity/associated sxs/prior Treatment) HPI  This is an 82 year old man with a history of coronary artery disease status post bypass graft, hyperlipidemia, A. fib and hypertension who presents today complaining of cough. He states that he was seen on Tuesday in [place] for atrial fibrillation and was cardioverted there. He was started on eliquis. He has felt like he has had an increasing cough for the past 2 days. It is nonproductive. He states that today it is deeper than it was. He denies fever, chills, or dyspnea. He denies any chest pain, nausea, or vomiting. He has not noted any lightheadedness or weakness.  Past Medical History  Diagnosis Date  . Hyperlipemia   . CAD (coronary artery disease)   . Heartburn   . A-fib (HCC)   . Hypertension   . Heart attack (HCC)   . Constipation   . GERD (gastroesophageal reflux disease)    Past Surgical History  Procedure Laterality Date  . Balloon angioplasty of lad    . Colonoscopy    . Coronary artery bypass graft N/A 12/03/2013    Procedure: CORONARY ARTERY BYPASS GRAFTING (CABG) x4 using left internal mammary artery and right greater saphenous vein. LIMA to LAD, sequential SVG to OM 1 & OM 2, SVG to PD;  Surgeon: Delight OvensEdward B Gerhardt, MD;  [facility];  Service: Open Heart Surgery;  Laterality: N/A;  . Intraoperative transesophageal echocardiogram N/A 12/03/2013    Procedure: INTRAOPERATIVE TRANSESOPHAGEAL ECHOCARDIOGRAM;  Surgeon: Delight OvensEdward B Gerhardt, MD;  [facility];  Service: Open Heart Surgery;  Laterality: N/A;  . Endovein harvest of greater saphenous vein Right 12/03/2013    Procedure: ENDOVEIN HARVEST OF GREATER SAPHENOUS VEIN;  Surgeon: Delight OvensEdward B Gerhardt, MD;  [facility];  Service: Open Heart Surgery;   Laterality: Right;   Family History  Problem Relation Age of Onset  . Heart disease Mother     died at age 80 of heart attack   Social History  Substance Use Topics  . Smoking status: Never Smoker   . Smokeless tobacco: Never Used  . Alcohol Use: No    Review of Systems  All other systems reviewed and are negative.     Allergies  Contrast media  Home Medications   Prior to Admission medications   Medication Sig Start Date End Date Taking? Authorizing Provider  aspirin EC 81 MG EC tablet Take 1 tablet (81 mg total) by mouth daily. 12/11/13   Erin R Barrett, PA-C  CRESTOR 20 MG tablet Take 20 mg by mouth daily. 08/26/14   Historical Provider, MD  Multiple Vitamin (MULTIVITAMIN) capsule Take 1 capsule by mouth daily.    Historical Provider, MD  niacin (NIASPAN) 1000 MG CR tablet Take 1,000 mg by mouth every morning.     Historical Provider, MD  Omega 3 1000 MG CAPS Take 1,000 mg by mouth daily.     Historical Provider, MD  Vitamin D, Cholecalciferol, 1000 UNITS TABS Take 1,000 Units by mouth daily.     Historical Provider, MD   BP 163/137 mmHg  Pulse 48  Temp(Src) 97.2 F (36.2 C) (Oral)  Resp 16  Ht 5\' 10"  (1.778 m)  Wt 73.681 kg  BMI 23.31 kg/m2  SpO2 95% Physical Exam  Constitutional: He is oriented  to person, place, and time. He appears well-developed and well-nourished.  HENT:  Head: Normocephalic and atraumatic.  Right Ear: External ear normal.  Left Ear: External ear normal.  Nose: Nose normal.  Mouth/Throat: Oropharynx is clear and moist.  Eyes: Conjunctivae and EOM are normal. Pupils are equal, round, and reactive to light.  Neck: Normal range of motion. Neck supple.  Cardiovascular: Normal heart sounds and intact distal pulses.  An irregularly irregular rhythm present. Tachycardia present.   Pulmonary/Chest: Effort normal and breath sounds normal. No respiratory distress. He has no wheezes. He exhibits no tenderness.  Abdominal: Soft. Bowel sounds are  normal. He exhibits no distension and no mass. There is no tenderness. There is no guarding.  Musculoskeletal: Normal range of motion.  Neurological: He is alert and oriented to person, place, and time. He has normal reflexes. He exhibits normal muscle tone. Coordination normal.  Skin: Skin is warm and dry.  Psychiatric: He has a normal mood and affect. His behavior is normal. Judgment and thought content normal.  Nursing note and vitals reviewed.   ED Course  Procedures (including critical care time) Labs Review Labs Reviewed  I-STAT CHEM 8, ED - Abnormal; Notable for the following:    BUN 22 (*)    Glucose, Bld 110 (*)    All other components within normal limits  CBC  PROTIME-INR  I-STAT TROPOININ, ED    Imaging Review Dg Chest 2 View  08/28/2015  CLINICAL DATA:  Cough, atrial fibrillation for 2 days. History of hypertension, atrial fibrillation, myocardial infarction. EXAM: CHEST  2 VIEW COMPARISON:  Chest x-rays dated 01/08/2014 and 12/07/2013. FINDINGS: Cardiomegaly is stable. Overall cardiomediastinal silhouette is stable in size and configuration. Median sternotomy wires appear intact and stable in alignment. Lungs are hyperexpanded indicating COPD. Suspect associated chronic bronchitic changes centrally. Lungs are otherwise clear. No evidence of pneumonia. No pleural effusion or pneumothorax seen. No acute- appearing osseous abnormality. IMPRESSION: 1. Hyperexpanded lungs indicating COPD/emphysema. Suspect associated chronic bronchitic changes centrally. 2. No acute findings. No evidence of pneumonia. No evidence of active congestive heart failure. 3. Cardiomegaly is stable. Electronically Signed   By: Bary Richard M.D.   On: 08/28/2015 23:05   I have personally reviewed and evaluated these images and lab results as part of my medical decision-making.   EKG Interpretation   Date/Time:  Saturday August 28 2015 21:22:23 EDT Ventricular Rate:  127 PR Interval:    QRS Duration:  120 QT Interval:  322 QTC Calculation: 467 R Axis:   -73 Text Interpretation:  Atrial fibrillation with rapid ventricular response  Left anterior fascicular block Left ventricular hypertrophy with QRS  widening Cannot rule out Septal infarct , age undetermined Abnormal ECG  Confirmed by Turhan Chill MD, Duwayne Heck ([id number]) on 08/28/2015 10:26:51 PM      MDM   Final diagnoses:  Atrial fibrillation with RVR (HCC)  Bronchitis    1 A. fib RVR patient on eliquis. He is not on any rate control medicines. He was started on Cardizem bolus and drip. His rate controlled at approximately 100. 2 patient with URI symptoms. No evidence of acute infiltrate on chest x-Kensli Bowley. He is given Levaquin here. He is not having any respiratory distress. Patient treated with levaquin here.   Discuss with Dr. Maryfrances Bunnell and he will see and admit.   Margarita Grizzle, MD 08/28/15 [phone]

## 2015-08-28 NOTE — ED Notes (Signed)
To ED for eval of coughing since Tuesday. Seen in [place] for same on Tuesday. Dx with bronchitis but not given meds. Denies sob or cp. States he was cardioverted out of Afib also on Tuesday.

## 2015-08-29 ENCOUNTER — Encounter (HOSPITAL_COMMUNITY): Payer: Self-pay | Admitting: *Deleted

## 2015-08-29 DIAGNOSIS — K5900 Constipation, unspecified: Secondary | ICD-10-CM | POA: Diagnosis present

## 2015-08-29 DIAGNOSIS — I2510 Atherosclerotic heart disease of native coronary artery without angina pectoris: Secondary | ICD-10-CM | POA: Diagnosis not present

## 2015-08-29 DIAGNOSIS — I10 Essential (primary) hypertension: Secondary | ICD-10-CM | POA: Diagnosis present

## 2015-08-29 DIAGNOSIS — J40 Bronchitis, not specified as acute or chronic: Secondary | ICD-10-CM | POA: Diagnosis present

## 2015-08-29 DIAGNOSIS — Z9861 Coronary angioplasty status: Secondary | ICD-10-CM | POA: Diagnosis not present

## 2015-08-29 DIAGNOSIS — I480 Paroxysmal atrial fibrillation: Secondary | ICD-10-CM | POA: Diagnosis present

## 2015-08-29 DIAGNOSIS — J440 Chronic obstructive pulmonary disease with acute lower respiratory infection: Secondary | ICD-10-CM | POA: Diagnosis present

## 2015-08-29 DIAGNOSIS — I255 Ischemic cardiomyopathy: Secondary | ICD-10-CM

## 2015-08-29 DIAGNOSIS — Z8249 Family history of ischemic heart disease and other diseases of the circulatory system: Secondary | ICD-10-CM | POA: Diagnosis not present

## 2015-08-29 DIAGNOSIS — I252 Old myocardial infarction: Secondary | ICD-10-CM | POA: Diagnosis not present

## 2015-08-29 DIAGNOSIS — E785 Hyperlipidemia, unspecified: Secondary | ICD-10-CM | POA: Diagnosis present

## 2015-08-29 DIAGNOSIS — Z951 Presence of aortocoronary bypass graft: Secondary | ICD-10-CM | POA: Diagnosis not present

## 2015-08-29 DIAGNOSIS — R05 Cough: Secondary | ICD-10-CM | POA: Diagnosis present

## 2015-08-29 DIAGNOSIS — Z79899 Other long term (current) drug therapy: Secondary | ICD-10-CM | POA: Diagnosis not present

## 2015-08-29 DIAGNOSIS — I4891 Unspecified atrial fibrillation: Secondary | ICD-10-CM | POA: Diagnosis not present

## 2015-08-29 DIAGNOSIS — Z7901 Long term (current) use of anticoagulants: Secondary | ICD-10-CM | POA: Diagnosis not present

## 2015-08-29 DIAGNOSIS — J209 Acute bronchitis, unspecified: Secondary | ICD-10-CM | POA: Diagnosis present

## 2015-08-29 LAB — BASIC METABOLIC PANEL
Anion gap: 8 (ref 5–15)
BUN: 15 mg/dL (ref 6–20)
CALCIUM: 8.5 mg/dL — AB (ref 8.9–10.3)
CO2: 26 mmol/L (ref 22–32)
Chloride: 106 mmol/L (ref 101–111)
Creatinine, Ser: 0.79 mg/dL (ref 0.61–1.24)
GFR calc Af Amer: >60 mL/min (ref >60)
GLUCOSE: 115 mg/dL — AB (ref 65–99)
POTASSIUM: 4 mmol/L (ref 3.5–5.1)
SODIUM: 140 mmol/L (ref 135–145)

## 2015-08-29 LAB — TSH: TSH: 2.628 u[IU]/mL (ref 0.350–4.500)

## 2015-08-29 LAB — CBC
HCT: 35.7 % — ABNORMAL LOW (ref 39.0–52.0)
Hemoglobin: 12 g/dL — ABNORMAL LOW (ref 13.0–17.0)
MCH: 31.3 pg (ref 26.0–34.0)
MCHC: 33.6 g/dL (ref 30.0–36.0)
MCV: 93 fL (ref 78.0–100.0)
Platelets: 134 10*3/uL — ABNORMAL LOW (ref 150–400)
RBC: 3.84 MIL/uL — AB (ref 4.22–5.81)
RDW: 15.3 % (ref 11.5–15.5)
WBC: 4 10*3/uL (ref 4.0–10.5)

## 2015-08-29 LAB — BRAIN NATRIURETIC PEPTIDE: B NATRIURETIC PEPTIDE 5: 680.2 pg/mL — AB (ref 0.0–100.0)

## 2015-08-29 LAB — MAGNESIUM: Magnesium: 2 mg/dL (ref 1.7–2.4)

## 2015-08-29 MED ORDER — ROSUVASTATIN CALCIUM 10 MG PO TABS
20.0000 mg | ORAL_TABLET | Freq: Every day | ORAL | Status: DC
  Administered 2015-08-29 (×2): 20 mg via ORAL
  Filled 2015-08-29 (×2): qty 2

## 2015-08-29 MED ORDER — GUAIFENESIN-CODEINE 100-10 MG/5ML PO SOLN
5.0000 mL | Freq: Four times a day (QID) | ORAL | Status: DC | PRN
  Administered 2015-08-29: 5 mL via ORAL
  Filled 2015-08-29: qty 5

## 2015-08-29 MED ORDER — ACETAMINOPHEN 650 MG RE SUPP: 650.0000 mg | Freq: Four times a day (QID) | RECTAL | Status: DC | PRN

## 2015-08-29 MED ORDER — BENZONATATE 100 MG PO CAPS
100.0000 mg | ORAL_CAPSULE | Freq: Three times a day (TID) | ORAL | Status: DC
  Administered 2015-08-30 (×2): 100 mg via ORAL
  Filled 2015-08-29 (×3): qty 1

## 2015-08-29 MED ORDER — ALBUTEROL SULFATE (2.5 MG/3ML) 0.083% IN NEBU: 2.5000 mg | INHALATION_SOLUTION | RESPIRATORY_TRACT | Status: DC | PRN

## 2015-08-29 MED ORDER — ONDANSETRON HCL 4 MG PO TABS: 4.0000 mg | ORAL_TABLET | Freq: Four times a day (QID) | ORAL | Status: DC | PRN

## 2015-08-29 MED ORDER — NIACIN ER 500 MG PO CPCR
1000.0000 mg | ORAL_CAPSULE | Freq: Every morning | ORAL | Status: DC
  Filled 2015-08-29 (×4): qty 2

## 2015-08-29 MED ORDER — ASPIRIN EC 81 MG PO TBEC
81.0000 mg | DELAYED_RELEASE_TABLET | Freq: Every day | ORAL | Status: DC
  Administered 2015-08-29: 81 mg via ORAL
  Filled 2015-08-29: qty 1

## 2015-08-29 MED ORDER — VITAMIN D 1000 UNITS PO TABS
1000.0000 [IU] | ORAL_TABLET | Freq: Every day | ORAL | Status: DC
  Administered 2015-08-29 – 2015-08-30 (×2): 1000 [IU] via ORAL
  Filled 2015-08-29 (×2): qty 1

## 2015-08-29 MED ORDER — ADULT MULTIVITAMIN W/MINERALS CH
1.0000 | ORAL_TABLET | Freq: Every day | ORAL | Status: DC
  Administered 2015-08-29 – 2015-08-30 (×2): 1 via ORAL
  Filled 2015-08-29 (×2): qty 1

## 2015-08-29 MED ORDER — ONDANSETRON HCL 4 MG/2ML IJ SOLN: 4.0000 mg | Freq: Four times a day (QID) | INTRAMUSCULAR | Status: DC | PRN

## 2015-08-29 MED ORDER — AMIODARONE LOAD VIA INFUSION
150.0000 mg | Freq: Once | INTRAVENOUS | Status: AC
  Administered 2015-08-29: 150 mg via INTRAVENOUS
  Filled 2015-08-29: qty 83.34

## 2015-08-29 MED ORDER — ACETAMINOPHEN 325 MG PO TABS: 650.0000 mg | ORAL_TABLET | Freq: Four times a day (QID) | ORAL | Status: DC | PRN

## 2015-08-29 MED ORDER — GUAIFENESIN-DM 100-10 MG/5ML PO SYRP
5.0000 mL | ORAL_SOLUTION | ORAL | Status: DC | PRN
  Administered 2015-08-29: 5 mL via ORAL
  Filled 2015-08-29: qty 5

## 2015-08-29 MED ORDER — AMIODARONE HCL IN DEXTROSE 360-4.14 MG/200ML-% IV SOLN
60.0000 mg/h | INTRAVENOUS | Status: AC
  Administered 2015-08-29: 60 mg/h via INTRAVENOUS
  Filled 2015-08-29 (×2): qty 200

## 2015-08-29 MED ORDER — DILTIAZEM HCL 100 MG IV SOLR
5.0000 mg/h | INTRAVENOUS | Status: DC
  Administered 2015-08-29: 10 mg/h via INTRAVENOUS
  Filled 2015-08-29: qty 100

## 2015-08-29 MED ORDER — SENNOSIDES-DOCUSATE SODIUM 8.6-50 MG PO TABS
1.0000 | ORAL_TABLET | Freq: Every evening | ORAL | Status: DC | PRN
  Filled 2015-08-29: qty 1

## 2015-08-29 MED ORDER — APIXABAN 5 MG PO TABS
5.0000 mg | ORAL_TABLET | Freq: Two times a day (BID) | ORAL | Status: DC
  Administered 2015-08-29 – 2015-08-30 (×4): 5 mg via ORAL
  Filled 2015-08-29 (×4): qty 1

## 2015-08-29 MED ORDER — GUAIFENESIN ER 600 MG PO TB12
1200.0000 mg | ORAL_TABLET | Freq: Two times a day (BID) | ORAL | Status: DC
  Administered 2015-08-29 – 2015-08-30 (×2): 1200 mg via ORAL
  Filled 2015-08-29 (×2): qty 2

## 2015-08-29 MED ORDER — AMOXICILLIN-POT CLAVULANATE 875-125 MG PO TABS
1.0000 | ORAL_TABLET | Freq: Two times a day (BID) | ORAL | Status: DC
  Administered 2015-08-29 – 2015-08-30 (×3): 1 via ORAL
  Filled 2015-08-29 (×3): qty 1

## 2015-08-29 MED ORDER — AMIODARONE HCL IN DEXTROSE 360-4.14 MG/200ML-% IV SOLN
30.0000 mg/h | INTRAVENOUS | Status: DC
  Administered 2015-08-29 – 2015-08-30 (×3): 30 mg/h via INTRAVENOUS
  Filled 2015-08-29 (×2): qty 200

## 2015-08-29 MED ORDER — OMEGA-3-ACID ETHYL ESTERS 1 G PO CAPS
1000.0000 mg | ORAL_CAPSULE | Freq: Every day | ORAL | Status: DC
  Administered 2015-08-29 – 2015-08-30 (×2): 1000 mg via ORAL
  Filled 2015-08-29 (×2): qty 1

## 2015-08-29 NOTE — Progress Notes (Signed)
   08/29/15 0110  Vitals  Temp 98.1 F (36.7 C)  Temp Source Oral  BP (!) 165/98 mmHg  BP Location Right Arm  BP Method Automatic  Patient Position (if appropriate) Lying  Resp 15  Oxygen Therapy  SpO2 96 %  O2 Device Room Air  Pain Assessment  Pain Assessment No/denies pain    Patient arrived from the ED. Patient alert and oriented X4. Patient oriented to the unit, room and bed alarm. Wife at bedside.CCMD called and verified. No further needs assessed, all questions were answered. Will continue to monitor closely.  Jinny SandersJadji, Felicia Both RN

## 2015-08-29 NOTE — H&P (Signed)
History and Physical  Patient Name: Edgar Thomas     AYT:[id number]    DOB: [birth year]    DOA: 08/28/2015 Referring physician: Pattricia Boss, MD PCP: Charletta Cousin, MD      Chief Complaint: Cough  HPI: Edgar Thomas is a 82 y.o. male with a past medical history significant for pAF on Eliquis, CHF EF 40% s/p CABG in 2015 who presents with cough.  The patient was in his usual state of health until about 2 weeks ago when he developed a sinusitis and was treated with doxycycline. Around the end of that course of antibiotics he started to feel generalized malaise and weakness, went to his cardiologist in [place], where he was found to be in recurrent A. fib with RVR. He was admitted to the hospital overnight in [place], cardioverted in the morning (last Wednesday), and discharged the same day on Eliquis without rate control agent.  Since then he has been fine, able to walk a mile on the treadmill each day today and yesterday without difficulty.  He was however starting to develop cough, and so he came to the ER to get an antibiotic.   In the ED, he was afebrile but tachycardic to the 140s in A. fib. BP 163/137, saturating well on ambient air.  Na 137, K 4.7, Cr 0.81, WBC 6K, Hgb 13, troponin negative, INR 1.35.  A chest x-ray showed no focal opacity.  ECG showed Afib with RVR.  He denied fever, dyspnea, sputum, rigors.  He was given diltiazem bolus and infusion was begun, and TRH were asked to evaluate for admission.  He has a history of Afib, since his NSTEMI in 2015 that preceded his CABG, was on amiodarone and warfarin for a time, but had returned to NSR and was taken off amiodarone, metoprolol and warfarin over a year ago until this week, when his Cardiologist in [place], Dr. Donnal Debar, performed the cardioversion.        Review of Systems:  Pt complains of cough. Pt denies any chest pain, leg swelling, orthopnea, nocturnal dyspnea, dyspnea with exertion, exercise intolerance.  He has no  palpitations.  He has no fever, chills, sputum, dyspnea.  All other systems negative except as just noted or noted in the history of present illness.  Allergies  Allergen Reactions  . Contrast Media [Iodinated Diagnostic Agents] Rash    19 years ago at [facility]  . Other Other (See Comments)    Patient preference not to eat "meat" or "diary"    Prior to Admission medications   Medication Sig Start Date End Date Taking? Authorizing Provider  aspirin EC 81 MG EC tablet Take 1 tablet (81 mg total) by mouth daily. Patient taking differently: Take 81 mg by mouth at bedtime.  12/11/13  Yes Erin R Barrett, PA-C  azelastine (ASTELIN) 0.1 % nasal spray Place 1 spray into both nostrils daily as needed for rhinitis. Use in each nostril as directed   Yes Historical Provider, MD  CRESTOR 20 MG tablet Take 20 mg by mouth at bedtime.  08/26/14  Yes Historical Provider, MD  doxycycline (VIBRAMYCIN) 100 MG capsule Take 100 mg by mouth 2 (two) times daily. 08/09/15  Yes Historical Provider, MD  ELIQUIS 5 MG TABS tablet Take 5 mg by mouth 2 (two) times daily. 08/25/15  Yes Historical Provider, MD  Multiple Vitamin (MULTIVITAMIN) capsule Take 1 capsule by mouth daily.   Yes Historical Provider, MD  niacin (NIASPAN) 1000 MG CR tablet Take 1,000 mg by mouth every  morning.    Yes Historical Provider, MD  Omega 3 1000 MG CAPS Take 1,000 mg by mouth daily.    Yes Historical Provider, MD  Sodium Chloride-Sodium Bicarb (NETI POT SINUS [place]) 2300-700 MG KIT Place 1 application into the nose daily as needed (for congestion).   Yes Historical Provider, MD  Vitamin D, Cholecalciferol, 1000 UNITS TABS Take 1,000 Units by mouth daily.    Yes Historical Provider, MD    Past Medical History  Diagnosis Date  . Hyperlipemia   . CAD (coronary artery disease)   . Heartburn   . A-fib ([place])   . Hypertension   . Heart attack ([place])   . Constipation   . GERD (gastroesophageal reflux disease)     Past Surgical  History  Procedure Laterality Date  . Balloon angioplasty of lad    . Colonoscopy    . Coronary artery bypass graft N/A 12/03/2013    Procedure: CORONARY ARTERY BYPASS GRAFTING (CABG) x4 using left internal mammary artery and right greater saphenous vein. LIMA to LAD, sequential SVG to OM 1 & OM 2, SVG to PD;  Surgeon: Grace Isaac, MD;  Location: [facility];  Service: Open Heart Surgery;  Laterality: N/A;  . Intraoperative transesophageal echocardiogram N/A 12/03/2013    Procedure: INTRAOPERATIVE TRANSESOPHAGEAL ECHOCARDIOGRAM;  Surgeon: Grace Isaac, MD;  Location: [facility];  Service: Open Heart Surgery;  Laterality: N/A;  . Endovein harvest of greater saphenous vein Right 12/03/2013    Procedure: ENDOVEIN HARVEST OF GREATER SAPHENOUS VEIN;  Surgeon: Grace Isaac, MD;  Location: [facility];  Service: Open Heart Surgery;  Laterality: Right;    Family history: family history includes Heart disease in his mother.  Social History: Patient lives with his wife.  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  He does not smoke or use alcohol.         Physical Exam: BP 137/108 mmHg  Pulse 101  Temp(Src) 97.2 F (36.2 C) (Oral)  Resp 22  Ht _0  (1.778 m)  Wt 73.681 kg (162 lb 7 oz)  BMI 23.31 kg/m2  SpO2 94% General appearance: Well-developed, elderly adult male, alert and in no acute distress.   Eyes: Anicteric, conjunctiva pink, lids and lashes normal.     ENT: No nasal deformity, discharge, or epistaxis.  No sinus pressure or tenderness.  OP moist without lesions.   Skin: Warm and dry.   Cardiac: Tachycardic, irregularly irregular, nl S1-S2, no murmurs appreciated.  Capillary refill is brisk.  JVP normale.  No LE edema.  Radial and DP pulses 2+ and symmetric. Respiratory: Normal respiratory rate and rhythm.  No rales or wheezes, some expiratory airway sounds present. Abdomen: Abdomen soft without rigidity.  No TTP. No ascites, distension.   MSK: No deformities or  effusions. Neuro: Sensorium intact and responding to questions, attention normal.  Speech is fluent.  Moves all extremities equally and with normal coordination.    Psych: Behavior appropriate.  Affect normal.  No evidence of aural or visual hallucinations or delusions.       Labs on Admission:  The metabolic panel shows normal platelets and renal function. Troponin normal. INR 1.35 The complete blood count shows no leukocytosis, anemia, thrombocytopenia.   Radiological Exams on Admission: Personally reviewed: Dg Chest 2 View  08/28/2015  CLINICAL DATA:  Cough, atrial fibrillation for 2 days. History of hypertension, atrial fibrillation, myocardial infarction. EXAM: CHEST  2 VIEW COMPARISON:  Chest x-rays dated 01/08/2014 and 12/07/2013.  FINDINGS: Cardiomegaly is stable. Overall cardiomediastinal silhouette is stable in size and configuration. Median sternotomy wires appear intact and stable in alignment. Lungs are hyperexpanded indicating COPD. Suspect associated chronic bronchitic changes centrally. Lungs are otherwise clear. No evidence of pneumonia. No pleural effusion or pneumothorax seen. No acute- appearing osseous abnormality. IMPRESSION: 1. Hyperexpanded lungs indicating COPD/emphysema. Suspect associated chronic bronchitic changes centrally. 2. No acute findings. No evidence of pneumonia. No evidence of active congestive heart failure. 3. Cardiomegaly is stable. Electronically Signed   By: Franki Cabot M.D.   On: 08/28/2015 23:05    EKG: Independently reviewed. Rate 127, A. Fib, no ischemic changes.    Assessment/Plan 1. Atrial fibrillation with RVR:  This is new.  This has recurred after the patient's recent cardioversion 4 days ago. He is currently anticoagulated on Eliquis. His CHADS2-VASc score is 3. -Diltiazem gtt, rate goal <105 -Continue home Eliquis -Consult to Cardiology, appreciate cares   2. Ischemic CM with EF 40%:  This is new.  No echo available since 2015,  before CABG.  Does exercise 2-3 miles per day, walking, without symptoms.   -Continue Crestor and aspirin 81 mg -Follows with Dr. Donnal Debar in [place]  3. Bronchitis:  -Cough syrup and supportive cares -Patient asked for antibiotic for bronchitis, which I have declined at present      DVT PPx: Eliquis Diet: Heart healthy Consultants: Cardiology Code Status: FULL Family Communication: Wife, present at bedside  Medical decision making: What exists of the patient's previous chart was reviewed in depth and the case was discussed with Dr. Jeanell Sparrow. Patient seen 12:05 AM on 08/29/2015.  Disposition Plan:  I recommend admission to telemetry.  Clinical condition: stable.  Anticipate diltiazem IV overnight, convert to oral and anticipate discharge tomorrow.  Will not make NPO.      Edwin Dada [facility] Pager [phone]

## 2015-08-29 NOTE — Discharge Instructions (Signed)

## 2015-08-29 NOTE — ED Notes (Signed)
Dr Maryfrances Bunnellanford stated he is aware of the BP being elevated and does not want to give him anything at this time

## 2015-08-29 NOTE — Progress Notes (Signed)
[facility]                                                                              Patient Demographics  Edgar ChurchBilly Thomas, is a 82 y.o. male, DOB - 05/05/1930, ZOX:[id number]  Admit date - 08/28/2015   Admitting Physician Edgar Samhristopher P Danford, MD  Outpatient Primary MD for the patient is Edgar FelixHOMAS,BRAD, MD  LOS -   days    Chief Complaint  Patient presents with  . Cough       Brief HPI   Edgar ChurchBilly Thomas is a 82 y.o. male with a past medical history significant for pAF on Eliquis, CHF EF 40% s/p CABG in 2015 who presented with cough. The patient was in his usual state of health until about 2 weeks ago when he developed a sinusitis and was treated with doxycycline. Around the end of that course of antibiotics he started to feel generalized malaise and weakness, went to his cardiologist in [place], where he was found to be in recurrent A. fib with RVR. He was admitted to the hospital overnight in [place], cardioverted in the morning (last Wednesday), and discharged the same day on Eliquis without rate control agent. Since then he has been fine, able to walk a mile on the treadmill each day. He was however starting to develop cough, and so he came to the ER to get an antibiotic.  In the ED, he was afebrile but tachycardic to the 140s in A. fib. BP 163/137, saturating well on ambient air. Na 137, K 4.7, Cr 0.81, WBC 6K, Hgb 13, troponin negative, INR 1.35. A chest x-Edgar showed no focal opacity. ECG showed Afib with RVR. He denied fever, dyspnea, sputum, rigors.  He was given diltiazem bolus, placed on infusion and admitted for further workup. He has a history of Afib, since his NSTEMI in 2015 that preceded his CABG, was on amiodarone and warfarin for a time, but had returned to NSR and was taken off amiodarone, metoprolol and warfarin over a year ago until this week, when his Cardiologist in [place], Edgar Thomas, performed the cardioversion.     Assessment & Plan     Atrial fibrillation with RVR:  -Currently rate controlled on Cardizem IV, recently cardioverted last Thursday on 3/30, on amiodarone - CHADS vasc 4, continue Eliquis - Cardiology consulted, will follow recommendations - TSH 2.6    Ischemic CM with EF 40%, CAD:   No echo available since 2015, before CABG. Does exercise 2-3 miles per day, walking, without symptoms.  Patient reports he had a TEE last week, will obtain records - Continue statin. -Discontinue aspirin per cardiology as patient is on anticoagulation    Acute on chronic Bronchitis:  -Currently no wheezing however patient reporting productive cough and phlegm. Chest x-Edgar negative for any pneumonia - Placed on Tessalon, Mucinex, Robitussin, Augmentin  Hyperlipidemia - Continue statin, niacin  Hypertension - Currently heart rate controlled and on IV Cardizem drip  Code Status: Full CODE STATUS  Family Communication: Discussed in detail with the patient, all imaging results, lab results explained to the patient  and wife at the bedside    Disposition Plan:  Time Spent in minutes 25 minutes  Procedures  None  Consults   Cardiology  DVT Prophylaxis eliquis  Medications  Scheduled Meds: . amoxicillin-clavulanate  1 tablet Oral Q12H  . apixaban  5 mg Oral BID  . benzonatate  100 mg Oral TID  . cholecalciferol  1,000 Units Oral Daily  . guaiFENesin  1,200 mg Oral BID  . multivitamin with minerals  1 tablet Oral Daily  . niacin  1,000 mg Oral q morning - 10a  . omega-3 acid ethyl esters  1,000 mg Oral Daily  . rosuvastatin  20 mg Oral QHS   Continuous Infusions: . amiodarone     Followed by  . amiodarone     PRN Meds:.acetaminophen **OR** acetaminophen, albuterol, guaiFENesin-dextromethorphan, ondansetron **OR** ondansetron (ZOFRAN) IV, senna-docusate   Antibiotics   Anti-infectives    Start     Dose/Rate Route Frequency Ordered Stop   08/29/15 1030  amoxicillin-clavulanate (AUGMENTIN)  875-125 MG per tablet 1 tablet     1 tablet Oral Every 12 hours 08/29/15 1017     08/28/15 2315  levofloxacin (LEVAQUIN) IVPB 500 mg  Status:  Discontinued     500 mg 100 mL/hr over 60 Minutes Intravenous  Once 08/28/15 2313 08/29/15 0015        Subjective:   Edgar Thomas was seen and examined today.Complaining of productive cough, phlegm but no fevers or chills. Upset about his cough and did not sleep well.  Patient denies dizziness, chest pain, abdominal pain, N/V/D/C, new weakness, numbess, tingling. No acute events overnight.    Objective:   Filed Vitals:   08/29/15 0000 08/29/15 0015 08/29/15 0110 08/29/15 0308  BP: 166/119 144/106 165/98 134/67  Pulse: 95 95  85  Temp:   98.1 F (36.7 C) 97.9 F (36.6 C)  TempSrc:   Oral Oral  Resp: Height:      Weight:      SpO2: 94% 93% 96% 96%    Intake/Output Summary (Last 24 hours) at 08/29/15 1106 Last data filed at 08/29/15 0524  Gross per 24 hour  Intake    500 ml  Output    600 ml  Net   -100 ml     Wt Readings from Last 3 Encounters:  08/28/15 73.681 kg (162 lb 7 oz)  03/02/14 70.353 kg (155 lb 1.6 oz)  02/26/14 69.4 kg (153 lb)     Exam  General: Alert and oriented x 3, NAD  HEENT:  PERRLA, EOMI, Anicteric Sclera, mucous membranes moist.   Neck: Supple, no JVD, no masses  ZOX:[id number] irregular   Respiratory:Decreased breath sounds at the bases otherwise fairly clear, no wheezing   Abdomen: Soft, nontender, nondistended, + bowel sounds  Ext: no cyanosis clubbing or edema  Neuro: AAOx3, Cr N's II- XII. Strength 5/5 upper and lower extremities bilaterally  Skin: No rashes  Psych: Normal affect and demeanor, alert and oriented x3    Data Reviewed:  I have personally reviewed following labs and imaging studies  Micro Results No results found for this or any previous visit (from the past 240 hour(s)).  Radiology Reports Dg Chest 2 View  08/28/2015  CLINICAL DATA:  Cough, atrial  fibrillation for 2 days. History of hypertension, atrial fibrillation, myocardial infarction. EXAM: CHEST  2 VIEW COMPARISON:  Chest x-rays dated 01/08/2014 and 12/07/2013. FINDINGS: Cardiomegaly is stable. Overall cardiomediastinal silhouette is stable in size and configuration. Median sternotomy wires appear intact and stable in alignment. Lungs are hyperexpanded indicating  COPD. Suspect associated chronic bronchitic changes centrally. Lungs are otherwise clear. No evidence of pneumonia. No pleural effusion or pneumothorax seen. No acute- appearing osseous abnormality. IMPRESSION: 1. Hyperexpanded lungs indicating COPD/emphysema. Suspect associated chronic bronchitic changes centrally. 2. No acute findings. No evidence of pneumonia. No evidence of active congestive heart failure. 3. Cardiomegaly is stable. Electronically Signed   By: Bary Richard M.D.   On: 08/28/2015 23:05    CBC  Recent Labs Lab 08/28/15 2134 08/28/15 2139 08/29/15 0204  WBC  --  6.0 4.0  HGB 15.0 13.6 12.0*  HCT 44.0 40.9 35.7*  PLT  --  153 134*  MCV  --  93.4 93.0  MCH  --  31.1 31.3  MCHC  --  33.3 33.6  RDW  --  15.3 15.3    Chemistries   Recent Labs Lab 08/28/15 2134 08/28/15 2203 08/29/15 0020 08/29/15 0204  NA 140 137  --  140  K 4.6 4.7  --  4.0  CL 104 106  --  106  CO2  --  23  --  26  GLUCOSE 110* 110*  --  115*  BUN 22* 19  --  15  CREATININE 0.80 0.81  --  0.79  CALCIUM  --  9.1  --  8.5*  MG  --   --  2.0  --    ------------------------------------------------------------------------------------------------------------------ estimated creatinine clearance is 72.2 mL/min (by C-G formula based on Cr of 0.79). ------------------------------------------------------------------------------------------------------------------ No results for input(s): HGBA1C in the last 72  hours. ------------------------------------------------------------------------------------------------------------------ No results for input(s): CHOL, HDL, LDLCALC, TRIG, CHOLHDL, LDLDIRECT in the last 72 hours. ------------------------------------------------------------------------------------------------------------------  Recent Labs  08/29/15 0020  TSH 2.628   ------------------------------------------------------------------------------------------------------------------ No results for input(s): VITAMINB12, FOLATE, FERRITIN, TIBC, IRON, RETICCTPCT in the last 72 hours.  Coagulation profile  Recent Labs Lab 08/28/15 2139  INR 1.35    No results for input(s): DDIMER in the last 72 hours.  Cardiac Enzymes No results for input(s): CKMB, TROPONINI, MYOGLOBIN in the last 168 hours.  Invalid input(s): CK ------------------------------------------------------------------------------------------------------------------ Invalid input(s): POCBNP  No results for input(s): GLUCAP in the last 72 hours.   RAI,RIPUDEEP M.D. [facility] 08/29/2015, 11:06 AM  Pager: [phone] Between 7am to 7pm - call Pager - [phone]  After 7pm go to [email or web address] - password [email or web address]  Call night coverage person covering after 7pm

## 2015-08-29 NOTE — Consult Note (Addendum)
Cardiologist: Hochrein(only seen once post CABG)  Follows with Dr. Donnal Debar in [place] Reason for Consult: afib RVR Referring Physician:   D'Arcy Abraha is an 82 y.o. male.  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  He takes Eliquis 78m BID. It coronary artery bypass grafting 29 November 2013 LIMA to LAD, sequential SVG to OM 1 & OM 2, SVG to PD by Dr. GServando Snare We will see him one time in 2015 post bypass.  His last echocardiogram was 12/03/2013 his ejection fraction was 40-45%, mild MR  He presents with atrial fibrillation with rapid ventricular response. Patient reports 3 weeks ago she started having issues with sinusitis. He was treated with doxycycline. He hasn't really felt right last Tuesday he was feeling tired in his legs and [place] He son been in atrial fibrillation with rapid ventricular response and underwent TEE cardioversion with Dr. HDonnal Debar  He was started on Eliquis. Usually walks about 4 miles a day.  The day after his cardioversion he walked 174mke without much difficulty.  Since that time he developed a cough and has been using an Nettie pot.  He was diagnosed with bronchitis.  The patient currently denies nausea, vomiting, fever, chest pain, shortness of breath, orthopnea, dizziness, PND, congestion, abdominal pain, hematochezia, melena, lower extremity edema, claudication.  Chest x-ray shows COPD without acute cardiopulmonary disease. BNP is 680.2. EKG showed atrial fibrillation with a rapid ventricular response. Initial and only troponin was negative.  Past Medical History  Diagnosis Date  . Hyperlipemia   . CAD (coronary artery disease)   . Heartburn   . A-fib ([facility]  . Hypertension   . Heart attack ([place]  . Constipation   . GERD (gastroesophageal reflux disease)     Past Surgical History  Procedure  Laterality Date  . Balloon angioplasty of lad    . Colonoscopy    . Coronary artery bypass graft N/A 12/03/2013    Procedure: CORONARY ARTERY BYPASS GRAFTING (CABG) x4 using left internal mammary artery and right greater saphenous vein. LIMA to LAD, sequential SVG to OM 1 & OM 2, SVG to PD;  Surgeon: EdGrace IsaacMD;  [facility];  Laterality: N/A;  . Intraoperative transesophageal echocardiogram N/A 12/03/2013    Procedure: INTRAOPERATIVE TRANSESOPHAGEAL ECHOCARDIOGRAM;  Surgeon: EdGrace IsaacMD;  [facility];  Laterality: N/A;  . Endovein harvest of greater saphenous vein Right 12/03/2013    Procedure: ENDOVEIN HARVEST OF GREATER SAPHENOUS VEIN;  Surgeon: EdGrace IsaacMD;  [facility];  Laterality: Right;    Family History  Problem Relation Age of Onset  . Heart disease Mother     died at age [id number] heart attack    Social History:  reports that he has never smoked. He has never used smokeless tobacco. He reports that he does not drink alcohol or use illicit drugs.  Allergies:  Allergies  Allergen Reactions  . Contrast Media [Iodinated Diagnostic Agents] Rash    [facility] Other Other (See Comments)    Patient preference not to eat "meat" or "diary"    Medications: Scheduled Meds: . apixaban  5 mg Oral BID  . aspirin EC  81 mg Oral QHS  . cholecalciferol  1,000 Units Oral Daily  . multivitamin with minerals  1 tablet Oral Daily  . niacin  1,000 mg Oral q morning - 10a  . omega-3 acid ethyl esters  1,000 mg Oral Daily  . rosuvastatin  20 mg Oral QHS   Continuous Infusions: . diltiazem (CARDIZEM) infusion 10 mg/hr (08/29/15 0743)   PRN Meds:.acetaminophen **OR** acetaminophen, albuterol, guaiFENesin-codeine, guaiFENesin-dextromethorphan, ondansetron **OR** ondansetron (ZOFRAN) IV, senna-docusate   Results for orders placed or  performed during the hospital encounter of 08/28/15 (from the past 48 hour(s))  I-Stat Troponin, ED (not at [facility])     Status: None   Collection Time: 08/28/15  9:32 PM  Result Value Ref Range   Troponin i, poc 0.03 0.00 - 0.08 ng/mL   Comment 3            Comment: Due to the release kinetics of cTnI, a negative result within the first hours of the onset of symptoms does not rule out myocardial infarction with certainty. If myocardial infarction is still suspected, repeat the test at appropriate intervals.   I-Stat Chem 8, ED     Status: Abnormal   Collection Time: 08/28/15  9:34 PM  Result Value Ref Range   Sodium 140 135 - 145 mmol/L   Potassium 4.6 3.5 - 5.1 mmol/L   Chloride 104 101 - 111 mmol/L   BUN 22 (H) 6 - 20 mg/dL   Creatinine, Ser 0.80 0.61 - 1.24 mg/dL   Glucose, Bld 110 (H) 65 - 99 mg/dL   Calcium, Ion 1.14 1.13 - 1.30 mmol/L   TCO2 22 0 - 100 mmol/L   Hemoglobin 15.0 13.0 - 17.0 g/dL   HCT 44.0 39.0 - 52.0 %  CBC     Status: None   Collection Time: 08/28/15  9:39 PM  Result Value Ref Range   WBC 6.0 4.0 - 10.5 K/uL   RBC 4.38 4.22 - 5.81 MIL/uL   Hemoglobin 13.6 13.0 - 17.0 g/dL   HCT 40.9 39.0 - 52.0 %   MCV 93.4 78.0 - 100.0 fL   MCH 31.1 26.0 - 34.0 pg   MCHC 33.3 30.0 - 36.0 g/dL   RDW 15.3 11.5 - 15.5 %   Platelets 153 150 - 400 K/uL  Protime-INR     Status: Abnormal   Collection Time: 08/28/15  9:39 PM  Result Value Ref Range   Prothrombin Time 16.8 (H) 11.6 - 15.2 seconds   INR 1.35 0.00 - 3.54  Basic metabolic panel     Status: Abnormal   Collection Time: 08/28/15 10:03 PM  Result Value Ref Range   Sodium 137 135 - 145 mmol/L   Potassium 4.7 3.5 - 5.1 mmol/L   Chloride 106 101 - 111 mmol/L   CO2 23 22 - 32 mmol/L   Glucose, Bld 110 (H) 65 - 99 mg/dL   BUN 19 6 - 20 mg/dL   Creatinine, Ser 0.81 0.61 - 1.24 mg/dL   Calcium 9.1 8.9 - 10.3 mg/dL   GFR calc non Af Amer >60 >60 mL/min   GFR calc Af Amer >60 >60 mL/min    Comment: (NOTE) The eGFR  has been calculated using the CKD EPI equation. This calculation has not been validated in all clinical situations. eGFR's persistently <60 mL/min signify possible Chronic Kidney Disease.    Anion gap 8 5 - 15  TSH     Status: None   Collection Time: 08/29/15 12:20 AM  Result Value Ref Range   TSH 2.628 0.350 -  4.500 uIU/mL  Magnesium     Status: None   Collection Time: 08/29/15 12:20 AM  Result Value Ref Range   Magnesium 2.0 1.7 - 2.4 mg/dL  CBC     Status: Abnormal   Collection Time: 08/29/15  2:04 AM  Result Value Ref Range   WBC 4.0 4.0 - 10.5 K/uL   RBC 3.84 (L) 4.22 - 5.81 MIL/uL   Hemoglobin 12.0 (L) 13.0 - 17.0 g/dL   HCT 35.7 (L) 39.0 - 52.0 %   MCV 93.0 78.0 - 100.0 fL   MCH 31.3 26.0 - 34.0 pg   MCHC 33.6 30.0 - 36.0 g/dL   RDW 15.3 11.5 - 15.5 %   Platelets 134 (L) 150 - 400 K/uL  Basic metabolic panel     Status: Abnormal   Collection Time: 08/29/15  2:04 AM  Result Value Ref Range   Sodium 140 135 - 145 mmol/L   Potassium 4.0 3.5 - 5.1 mmol/L   Chloride 106 101 - 111 mmol/L   CO2 26 22 - 32 mmol/L   Glucose, Bld 115 (H) 65 - 99 mg/dL   BUN 15 6 - 20 mg/dL   Creatinine, Ser 0.79 0.61 - 1.24 mg/dL   Calcium 8.5 (L) 8.9 - 10.3 mg/dL   GFR calc non Af Amer >60 >60 mL/min   GFR calc Af Amer >60 >60 mL/min    Comment: (NOTE) The eGFR has been calculated using the CKD EPI equation. This calculation has not been validated in all clinical situations. eGFR's persistently <60 mL/min signify possible Chronic Kidney Disease.    Anion gap 8 5 - 15  Brain natriuretic peptide     Status: Abnormal   Collection Time: 08/29/15  2:04 AM  Result Value Ref Range   B Natriuretic Peptide 680.2 (H) 0.0 - 100.0 pg/mL    Dg Chest 2 View  08/28/2015  CLINICAL DATA:  Cough, atrial fibrillation for 2 days. History of hypertension, atrial fibrillation, myocardial infarction. EXAM: CHEST  2 VIEW COMPARISON:  Chest x-rays dated 01/08/2014 and 12/07/2013. FINDINGS: Cardiomegaly is  stable. Overall cardiomediastinal silhouette is stable in size and configuration. Median sternotomy wires appear intact and stable in alignment. Lungs are hyperexpanded indicating COPD. Suspect associated chronic bronchitic changes centrally. Lungs are otherwise clear. No evidence of pneumonia. No pleural effusion or pneumothorax seen. No acute- appearing osseous abnormality. IMPRESSION: 1. Hyperexpanded lungs indicating COPD/emphysema. Suspect associated chronic bronchitic changes centrally. 2. No acute findings. No evidence of pneumonia. No evidence of active congestive heart failure. 3. Cardiomegaly is stable. Electronically Signed   By: Franki Cabot M.D.   On: 08/28/2015 23:05    Review of Systems  Constitutional: Negative for fever, chills and diaphoresis.  HENT: Positive for sore throat (a little irritated after TEE).   Respiratory: Positive for cough. Negative for shortness of breath and wheezing.   Cardiovascular: Negative for chest pain, palpitations, leg swelling and PND.  Gastrointestinal: Positive for constipation (Chronic). Negative for nausea, vomiting, abdominal pain, blood in stool and melena.  Genitourinary: Negative for hematuria.  Musculoskeletal: Negative for myalgias.  Neurological: Negative for dizziness.  All other systems reviewed and are negative.  Blood pressure 134/67, pulse 85, temperature 97.9 F (36.6 C), temperature source Oral, resp. rate 18, height '5\' 10"'  (1.778 m), weight 162 lb 7 oz (73.681 kg), SpO2 96 %. Physical Exam  Nursing note and vitals reviewed. Constitutional: He appears well-developed and well-nourished. No distress.  HENT:  Head: Normocephalic and atraumatic.  Eyes: EOM are  normal. Pupils are equal, round, and reactive to light. No scleral icterus.  Neck: Normal range of motion. Neck supple.  Cardiovascular: Normal rate, S1 normal and S2 normal.  An irregularly irregular rhythm present.  No murmur heard. Pulses:      Radial pulses are 2+ on  the right side, and 2+ on the left side.       Dorsalis pedis pulses are 2+ on the right side, and 2+ on the left side.  Respiratory: Effort normal and breath sounds normal. He has no wheezes. He has no rales.  GI: Soft. Bowel sounds are normal. He exhibits no distension. There is no tenderness.  Musculoskeletal: He exhibits no edema.  Lymphadenopathy:    He has no cervical adenopathy.  Neurological: He is alert.  Skin: Skin is warm and dry.  Psychiatric: He has a normal mood and affect.    Assessment/Plan:    Atrial fibrillation with RVR (HCC) Rate is now controlled on 10 mg per hour IV Cardizem.  CHADSVASC 4(CAD, age, CHF).  On Eliquis.  Status post TEE cardioversion last Thursday with Dr. Donnal Debar in [place].  Will get a TEE records.  Perhaps the recent illness is the trigger for his atrial fibrillation.  I would not reattempt cardioversion until we see the TEE results.  May need reinitiation of amiodarone prior, which which we can load by mouth and reattempt cardioversion in 2-3 weeks.   He seems to be tolerating his controlled rate right now and was not particularly symptomatic with the RVR.  His blood pressure is tolerating IV Cardizem.  Recommend switching to by mouth.  Recommend getting up to ambulate today.  Troponin x 1 negative.    TSH WNL  Discussed with Dr. Haroldine Laws; will initiate IV amiodarone and reattempt DCCV in 2-3 days.  Wean/stop diltiazem.       CABG x4 11/2013   CAD-   No recent angina like he had prior to bypass surgery.   Cardiomyopathy, ischemic- EF 40-45% By TEE 11/2013.  Current EF is unknown however, patient shows no signs of heart failure. His BNP is likely elevated from A. fib RVR.   Bronchitis    Tarri Fuller, PAC 08/29/2015, 8:57 AM   Patient seen and examined with Tarri Fuller, PA-C. We discussed all aspects of the encounter. I agree with the assessment and plan as stated above.   He has recurrent symptomatic AF after recent DC-CV. Will need AA therapy.  With reduced EF, amio, tikosyn and sotalol only real options. Given mildly increased QTc will load amio. Plan repeat DC-CV in 48 hours if doesn't convert. Continue Eliquis. Stop ASA.   Jermale Crass,MD 10:38 AM

## 2015-08-30 ENCOUNTER — Encounter (HOSPITAL_COMMUNITY): Payer: Self-pay | Admitting: General Practice

## 2015-08-30 DIAGNOSIS — I2510 Atherosclerotic heart disease of native coronary artery without angina pectoris: Secondary | ICD-10-CM

## 2015-08-30 LAB — BASIC METABOLIC PANEL
Anion gap: 9 (ref 5–15)
BUN: 13 mg/dL (ref 6–20)
CHLORIDE: 105 mmol/L (ref 101–111)
CO2: 22 mmol/L (ref 22–32)
Calcium: 8.5 mg/dL — ABNORMAL LOW (ref 8.9–10.3)
Creatinine, Ser: 0.87 mg/dL (ref 0.61–1.24)
GFR calc non Af Amer: >60 mL/min (ref >60)
Glucose, Bld: 107 mg/dL — ABNORMAL HIGH (ref 65–99)
POTASSIUM: 3.9 mmol/L (ref 3.5–5.1)
SODIUM: 136 mmol/L (ref 135–145)

## 2015-08-30 LAB — CBC
HEMATOCRIT: 38.5 % — AB (ref 39.0–52.0)
HEMOGLOBIN: 13 g/dL (ref 13.0–17.0)
MCH: 31.3 pg (ref 26.0–34.0)
MCHC: 33.8 g/dL (ref 30.0–36.0)
MCV: 92.8 fL (ref 78.0–100.0)
Platelets: 133 10*3/uL — ABNORMAL LOW (ref 150–400)
RBC: 4.15 MIL/uL — AB (ref 4.22–5.81)
RDW: 15.3 % (ref 11.5–15.5)
WBC: 7.2 10*3/uL (ref 4.0–10.5)

## 2015-08-30 MED ORDER — LEVALBUTEROL HCL 0.63 MG/3ML IN NEBU
0.6300 mg | INHALATION_SOLUTION | Freq: Four times a day (QID) | RESPIRATORY_TRACT | Status: DC
  Administered 2015-08-30: 0.63 mg via RESPIRATORY_TRACT
  Filled 2015-08-30: qty 3

## 2015-08-30 MED ORDER — CARVEDILOL 6.25 MG PO TABS
6.2500 mg | ORAL_TABLET | Freq: Two times a day (BID) | ORAL | Status: DC
  Administered 2015-08-30 (×2): 6.25 mg via ORAL
  Filled 2015-08-30 (×2): qty 1

## 2015-08-30 MED ORDER — CARVEDILOL 6.25 MG PO TABS
6.2500 mg | ORAL_TABLET | Freq: Two times a day (BID) | ORAL | Status: DC
Start: 2015-08-30 — End: 2018-11-08

## 2015-08-30 MED ORDER — ASPIRIN EC 81 MG PO TBEC
81.0000 mg | DELAYED_RELEASE_TABLET | Freq: Every day | ORAL | Status: DC
  Administered 2015-08-30: 81 mg via ORAL
  Filled 2015-08-30: qty 1

## 2015-08-30 MED ORDER — GUAIFENESIN ER 600 MG PO TB12: 1200.0000 mg | ORAL_TABLET | Freq: Two times a day (BID) | ORAL | Status: DC

## 2015-08-30 MED ORDER — AMIODARONE HCL 200 MG PO TABS: ORAL_TABLET | ORAL | Status: DC

## 2015-08-30 MED ORDER — AMIODARONE HCL 200 MG PO TABS
400.0000 mg | ORAL_TABLET | Freq: Two times a day (BID) | ORAL | Status: DC
  Administered 2015-08-30: 400 mg via ORAL
  Filled 2015-08-30: qty 2

## 2015-08-30 MED ORDER — POLYETHYLENE GLYCOL 3350 17 G PO PACK: 17.0000 g | PACK | Freq: Every day | ORAL | Status: DC | PRN

## 2015-08-30 MED ORDER — BISACODYL 10 MG RE SUPP
10.0000 mg | Freq: Once | RECTAL | Status: AC
  Administered 2015-08-30: 10 mg via RECTAL
  Filled 2015-08-30: qty 1

## 2015-08-30 MED ORDER — AMOXICILLIN-POT CLAVULANATE 875-125 MG PO TABS: 1.0000 | ORAL_TABLET | Freq: Two times a day (BID) | ORAL | Status: DC

## 2015-08-30 MED ORDER — BENZONATATE 100 MG PO CAPS: 100.0000 mg | ORAL_CAPSULE | Freq: Three times a day (TID) | ORAL | Status: DC | PRN

## 2015-08-30 NOTE — Progress Notes (Signed)
Patient ID: Edgar ChurchBilly Thomas, male   DOB: 04/10/1930, 82 y.o.   MRN: [id number]    Subjective:  Denies SSCP, palpitations or Dyspnea Cough better which has been his primary complaint  Objective:  Filed Vitals:   08/29/15 2100 08/29/15 2357 08/30/15 0154 08/30/15 0538  BP: 153/88   144/89  Pulse: 82   95  Temp: 98.2 F (36.8 C) 99.3 F (37.4 C) 98.7 F (37.1 C) 98.5 F (36.9 C)  TempSrc: Oral Oral Oral Oral  Resp: 18   20  Height:      Weight:      SpO2: 95%   94%    Intake/Output from previous day:  Intake/Output Summary (Last 24 hours) at 08/30/15 0846 Last data filed at 08/29/15 1833  Gross per 24 hour  Intake    720 ml  Output    300 ml  Net    420 ml    Physical Exam: Affect appropriate Healthy:  appears stated age HEENT: normal Neck supple with no adenopathy JVP normal no bruits no thyromegaly Lungs clear with no wheezing and good diaphragmatic motion Heart:  S1/S2 no murmur, no rub, gallop or click PMI normal Abdomen: benighn, BS positve, no tenderness, no AAA no bruit.  No HSM or HJR Distal pulses intact with no bruits No edema Neuro non-focal Skin warm and dry No muscular weakness   Lab Results: Basic Metabolic Panel:  Recent Labs  40/98/1102/07/15 0020 08/29/15 0204 08/30/15 0240  NA  --  140 136  K  --  4.0 3.9  CL  --  106 105  CO2  --  26 22  GLUCOSE  --  115* 107*  BUN  --  15 13  CREATININE  --  0.79 0.87  CALCIUM  --  8.5* 8.5*  MG 2.0  --   --    CBC:  Recent Labs  08/29/15 0204 08/30/15 0240  WBC 4.0 7.2  HGB 12.0* 13.0  HCT 35.7* 38.5*  MCV 93.0 92.8  PLT 134* 133*   Thyroid Function Tests:  Recent Labs  08/29/15 0020  TSH 2.628    Imaging: Dg Chest 2 View  08/28/2015  CLINICAL DATA:  Cough, atrial fibrillation for 2 days. History of hypertension, atrial fibrillation, myocardial infarction. EXAM: CHEST  2 VIEW COMPARISON:  Chest x-rays dated 01/08/2014 and 12/07/2013. FINDINGS: Cardiomegaly is stable. Overall  cardiomediastinal silhouette is stable in size and configuration. Median sternotomy wires appear intact and stable in alignment. Lungs are hyperexpanded indicating COPD. Suspect associated chronic bronchitic changes centrally. Lungs are otherwise clear. No evidence of pneumonia. No pleural effusion or pneumothorax seen. No acute- appearing osseous abnormality. IMPRESSION: 1. Hyperexpanded lungs indicating COPD/emphysema. Suspect associated chronic bronchitic changes centrally. 2. No acute findings. No evidence of pneumonia. No evidence of active congestive heart failure. 3. Cardiomegaly is stable. Electronically Signed   By: Bary RichardStan  Maynard M.D.   On: 08/28/2015 23:05    Cardiac Studies:  ECG:  08/28/15 rapid fib/flutter rate 127 IVCD poor R wave progression    Telemetry:  NSR PaC;s  08/30/2015   Echo:   Medications:   . amoxicillin-clavulanate  1 tablet Oral Q12H  . apixaban  5 mg Oral BID  . benzonatate  100 mg Oral TID  . cholecalciferol  1,000 Units Oral Daily  . guaiFENesin  1,200 mg Oral BID  . multivitamin with minerals  1 tablet Oral Daily  . niacin  1,000 mg Oral q morning - 10a  . omega-3 acid ethyl esters  1,000 mg Oral Daily  . rosuvastatin  20 mg Oral QHS     . amiodarone 30 mg/hr (08/29/15 2206)    Assessment/Plan:  PAF:  DCC in [place] last Wendsday.  With CAD started on amiodarone by DB and converted continue iv drip today and convert to 400 bid In am for a week then 200 bid.  CAD/CABG:  2015  No angina  Should be on baby aspirin  Add beta blocker needed for rate control PAF as well  Probably ok for d/c in am     Charlton Haws 08/30/2015, 8:46 AM

## 2015-08-30 NOTE — Progress Notes (Signed)
I spoke to with Dr. Nishan.  The patient is ok for DC home today.  His amio was changed to <BADTEEden EmmsXTTAG>400mg  PO BID.  Decrease dose to 200mg  BID in one week.  Follow uop with Dr. Rolene ArbourHakas in [place].  [place], [facility] PAC

## 2015-08-30 NOTE — Progress Notes (Signed)
[facility]                                                                              Patient Demographics  Edgar Thomas, is a 82 y.o. male, DOB - August 03, 1929, ZOX:[id number]  Admit date - 08/28/2015   Admitting Physician Alberteen Sam, MD  Outpatient Primary MD for the patient is THOMAS,BRAD, MD  LOS - 1  days    Chief Complaint  Patient presents with  . Cough       Brief HPI   Edgar Thomas is a 82 y.o. male with a past medical history significant for pAF on Eliquis, CHF EF 40% s/p CABG in 2015 who presented with cough. The patient was in his usual state of health until about 2 weeks ago when he developed a sinusitis and was treated with doxycycline. Around the end of that course of antibiotics he started to feel generalized malaise and weakness, went to his cardiologist in [place], where he was found to be in recurrent A. fib with RVR. He was admitted to the hospital overnight in [place], cardioverted in the morning (last Wednesday), and discharged the same day on Eliquis without rate control agent. Since then he has been fine, able to walk a mile on the treadmill each day. He was however starting to develop cough, and so he came to the ER to get an antibiotic.  In the ED, he was afebrile but tachycardic to the 140s in A. fib. BP 163/137, saturating well on ambient air. Na 137, K 4.7, Cr 0.81, WBC 6K, Hgb 13, troponin negative, INR 1.35. A chest x-ray showed no focal opacity. ECG showed Afib with RVR. He denied fever, dyspnea, sputum, rigors.  He was given diltiazem bolus, placed on infusion and admitted for further workup. He has a history of Afib, since his NSTEMI in 2015 that preceded his CABG, was on amiodarone and warfarin for a time, but had returned to NSR and was taken off amiodarone, metoprolol and warfarin over a year ago until this week, when his Cardiologist in [place], Dr. Rolene Arbour, performed the cardioversion.     Assessment & Plan     Atrial fibrillation with RVR: Recently cardioverted on 3/30 -Currently rate controlled, Converted, on amiodarone drip.  - CHADS vasc 4, continue Eliquis - Cardiology consulted, will follow recommendations - TSH 2.6    Ischemic CM with EF 40%, CAD:   No echo available since 2015, before CABG. Does exercise 2-3 miles per day, walking, without symptoms.  Patient reports he had a TEE last week, will obtain records - Continue statin. -Discontinue aspirin per cardiology as patient is on anticoagulation    Acute on chronic Bronchitis:  -Coughing is mildly improving,. Chest x-ray negative for any pneumonia - Placed on Tessalon, Mucinex, Robitussin, Augmentin -Somewhat wheezing today, placed on Xopenex nebs  Hyperlipidemia - Continue statin, niacin  Hypertension - Currently controlled, placed on oral Coreg,  Constipation Added MiraLAX, suppository  Code Status: Full CODE STATUS   Family Communication: Discussed in detail with the patient, all imaging results, lab results explained to the patient  and wife at the bedside    Disposition  Plan:   Time Spent in minutes 25 minutes  Procedures  None  Consults   Cardiology  DVT Prophylaxis eliquis  Medications  Scheduled Meds: . amoxicillin-clavulanate  1 tablet Oral Q12H  . apixaban  5 mg Oral BID  . aspirin EC  81 mg Oral Daily  . benzonatate  100 mg Oral TID  . carvedilol  6.25 mg Oral BID WC  . cholecalciferol  1,000 Units Oral Daily  . guaiFENesin  1,200 mg Oral BID  . levalbuterol  0.63 mg Nebulization Q6H  . multivitamin with minerals  1 tablet Oral Daily  . niacin  1,000 mg Oral q morning - 10a  . omega-3 acid ethyl esters  1,000 mg Oral Daily  . rosuvastatin  20 mg Oral QHS   Continuous Infusions: . amiodarone 30 mg/hr (08/30/15 0939)   PRN Meds:.acetaminophen **OR** acetaminophen, albuterol, guaiFENesin-dextromethorphan, ondansetron **OR** ondansetron (ZOFRAN) IV, polyethylene glycol,  senna-docusate   Antibiotics   Anti-infectives    Start     Dose/Rate Route Frequency Ordered Stop   08/29/15 1030  amoxicillin-clavulanate (AUGMENTIN) 875-125 MG per tablet 1 tablet     1 tablet Oral Every 12 hours 08/29/15 1017     08/28/15 2315  levofloxacin (LEVAQUIN) IVPB 500 mg  Status:  Discontinued     500 mg 100 mL/hr over 60 Minutes Intravenous  Once 08/28/15 2313 08/29/15 0015        Subjective:   Edgar Thomas was seen and examined today.States cough is improving, no fevers or chills  Patient denies dizziness, chest pain, abdominal pain, N/V/D/C, new weakness, numbess, tingling. No acute events overnight.    Objective:   Filed Vitals:   08/29/15 2100 08/29/15 2357 08/30/15 0154 08/30/15 0538  BP: 153/88   144/89  Pulse: 82   95  Temp: 98.2 F (36.8 C) 99.3 F (37.4 C) 98.7 F (37.1 C) 98.5 F (36.9 C)  TempSrc: Oral Oral Oral Oral  Resp: 18   20  Height:      Weight:      SpO2: 95%   94%    Intake/Output Summary (Last 24 hours) at 08/30/15 1207 Last data filed at 08/30/15 0730  Gross per 24 hour  Intake    720 ml  Output    600 ml  Net    120 ml     Wt Readings from Last 3 Encounters:  08/28/15 73.681 kg (162 lb 7 oz)  03/02/14 70.353 kg (155 lb 1.6 oz)  02/26/14 69.4 kg (153 lb)     Exam  General: Alert and oriented x 3, NAD  HEENT:    Neck:   CVS:S1-S2 clear, regular rate and rhythm  Respiratory: Faint expiratory wheezing bilaterally  Abdomen: Soft, nontender, nondistended, + bowel sounds  Ext: no cyanosis clubbing or edema  Neuro: no new deficits  Skin: No rashes  Psych: Normal affect and demeanor, alert and oriented x3    Data Reviewed:  I have personally reviewed following labs and imaging studies  Micro Results No results found for this or any previous visit (from the past 240 hour(s)).  Radiology Reports Dg Chest 2 View  08/28/2015  CLINICAL DATA:  Cough, atrial fibrillation for 2 days. History of hypertension,  atrial fibrillation, myocardial infarction. EXAM: CHEST  2 VIEW COMPARISON:  Chest x-rays dated 01/08/2014 and 12/07/2013. FINDINGS: Cardiomegaly is stable. Overall cardiomediastinal silhouette is stable in size and configuration. Median sternotomy wires appear intact and stable in alignment. Lungs are hyperexpanded indicating COPD. Suspect associated chronic  bronchitic changes centrally. Lungs are otherwise clear. No evidence of pneumonia. No pleural effusion or pneumothorax seen. No acute- appearing osseous abnormality. IMPRESSION: 1. Hyperexpanded lungs indicating COPD/emphysema. Suspect associated chronic bronchitic changes centrally. 2. No acute findings. No evidence of pneumonia. No evidence of active congestive heart failure. 3. Cardiomegaly is stable. Electronically Signed   By: Bary RichardStan  Maynard M.D.   On: 08/28/2015 23:05    CBC  Recent Labs Lab 08/28/15 2134 08/28/15 2139 08/29/15 0204 08/30/15 0240  WBC  --  6.0 4.0 7.2  HGB 15.0 13.6 12.0* 13.0  HCT 44.0 40.9 35.7* 38.5*  PLT  --  153 134* 133*  MCV  --  93.4 93.0 92.8  MCH  --  31.1 31.3 31.3  MCHC  --  33.3 33.6 33.8  RDW  --  15.3 15.3 15.3    Chemistries   Recent Labs Lab 08/28/15 2134 08/28/15 2203 08/29/15 0020 08/29/15 0204 08/30/15 0240  NA 140 137  --  140 136  K 4.6 4.7  --  4.0 3.9  CL 104 106  --  106 105  CO2  --  23  --  26 22  GLUCOSE 110* 110*  --  115* 107*  BUN 22* 19  --  15 13  CREATININE 0.80 0.81  --  0.79 0.87  CALCIUM  --  9.1  --  8.5* 8.5*  MG  --   --  2.0  --   --    ------------------------------------------------------------------------------------------------------------------ estimated creatinine clearance is 66.4 mL/min (by C-G formula based on Cr of 0.87). ------------------------------------------------------------------------------------------------------------------ No results for input(s): HGBA1C in the last 72  hours. ------------------------------------------------------------------------------------------------------------------ No results for input(s): CHOL, HDL, LDLCALC, TRIG, CHOLHDL, LDLDIRECT in the last 72 hours. ------------------------------------------------------------------------------------------------------------------  Recent Labs  08/29/15 0020  TSH 2.628   ------------------------------------------------------------------------------------------------------------------ No results for input(s): VITAMINB12, FOLATE, FERRITIN, TIBC, IRON, RETICCTPCT in the last 72 hours.  Coagulation profile  Recent Labs Lab 08/28/15 2139  INR 1.35    No results for input(s): DDIMER in the last 72 hours.  Cardiac Enzymes No results for input(s): CKMB, TROPONINI, MYOGLOBIN in the last 168 hours.  Invalid input(s): CK ------------------------------------------------------------------------------------------------------------------ Invalid input(s): POCBNP  No results for input(s): GLUCAP in the last 72 hours.   RAI,RIPUDEEP M.D. [facility] 08/30/2015, 12:07 PM  Pager: [phone] Between 7am to 7pm - call Pager - [phone]  After 7pm go to [email or web address] - password [email or web address]  Call night coverage person covering after 7pm

## 2015-08-30 NOTE — Discharge Summary (Signed)
Physician Discharge Summary   Patient ID: Edgar Thomas MRN: [id number] DOB/AGE: Sep 20, 1929 82 y.o.  Admit date: 08/28/2015 Discharge date: 08/30/2015  Primary Care Physician:  Charletta Cousin, MD  Discharge Diagnoses:    . Atrial fibrillation with RVR ([place]) . Cardiomyopathy, ischemic- EF 40-45% . Bronchitis . CAD- last PCI [facility] yrs ago at [facility]  Consults:  Cardiology  Recommendations for Outpatient Follow-up:  1. The patient was recommended amiodarone 400 mg twice a day for one week then decrease it to 200 mg twice a day, outpatient follow-up with his cardiologist in [place] 2. Please repeat CBC/BMET at next visit   DIET: Heart healthy    Allergies:   Allergies  Allergen Reactions  . Contrast Media [Iodinated Diagnostic Agents] Rash    19 years ago at [facility]  . Other Other (See Comments)    Patient preference not to eat "meat" or "diary"     DISCHARGE MEDICATIONS: Current Discharge Medication List    START taking these medications   Details  amiodarone (PACERONE) 200 MG tablet Take 447m (2 tablets) twice a day for 1 week, then continue 2038m(1 tab) twice a day thereafter Qty: 90 tablet, Refills: 3    amoxicillin-clavulanate (AUGMENTIN) 875-125 MG tablet Take 1 tablet by mouth 2 (two) times daily. Qty: 14 tablet, Refills: 0    benzonatate (TESSALON) 100 MG capsule Take 1 capsule (100 mg total) by mouth 3 (three) times daily as needed for cough. Qty: 45 capsule, Refills: 0    carvedilol (COREG) 6.25 MG tablet Take 1 tablet (6.25 mg total) by mouth 2 (two) times daily with a meal. Qty: 60 tablet, Refills: 3    guaiFENesin (MUCINEX) 600 MG 12 hr tablet Take 2 tablets (1,200 mg total) by mouth 2 (two) times daily. Qty: 60 tablet, Refills: 0    polyethylene glycol (MIRALAX / GLYCOLAX) packet Take 17 g by mouth daily as needed for moderate constipation. Qty: 30 each, Refills: 0      CONTINUE these medications which have NOT CHANGED   Details   azelastine (ASTELIN) 0.1 % nasal spray Place 1 spray into both nostrils daily as needed for rhinitis. Use in each nostril as directed    CRESTOR 20 MG tablet Take 20 mg by mouth at bedtime.  Refills: 3    ELIQUIS 5 MG TABS tablet Take 5 mg by mouth 2 (two) times daily. Refills: 0    Multiple Vitamin (MULTIVITAMIN) capsule Take 1 capsule by mouth daily.    niacin (NIASPAN) 1000 MG CR tablet Take 1,000 mg by mouth every morning.     Omega 3 1000 MG CAPS Take 1,000 mg by mouth daily.     Sodium Chloride-Sodium Bicarb (NETI POT SINUS WASH) 2300-700 MG KIT Place 1 application into the nose daily as needed (for congestion).    Vitamin D, Cholecalciferol, 1000 UNITS TABS Take 1,000 Units by mouth daily.       STOP taking these medications     aspirin EC 81 MG EC tablet      doxycycline (VIBRAMYCIN) 100 MG capsule          Brief H and P: For complete details please refer to admission H and P, but in brief y [name]. male with a past medical history significant for pAF on Eliquis, CHF EF 40% s/p CABG in 2015 who presented with cough. The patient was in his usual state of health until about 2 weeks ago when he developed a sinusitis and was  treated with doxycycline. Around the end of that course of antibiotics he started to feel generalized malaise and weakness, went to his cardiologist in [place], where he was found to be in recurrent A. fib with RVR. He was admitted to the hospital overnight in [place], cardioverted in the morning (last Wednesday), and discharged the same day on Eliquis without rate control agent. Since then he has been fine, able to walk a mile on the treadmill each day. He was however starting to develop cough, and so he came to the ER to get an antibiotic.  In the ED, he was afebrile but tachycardic to the 140s in A. fib. BP 163/137, saturating well on ambient air. Na 137, K 4.7, Cr 0.81, WBC 6K, Hgb 13, troponin negative, INR 1.35. A chest x-ray showed  no focal opacity. ECG showed Afib with RVR. He denied fever, dyspnea, sputum, rigors.  He was given diltiazem bolus, placed on infusion and admitted for further workup. He has a history of Afib, since his NSTEMI in 2015 that preceded his CABG, was on amiodarone and warfarin for a time, but had returned to NSR and was taken off amiodarone, metoprolol and warfarin over a year ago until this week, when his Cardiologist in [place], Dr. Donnal Debar, performed the cardioversion.   Hospital Course:  Atrial fibrillation with RVR: Recently cardioverted on 3/30 -Currently rate controlled, Converted, the patient was initially placed on diltiazem drip. Subsequently cardiology placed patient on amiodarone drip. Patient has converted normal sinus rhythm. At this point he wants to go home. Cardiology recommended to start patient on amiodarone 400 mg twice a day for one week then decrease it down to 200 mg twice a day thereafter. Patient will follow up outpatient with his cardiologist. He was cleared by cardiology to be discharged home. - CHADS vasc 4, continue Eliquis - TSH 2.6   Ischemic CM with EF 40%, CAD:  No echo available since 2015, before CABG. Does exercise 2-3 miles per day, walking, without symptoms.  - Continue statin. -Discontinue aspirin per cardiology as patient is on anticoagulation    Acute on chronic Bronchitis:  -Coughing is mildly improving,. Chest x-ray negative for any pneumonia - Placed on Tessalon, Mucinex, Robitussin, Augmentin -Somewhat wheezing today, placed on Xopenex nebs  Hyperlipidemia - Continue statin, niacin  Hypertension - Currently controlled, placed on oral Coreg,  Constipation Added MiraLAX, suppository  Day of Discharge BP 142/79 mmHg  Pulse 78  Temp(Src) 98.4 F (36.9 C) (Oral)  Resp 18  Ht _0  (1.778 m)  Wt 73.681 kg (162 lb 7 oz)  BMI 23.31 kg/m2  SpO2 98%  Physical Exam: General: Alert and awake oriented x3 not in any acute  distress. HEENT: anicteric sclera, pupils reactive to light and accommodation CVS: S1-S2 clear no murmur rubs or gallops Chest: clear to auscultation bilaterally, no wheezing rales or rhonchi Abdomen: soft nontender, nondistended, normal bowel sounds Extremities: no cyanosis, clubbing or edema noted bilaterally Neuro: Cranial nerves II-XII intact, no focal neurological deficits   The results of significant diagnostics from this hospitalization (including imaging, microbiology, ancillary and laboratory) are listed below for reference.    LAB RESULTS: Basic Metabolic Panel:  Recent Labs Lab 08/29/15 0020 08/29/15 0204 08/30/15 0240  NA  --  140 136  K  --  4.0 3.9  CL  --  106 105  CO2  --  26 22  GLUCOSE  --  115* 107*  BUN  --  15 13  CREATININE  --  0.79 0.87  CALCIUM  --  8.5* 8.5*  MG 2.0  --   --    Liver Function Tests: No results for input(s): AST, ALT, ALKPHOS, BILITOT, PROT, ALBUMIN in the last 168 hours. No results for input(s): LIPASE, AMYLASE in the last 168 hours. No results for input(s): AMMONIA in the last 168 hours. CBC:  Recent Labs Lab 08/29/15 0204 08/30/15 0240  WBC 4.0 7.2  HGB 12.0* 13.0  HCT 35.7* 38.5*  MCV 93.0 92.8  PLT 134* 133*   Cardiac Enzymes: No results for input(s): CKTOTAL, CKMB, CKMBINDEX, TROPONINI in the last 168 hours. BNP: Invalid input(s): POCBNP CBG: No results for input(s): GLUCAP in the last 168 hours.  Significant Diagnostic Studies:  Dg Chest 2 View  08/28/2015  CLINICAL DATA:  Cough, atrial fibrillation for 2 days. History of hypertension, atrial fibrillation, myocardial infarction. EXAM: CHEST  2 VIEW COMPARISON:  Chest x-rays dated 01/08/2014 and 12/07/2013. FINDINGS: Cardiomegaly is stable. Overall cardiomediastinal silhouette is stable in size and configuration. Median sternotomy wires appear intact and stable in alignment. Lungs are hyperexpanded indicating COPD. Suspect associated chronic bronchitic changes  centrally. Lungs are otherwise clear. No evidence of pneumonia. No pleural effusion or pneumothorax seen. No acute- appearing osseous abnormality. IMPRESSION: 1. Hyperexpanded lungs indicating COPD/emphysema. Suspect associated chronic bronchitic changes centrally. 2. No acute findings. No evidence of pneumonia. No evidence of active congestive heart failure. 3. Cardiomegaly is stable. Electronically Signed   By: Franki Cabot M.D.   On: 08/28/2015 23:05    2D ECHO:   Disposition and Follow-up: Discharge Instructions    Diet - low sodium heart healthy    Complete by:  As directed      Increase activity slowly    Complete by:  As directed             DISPOSITION: Home   DISCHARGE FOLLOW-UP Follow-up Information    Follow up with Bernardo Heater, MD. Schedule an appointment as soon as possible for a visit in 10 days.   Specialty:  Internal Medicine   Why:  for hospital follow-up   Contact information:   [place] [address] [phone]       Follow up with [facility], MD. Schedule an appointment as soon as possible for a visit in 10 days.   Specialty:  Family Medicine   Why:  for hospital follow-up       Time spent on Discharge: 25 minutes  Signed:   RAI,RIPUDEEP M.D. [facility] 08/30/2015, 3:52 PM Pager: [phone]

## 2015-08-30 NOTE — Progress Notes (Signed)
Pt/family given discharge instructions, medication lists, follow up appointments, and when to call the doctor.  Pt/family verbalizes understanding. Patient given medication sheets for Eliquis, Amiodarone,and Beta blockers. All questions answered. Thomas HoffBurton, Sherlon Nied McClintock, RN

## 2015-08-31 DIAGNOSIS — I2510 Atherosclerotic heart disease of native coronary artery without angina pectoris: Secondary | ICD-10-CM | POA: Diagnosis not present

## 2015-08-31 DIAGNOSIS — J45901 Unspecified asthma with (acute) exacerbation: Secondary | ICD-10-CM | POA: Diagnosis not present

## 2015-08-31 DIAGNOSIS — J40 Bronchitis, not specified as acute or chronic: Secondary | ICD-10-CM | POA: Diagnosis not present

## 2015-08-31 DIAGNOSIS — Z79899 Other long term (current) drug therapy: Secondary | ICD-10-CM | POA: Diagnosis not present

## 2015-08-31 DIAGNOSIS — I4891 Unspecified atrial fibrillation: Secondary | ICD-10-CM | POA: Diagnosis not present

## 2015-09-03 ENCOUNTER — Observation Stay (HOSPITAL_COMMUNITY)
Admission: EM | Admit: 2015-09-03 | Discharge: 2015-09-05 | Disposition: A | Discharge status: Home / Self Care | Payer: Medicare Other | Attending: Family Medicine | Admitting: Family Medicine

## 2015-09-03 ENCOUNTER — Encounter (HOSPITAL_COMMUNITY): Payer: Self-pay | Admitting: Emergency Medicine

## 2015-09-03 ENCOUNTER — Emergency Department (HOSPITAL_COMMUNITY): Payer: Medicare Other

## 2015-09-03 DIAGNOSIS — I252 Old myocardial infarction: Secondary | ICD-10-CM | POA: Insufficient documentation

## 2015-09-03 DIAGNOSIS — I10 Essential (primary) hypertension: Secondary | ICD-10-CM | POA: Diagnosis not present

## 2015-09-03 DIAGNOSIS — J209 Acute bronchitis, unspecified: Secondary | ICD-10-CM | POA: Diagnosis not present

## 2015-09-03 DIAGNOSIS — J40 Bronchitis, not specified as acute or chronic: Secondary | ICD-10-CM | POA: Diagnosis present

## 2015-09-03 DIAGNOSIS — E860 Dehydration: Secondary | ICD-10-CM | POA: Diagnosis not present

## 2015-09-03 DIAGNOSIS — I255 Ischemic cardiomyopathy: Secondary | ICD-10-CM | POA: Diagnosis present

## 2015-09-03 DIAGNOSIS — E782 Mixed hyperlipidemia: Secondary | ICD-10-CM | POA: Diagnosis present

## 2015-09-03 DIAGNOSIS — R079 Chest pain, unspecified: Secondary | ICD-10-CM

## 2015-09-03 DIAGNOSIS — Z79899 Other long term (current) drug therapy: Secondary | ICD-10-CM | POA: Diagnosis not present

## 2015-09-03 DIAGNOSIS — I2510 Atherosclerotic heart disease of native coronary artery without angina pectoris: Secondary | ICD-10-CM | POA: Diagnosis present

## 2015-09-03 DIAGNOSIS — Z951 Presence of aortocoronary bypass graft: Secondary | ICD-10-CM | POA: Diagnosis not present

## 2015-09-03 DIAGNOSIS — I4891 Unspecified atrial fibrillation: Secondary | ICD-10-CM | POA: Diagnosis not present

## 2015-09-03 DIAGNOSIS — E871 Hypo-osmolality and hyponatremia: Secondary | ICD-10-CM | POA: Diagnosis not present

## 2015-09-03 DIAGNOSIS — E785 Hyperlipidemia, unspecified: Secondary | ICD-10-CM

## 2015-09-03 DIAGNOSIS — K219 Gastro-esophageal reflux disease without esophagitis: Secondary | ICD-10-CM | POA: Diagnosis present

## 2015-09-03 DIAGNOSIS — Z7901 Long term (current) use of anticoagulants: Secondary | ICD-10-CM | POA: Diagnosis not present

## 2015-09-03 DIAGNOSIS — J069 Acute upper respiratory infection, unspecified: Secondary | ICD-10-CM | POA: Diagnosis not present

## 2015-09-03 DIAGNOSIS — R0602 Shortness of breath: Secondary | ICD-10-CM | POA: Diagnosis not present

## 2015-09-03 DIAGNOSIS — J449 Chronic obstructive pulmonary disease, unspecified: Secondary | ICD-10-CM | POA: Insufficient documentation

## 2015-09-03 DIAGNOSIS — K5900 Constipation, unspecified: Secondary | ICD-10-CM | POA: Insufficient documentation

## 2015-09-03 DIAGNOSIS — R55 Syncope and collapse: Principal | ICD-10-CM | POA: Diagnosis present

## 2015-09-03 DIAGNOSIS — R0789 Other chest pain: Secondary | ICD-10-CM | POA: Diagnosis not present

## 2015-09-03 LAB — BASIC METABOLIC PANEL
ANION GAP: 9 (ref 5–15)
Anion gap: 12 (ref 5–15)
Anion gap: 10 (ref 5–15)
Anion gap: 10 (ref 5–15)
Anion gap: 10 (ref 5–15)
Anion gap: 8 (ref 5–15)
BUN: 28 mg/dL — ABNORMAL HIGH (ref 6–20)
BUN: 27 mg/dL — ABNORMAL HIGH (ref 6–20)
BUN: 25 mg/dL — ABNORMAL HIGH (ref 6–20)
BUN: 24 mg/dL — ABNORMAL HIGH (ref 6–20)
BUN: 23 mg/dL — ABNORMAL HIGH (ref 6–20)
BUN: 21 mg/dL — AB (ref 6–20)
CALCIUM: 8.6 mg/dL — AB (ref 8.9–10.3)
CALCIUM: 8.6 mg/dL — AB (ref 8.9–10.3)
CALCIUM: 8.7 mg/dL — AB (ref 8.9–10.3)
CALCIUM: 8.8 mg/dL — AB (ref 8.9–10.3)
CALCIUM: 8.5 mg/dL — AB (ref 8.9–10.3)
CALCIUM: 8.5 mg/dL — AB (ref 8.9–10.3)
CO2: 24 mmol/L (ref 22–32)
CO2: 22 mmol/L (ref 22–32)
CO2: 22 mmol/L (ref 22–32)
CO2: 23 mmol/L (ref 22–32)
CO2: 23 mmol/L (ref 22–32)
CO2: 23 mmol/L (ref 22–32)
CREATININE: 0.99 mg/dL (ref 0.61–1.24)
CREATININE: 0.95 mg/dL (ref 0.61–1.24)
CREATININE: 0.94 mg/dL (ref 0.61–1.24)
CREATININE: 1.02 mg/dL (ref 0.61–1.24)
CREATININE: 0.93 mg/dL (ref 0.61–1.24)
Chloride: 95 mmol/L — ABNORMAL LOW (ref 101–111)
Chloride: 93 mmol/L — ABNORMAL LOW (ref 101–111)
Chloride: 97 mmol/L — ABNORMAL LOW (ref 101–111)
Chloride: 97 mmol/L — ABNORMAL LOW (ref 101–111)
Chloride: 96 mmol/L — ABNORMAL LOW (ref 101–111)
Chloride: 98 mmol/L — ABNORMAL LOW (ref 101–111)
Creatinine, Ser: 1.03 mg/dL (ref 0.61–1.24)
GFR calc Af Amer: >60 mL/min (ref >60)
GFR calc non Af Amer: >60 mL/min (ref >60)
GFR calc non Af Amer: >60 mL/min (ref >60)
GFR calc non Af Amer: >60 mL/min (ref >60)
GFR calc non Af Amer: >60 mL/min (ref >60)
GFR calc non Af Amer: >60 mL/min (ref >60)
GLUCOSE: 122 mg/dL — AB (ref 65–99)
GLUCOSE: 111 mg/dL — AB (ref 65–99)
GLUCOSE: 128 mg/dL — AB (ref 65–99)
Glucose, Bld: 113 mg/dL — ABNORMAL HIGH (ref 65–99)
Glucose, Bld: 138 mg/dL — ABNORMAL HIGH (ref 65–99)
Glucose, Bld: 132 mg/dL — ABNORMAL HIGH (ref 65–99)
Potassium: 4.4 mmol/L (ref 3.5–5.1)
Potassium: 4.4 mmol/L (ref 3.5–5.1)
Potassium: 4.3 mmol/L (ref 3.5–5.1)
Potassium: 4.5 mmol/L (ref 3.5–5.1)
Potassium: 4.5 mmol/L (ref 3.5–5.1)
Potassium: 4.3 mmol/L (ref 3.5–5.1)
SODIUM: 127 mmol/L — AB (ref 135–145)
SODIUM: 129 mmol/L — AB (ref 135–145)
Sodium: 128 mmol/L — ABNORMAL LOW (ref 135–145)
Sodium: 130 mmol/L — ABNORMAL LOW (ref 135–145)
Sodium: 129 mmol/L — ABNORMAL LOW (ref 135–145)
Sodium: 129 mmol/L — ABNORMAL LOW (ref 135–145)

## 2015-09-03 LAB — CBC
HEMATOCRIT: 37.8 % — AB (ref 39.0–52.0)
HEMOGLOBIN: 12.4 g/dL — AB (ref 13.0–17.0)
MCH: 29.9 pg (ref 26.0–34.0)
MCHC: 32.8 g/dL (ref 30.0–36.0)
MCV: 91.1 fL (ref 78.0–100.0)
Platelets: 129 10*3/uL — ABNORMAL LOW (ref 150–400)
RBC: 4.15 MIL/uL — ABNORMAL LOW (ref 4.22–5.81)
RDW: 14.8 % (ref 11.5–15.5)
WBC: 5.8 10*3/uL (ref 4.0–10.5)

## 2015-09-03 LAB — URINALYSIS, ROUTINE W REFLEX MICROSCOPIC
Bilirubin Urine: NEGATIVE
GLUCOSE, UA: NEGATIVE mg/dL
Hgb urine dipstick: NEGATIVE
Ketones, ur: NEGATIVE mg/dL
LEUKOCYTES UA: NEGATIVE
Nitrite: NEGATIVE
PH: 5.5 (ref 5.0–8.0)
PROTEIN: NEGATIVE mg/dL
Specific Gravity, Urine: 1.018 (ref 1.005–1.030)

## 2015-09-03 LAB — OSMOLALITY: Osmolality: 273 mOsm/kg — ABNORMAL LOW (ref 275–295)

## 2015-09-03 LAB — I-STAT TROPONIN, ED: TROPONIN I, POC: 0.01 ng/mL (ref 0.00–0.08)

## 2015-09-03 LAB — OSMOLALITY, URINE: Osmolality, Ur: 469 mOsm/kg (ref 300–900)

## 2015-09-03 LAB — GLUCOSE, CAPILLARY: Glucose-Capillary: 115 mg/dL — ABNORMAL HIGH (ref 65–99)

## 2015-09-03 LAB — SODIUM, URINE, RANDOM: Sodium, Ur: <10 mmol/L

## 2015-09-03 MED ORDER — BENZONATATE 100 MG PO CAPS
100.0000 mg | ORAL_CAPSULE | Freq: Three times a day (TID) | ORAL | Status: DC | PRN
  Administered 2015-09-03 – 2015-09-04 (×2): 100 mg via ORAL
  Filled 2015-09-03 (×2): qty 1

## 2015-09-03 MED ORDER — ASPIRIN EC 81 MG PO TBEC
81.0000 mg | DELAYED_RELEASE_TABLET | Freq: Every day | ORAL | Status: DC
  Administered 2015-09-03 – 2015-09-05 (×3): 81 mg via ORAL
  Filled 2015-09-03 (×3): qty 1

## 2015-09-03 MED ORDER — ONDANSETRON HCL 4 MG/2ML IJ SOLN
4.0000 mg | Freq: Four times a day (QID) | INTRAMUSCULAR | Status: DC | PRN
  Administered 2015-09-04: 4 mg via INTRAVENOUS
  Filled 2015-09-03: qty 2

## 2015-09-03 MED ORDER — AZELASTINE HCL 0.1 % NA SOLN: 1.0000 | Freq: Every day | NASAL | Status: DC | PRN

## 2015-09-03 MED ORDER — NIACIN ER 500 MG PO CPCR
1000.0000 mg | ORAL_CAPSULE | Freq: Every morning | ORAL | Status: DC
  Administered 2015-09-05: 1000 mg via ORAL
  Filled 2015-09-03 (×3): qty 2

## 2015-09-03 MED ORDER — GUAIFENESIN ER 600 MG PO TB12
1200.0000 mg | ORAL_TABLET | Freq: Two times a day (BID) | ORAL | Status: DC
  Administered 2015-09-03: 1200 mg via ORAL
  Filled 2015-09-03: qty 2

## 2015-09-03 MED ORDER — ONDANSETRON HCL 4 MG PO TABS: 4.0000 mg | ORAL_TABLET | Freq: Four times a day (QID) | ORAL | Status: DC | PRN

## 2015-09-03 MED ORDER — ACETAMINOPHEN 325 MG PO TABS: 650.0000 mg | ORAL_TABLET | Freq: Four times a day (QID) | ORAL | Status: DC | PRN

## 2015-09-03 MED ORDER — LEVALBUTEROL HCL 0.63 MG/3ML IN NEBU
0.6300 mg | INHALATION_SOLUTION | Freq: Four times a day (QID) | RESPIRATORY_TRACT | Status: DC
  Administered 2015-09-03 (×3): 0.63 mg via RESPIRATORY_TRACT
  Filled 2015-09-03 (×3): qty 3

## 2015-09-03 MED ORDER — SODIUM CHLORIDE 0.9% FLUSH
3.0000 mL | Freq: Two times a day (BID) | INTRAVENOUS | Status: DC
Start: 2015-09-03 — End: 2015-09-05
  Administered 2015-09-03 (×2): 3 mL via INTRAVENOUS

## 2015-09-03 MED ORDER — AMIODARONE HCL 200 MG PO TABS
400.0000 mg | ORAL_TABLET | Freq: Two times a day (BID) | ORAL | Status: DC
  Administered 2015-09-03 – 2015-09-05 (×5): 400 mg via ORAL
  Filled 2015-09-03 (×5): qty 2

## 2015-09-03 MED ORDER — GUAIFENESIN-DM 100-10 MG/5ML PO SYRP
5.0000 mL | ORAL_SOLUTION | ORAL | Status: DC | PRN
  Administered 2015-09-03: 5 mL via ORAL
  Filled 2015-09-03 (×2): qty 5

## 2015-09-03 MED ORDER — MOMETASONE FURO-FORMOTEROL FUM 200-5 MCG/ACT IN AERO
2.0000 | INHALATION_SPRAY | Freq: Two times a day (BID) | RESPIRATORY_TRACT | Status: DC
  Administered 2015-09-03 – 2015-09-04 (×3): 2 via RESPIRATORY_TRACT
  Filled 2015-09-03: qty 8.8

## 2015-09-03 MED ORDER — SODIUM CHLORIDE 0.9 % IV SOLN
INTRAVENOUS | Status: DC
  Administered 2015-09-03 (×2): via INTRAVENOUS
  Administered 2015-09-04: 100 mL/h via INTRAVENOUS

## 2015-09-03 MED ORDER — APIXABAN 5 MG PO TABS
5.0000 mg | ORAL_TABLET | Freq: Two times a day (BID) | ORAL | Status: DC
Start: 2015-09-03 — End: 2015-09-05
  Administered 2015-09-03 – 2015-09-05 (×5): 5 mg via ORAL
  Filled 2015-09-03 (×6): qty 1

## 2015-09-03 MED ORDER — CARVEDILOL 6.25 MG PO TABS
6.2500 mg | ORAL_TABLET | Freq: Two times a day (BID) | ORAL | Status: DC
  Administered 2015-09-03 – 2015-09-05 (×5): 6.25 mg via ORAL
  Filled 2015-09-03 (×5): qty 1

## 2015-09-03 MED ORDER — LEVALBUTEROL HCL 0.63 MG/3ML IN NEBU
0.6300 mg | INHALATION_SOLUTION | Freq: Three times a day (TID) | RESPIRATORY_TRACT | Status: DC
  Administered 2015-09-04 – 2015-09-05 (×4): 0.63 mg via RESPIRATORY_TRACT
  Filled 2015-09-03 (×4): qty 3

## 2015-09-03 MED ORDER — AMOXICILLIN-POT CLAVULANATE 875-125 MG PO TABS
1.0000 | ORAL_TABLET | Freq: Two times a day (BID) | ORAL | Status: DC
  Administered 2015-09-03 – 2015-09-05 (×5): 1 via ORAL
  Filled 2015-09-03 (×5): qty 1

## 2015-09-03 MED ORDER — POLYETHYLENE GLYCOL 3350 17 G PO PACK: 17.0000 g | PACK | Freq: Every day | ORAL | Status: DC | PRN

## 2015-09-03 MED ORDER — ACETAMINOPHEN 650 MG RE SUPP: 650.0000 mg | Freq: Four times a day (QID) | RECTAL | Status: DC | PRN

## 2015-09-03 MED ORDER — ROSUVASTATIN CALCIUM 20 MG PO TABS
20.0000 mg | ORAL_TABLET | Freq: Every day | ORAL | Status: DC
  Administered 2015-09-03 – 2015-09-04 (×2): 20 mg via ORAL
  Filled 2015-09-03: qty 1
  Filled 2015-09-03: qty 2
  Filled 2015-09-03: qty 1
  Filled 2015-09-03: qty 2

## 2015-09-03 NOTE — ED Notes (Signed)
Attempted report x1. 

## 2015-09-03 NOTE — Progress Notes (Signed)
[facility] PROGRESS NOTE  Edgar Thomas [id number] DOB: 02/11/1930 DOA: 09/03/2015 PCP: Konrad FelixHOMAS,BRAD, MD Interim summary: Edgar Thomas is a 82 y.o. male with Past medical history of dyslipidemia, coronary artery disease status post CABG, atrial fibrillation on anticoagulation, chest hypertension, constipation, GERD, COPD with bronchitis, presents with complaints of cough as well as shortness of breath, fatigue, generalized weakness and dizziness yesterday. On arrival to ED, he was found to be hyponatremic and was admitted for further evaluation.   Assessment/Plan:  Hyponatremia: Probably from dehydration and poor po intake as reported by the patient.  Sodium improved to 130.  Continue gentle hydration.   Rte controlled atrial fibrillation: in sinus.  Resume eliquis.    Ischemic cardiomyopathy: Resume home meds.    Hypertension: Controlled.   Acute on chronic Bronchitis: - resume robitussin and antibiotics.      Code Status: full code.  Family Communication: family at bedside Disposition Plan: pending further investigation.    Consultants:  none  Procedures:  none  Antibiotics:  Amoxicillin.   HPI/Subjective: Reports feeling better and wants to go home.   Objective: Filed Vitals:   09/03/15 0700 09/03/15 0816  BP: 157/95 164/95  Pulse: 67 71  Temp:  97.9 F (36.6 C)  Resp: 19 18   No intake or output data in the 24 hours ending 09/03/15 0932 Filed Weights   09/03/15 0336 09/03/15 0816  Weight: 72.576 kg (160 lb) 72.5 kg (159 lb 13.3 oz)    Exam:   General:  Alert appears tired  Cardiovascular: s1s2  Respiratory: scattered wheezing heard  Abdomen: soft non tender non distended bowel sounds heard.   Musculoskeletal: trace pedal edema.   Data Reviewed: Basic Metabolic Panel:  Recent Labs Lab 08/28/15 2203 08/29/15 0020 08/29/15 0204 08/30/15 0240 09/03/15 0400 09/03/15 0743  NA 137  --  140 136 128* 127*  K 4.7  --  4.0 3.9  4.4 4.4  CL 106  --  106 105 95* 93*  CO2 23  --  26 22 24 22   GLUCOSE 110*  --  115* 107* 122* 113*  BUN 19  --  15 13 28* 27*  CREATININE 0.81  --  0.79 0.87 1.03 0.99  CALCIUM 9.1  --  8.5* 8.5* 8.6* 8.6*  MG  --  2.0  --   --   --   --    Liver Function Tests: No results for input(s): AST, ALT, ALKPHOS, BILITOT, PROT, ALBUMIN in the last 168 hours. No results for input(s): LIPASE, AMYLASE in the last 168 hours. No results for input(s): AMMONIA in the last 168 hours. CBC:  Recent Labs Lab 08/28/15 2134 08/28/15 2139 08/29/15 0204 08/30/15 0240 09/03/15 0400  WBC  --  6.0 4.0 7.2 5.8  HGB 15.0 13.6 12.0* 13.0 12.4*  HCT 44.0 40.9 35.7* 38.5* 37.8*  MCV  --  93.4 93.0 92.8 91.1  PLT  --  153 134* 133* 129*   Cardiac Enzymes: No results for input(s): CKTOTAL, CKMB, CKMBINDEX, TROPONINI in the last 168 hours. BNP (last 3 results)  Recent Labs  08/29/15 0204  BNP 680.2*    ProBNP (last 3 results) No results for input(s): PROBNP in the last 8760 hours.  CBG:  Recent Labs Lab 09/03/15 0825  GLUCAP 115*    No results found for this or any previous visit (from the past 240 hour(s)).   Studies: Dg Chest 2 View  09/03/2015  CLINICAL DATA:  Chest pain, shortness of breath and near syncope  tonight. EXAM: CHEST  2 VIEW COMPARISON:  08/28/2015 FINDINGS: Post median sternotomy. Stable cardiomegaly. No pulmonary edema. Lungs remain hyperinflated with probable emphysema. Chronic bronchitic changes again seen. Mild bibasilar opacities, new, favoring atelectasis. No large pleural effusion. No pneumothorax. IMPRESSION: New bibasilar opacities favoring atelectasis. However given distribution, aspiration could have a similar appearance. Electronically Signed   By: Rubye Oaks M.D.   On: 09/03/2015 04:47    Scheduled Meds: . amiodarone  400 mg Oral BID  . amoxicillin-clavulanate  1 tablet Oral BID  . apixaban  5 mg Oral BID  . aspirin EC  81 mg Oral Daily  . carvedilol   6.25 mg Oral BID WC  . guaiFENesin  1,200 mg Oral BID  . levalbuterol  0.63 mg Nebulization Q6H  . mometasone-formoterol  2 puff Inhalation BID  . niacin  1,000 mg Oral q morning - 10a  . rosuvastatin  20 mg Oral QHS  . sodium chloride flush  3 mL Intravenous Q12H   Continuous Infusions:   Principal Problem:   Hyponatremia Active Problems:   S/P CABG x 4 12/04/13   CAD- last PCI 19 yrs ago at [facility]   GERD (gastroesophageal reflux disease)   Hyperlipemia   Cardiomyopathy, ischemic- EF 40-45%   Bronchitis   Near syncope    Time spent: 20 minutes.     [facility]  [facility] Pager [phone]  If 7PM-7AM, please contact night-coverage at [email or web address], password [facility] 09/03/2015, 9:32 AM

## 2015-09-03 NOTE — ED Notes (Signed)
Patient arrives with complaint of near syncope this AM. Has been suffering with URI and has had recent visits for the same. Yesterday and tonight patient endorses feeling fatigued. Last night patient had several episodes of feeling faint and beginning to sweat. Patient then got into shower, while in shower he became nauseated, and required some time to recover. EMS was called and checked patient but did not transport patient. Family has been tracking blood pressures throughout the day with some readings in the 80s systolic. Per family patient was just started on 2 new blood pressure medications.

## 2015-09-03 NOTE — Progress Notes (Signed)
ANTICOAGULATION CONSULT NOTE - Initial Consult  Pharmacy Consult for Eliquis Indication: atrial fibrillation  Allergies  Allergen Reactions  . Contrast Media [Iodinated Diagnostic Agents] Rash    [facility]  . Other Other (See Comments)    Patient preference not to eat "meat" or "diary"    Patient Measurements: Height: 5\' 10"  (177.8 cm) Weight: 160 lb (72.576 kg) IBW/kg (Calculated) : 73  Vital Signs: Temp: 97.4 F (36.3 C) (04/07 0336) Temp Source: Oral (04/07 0336) BP: 157/95 mmHg (04/07 0700) Pulse Rate: 67 (04/07 0700)  Labs:  Recent Labs  09/03/15 0400  HGB 12.4*  HCT 37.8*  PLT 129*  CREATININE 1.03    Estimated Creatinine Clearance: 55.8 mL/min (by C-G formula based on Cr of 1.03).   Medical History: Past Medical History  Diagnosis Date  . Hyperlipemia   . CAD (coronary artery disease)   . Heartburn   . A-fib (HCC)   . Hypertension   . Heart attack (HCC)   . Constipation   . GERD (gastroesophageal reflux disease)     Medications:  See electronic med rec  Assessment: 82 y.o. M presents with fatigue. Pt on Eliquis PTA for afib. Last dose 4/6 2130. Pharmacy asked to continue upon admission. CBC stable.  Goal of Therapy:  Prevention of CVA Monitor platelets by anticoagulation protocol: Yes   Plan:  Eliquis 5mg  po BID Will f/u CBC, s/s bleeding, and renal function  Christoper Fabianaron Brax Walen, PharmD, BCPS Clinical pharmacist, pager [phone] 09/03/2015,7:25 AM

## 2015-09-03 NOTE — H&P (Signed)
[facility] History and Physical   Patient: Edgar Thomas OXB:[id number]   PCP: Charletta Cousin, MD DOB: 1929/12/20   DOA: 09/03/2015   DOS: 09/03/2015   DOS: the patient was seen and examined on 09/03/2015  Referring physician: Dr. Leonides Schanz Chief Complaint: Fatigue  HPI: Edgar Thomas is a 82 y.o. male with Past medical history of dyslipidemia, coronary artery disease status post CABG, atrial fibrillation on anticoagulation, chest hypertension, constipation, GERD, COPD with bronchitis. The patient presents with complaints of cough as well as shortness of breath as well as fatigue. Patient also had an episode of dizziness yesterday. Denies having any complaints of headache dizziness lightheadedness vertigo or focal deficit on the time of my evaluation. No chest pain no abdominal pain and nausea and vomiting. A burning urination no. Patient has been having progressive cough and persistent. Patient mentions he is compliant with all his medications. She has significantly decreased appetite but denies having any lose weight.  The patient is coming from home.  At his baseline ambulates without any support And is independent for most of his ADL; manages his medication on his own.  Review of Systems: as mentioned in the history of present illness.  A comprehensive review of the other systems is negative.  Past Medical History  Diagnosis Date  . Hyperlipemia   . CAD (coronary artery disease)   . Heartburn   . A-fib ([place])   . Hypertension   . Heart attack ([place])   . Constipation   . GERD (gastroesophageal reflux disease)    Past Surgical History  Procedure Laterality Date  . Balloon angioplasty of lad    . Colonoscopy    . Coronary artery bypass graft N/A 12/03/2013    Procedure: CORONARY ARTERY BYPASS GRAFTING (CABG) x4 using left internal mammary artery and right greater saphenous vein. LIMA to LAD, sequential SVG to OM 1 & OM 2, SVG to PD;  Surgeon: Grace Isaac, MD;  Location: [place];   Service: Open Heart Surgery;  Laterality: N/A;  . Intraoperative transesophageal echocardiogram N/A 12/03/2013    Procedure: INTRAOPERATIVE TRANSESOPHAGEAL ECHOCARDIOGRAM;  Surgeon: Grace Isaac, MD;  Location: [place];  Service: Open Heart Surgery;  Laterality: N/A;  . Endovein harvest of greater saphenous vein Right 12/03/2013    Procedure: ENDOVEIN HARVEST OF GREATER SAPHENOUS VEIN;  Surgeon: Grace Isaac, MD;  Location: [place];  Service: Open Heart Surgery;  Laterality: Right;   Social History:  reports that he has never smoked. He has never used smokeless tobacco. He reports that he does not drink alcohol or use illicit drugs.  Allergies  Allergen Reactions  . Contrast Media [Iodinated Diagnostic Agents] Rash    19 years ago at [facility]  . Other Other (See Comments)    Patient preference not to eat "meat" or "diary"    Family History  Problem Relation Age of Onset  . Heart disease Mother     died at age 73 of heart attack    Prior to Admission medications   Medication Sig Start Date End Date Taking? Authorizing Provider  amiodarone (PACERONE) 200 MG tablet Take '400mg'$  (2 tablets) twice a day for 1 week, then continue '200mg'$  (1 tab) twice a day thereafter 08/30/15  Yes Ripudeep K Rai, MD  amoxicillin-clavulanate (AUGMENTIN) 875-125 MG tablet Take 1 tablet by mouth 2 (two) times daily. 08/30/15  Yes Ripudeep Krystal Eaton, MD  aspirin EC 81 MG tablet Take 81 mg by mouth daily.   Yes Historical Provider, MD  azelastine (ASTELIN) 0.1 % nasal spray Place 1 spray into both nostrils daily as needed for rhinitis. Use in each nostril as directed   Yes Historical Provider, MD  benzonatate (TESSALON) 100 MG capsule Take 1 capsule (100 mg total) by mouth 3 (three) times daily as needed for cough. 08/30/15  Yes Ripudeep Krystal Eaton, MD  carvedilol (COREG) 6.25 MG tablet Take 1 tablet (6.25 mg total) by mouth 2 (two) times daily with a meal. 08/30/15  Yes Ripudeep K Rai, MD  CRESTOR 20 MG tablet Take  20 mg by mouth at bedtime.  08/26/14  Yes Historical Provider, MD  ELIQUIS 5 MG TABS tablet Take 5 mg by mouth 2 (two) times daily. 08/25/15  Yes Historical Provider, MD  guaiFENesin (MUCINEX) 600 MG 12 hr tablet Take 2 tablets (1,200 mg total) by mouth 2 (two) times daily. 08/30/15  Yes Ripudeep Krystal Eaton, MD  Multiple Vitamin (MULTIVITAMIN) capsule Take 1 capsule by mouth daily.   Yes Historical Provider, MD  niacin (NIASPAN) 1000 MG CR tablet Take 1,000 mg by mouth every morning.    Yes Historical Provider, MD  Omega 3 1000 MG CAPS Take 1,000 mg by mouth daily.    Yes Historical Provider, MD  polyethylene glycol (MIRALAX / GLYCOLAX) packet Take 17 g by mouth daily as needed for moderate constipation. 08/30/15  Yes Ripudeep Krystal Eaton, MD  Sodium Chloride-Sodium Bicarb (NETI POT SINUS WASH) 2300-700 MG KIT Place 1 application into the nose daily as needed (for congestion).   Yes Historical Provider, MD  Vitamin D, Cholecalciferol, 1000 UNITS TABS Take 1,000 Units by mouth daily.    Yes Historical Provider, MD    Physical Exam: Filed Vitals:   09/03/15 0336 09/03/15 0400 09/03/15 0501 09/03/15 0530  BP: 129/83 139/83 150/98 153/86  Pulse: 56 58 65 67  Temp: 97.4 F (36.3 C)     TempSrc: Oral     Resp: _0 Height: _1  (1.778 m)     Weight: 72.576 kg (160 lb)     SpO2: 97% 99% 99% 93%    General: Alert, Awake and Oriented to Time, Place and Person. Appear in mild distress Eyes: PERRL ENT: Oral Mucosa clear moist. Neck: no JVD Cardiovascular: S1 and S2 Present, no Murmur, Peripheral Pulses Present Respiratory: Bilateral Air entry equal and Decreased,  no Crackles, bilateral expiratory wheezes Abdomen: Bowel Sound present, Soft and no tenderness Skin: no Rash Extremities: no Pedal edema, no calf tenderness Neurologic: Grossly no focal neuro deficit. Labs on Admission:  CBC:  Recent Labs Lab 08/28/15 2134 08/28/15 2139 08/29/15 0204 08/30/15 0240 09/03/15 0400  WBC  --  6.0 4.0  7.2 5.8  HGB 15.0 13.6 12.0* 13.0 12.4*  HCT 44.0 40.9 35.7* 38.5* 37.8*  MCV  --  93.4 93.0 92.8 91.1  PLT  --  153 134* 133* 129*    CMP     Component Value Date/Time   NA 128* 09/03/2015 0400   K 4.4 09/03/2015 0400   CL 95* 09/03/2015 0400   CO2 24 09/03/2015 0400   GLUCOSE 122* 09/03/2015 0400   BUN 28* 09/03/2015 0400   CREATININE 1.03 09/03/2015 0400   CALCIUM 8.6* 09/03/2015 0400   PROT 7.4 12/01/2013 1410   ALBUMIN 3.6 12/01/2013 1410   AST 36 12/01/2013 1410   ALT 33 12/01/2013 1410   ALKPHOS 70 12/01/2013 1410   BILITOT 0.8 12/01/2013 1410   GFRNONAA >60 09/03/2015 0400   GFRAA >60 09/03/2015 0400  No results for input(s): CKTOTAL, CKMB, CKMBINDEX, TROPONINI in the last 168 hours. BNP (last 3 results)  Recent Labs  08/29/15 0204  BNP 680.2*    ProBNP (last 3 results) No results for input(s): PROBNP in the last 8760 hours.   Radiological Exams on Admission: Dg Chest 2 View  09/03/2015  CLINICAL DATA:  Chest pain, shortness of breath and near syncope tonight. EXAM: CHEST  2 VIEW COMPARISON:  08/28/2015 FINDINGS: Post median sternotomy. Stable cardiomegaly. No pulmonary edema. Lungs remain hyperinflated with probable emphysema. Chronic bronchitic changes again seen. Mild bibasilar opacities, new, favoring atelectasis. No large pleural effusion. No pneumothorax. IMPRESSION: New bibasilar opacities favoring atelectasis. However given distribution, aspiration could have a similar appearance. Electronically Signed   By: Jeb Levering M.D.   On: 09/03/2015 04:47   EKG: Independently reviewed. sinus bradycardia.  Assessment/Plan 1. Hyponatremia The patient presents with numbness of fatigue and tiredness. Sodium level is 128. Patient has significantly poor intake which is most likely a cause. We will check sodium level every 4 hours. Check osmolality of urine and serum, as well as urine sodium Monitor on telemetry.  2. Sinus bradycardia. A. fib drugs as  well. The patient had recent diagnosis of A. fib and currently has sinus bradycardia. heart rate at present Persistently remains in 60s and therefore I would continue his home medication although he may require a reduction in Coreg dose due to his complaint of fatigue.  3. Bronchitis, possible COPD. But persistent bilateral expiratory wheezing. Likely contributing. Continuing nebulizers ad dulera. Continue Augmentin.  4. Coronary artery disease. Continuing home medication.   5. Near syncope. Likely multifactorial. Patient had an episode of NEAR-syncope while he was using NETI POT.  Likely situational and does not appear to be cardiac or neurogenic.  Nutrition: Regular diet DVT Prophylaxis: on therapeutic anticoagulation.  Advance goals of care discussion: DULL CODE   Consults: NONE  Family Communication: family was present at bedside, at the time of interview.  Opportunity was given to ask question and all questions were answered satisfactorily.  Disposition: Admitted as observation, telemetry unit.  Author: Berle Mull, MD [facility] Pager: [phone] 09/03/2015  If 7PM-7AM, please contact night-coverage [email or web address] Password [email or web address]

## 2015-09-03 NOTE — Care Management Obs Status (Signed)
MEDICARE OBSERVATION STATUS NOTIFICATION   Patient Details  Name: Edgar Thomas MRN: [id number] Date of Birth: 05/10/1930   Medicare Observation Status Notification Given:  Yes    Darrold SpanWebster, Tailor Lucking Hall, RN 09/03/2015, 2:55 PM

## 2015-09-03 NOTE — Progress Notes (Addendum)
Was not able to take report due to in the middle of receiving report from off going nurse on floor. When I came to the desk to receive the number to contact the RN for the new admitting patient the patient came. Unaware that LB had taken report just before leaving after talking with me.

## 2015-09-03 NOTE — ED Provider Notes (Signed)
By signing my name below, I, Edgar Thomas, attest that this documentation has been prepared under the direction and in the presence of [employer], DO. Electronically Signed: Helane Thomas, ED Scribe. 09/03/2015. 4:14 AM.  TIME SEEN: 4:03 AM   CHIEF COMPLAINT: Near Syncope   HPI: Edgar Thomas is a 82 y.o. male with a PMHx of A-fib on eliquis, CAD, HTN, and HLD brought by ambulance who presents to the Emergency Department complaining of near syncope that occurred just PTA. Pt states he has been struggling with "a bronchial problem" and that his heart was "out of rhythm" when he was last seen in the ED and prescribed amoxicillin due to concerns of PNA. He reports that tonight he began feeling "clammy." Per relative, pt had just finished using a Netty Pot while crouching on the floor. She notes that when pt rose to stand, he was red and clammy, and began to feel lightheaded and weak. Pt states he then used the restroom and walked over to the shower, but once he started showering, he began to feel nauseated. He notes he was able to make it to a recliner, and his relative called EMS. He reports that she thought he may pass out. Family reports he was pale, diaphoretic. Upon arrival, EMS could not find anything wrong, but pt wanted to come to the ED due to his PMHx. he notes that he did have a feeling of a "bubble" in his chest, similar to his usual feelings with GERD, and reports that this resolved after he drank a glass of water. Per relative, pt's blood pressure and HR were measured multiple times today at 96/51, HR 51, then 98/55, HR 67, tonight at 1 AM 107/55, HR 108. He notes that he was started on Coreg and amiodarone while in the hospital. Pt denies vomiting, diarrhea, bloody stool, melena, HA, and numbness, focal weakness.  ROS: See HPI Constitutional: no fever  Eyes: no drainage  ENT: no runny nose   Cardiovascular:  no chest pain  Resp: no SOB  GI: no vomiting GU: no  dysuria Integumentary: no rash  Allergy: no hives  Musculoskeletal: no leg swelling  Neurological: no slurred speech ROS otherwise negative  PAST MEDICAL HISTORY/PAST SURGICAL HISTORY:  Past Medical History  Diagnosis Date  . Hyperlipemia   . CAD (coronary artery disease)   . Heartburn   . A-fib ([place])   . Hypertension   . Heart attack ([place])   . Constipation   . GERD (gastroesophageal reflux disease)     MEDICATIONS:  Prior to Admission medications   Medication Sig Start Date End Date Taking? Authorizing Provider  amiodarone (PACERONE) 200 MG tablet Take 481m (2 tablets) twice a day for 1 week, then continue 2078m(1 tab) twice a day thereafter 08/30/15   Ripudeep K Krystal EatonMD  amoxicillin-clavulanate (AUGMENTIN) 875-125 MG tablet Take 1 tablet by mouth 2 (two) times daily. 08/30/15   Ripudeep K Krystal EatonMD  azelastine (ASTELIN) 0.1 % nasal spray Place 1 spray into both nostrils daily as needed for rhinitis. Use in each nostril as directed    Historical Provider, MD  benzonatate (TESSALON) 100 MG capsule Take 1 capsule (100 mg total) by mouth 3 (three) times daily as needed for cough. 08/30/15   Ripudeep K Krystal EatonMD  carvedilol (COREG) 6.25 MG tablet Take 1 tablet (6.25 mg total) by mouth 2 (two) times daily with a meal. 08/30/15   Ripudeep K Rai, MD  CRESTOR 20 MG tablet Take 20 mg  by mouth at bedtime.  08/26/14   Historical Provider, MD  ELIQUIS 5 MG TABS tablet Take 5 mg by mouth 2 (two) times daily. 08/25/15   Historical Provider, MD  guaiFENesin (MUCINEX) 600 MG 12 hr tablet Take 2 tablets (1,200 mg total) by mouth 2 (two) times daily. 08/30/15   Ripudeep Krystal Eaton, MD  Multiple Vitamin (MULTIVITAMIN) capsule Take 1 capsule by mouth daily.    Historical Provider, MD  niacin (NIASPAN) 1000 MG CR tablet Take 1,000 mg by mouth every morning.     Historical Provider, MD  Omega 3 1000 MG CAPS Take 1,000 mg by mouth daily.     Historical Provider, MD  polyethylene glycol (MIRALAX / GLYCOLAX) packet Take 17 g  by mouth daily as needed for moderate constipation. 08/30/15   Ripudeep Krystal Eaton, MD  Sodium Chloride-Sodium Bicarb (NETI POT SINUS WASH) 2300-700 MG KIT Place 1 application into the nose daily as needed (for congestion).    Historical Provider, MD  Vitamin D, Cholecalciferol, 1000 UNITS TABS Take 1,000 Units by mouth daily.     Historical Provider, MD    ALLERGIES:  Allergies  Allergen Reactions  . Contrast Media [Iodinated Diagnostic Agents] Rash    [facility] years ago at [facility]  . Other Other (See Comments)    Patient preference not to eat "meat" or "diary"    SOCIAL HISTORY:  Social History  Substance Use Topics  . Smoking status: Never Smoker   . Smokeless tobacco: Never Used  . Alcohol Use: No    FAMILY HISTORY: Family History  Problem Relation Age of Onset  . Heart disease Mother     died at age 74 of heart attack    EXAM: BP 139/83 mmHg  Pulse 58  Temp(Src) 97.4 F (36.3 C) (Oral)  Resp 15  Ht '5\' 10"'  (1.778 m)  Wt 160 lb (72.576 kg)  BMI 22.96 kg/m2  SpO2 99% CONSTITUTIONAL: Alert and oriented and responds appropriately to questions. Well-appearing; well-nourished HEAD: Normocephalic EYES: Conjunctivae clear, PERRL ENT: normal nose; no rhinorrhea; moist mucous membranes NECK: Supple, no meningismus, no LAD  CARD: RRR; S1 and S2 appreciated; no murmurs, no clicks, no rubs, no gallops RESP: Normal chest excursion without splinting or tachypnea; breath sounds clear and equal bilaterally; no wheezes, no rhonchi, no rales, no hypoxia or respiratory distress, speaking full sentences ABD/GI: Normal bowel sounds; non-distended; soft, non-tender, no rebound, no guarding, no peritoneal signs BACK:  The back appears normal and is non-tender to palpation, there is no CVA tenderness EXT: Normal ROM in all joints; non-tender to palpation; no edema; normal capillary refill; no cyanosis, no calf tenderness or swelling    SKIN: Normal color for age and race; warm; no  rash NEURO: Moves all extremities equally, sensation to light touch intact diffusely, cranial nerves II through XII intact PSYCH: The patient's mood and manner are appropriate. Grooming and personal hygiene are appropriate.  MEDICAL DECISION MAKING: Patient here with episode of near-syncope. He has reported history of CHF, atrial fibrillation and CAD. Did have atypical chest pain that has now resolved. Family reports that he appeared pale, diaphoretic. Concern for arrhythmia, ACS. Asymptomatic currently. Is at risk for pulmonary embolus but is on Eliquis. Not tachycardic, tachypneic. This may also be secondary to his recent Coreg. Will obtain orthostatic vital signs. Suspect he will need observation.  ED PROGRESS: Patient's labs show sodium 128. Troponin is negative. Chest x-ray shows bibasilar opacities that are likely atelectasis. He is having a  dry cough but no fever. No leukocytosis. I do not feel he needs to be advised at this time. Patient is still asymptomatic. Not orthostatic. Will discuss with medicine for admission.  5:40 AM  Discussed patient's case with hospitalist, Dr. Posey Pronto.  Recommend admission to tele, obs bed.  I will place holding orders per their request. Patient and family (if present) updated with plan. Care transferred to hospitalist service.     EKG Interpretation  Date/Time:  Friday September 03 2015 03:32:37 EDT Ventricular Rate:  57 PR Interval:  194 QRS Duration: 136 QT Interval:  484 QTC Calculation: 471 R Axis:   -71 Text Interpretation:  Sinus bradycardia Possible Left atrial enlargement Left axis deviation Left ventricular hypertrophy with QRS widening Cannot rule out Septal infarct , age undetermined Abnormal ECG No significant change since last tracing since 2015 Confirmed by Trea Carnegie,  DO, Aymar Whitfill [phone]) on 09/03/2015 4:10:59 AM        I personally performed the services described in this documentation, which was scribed in my presence. The recorded information has  been reviewed and is accurate.   [place], DO 09/03/15 [phone]

## 2015-09-04 DIAGNOSIS — E871 Hypo-osmolality and hyponatremia: Secondary | ICD-10-CM | POA: Diagnosis not present

## 2015-09-04 LAB — BASIC METABOLIC PANEL
Anion gap: 9 (ref 5–15)
Anion gap: 8 (ref 5–15)
Anion gap: 7 (ref 5–15)
BUN: 21 mg/dL — AB (ref 6–20)
BUN: 20 mg/dL (ref 6–20)
BUN: 18 mg/dL (ref 6–20)
CALCIUM: 8.2 mg/dL — AB (ref 8.9–10.3)
CALCIUM: 8.2 mg/dL — AB (ref 8.9–10.3)
CALCIUM: 8 mg/dL — AB (ref 8.9–10.3)
CO2: 22 mmol/L (ref 22–32)
CO2: 22 mmol/L (ref 22–32)
CO2: 23 mmol/L (ref 22–32)
CREATININE: 0.94 mg/dL (ref 0.61–1.24)
Chloride: 98 mmol/L — ABNORMAL LOW (ref 101–111)
Chloride: 98 mmol/L — ABNORMAL LOW (ref 101–111)
Chloride: 99 mmol/L — ABNORMAL LOW (ref 101–111)
Creatinine, Ser: 0.84 mg/dL (ref 0.61–1.24)
Creatinine, Ser: 0.99 mg/dL (ref 0.61–1.24)
GFR calc Af Amer: >60 mL/min (ref >60)
GFR calc Af Amer: >60 mL/min (ref >60)
GFR calc Af Amer: >60 mL/min (ref >60)
GLUCOSE: 135 mg/dL — AB (ref 65–99)
GLUCOSE: 109 mg/dL — AB (ref 65–99)
Glucose, Bld: 113 mg/dL — ABNORMAL HIGH (ref 65–99)
Potassium: 4.2 mmol/L (ref 3.5–5.1)
Potassium: 4.3 mmol/L (ref 3.5–5.1)
Potassium: 4.1 mmol/L (ref 3.5–5.1)
SODIUM: 129 mmol/L — AB (ref 135–145)
Sodium: 128 mmol/L — ABNORMAL LOW (ref 135–145)
Sodium: 129 mmol/L — ABNORMAL LOW (ref 135–145)

## 2015-09-04 LAB — GLUCOSE, CAPILLARY: GLUCOSE-CAPILLARY: 116 mg/dL — AB (ref 65–99)

## 2015-09-04 MED ORDER — ZOLPIDEM TARTRATE 5 MG PO TABS
5.0000 mg | ORAL_TABLET | ORAL | Status: DC | PRN
  Administered 2015-09-04: 2.5 mg via ORAL
  Administered 2015-09-04: 5 mg via ORAL
  Filled 2015-09-04 (×2): qty 1

## 2015-09-04 MED ORDER — LEVALBUTEROL HCL 0.63 MG/3ML IN NEBU
0.6300 mg | INHALATION_SOLUTION | Freq: Four times a day (QID) | RESPIRATORY_TRACT | Status: DC | PRN
  Administered 2015-09-04 – 2015-09-05 (×2): 0.63 mg via RESPIRATORY_TRACT
  Filled 2015-09-04 (×2): qty 3

## 2015-09-04 NOTE — Discharge Instructions (Signed)

## 2015-09-04 NOTE — Progress Notes (Signed)
[facility] PROGRESS NOTE  Edgar Thomas ZOX:[id number] DOB: 12-01-1929 DOA: 09/03/2015 PCP: Konrad Felix, MD Interim summary: Edgar Thomas is a 82 y.o. male with Past medical history of dyslipidemia, coronary artery disease status post CABG, atrial fibrillation on anticoagulation, chest hypertension, constipation, GERD, COPD with bronchitis, presents with complaints of cough as well as shortness of breath, fatigue, generalized weakness and dizziness yesterday. On arrival to ED, he was found to be hyponatremic and was admitted for further evaluation.   Assessment/Plan:  Hyponatremia: - Most likely secondary to dehydration and poor po intake as reported by the patient.  - sodium still low and most likely due to poor oral solute intake. Will increase IVF rate.  Rte controlled atrial fibrillation: in sinus.  Resume eliquis.  On amiodarone, carvedilol  Ischemic cardiomyopathy: Resume home meds.   Hypertension: - continue carvedilol and continue monitoring blood pressures.  Acute on chronic Bronchitis: - resume robitussin and antibiotics.  - continue supportive therapy   Code Status: full code.  Family Communication: family at bedside Disposition Plan: pending improvement in sodium levels   Consultants:  none  Procedures:  none  Antibiotics:  Amoxicillin.   HPI/Subjective: Pt reports that he would like to take something now to help him sleep. Open to low dose ambien. States he hasn't slept well in several days.  Objective: Filed Vitals:   09/04/15 0700 09/04/15 0851  BP: 184/103 163/98  Pulse: 75 70  Temp: 97.7 F (36.5 C) 97.7 F (36.5 C)  Resp: 20     Intake/Output Summary (Last 24 hours) at 09/04/15 0928 Last data filed at 09/04/15 0045  Gross per 24 hour  Intake    600 ml  Output    325 ml  Net    275 ml   Filed Weights   09/03/15 0336 09/03/15 0816 09/04/15 0517  Weight: 72.576 kg (160 lb) 72.5 kg (159 lb 13.3 oz) 74.254 kg (163 lb 11.2 oz)     Exam:   General:  Alert and awake  Cardiovascular: s1s2, no rubs  Respiratory: scattered expiratory wheezes (mild), some rhales  Abdomen: soft non tender non distended, bowel sounds heard.   Musculoskeletal: no cyanosis  Data Reviewed: Basic Metabolic Panel:  Recent Labs Lab 08/29/15 0020  09/03/15 1037 09/03/15 1508 09/03/15 1906 09/03/15 2245 09/04/15 0421  NA  --   < > 129* 130* 129* 129* 129*  K  --   < > 4.3 4.5 4.5 4.3 4.2  CL  --   < > 97* 97* 96* 98* 98*  CO2  --   < > GLUCOSE  --   < > 138* 132* 111* 128* 113*  BUN  --   < > 25* 24* 23* 21* 21*  CREATININE  --   < > 0.95 0.94 1.02 0.93 0.94  CALCIUM  --   < > 8.7* 8.8* 8.5* 8.5* 8.2*  MG 2.0  --   --   --   --   --   --   < > = values in this interval not displayed. Liver Function Tests: No results for input(s): AST, ALT, ALKPHOS, BILITOT, PROT, ALBUMIN in the last 168 hours. No results for input(s): LIPASE, AMYLASE in the last 168 hours. No results for input(s): AMMONIA in the last 168 hours. CBC:  Recent Labs Lab 08/28/15 2134 08/28/15 2139 08/29/15 0204 08/30/15 0240 09/03/15 0400  WBC  --  6.0 4.0 7.2 5.8  HGB 15.0 13.6 12.0* 13.0 12.4*  HCT 44.0 40.9 35.7* 38.5* 37.8*  MCV  --  93.4 93.0 92.8 91.1  PLT  --  153 134* 133* 129*   Cardiac Enzymes: No results for input(s): CKTOTAL, CKMB, CKMBINDEX, TROPONINI in the last 168 hours. BNP (last 3 results)  Recent Labs  08/29/15 0204  BNP 680.2*    ProBNP (last 3 results) No results for input(s): PROBNP in the last 8760 hours.  CBG:  Recent Labs Lab 09/03/15 0825 09/04/15 0618  GLUCAP 115* 116*    No results found for this or any previous visit (from the past 240 hour(s)).   Studies: Dg Chest 2 View  09/03/2015  CLINICAL DATA:  Chest pain, shortness of breath and near syncope tonight. EXAM: CHEST  2 VIEW COMPARISON:  08/28/2015 FINDINGS: Post median sternotomy. Stable cardiomegaly. No pulmonary edema. Lungs  remain hyperinflated with probable emphysema. Chronic bronchitic changes again seen. Mild bibasilar opacities, new, favoring atelectasis. No large pleural effusion. No pneumothorax. IMPRESSION: New bibasilar opacities favoring atelectasis. However given distribution, aspiration could have a similar appearance. Electronically Signed   By: Rubye OaksMelanie  Ehinger M.D.   On: 09/03/2015 04:47    Scheduled Meds: . amiodarone  400 mg Oral BID  . amoxicillin-clavulanate  1 tablet Oral BID  . apixaban  5 mg Oral BID  . aspirin EC  81 mg Oral Daily  . carvedilol  6.25 mg Oral BID WC  . levalbuterol  0.63 mg Nebulization TID  . mometasone-formoterol  2 puff Inhalation BID  . niacin  1,000 mg Oral q morning - 10a  . rosuvastatin  20 mg Oral QHS  . sodium chloride flush  3 mL Intravenous Q12H   Continuous Infusions: . sodium chloride 50 mL/hr at 09/03/15 2106    Principal Problem:   Hyponatremia Active Problems:   S/P CABG x 4 12/04/13   [facility]   GERD (gastroesophageal reflux disease)   Hyperlipemia   Cardiomyopathy, ischemic- EF 40-45%   Bronchitis   Near syncope    Time spent: 25 minutes.     Penny PiaVEGA, Manraj Yeo  [facility] Pager [phone]  If 7PM-7AM, please contact night-coverage at [email or web address], password [email or web address] 09/04/2015, 9:28 AM

## 2015-09-05 DIAGNOSIS — E871 Hypo-osmolality and hyponatremia: Secondary | ICD-10-CM | POA: Diagnosis not present

## 2015-09-05 LAB — BASIC METABOLIC PANEL
Anion gap: 7 (ref 5–15)
BUN: 18 mg/dL (ref 6–20)
CALCIUM: 8.1 mg/dL — AB (ref 8.9–10.3)
CHLORIDE: 101 mmol/L (ref 101–111)
CO2: 22 mmol/L (ref 22–32)
CREATININE: 0.93 mg/dL (ref 0.61–1.24)
Glucose, Bld: 105 mg/dL — ABNORMAL HIGH (ref 65–99)
Potassium: 4.5 mmol/L (ref 3.5–5.1)
SODIUM: 130 mmol/L — AB (ref 135–145)

## 2015-09-05 LAB — GLUCOSE, CAPILLARY: GLUCOSE-CAPILLARY: 116 mg/dL — AB (ref 65–99)

## 2015-09-05 NOTE — Discharge Summary (Signed)
Physician Discharge Summary  Edgar Thomas IOE:[id number] DOB: 06-03-1929 DOA: 09/03/2015  PCP: Charletta Cousin, MD  Admit date: 09/03/2015 Discharge date: 09/05/2015  Time spent: > 35 minutes  Recommendations for Outpatient Follow-up:  1. Monitor sodium levels 2. 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   GERD (gastroesophageal reflux disease)   Hyperlipemia   Cardiomyopathy, ischemic- EF 40-45%   Bronchitis   Near syncope   Discharge Condition: stable   Diet recommendation: regular diet  Filed Weights   09/03/15 0816 09/04/15 0517 09/05/15 0528  Weight: 72.5 kg (159 lb 13.3 oz) 74.254 kg (163 lb 11.2 oz) 75.388 kg (166 lb 3.2 oz)    History of present illness:  From original HPI: 82 y.o. male with Past medical history of dyslipidemia, coronary artery disease status post CABG, atrial fibrillation on anticoagulation, chest hypertension, constipation, GERD, COPD with bronchitis. The patient presents with complaints of cough as well as shortness of breath as well as fatigue. Patient also had an episode of dizziness.  Pt told by his pcp that he had a viral infection and was found to have low sodium levels.  Hospital Course:  URI - viral most likely. Most likely contributing to poor oral intake with subsequent dehydration  Dehydration - resolved after IVF rehydration  Hyponatremia - most likely due to poor oral solute intake - recommended increasing his sodium in his diet  Procedures:  None  Consultations:  None  Discharge Exam: Filed Vitals:   09/05/15 0528 09/05/15 0800  BP: 137/82 149/92  Pulse: 55 58  Temp: 98.1 F (36.7 C)   Resp: 18     General: Pt in nad, alert and awake Cardiovascular: rrr, no mrg Respiratory: cta bl, no wheezes  Discharge Instructions   Discharge Instructions     Call MD for:  severe uncontrolled pain    Complete by:  As directed      Call MD for:  temperature >100.4    Complete by:  As directed      Diet - low sodium heart healthy    Complete by:  As directed      Discharge instructions    Complete by:  As directed   Recommend you follow up with your primary care physician in 1-2 weeks or sooner should any concerns arise.     Increase activity slowly    Complete by:  As directed           Current Discharge Medication List    CONTINUE these medications which have NOT CHANGED   Details  amiodarone (PACERONE) 200 MG tablet Take 428m (2 tablets) twice a day for 1 week, then continue 2055m(1 tab) twice a day thereafter Qty: 90 tablet, Refills: 3    amoxicillin-clavulanate (AUGMENTIN) 875-125 MG tablet Take 1 tablet by mouth 2 (two) times daily. Qty: 14 tablet, Refills: 0    benzonatate (TESSALON) 100 MG capsule Take 1 capsule (100 mg total) by mouth 3 (three) times daily as needed for cough. Qty: 45 capsule, Refills: 0    carvedilol (COREG) 6.25 MG tablet Take 1 tablet (6.25 mg total) by mouth 2 (two) times daily with a meal. Qty: 60 tablet, Refills: 3    CRESTOR 20 MG tablet Take 20 mg by mouth at bedtime.  Refills: 3    ELIQUIS 5 MG TABS tablet Take 5  mg by mouth 2 (two) times daily. Refills: 0    guaiFENesin (MUCINEX) 600 MG 12 hr tablet Take 2 tablets (1,200 mg total) by mouth 2 (two) times daily. Qty: 60 tablet, Refills: 0    Multiple Vitamin (MULTIVITAMIN) capsule Take 1 capsule by mouth daily.    niacin (NIASPAN) 1000 MG CR tablet Take 1,000 mg by mouth every morning.     Omega 3 1000 MG CAPS Take 1,000 mg by mouth daily.     polyethylene glycol (MIRALAX / GLYCOLAX) packet Take 17 g by mouth daily as needed for moderate constipation. Qty: 30 each, Refills: 0    Sodium Chloride-Sodium Bicarb (NETI POT SINUS WASH) 2300-700 MG KIT Place 1 application into the nose daily as needed (for congestion).    Vitamin D,  Cholecalciferol, 1000 UNITS TABS Take 1,000 Units by mouth daily.       STOP taking these medications     aspirin EC 81 MG tablet      azelastine (ASTELIN) 0.1 % nasal spray        Allergies  Allergen Reactions  . Contrast Media [Iodinated Diagnostic Agents] Rash    [facility] years ago at [facility]  . Other Other (See Comments)    Patient preference not to eat "meat" or "diary"      The results of significant diagnostics from this hospitalization (including imaging, microbiology, ancillary and laboratory) are listed below for reference.    Significant Diagnostic Studies: Dg Chest 2 View  09/03/2015  CLINICAL DATA:  Chest pain, shortness of breath and near syncope tonight. EXAM: CHEST  2 VIEW COMPARISON:  08/28/2015 FINDINGS: Post median sternotomy. Stable cardiomegaly. No pulmonary edema. Lungs remain hyperinflated with probable emphysema. Chronic bronchitic changes again seen. Mild bibasilar opacities, new, favoring atelectasis. No large pleural effusion. No pneumothorax. IMPRESSION: New bibasilar opacities favoring atelectasis. However given distribution, aspiration could have a similar appearance. Electronically Signed   By: Jeb Levering M.D.   On: 09/03/2015 04:47   Dg Chest 2 View  08/28/2015  CLINICAL DATA:  Cough, atrial fibrillation for 2 days. History of hypertension, atrial fibrillation, myocardial infarction. EXAM: CHEST  2 VIEW COMPARISON:  Chest x-rays dated 01/08/2014 and 12/07/2013. FINDINGS: Cardiomegaly is stable. Overall cardiomediastinal silhouette is stable in size and configuration. Median sternotomy wires appear intact and stable in alignment. Lungs are hyperexpanded indicating COPD. Suspect associated chronic bronchitic changes centrally. Lungs are otherwise clear. No evidence of pneumonia. No pleural effusion or pneumothorax seen. No acute- appearing osseous abnormality. IMPRESSION: 1. Hyperexpanded lungs indicating COPD/emphysema. Suspect associated  chronic bronchitic changes centrally. 2. No acute findings. No evidence of pneumonia. No evidence of active congestive heart failure. 3. Cardiomegaly is stable. Electronically Signed   By: Franki Cabot M.D.   On: 08/28/2015 23:05    Microbiology: No results found for this or any previous visit (from the past 240 hour(s)).   Labs: Basic Metabolic Panel:  Recent Labs Lab 09/03/15 2245 09/04/15 0421 09/04/15 1104 09/04/15 2123 09/05/15 0948  NA 129* 129* 128* 129* 130*  K 4.3 4.2 4.3 4.1 4.5  CL 98* 98* 98* 99* 101  CO2 '23 22 22 23 22  ' GLUCOSE 128* 113* 135* 109* 105*  BUN 21* 21* '20 18 18  ' CREATININE 0.93 0.94 0.84 0.99 0.93  CALCIUM 8.5* 8.2* 8.2* 8.0* 8.1*   Liver Function Tests: No results for input(s): AST, ALT, ALKPHOS, BILITOT, PROT, ALBUMIN in the last 168 hours. No results for input(s): LIPASE, AMYLASE in the last 168  hours. No results for input(s): AMMONIA in the last 168 hours. CBC:  Recent Labs Lab 08/30/15 0240 09/03/15 0400  WBC 7.2 5.8  HGB 13.0 12.4*  HCT 38.5* 37.8*  MCV 92.8 91.1  PLT 133* 129*   Cardiac Enzymes: No results for input(s): CKTOTAL, CKMB, CKMBINDEX, TROPONINI in the last 168 hours. BNP: BNP (last 3 results)  Recent Labs  08/29/15 0204  BNP 680.2*    ProBNP (last 3 results) No results for input(s): PROBNP in the last 8760 hours.  CBG:  Recent Labs Lab 09/03/15 0825 09/04/15 0618 09/05/15 0605  GLUCAP 115* 116* 116*    Signed:  Velvet Bathe MD.  [facility] 09/05/2015, 12:07 PM

## 2015-09-06 DIAGNOSIS — E871 Hypo-osmolality and hyponatremia: Secondary | ICD-10-CM | POA: Diagnosis not present

## 2015-09-06 DIAGNOSIS — R05 Cough: Secondary | ICD-10-CM | POA: Diagnosis not present

## 2015-09-06 DIAGNOSIS — J90 Pleural effusion, not elsewhere classified: Secondary | ICD-10-CM | POA: Diagnosis not present

## 2015-09-06 DIAGNOSIS — I509 Heart failure, unspecified: Secondary | ICD-10-CM | POA: Diagnosis not present

## 2015-09-06 DIAGNOSIS — R531 Weakness: Secondary | ICD-10-CM | POA: Diagnosis not present

## 2015-09-06 DIAGNOSIS — I517 Cardiomegaly: Secondary | ICD-10-CM | POA: Diagnosis not present

## 2015-09-13 DIAGNOSIS — J40 Bronchitis, not specified as acute or chronic: Secondary | ICD-10-CM | POA: Diagnosis not present

## 2015-09-13 DIAGNOSIS — I509 Heart failure, unspecified: Secondary | ICD-10-CM | POA: Diagnosis not present

## 2015-09-13 DIAGNOSIS — B370 Candidal stomatitis: Secondary | ICD-10-CM | POA: Diagnosis not present

## 2015-09-13 DIAGNOSIS — I2510 Atherosclerotic heart disease of native coronary artery without angina pectoris: Secondary | ICD-10-CM | POA: Diagnosis not present

## 2015-09-13 DIAGNOSIS — Z9189 Other specified personal risk factors, not elsewhere classified: Secondary | ICD-10-CM | POA: Diagnosis not present

## 2015-09-13 DIAGNOSIS — J029 Acute pharyngitis, unspecified: Secondary | ICD-10-CM | POA: Diagnosis not present

## 2015-09-13 DIAGNOSIS — I4891 Unspecified atrial fibrillation: Secondary | ICD-10-CM | POA: Diagnosis not present

## 2015-09-20 DIAGNOSIS — I10 Essential (primary) hypertension: Secondary | ICD-10-CM | POA: Diagnosis not present

## 2015-09-20 DIAGNOSIS — I509 Heart failure, unspecified: Secondary | ICD-10-CM | POA: Diagnosis not present

## 2015-09-20 DIAGNOSIS — I4891 Unspecified atrial fibrillation: Secondary | ICD-10-CM | POA: Diagnosis not present

## 2015-09-21 DIAGNOSIS — Z0000 Encounter for general adult medical examination without abnormal findings: Secondary | ICD-10-CM | POA: Diagnosis not present

## 2015-09-21 DIAGNOSIS — Z125 Encounter for screening for malignant neoplasm of prostate: Secondary | ICD-10-CM | POA: Diagnosis not present

## 2015-09-21 DIAGNOSIS — E782 Mixed hyperlipidemia: Secondary | ICD-10-CM | POA: Diagnosis not present

## 2015-09-21 DIAGNOSIS — I119 Hypertensive heart disease without heart failure: Secondary | ICD-10-CM | POA: Diagnosis not present

## 2015-09-21 DIAGNOSIS — Z79899 Other long term (current) drug therapy: Secondary | ICD-10-CM | POA: Diagnosis not present

## 2015-10-04 DIAGNOSIS — E782 Mixed hyperlipidemia: Secondary | ICD-10-CM | POA: Diagnosis not present

## 2015-10-04 DIAGNOSIS — E785 Hyperlipidemia, unspecified: Secondary | ICD-10-CM | POA: Diagnosis not present

## 2015-10-04 DIAGNOSIS — Z136 Encounter for screening for cardiovascular disorders: Secondary | ICD-10-CM | POA: Diagnosis not present

## 2015-10-04 DIAGNOSIS — Z125 Encounter for screening for malignant neoplasm of prostate: Secondary | ICD-10-CM | POA: Diagnosis not present

## 2015-10-04 DIAGNOSIS — Z0000 Encounter for general adult medical examination without abnormal findings: Secondary | ICD-10-CM | POA: Diagnosis not present

## 2015-10-04 DIAGNOSIS — Z1389 Encounter for screening for other disorder: Secondary | ICD-10-CM | POA: Diagnosis not present

## 2015-10-04 DIAGNOSIS — Z9181 History of falling: Secondary | ICD-10-CM | POA: Diagnosis not present

## 2015-10-04 DIAGNOSIS — Z1211 Encounter for screening for malignant neoplasm of colon: Secondary | ICD-10-CM | POA: Diagnosis not present

## 2015-10-06 ENCOUNTER — Ambulatory Visit: Payer: Medicare Other | Admitting: Podiatry

## 2015-10-19 ENCOUNTER — Ambulatory Visit (INDEPENDENT_AMBULATORY_CARE_PROVIDER_SITE_OTHER): Payer: Medicare Other | Admitting: Podiatry

## 2015-10-19 ENCOUNTER — Encounter: Payer: Self-pay | Admitting: Podiatry

## 2015-10-19 DIAGNOSIS — M79676 Pain in unspecified toe(s): Secondary | ICD-10-CM | POA: Diagnosis not present

## 2015-10-19 DIAGNOSIS — B351 Tinea unguium: Secondary | ICD-10-CM

## 2015-10-19 NOTE — Patient Instructions (Signed)
For the dry scaly skin consistent with fungal infection your skin okay to use year over-the-counter Lamisil cream daily 30 days

## 2015-10-19 NOTE — Progress Notes (Signed)
Patient ID: Edgar ChurchBilly Thomas, male   DOB: 06/13/1929, 82 y.o.   MRN: [id number]  Subjective: This patient presents for a scheduled visit complaining of elongated uncomfortable toenails when walking wearing shoes and is requesting nail debridement Patient has over-the-counter Lamisil cream and combination of antifungal and cortisone cream requesting advice and use of medication  Objective: Pleasant orientated 3 Dry scaling skin bilaterally No open skin lesions bilaterally The toenails are incurvated, elongated, discolored, deformed and tender direct palpation 6-10  Assessment: Symptomatic onychomycoses 6-10 Tinea pedis bilaterally, right greater than left  Plan: Debridement of toenails 6-10 mechanically and electrically without a bleeding Advised patient to use over-the-counter Lamisil cream once daily 30 days to dry scaling skin

## 2015-11-11 DIAGNOSIS — I2510 Atherosclerotic heart disease of native coronary artery without angina pectoris: Secondary | ICD-10-CM | POA: Diagnosis not present

## 2015-11-11 DIAGNOSIS — R42 Dizziness and giddiness: Secondary | ICD-10-CM | POA: Diagnosis not present

## 2015-11-11 DIAGNOSIS — I4891 Unspecified atrial fibrillation: Secondary | ICD-10-CM | POA: Diagnosis not present

## 2015-11-24 DIAGNOSIS — I4891 Unspecified atrial fibrillation: Secondary | ICD-10-CM | POA: Diagnosis not present

## 2015-11-24 DIAGNOSIS — R42 Dizziness and giddiness: Secondary | ICD-10-CM | POA: Diagnosis not present

## 2015-12-03 DIAGNOSIS — L570 Actinic keratosis: Secondary | ICD-10-CM | POA: Diagnosis not present

## 2015-12-03 DIAGNOSIS — L853 Xerosis cutis: Secondary | ICD-10-CM | POA: Diagnosis not present

## 2015-12-03 DIAGNOSIS — L821 Other seborrheic keratosis: Secondary | ICD-10-CM | POA: Diagnosis not present

## 2016-01-11 ENCOUNTER — Ambulatory Visit (INDEPENDENT_AMBULATORY_CARE_PROVIDER_SITE_OTHER): Payer: Medicare Other | Admitting: Podiatry

## 2016-01-11 ENCOUNTER — Encounter: Payer: Self-pay | Admitting: Podiatry

## 2016-01-11 DIAGNOSIS — M79676 Pain in unspecified toe(s): Secondary | ICD-10-CM | POA: Diagnosis not present

## 2016-01-11 DIAGNOSIS — B351 Tinea unguium: Secondary | ICD-10-CM

## 2016-01-11 NOTE — Progress Notes (Signed)
Patient ID: Edgar Thomas, male   DOB: 12/16/1929, 82 y.o.   MRN: [id number]    Subjective: This patient presents for a scheduled visit complaining of elongated uncomfortable toenails when walking wearing shoes and is requesting nail debridement  Objective: Pleasant orientated 3 No open skin lesions bilaterally Dorsal left foot has some circular macular areas The toenails are incurvated, elongated, discolored, deformed and tender direct palpation 6-10  Assessment: Symptomatic onychomycoses 6-10 Tinea pedis dorsal left foot  Plan: Debridement toenails 6-10 mechanically and electrically without any bleeding  Chest use of topical antifungal cream to the dorsal left foot daily 30 days  Reappoint 3 months

## 2016-02-09 DIAGNOSIS — R260 Ataxic gait: Secondary | ICD-10-CM | POA: Diagnosis not present

## 2016-02-09 DIAGNOSIS — I4891 Unspecified atrial fibrillation: Secondary | ICD-10-CM | POA: Diagnosis not present

## 2016-02-09 DIAGNOSIS — I2510 Atherosclerotic heart disease of native coronary artery without angina pectoris: Secondary | ICD-10-CM | POA: Diagnosis not present

## 2016-02-17 DIAGNOSIS — I509 Heart failure, unspecified: Secondary | ICD-10-CM | POA: Diagnosis not present

## 2016-02-17 DIAGNOSIS — R404 Transient alteration of awareness: Secondary | ICD-10-CM | POA: Diagnosis not present

## 2016-02-17 DIAGNOSIS — I5042 Chronic combined systolic (congestive) and diastolic (congestive) heart failure: Secondary | ICD-10-CM | POA: Insufficient documentation

## 2016-02-17 DIAGNOSIS — R42 Dizziness and giddiness: Secondary | ICD-10-CM | POA: Diagnosis not present

## 2016-02-17 DIAGNOSIS — Z79899 Other long term (current) drug therapy: Secondary | ICD-10-CM | POA: Diagnosis not present

## 2016-02-17 DIAGNOSIS — G459 Transient cerebral ischemic attack, unspecified: Secondary | ICD-10-CM | POA: Diagnosis not present

## 2016-02-17 DIAGNOSIS — R269 Unspecified abnormalities of gait and mobility: Secondary | ICD-10-CM | POA: Insufficient documentation

## 2016-02-17 DIAGNOSIS — I480 Paroxysmal atrial fibrillation: Secondary | ICD-10-CM | POA: Diagnosis not present

## 2016-02-17 DIAGNOSIS — I5043 Acute on chronic combined systolic (congestive) and diastolic (congestive) heart failure: Secondary | ICD-10-CM | POA: Insufficient documentation

## 2016-02-17 DIAGNOSIS — R262 Difficulty in walking, not elsewhere classified: Secondary | ICD-10-CM | POA: Diagnosis not present

## 2016-02-17 DIAGNOSIS — R531 Weakness: Secondary | ICD-10-CM | POA: Diagnosis not present

## 2016-02-17 DIAGNOSIS — R2689 Other abnormalities of gait and mobility: Secondary | ICD-10-CM | POA: Diagnosis not present

## 2016-02-17 DIAGNOSIS — G450 Vertebro-basilar artery syndrome: Secondary | ICD-10-CM | POA: Diagnosis not present

## 2016-02-17 DIAGNOSIS — R270 Ataxia, unspecified: Secondary | ICD-10-CM | POA: Diagnosis not present

## 2016-02-17 DIAGNOSIS — E785 Hyperlipidemia, unspecified: Secondary | ICD-10-CM | POA: Diagnosis not present

## 2016-02-17 DIAGNOSIS — H903 Sensorineural hearing loss, bilateral: Secondary | ICD-10-CM | POA: Diagnosis not present

## 2016-02-17 DIAGNOSIS — I2510 Atherosclerotic heart disease of native coronary artery without angina pectoris: Secondary | ICD-10-CM | POA: Diagnosis not present

## 2016-02-17 DIAGNOSIS — I4891 Unspecified atrial fibrillation: Secondary | ICD-10-CM | POA: Diagnosis not present

## 2016-02-17 DIAGNOSIS — Z7982 Long term (current) use of aspirin: Secondary | ICD-10-CM | POA: Diagnosis not present

## 2016-02-17 DIAGNOSIS — I63523 Cerebral infarction due to unspecified occlusion or stenosis of bilateral anterior cerebral arteries: Secondary | ICD-10-CM | POA: Diagnosis not present

## 2016-02-17 DIAGNOSIS — N390 Urinary tract infection, site not specified: Secondary | ICD-10-CM | POA: Diagnosis not present

## 2016-02-18 DIAGNOSIS — G450 Vertebro-basilar artery syndrome: Secondary | ICD-10-CM | POA: Diagnosis not present

## 2016-02-18 DIAGNOSIS — N390 Urinary tract infection, site not specified: Secondary | ICD-10-CM | POA: Insufficient documentation

## 2016-03-01 DIAGNOSIS — Z9189 Other specified personal risk factors, not elsewhere classified: Secondary | ICD-10-CM | POA: Diagnosis not present

## 2016-03-01 DIAGNOSIS — R42 Dizziness and giddiness: Secondary | ICD-10-CM | POA: Diagnosis not present

## 2016-03-01 DIAGNOSIS — N390 Urinary tract infection, site not specified: Secondary | ICD-10-CM | POA: Diagnosis not present

## 2016-03-15 DIAGNOSIS — R42 Dizziness and giddiness: Secondary | ICD-10-CM | POA: Diagnosis not present

## 2016-03-15 DIAGNOSIS — I4891 Unspecified atrial fibrillation: Secondary | ICD-10-CM | POA: Diagnosis not present

## 2016-03-28 ENCOUNTER — Encounter: Payer: Self-pay | Admitting: Podiatry

## 2016-03-28 ENCOUNTER — Ambulatory Visit (INDEPENDENT_AMBULATORY_CARE_PROVIDER_SITE_OTHER): Payer: Medicare Other | Admitting: Podiatry

## 2016-03-28 ENCOUNTER — Ambulatory Visit: Payer: Medicare Other | Admitting: Podiatry

## 2016-03-28 VITALS — BP 164/99 | HR 62 | Resp 14

## 2016-03-28 DIAGNOSIS — M79676 Pain in unspecified toe(s): Secondary | ICD-10-CM | POA: Diagnosis not present

## 2016-03-28 DIAGNOSIS — B351 Tinea unguium: Secondary | ICD-10-CM | POA: Diagnosis not present

## 2016-03-28 DIAGNOSIS — M6281 Muscle weakness (generalized): Secondary | ICD-10-CM | POA: Diagnosis not present

## 2016-03-28 DIAGNOSIS — R2689 Other abnormalities of gait and mobility: Secondary | ICD-10-CM | POA: Diagnosis not present

## 2016-03-28 DIAGNOSIS — R42 Dizziness and giddiness: Secondary | ICD-10-CM | POA: Diagnosis not present

## 2016-03-28 NOTE — Progress Notes (Signed)
Patient ID: Edgar ChurchBilly Dulac, male   DOB: 03/23/1930, 82 y.o.   MRN: [id number]    This patient presents for a scheduled visit complaining of elongated uncomfortable toenails when walking wearing shoes and is requesting nail debridement  Objective: Pleasant orientated 3 No open skin lesions bilaterally DP and PT pulses 2/4 bilaterally Capillary reflex delay bilaterally The toenails are incurvated, elongated, discolored, deformed and tender direct palpation 6-10  Assessment: Symptomatic onychomycoses 6-10   Plan: Debridement toenails 6-10 mechanically and electrically without any bleeding  Reappoint 3 months

## 2016-03-31 DIAGNOSIS — N390 Urinary tract infection, site not specified: Secondary | ICD-10-CM | POA: Diagnosis not present

## 2016-03-31 DIAGNOSIS — B079 Viral wart, unspecified: Secondary | ICD-10-CM | POA: Diagnosis not present

## 2016-03-31 DIAGNOSIS — R42 Dizziness and giddiness: Secondary | ICD-10-CM | POA: Diagnosis not present

## 2016-04-11 DIAGNOSIS — R42 Dizziness and giddiness: Secondary | ICD-10-CM | POA: Diagnosis not present

## 2016-04-11 DIAGNOSIS — M6281 Muscle weakness (generalized): Secondary | ICD-10-CM | POA: Diagnosis not present

## 2016-04-11 DIAGNOSIS — R2689 Other abnormalities of gait and mobility: Secondary | ICD-10-CM | POA: Diagnosis not present

## 2016-04-18 DIAGNOSIS — B079 Viral wart, unspecified: Secondary | ICD-10-CM | POA: Diagnosis not present

## 2016-05-04 DIAGNOSIS — I4891 Unspecified atrial fibrillation: Secondary | ICD-10-CM | POA: Diagnosis not present

## 2016-05-04 DIAGNOSIS — I2510 Atherosclerotic heart disease of native coronary artery without angina pectoris: Secondary | ICD-10-CM | POA: Diagnosis not present

## 2016-05-29 HISTORY — PX: CARDIOVERSION: SHX1299

## 2016-06-22 DIAGNOSIS — J029 Acute pharyngitis, unspecified: Secondary | ICD-10-CM | POA: Diagnosis not present

## 2016-06-22 DIAGNOSIS — R05 Cough: Secondary | ICD-10-CM | POA: Diagnosis not present

## 2016-06-22 DIAGNOSIS — J111 Influenza due to unidentified influenza virus with other respiratory manifestations: Secondary | ICD-10-CM | POA: Diagnosis not present

## 2016-06-27 ENCOUNTER — Encounter (INDEPENDENT_AMBULATORY_CARE_PROVIDER_SITE_OTHER): Payer: Medicare Other | Admitting: Podiatry

## 2016-06-27 NOTE — Progress Notes (Signed)
This encounter was created in error - please disregard.

## 2016-07-12 ENCOUNTER — Encounter: Payer: Self-pay | Admitting: Podiatry

## 2016-07-12 ENCOUNTER — Ambulatory Visit (INDEPENDENT_AMBULATORY_CARE_PROVIDER_SITE_OTHER): Payer: Medicare Other | Admitting: Podiatry

## 2016-07-12 VITALS — BP 154/91 | HR 68 | Resp 18

## 2016-07-12 DIAGNOSIS — B351 Tinea unguium: Secondary | ICD-10-CM

## 2016-07-12 DIAGNOSIS — M79676 Pain in unspecified toe(s): Secondary | ICD-10-CM | POA: Diagnosis not present

## 2016-07-13 DIAGNOSIS — J111 Influenza due to unidentified influenza virus with other respiratory manifestations: Secondary | ICD-10-CM | POA: Diagnosis not present

## 2016-07-13 NOTE — Progress Notes (Signed)
Patient ID: Edgar Thomas, male   DOB: 10/27/1929, 83 y.o.   MRN: [id number]    This patient presents for a scheduled visit complaining of elongated uncomfortable toenails when walking wearing shoes and is requesting nail debridement  Objective: Pleasant orientated 3 DP and PT pulses 2/4 bilaterally Capillary reflex delay bilaterally Sensation to 10 g monofilament wire intact 4/5 bilaterally Vibratory sensation nonreactive bilaterally Ankle reflexes reactive bilaterally No open skin lesions bilaterally Atrophic skin bilaterally The toenails are incurvated, elongated, discolored, deformed and tender direct palpation 6-10 Manual motor testing 5/5 bilaterally  Assessment: Symptomatic onychomycoses 6-10 Peripheral neuropathy  Plan: Debridement toenails 6-10 mechanically and electrically without any bleeding  Reappoint 3 months

## 2016-09-08 DIAGNOSIS — J22 Unspecified acute lower respiratory infection: Secondary | ICD-10-CM | POA: Diagnosis not present

## 2016-09-10 DIAGNOSIS — J209 Acute bronchitis, unspecified: Secondary | ICD-10-CM | POA: Diagnosis not present

## 2016-09-30 DIAGNOSIS — S51802A Unspecified open wound of left forearm, initial encounter: Secondary | ICD-10-CM | POA: Diagnosis not present

## 2016-10-03 DIAGNOSIS — J0100 Acute maxillary sinusitis, unspecified: Secondary | ICD-10-CM | POA: Diagnosis not present

## 2016-10-11 ENCOUNTER — Ambulatory Visit (INDEPENDENT_AMBULATORY_CARE_PROVIDER_SITE_OTHER): Payer: Medicare Other | Admitting: Podiatry

## 2016-10-11 DIAGNOSIS — B351 Tinea unguium: Secondary | ICD-10-CM | POA: Diagnosis not present

## 2016-10-11 DIAGNOSIS — M79676 Pain in unspecified toe(s): Secondary | ICD-10-CM | POA: Diagnosis not present

## 2016-10-12 NOTE — Progress Notes (Signed)
Patient ID: Edgar ChurchBilly Thomas, male   DOB: 03/07/1930, 83 y.o.   MRN: [id number]   Subjective: This patient presents for a scheduled visit complaining of elongated uncomfortable toenails when walking wearing shoes and is requesting nail debridement  Objective: Pleasant orientated 3 DP and PT pulses 2/4 bilaterally Capillary reflex delay bilaterally Sensation to 10 g monofilament wire intact 4/5 bilaterally Vibratory sensation nonreactive bilaterally Ankle reflexes reactive bilaterally No open skin lesions bilaterally Atrophic skin bilaterally Absent hair growth bilaterally The toenails are incurvated, elongated, discolored, deformed and tender direct palpation 6-10 Manual motor testing 5/5 bilaterally  Assessment: Symptomatic onychomycoses 6-10 Peripheral neuropathy  Plan: Debridement toenails 6-10 mechanically and electrically without any bleeding  Reappoint 3 months

## 2016-10-18 DIAGNOSIS — S92351A Displaced fracture of fifth metatarsal bone, right foot, initial encounter for closed fracture: Secondary | ICD-10-CM | POA: Diagnosis not present

## 2016-10-20 DIAGNOSIS — I4891 Unspecified atrial fibrillation: Secondary | ICD-10-CM | POA: Diagnosis not present

## 2016-10-20 DIAGNOSIS — I2510 Atherosclerotic heart disease of native coronary artery without angina pectoris: Secondary | ICD-10-CM | POA: Diagnosis not present

## 2016-10-25 DIAGNOSIS — Z0000 Encounter for general adult medical examination without abnormal findings: Secondary | ICD-10-CM | POA: Diagnosis not present

## 2016-10-25 DIAGNOSIS — E782 Mixed hyperlipidemia: Secondary | ICD-10-CM | POA: Diagnosis not present

## 2016-10-25 DIAGNOSIS — Z1389 Encounter for screening for other disorder: Secondary | ICD-10-CM | POA: Diagnosis not present

## 2016-10-25 DIAGNOSIS — Z79899 Other long term (current) drug therapy: Secondary | ICD-10-CM | POA: Diagnosis not present

## 2016-11-03 DIAGNOSIS — R509 Fever, unspecified: Secondary | ICD-10-CM | POA: Diagnosis not present

## 2016-11-03 DIAGNOSIS — R5381 Other malaise: Secondary | ICD-10-CM | POA: Diagnosis not present

## 2017-01-10 ENCOUNTER — Encounter: Payer: Self-pay | Admitting: Podiatry

## 2017-01-10 ENCOUNTER — Ambulatory Visit (INDEPENDENT_AMBULATORY_CARE_PROVIDER_SITE_OTHER): Payer: Medicare Other | Admitting: Podiatry

## 2017-01-10 DIAGNOSIS — B351 Tinea unguium: Secondary | ICD-10-CM

## 2017-01-10 DIAGNOSIS — M79676 Pain in unspecified toe(s): Secondary | ICD-10-CM | POA: Diagnosis not present

## 2017-01-10 NOTE — Progress Notes (Signed)
Patient ID: Edgar ChurchBilly Cliett, male   DOB: 10/12/1929, 83 y.o.   MRN: [id number]    Subjective: This patient presents for a scheduled visit complaining of elongated uncomfortable toenails when walking wearing shoes and is requesting nail debridement Patient has history of stubbing toes 1 month prior with improving symptoms in the toes 2-4 left  Objective: Pleasant orientated 3 DP and PT pulses 2/4 bilaterally Capillary reflex delay bilaterally Sensation to 10 g monofilament wire intact 4/5 bilaterally Vibratory sensation nonreactive bilaterally Ankle reflexes reactive bilaterally No open skin lesions bilaterally Atrophic skin bilaterally Absent hair growth bilaterally Ecchymosis dorsal second, third, fourth toes left foot Toes 2-4 rectus without any edema or tenderness to palpation The toenails are incurvated, elongated, discolored, deformed and tender direct palpation 6-10 Manual motor testing 5/5 bilaterally  Assessment: History of stubbing toes 2-4 left with resolving symptoms with satisfactory alignment of toes, without any treatment indicated Symptomatic onychomycoses 6-10 Peripheral neuropathy  Plan: Debridement toenails 6-10 mechanically and electrically without any bleeding  Reappoint 3 months

## 2017-03-13 IMAGING — CR DG CHEST 2V
2 series · 2 of 2 positions shown · non-contrast
Comparison: 08/28/2015

CLINICAL DATA: Chest pain, shortness of breath and near syncope
tonight.

EXAM:
CHEST  2 VIEW

[chest lat]
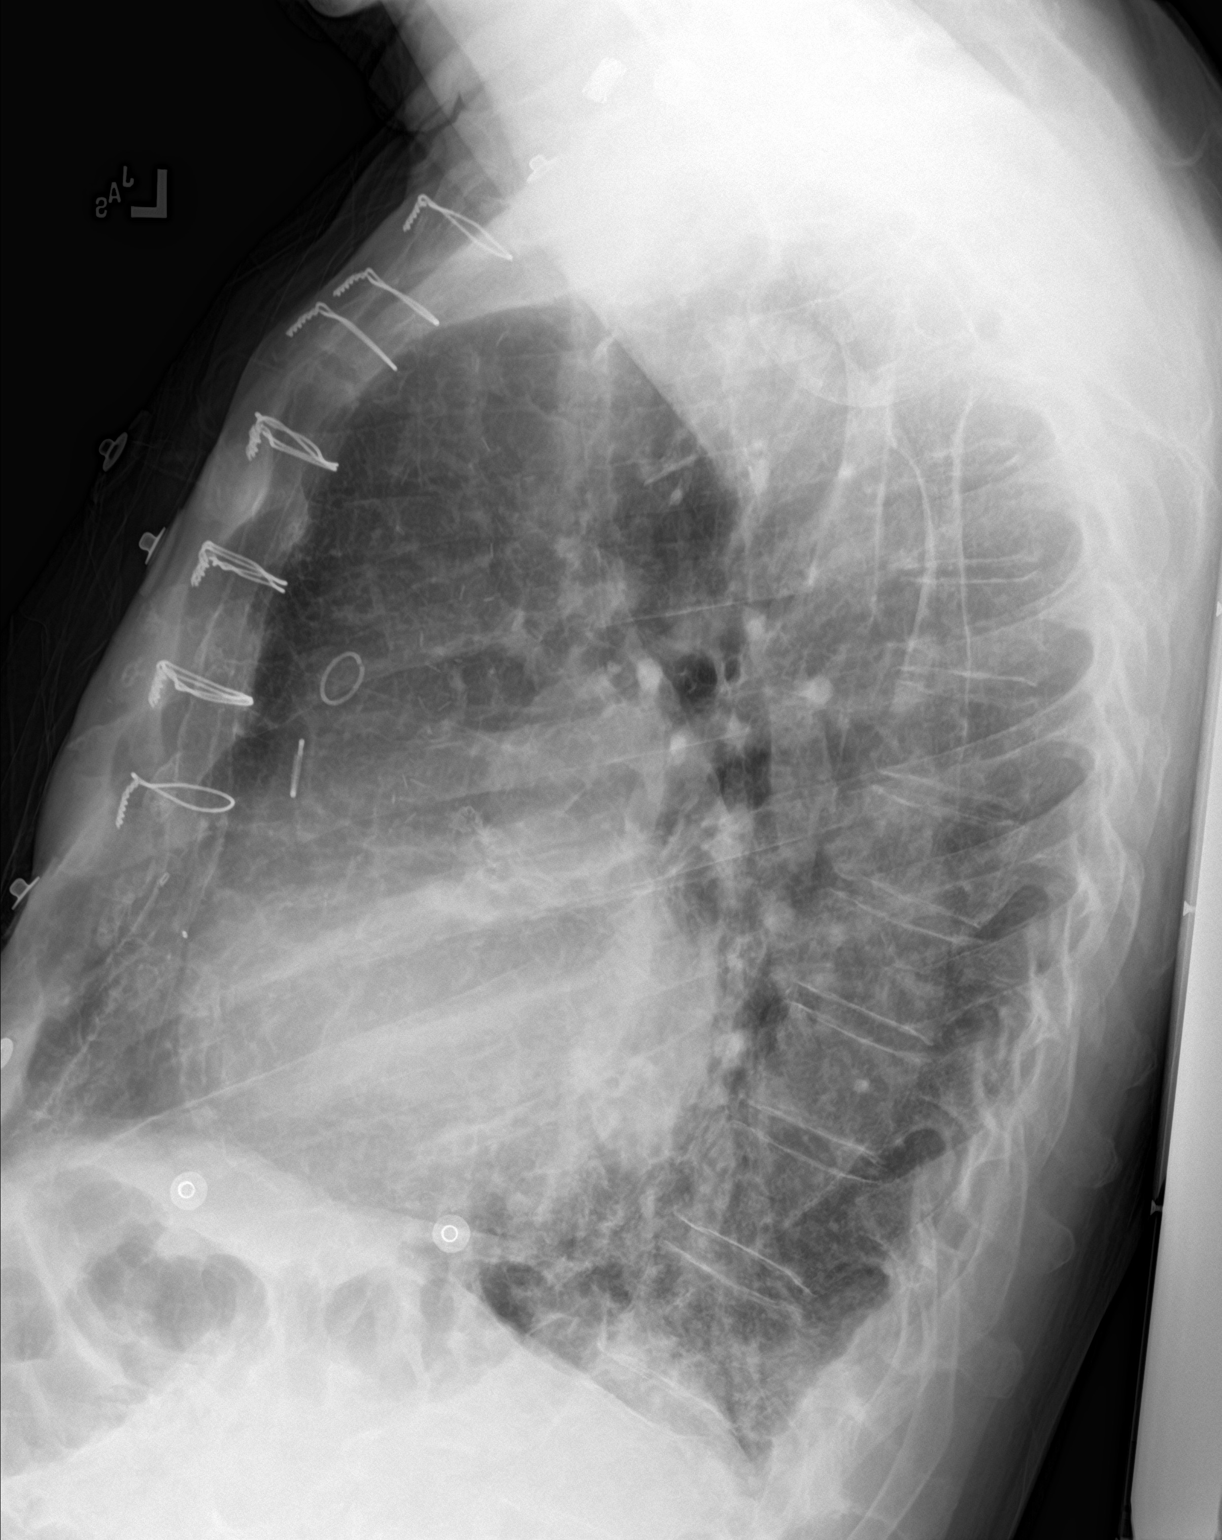

[chest ap]
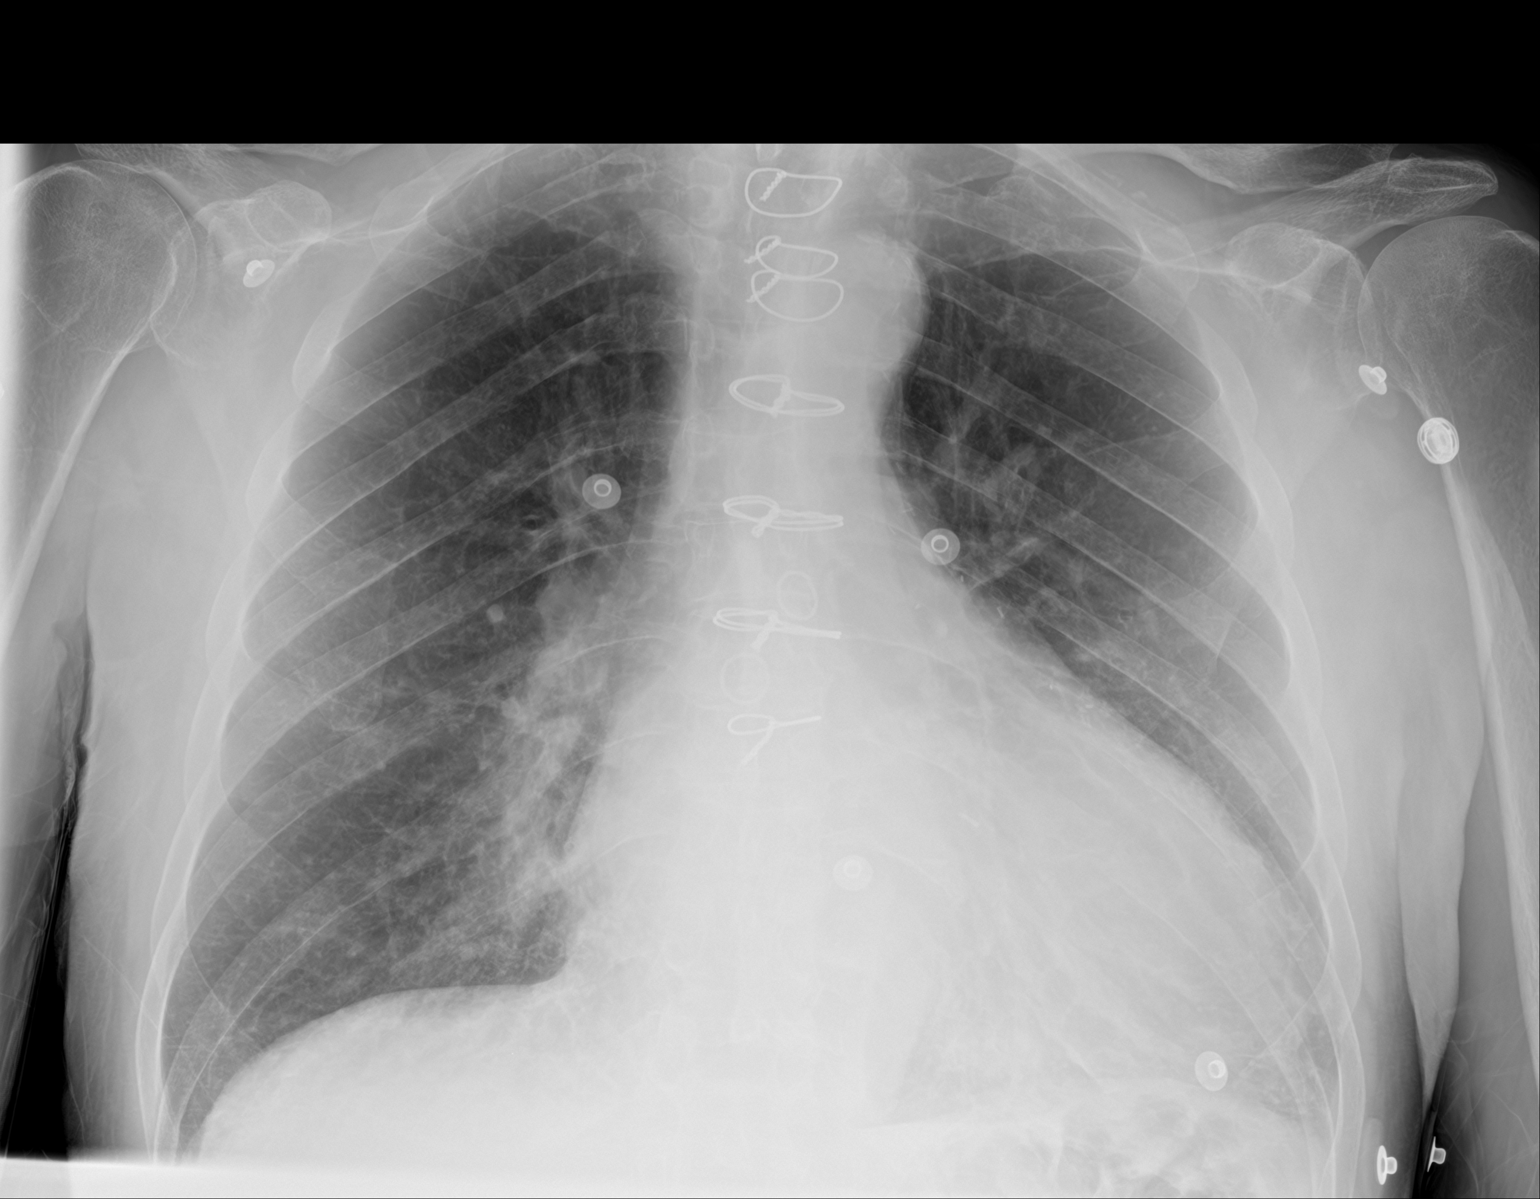

[2 of 2 positions shown; findings below may reference images not displayed]

FINDINGS: Post median sternotomy. Stable cardiomegaly. No pulmonary edema.
Lungs remain hyperinflated with probable emphysema. Chronic
bronchitic changes again seen. Mild bibasilar opacities, new,
favoring atelectasis. No large pleural effusion. No pneumothorax.
IMPRESSION: New bibasilar opacities favoring atelectasis. However given
distribution, aspiration could have a similar appearance.

## 2017-03-18 DIAGNOSIS — N3000 Acute cystitis without hematuria: Secondary | ICD-10-CM | POA: Diagnosis not present

## 2017-03-18 DIAGNOSIS — M549 Dorsalgia, unspecified: Secondary | ICD-10-CM | POA: Diagnosis not present

## 2017-03-18 DIAGNOSIS — N3090 Cystitis, unspecified without hematuria: Secondary | ICD-10-CM | POA: Diagnosis not present

## 2017-03-19 DIAGNOSIS — R05 Cough: Secondary | ICD-10-CM | POA: Diagnosis not present

## 2017-03-19 DIAGNOSIS — R0781 Pleurodynia: Secondary | ICD-10-CM | POA: Diagnosis not present

## 2017-03-19 DIAGNOSIS — R109 Unspecified abdominal pain: Secondary | ICD-10-CM | POA: Diagnosis not present

## 2017-03-19 DIAGNOSIS — J9811 Atelectasis: Secondary | ICD-10-CM | POA: Diagnosis not present

## 2017-03-20 DIAGNOSIS — I2510 Atherosclerotic heart disease of native coronary artery without angina pectoris: Secondary | ICD-10-CM | POA: Diagnosis not present

## 2017-03-20 DIAGNOSIS — I480 Paroxysmal atrial fibrillation: Secondary | ICD-10-CM | POA: Diagnosis not present

## 2017-03-21 DIAGNOSIS — B029 Zoster without complications: Secondary | ICD-10-CM | POA: Diagnosis not present

## 2017-03-21 DIAGNOSIS — Z79899 Other long term (current) drug therapy: Secondary | ICD-10-CM | POA: Diagnosis not present

## 2017-03-21 DIAGNOSIS — Z7901 Long term (current) use of anticoagulants: Secondary | ICD-10-CM | POA: Diagnosis not present

## 2017-03-21 DIAGNOSIS — I4891 Unspecified atrial fibrillation: Secondary | ICD-10-CM | POA: Diagnosis not present

## 2017-03-23 DIAGNOSIS — R109 Unspecified abdominal pain: Secondary | ICD-10-CM | POA: Diagnosis not present

## 2017-03-23 DIAGNOSIS — B029 Zoster without complications: Secondary | ICD-10-CM | POA: Diagnosis not present

## 2017-03-23 DIAGNOSIS — I4891 Unspecified atrial fibrillation: Secondary | ICD-10-CM | POA: Diagnosis not present

## 2017-03-27 DIAGNOSIS — N390 Urinary tract infection, site not specified: Secondary | ICD-10-CM | POA: Diagnosis not present

## 2017-03-31 DIAGNOSIS — J189 Pneumonia, unspecified organism: Secondary | ICD-10-CM | POA: Diagnosis not present

## 2017-04-01 DIAGNOSIS — R918 Other nonspecific abnormal finding of lung field: Secondary | ICD-10-CM | POA: Diagnosis not present

## 2017-04-01 DIAGNOSIS — E871 Hypo-osmolality and hyponatremia: Secondary | ICD-10-CM | POA: Diagnosis not present

## 2017-04-01 DIAGNOSIS — R110 Nausea: Secondary | ICD-10-CM | POA: Diagnosis not present

## 2017-04-01 DIAGNOSIS — R0602 Shortness of breath: Secondary | ICD-10-CM | POA: Diagnosis not present

## 2017-04-01 DIAGNOSIS — I340 Nonrheumatic mitral (valve) insufficiency: Secondary | ICD-10-CM | POA: Diagnosis not present

## 2017-04-01 DIAGNOSIS — I480 Paroxysmal atrial fibrillation: Secondary | ICD-10-CM | POA: Diagnosis not present

## 2017-04-01 DIAGNOSIS — J9601 Acute respiratory failure with hypoxia: Secondary | ICD-10-CM | POA: Diagnosis not present

## 2017-04-01 DIAGNOSIS — I517 Cardiomegaly: Secondary | ICD-10-CM | POA: Diagnosis not present

## 2017-04-01 DIAGNOSIS — I110 Hypertensive heart disease with heart failure: Secondary | ICD-10-CM | POA: Diagnosis not present

## 2017-04-01 DIAGNOSIS — B029 Zoster without complications: Secondary | ICD-10-CM | POA: Diagnosis not present

## 2017-04-01 DIAGNOSIS — I4891 Unspecified atrial fibrillation: Secondary | ICD-10-CM | POA: Diagnosis not present

## 2017-04-01 DIAGNOSIS — Z951 Presence of aortocoronary bypass graft: Secondary | ICD-10-CM | POA: Diagnosis not present

## 2017-04-01 DIAGNOSIS — R7989 Other specified abnormal findings of blood chemistry: Secondary | ICD-10-CM | POA: Diagnosis not present

## 2017-04-01 DIAGNOSIS — I5023 Acute on chronic systolic (congestive) heart failure: Secondary | ICD-10-CM | POA: Diagnosis not present

## 2017-04-01 DIAGNOSIS — I2510 Atherosclerotic heart disease of native coronary artery without angina pectoris: Secondary | ICD-10-CM | POA: Diagnosis not present

## 2017-04-01 DIAGNOSIS — J168 Pneumonia due to other specified infectious organisms: Secondary | ICD-10-CM | POA: Diagnosis not present

## 2017-04-01 DIAGNOSIS — R05 Cough: Secondary | ICD-10-CM | POA: Diagnosis not present

## 2017-04-02 DIAGNOSIS — E871 Hypo-osmolality and hyponatremia: Secondary | ICD-10-CM | POA: Diagnosis not present

## 2017-04-02 DIAGNOSIS — I4891 Unspecified atrial fibrillation: Secondary | ICD-10-CM | POA: Diagnosis not present

## 2017-04-02 DIAGNOSIS — R05 Cough: Secondary | ICD-10-CM | POA: Diagnosis not present

## 2017-04-02 DIAGNOSIS — I517 Cardiomegaly: Secondary | ICD-10-CM | POA: Diagnosis not present

## 2017-04-02 DIAGNOSIS — R918 Other nonspecific abnormal finding of lung field: Secondary | ICD-10-CM | POA: Diagnosis not present

## 2017-04-02 DIAGNOSIS — I351 Nonrheumatic aortic (valve) insufficiency: Secondary | ICD-10-CM | POA: Diagnosis not present

## 2017-04-02 DIAGNOSIS — I340 Nonrheumatic mitral (valve) insufficiency: Secondary | ICD-10-CM | POA: Diagnosis not present

## 2017-04-02 DIAGNOSIS — I071 Rheumatic tricuspid insufficiency: Secondary | ICD-10-CM | POA: Diagnosis not present

## 2017-04-02 DIAGNOSIS — R7989 Other specified abnormal findings of blood chemistry: Secondary | ICD-10-CM | POA: Diagnosis not present

## 2017-04-03 DIAGNOSIS — I4891 Unspecified atrial fibrillation: Secondary | ICD-10-CM | POA: Diagnosis not present

## 2017-04-03 DIAGNOSIS — B029 Zoster without complications: Secondary | ICD-10-CM | POA: Diagnosis not present

## 2017-04-03 DIAGNOSIS — Z951 Presence of aortocoronary bypass graft: Secondary | ICD-10-CM | POA: Diagnosis not present

## 2017-04-03 DIAGNOSIS — I2510 Atherosclerotic heart disease of native coronary artery without angina pectoris: Secondary | ICD-10-CM | POA: Diagnosis not present

## 2017-04-03 DIAGNOSIS — Z7901 Long term (current) use of anticoagulants: Secondary | ICD-10-CM | POA: Diagnosis not present

## 2017-04-03 DIAGNOSIS — I429 Cardiomyopathy, unspecified: Secondary | ICD-10-CM | POA: Diagnosis not present

## 2017-04-03 DIAGNOSIS — I340 Nonrheumatic mitral (valve) insufficiency: Secondary | ICD-10-CM | POA: Diagnosis not present

## 2017-04-03 DIAGNOSIS — I5023 Acute on chronic systolic (congestive) heart failure: Secondary | ICD-10-CM | POA: Diagnosis not present

## 2017-04-03 DIAGNOSIS — I5020 Unspecified systolic (congestive) heart failure: Secondary | ICD-10-CM | POA: Diagnosis not present

## 2017-04-03 DIAGNOSIS — Z7982 Long term (current) use of aspirin: Secondary | ICD-10-CM | POA: Diagnosis not present

## 2017-04-03 DIAGNOSIS — R0602 Shortness of breath: Secondary | ICD-10-CM | POA: Diagnosis not present

## 2017-04-05 DIAGNOSIS — I2510 Atherosclerotic heart disease of native coronary artery without angina pectoris: Secondary | ICD-10-CM | POA: Diagnosis not present

## 2017-04-05 DIAGNOSIS — I480 Paroxysmal atrial fibrillation: Secondary | ICD-10-CM | POA: Diagnosis not present

## 2017-04-05 DIAGNOSIS — I509 Heart failure, unspecified: Secondary | ICD-10-CM | POA: Diagnosis not present

## 2017-04-09 ENCOUNTER — Ambulatory Visit: Payer: Medicare Other | Admitting: Podiatry

## 2017-04-09 DIAGNOSIS — I5043 Acute on chronic combined systolic (congestive) and diastolic (congestive) heart failure: Secondary | ICD-10-CM | POA: Diagnosis not present

## 2017-04-09 DIAGNOSIS — Z951 Presence of aortocoronary bypass graft: Secondary | ICD-10-CM | POA: Diagnosis not present

## 2017-04-09 DIAGNOSIS — I4891 Unspecified atrial fibrillation: Secondary | ICD-10-CM | POA: Diagnosis not present

## 2017-04-09 DIAGNOSIS — I2510 Atherosclerotic heart disease of native coronary artery without angina pectoris: Secondary | ICD-10-CM | POA: Diagnosis not present

## 2017-04-09 DIAGNOSIS — I700 Atherosclerosis of aorta: Secondary | ICD-10-CM | POA: Diagnosis not present

## 2017-04-09 DIAGNOSIS — I110 Hypertensive heart disease with heart failure: Secondary | ICD-10-CM | POA: Diagnosis not present

## 2017-04-09 DIAGNOSIS — I491 Atrial premature depolarization: Secondary | ICD-10-CM | POA: Diagnosis not present

## 2017-04-09 DIAGNOSIS — I517 Cardiomegaly: Secondary | ICD-10-CM | POA: Diagnosis not present

## 2017-04-09 DIAGNOSIS — R9431 Abnormal electrocardiogram [ECG] [EKG]: Secondary | ICD-10-CM | POA: Diagnosis not present

## 2017-04-09 DIAGNOSIS — B028 Zoster with other complications: Secondary | ICD-10-CM | POA: Diagnosis not present

## 2017-04-09 DIAGNOSIS — I509 Heart failure, unspecified: Secondary | ICD-10-CM | POA: Diagnosis not present

## 2017-04-09 DIAGNOSIS — I499 Cardiac arrhythmia, unspecified: Secondary | ICD-10-CM | POA: Diagnosis not present

## 2017-04-09 DIAGNOSIS — I2129 ST elevation (STEMI) myocardial infarction involving other sites: Secondary | ICD-10-CM | POA: Diagnosis not present

## 2017-04-09 DIAGNOSIS — R001 Bradycardia, unspecified: Secondary | ICD-10-CM | POA: Diagnosis not present

## 2017-04-09 DIAGNOSIS — Z7982 Long term (current) use of aspirin: Secondary | ICD-10-CM | POA: Diagnosis not present

## 2017-04-09 DIAGNOSIS — J9811 Atelectasis: Secondary | ICD-10-CM | POA: Diagnosis not present

## 2017-04-09 DIAGNOSIS — I480 Paroxysmal atrial fibrillation: Secondary | ICD-10-CM | POA: Diagnosis not present

## 2017-04-09 DIAGNOSIS — R05 Cough: Secondary | ICD-10-CM | POA: Diagnosis not present

## 2017-04-09 DIAGNOSIS — E785 Hyperlipidemia, unspecified: Secondary | ICD-10-CM | POA: Diagnosis not present

## 2017-04-09 DIAGNOSIS — I444 Left anterior fascicular block: Secondary | ICD-10-CM | POA: Diagnosis not present

## 2017-04-09 DIAGNOSIS — J90 Pleural effusion, not elsewhere classified: Secondary | ICD-10-CM | POA: Diagnosis not present

## 2017-04-10 DIAGNOSIS — R001 Bradycardia, unspecified: Secondary | ICD-10-CM | POA: Diagnosis not present

## 2017-04-10 DIAGNOSIS — I499 Cardiac arrhythmia, unspecified: Secondary | ICD-10-CM | POA: Diagnosis not present

## 2017-04-10 DIAGNOSIS — I2510 Atherosclerotic heart disease of native coronary artery without angina pectoris: Secondary | ICD-10-CM | POA: Diagnosis not present

## 2017-04-10 DIAGNOSIS — I4891 Unspecified atrial fibrillation: Secondary | ICD-10-CM | POA: Diagnosis not present

## 2017-04-10 DIAGNOSIS — I5043 Acute on chronic combined systolic (congestive) and diastolic (congestive) heart failure: Secondary | ICD-10-CM | POA: Diagnosis not present

## 2017-04-10 DIAGNOSIS — I444 Left anterior fascicular block: Secondary | ICD-10-CM | POA: Diagnosis not present

## 2017-04-10 DIAGNOSIS — I517 Cardiomegaly: Secondary | ICD-10-CM | POA: Diagnosis not present

## 2017-04-10 DIAGNOSIS — I480 Paroxysmal atrial fibrillation: Secondary | ICD-10-CM | POA: Diagnosis not present

## 2017-04-10 DIAGNOSIS — R9431 Abnormal electrocardiogram [ECG] [EKG]: Secondary | ICD-10-CM | POA: Diagnosis not present

## 2017-04-11 DIAGNOSIS — I499 Cardiac arrhythmia, unspecified: Secondary | ICD-10-CM | POA: Diagnosis not present

## 2017-04-11 DIAGNOSIS — I444 Left anterior fascicular block: Secondary | ICD-10-CM | POA: Diagnosis not present

## 2017-04-11 DIAGNOSIS — I491 Atrial premature depolarization: Secondary | ICD-10-CM | POA: Diagnosis not present

## 2017-04-11 DIAGNOSIS — I5043 Acute on chronic combined systolic (congestive) and diastolic (congestive) heart failure: Secondary | ICD-10-CM | POA: Diagnosis not present

## 2017-04-11 DIAGNOSIS — I4891 Unspecified atrial fibrillation: Secondary | ICD-10-CM | POA: Diagnosis not present

## 2017-04-11 DIAGNOSIS — I2510 Atherosclerotic heart disease of native coronary artery without angina pectoris: Secondary | ICD-10-CM | POA: Diagnosis not present

## 2017-04-11 DIAGNOSIS — I517 Cardiomegaly: Secondary | ICD-10-CM | POA: Diagnosis not present

## 2017-04-11 DIAGNOSIS — I480 Paroxysmal atrial fibrillation: Secondary | ICD-10-CM | POA: Diagnosis not present

## 2017-04-11 DIAGNOSIS — R001 Bradycardia, unspecified: Secondary | ICD-10-CM | POA: Diagnosis not present

## 2017-04-11 DIAGNOSIS — R9431 Abnormal electrocardiogram [ECG] [EKG]: Secondary | ICD-10-CM | POA: Diagnosis not present

## 2017-04-11 DIAGNOSIS — I2129 ST elevation (STEMI) myocardial infarction involving other sites: Secondary | ICD-10-CM | POA: Diagnosis not present

## 2017-04-12 DIAGNOSIS — I480 Paroxysmal atrial fibrillation: Secondary | ICD-10-CM | POA: Diagnosis not present

## 2017-04-12 DIAGNOSIS — I5043 Acute on chronic combined systolic (congestive) and diastolic (congestive) heart failure: Secondary | ICD-10-CM | POA: Diagnosis not present

## 2017-04-12 DIAGNOSIS — R001 Bradycardia, unspecified: Secondary | ICD-10-CM | POA: Diagnosis not present

## 2017-04-12 DIAGNOSIS — I2510 Atherosclerotic heart disease of native coronary artery without angina pectoris: Secondary | ICD-10-CM | POA: Diagnosis not present

## 2017-04-12 DIAGNOSIS — I499 Cardiac arrhythmia, unspecified: Secondary | ICD-10-CM | POA: Diagnosis not present

## 2017-04-12 DIAGNOSIS — B028 Zoster with other complications: Secondary | ICD-10-CM | POA: Diagnosis not present

## 2017-04-12 DIAGNOSIS — R9431 Abnormal electrocardiogram [ECG] [EKG]: Secondary | ICD-10-CM | POA: Diagnosis not present

## 2017-04-12 DIAGNOSIS — I517 Cardiomegaly: Secondary | ICD-10-CM | POA: Diagnosis not present

## 2017-04-12 DIAGNOSIS — I4891 Unspecified atrial fibrillation: Secondary | ICD-10-CM | POA: Diagnosis not present

## 2017-04-12 DIAGNOSIS — I444 Left anterior fascicular block: Secondary | ICD-10-CM | POA: Diagnosis not present

## 2017-04-13 DIAGNOSIS — R9431 Abnormal electrocardiogram [ECG] [EKG]: Secondary | ICD-10-CM | POA: Diagnosis not present

## 2017-04-13 DIAGNOSIS — I480 Paroxysmal atrial fibrillation: Secondary | ICD-10-CM | POA: Diagnosis not present

## 2017-04-13 DIAGNOSIS — I4891 Unspecified atrial fibrillation: Secondary | ICD-10-CM | POA: Diagnosis not present

## 2017-04-13 DIAGNOSIS — I444 Left anterior fascicular block: Secondary | ICD-10-CM | POA: Diagnosis not present

## 2017-04-13 DIAGNOSIS — I499 Cardiac arrhythmia, unspecified: Secondary | ICD-10-CM | POA: Diagnosis not present

## 2017-04-13 DIAGNOSIS — I5043 Acute on chronic combined systolic (congestive) and diastolic (congestive) heart failure: Secondary | ICD-10-CM | POA: Diagnosis not present

## 2017-04-13 DIAGNOSIS — I2510 Atherosclerotic heart disease of native coronary artery without angina pectoris: Secondary | ICD-10-CM | POA: Diagnosis not present

## 2017-04-13 DIAGNOSIS — I517 Cardiomegaly: Secondary | ICD-10-CM | POA: Diagnosis not present

## 2017-04-17 DIAGNOSIS — I2510 Atherosclerotic heart disease of native coronary artery without angina pectoris: Secondary | ICD-10-CM | POA: Diagnosis not present

## 2017-04-17 DIAGNOSIS — I480 Paroxysmal atrial fibrillation: Secondary | ICD-10-CM | POA: Diagnosis not present

## 2017-04-17 DIAGNOSIS — I4891 Unspecified atrial fibrillation: Secondary | ICD-10-CM | POA: Diagnosis not present

## 2017-04-27 DIAGNOSIS — M25561 Pain in right knee: Secondary | ICD-10-CM | POA: Diagnosis not present

## 2017-05-31 DIAGNOSIS — I10 Essential (primary) hypertension: Secondary | ICD-10-CM | POA: Diagnosis not present

## 2017-05-31 DIAGNOSIS — I4891 Unspecified atrial fibrillation: Secondary | ICD-10-CM | POA: Diagnosis not present

## 2017-06-06 DIAGNOSIS — E559 Vitamin D deficiency, unspecified: Secondary | ICD-10-CM | POA: Diagnosis not present

## 2017-06-06 DIAGNOSIS — I119 Hypertensive heart disease without heart failure: Secondary | ICD-10-CM | POA: Diagnosis not present

## 2017-06-06 DIAGNOSIS — E782 Mixed hyperlipidemia: Secondary | ICD-10-CM | POA: Diagnosis not present

## 2017-06-06 DIAGNOSIS — I4891 Unspecified atrial fibrillation: Secondary | ICD-10-CM | POA: Diagnosis not present

## 2017-06-11 ENCOUNTER — Ambulatory Visit (INDEPENDENT_AMBULATORY_CARE_PROVIDER_SITE_OTHER): Payer: Medicare Other | Admitting: Podiatry

## 2017-06-11 ENCOUNTER — Encounter: Payer: Self-pay | Admitting: Podiatry

## 2017-06-11 DIAGNOSIS — M79674 Pain in right toe(s): Secondary | ICD-10-CM | POA: Diagnosis not present

## 2017-06-11 DIAGNOSIS — M79675 Pain in left toe(s): Secondary | ICD-10-CM | POA: Diagnosis not present

## 2017-06-11 DIAGNOSIS — B351 Tinea unguium: Secondary | ICD-10-CM | POA: Diagnosis not present

## 2017-08-15 DIAGNOSIS — I4891 Unspecified atrial fibrillation: Secondary | ICD-10-CM | POA: Diagnosis not present

## 2017-08-15 DIAGNOSIS — I509 Heart failure, unspecified: Secondary | ICD-10-CM | POA: Diagnosis not present

## 2017-08-15 DIAGNOSIS — I2510 Atherosclerotic heart disease of native coronary artery without angina pectoris: Secondary | ICD-10-CM | POA: Diagnosis not present

## 2017-08-22 DIAGNOSIS — B029 Zoster without complications: Secondary | ICD-10-CM | POA: Diagnosis not present

## 2017-09-01 DIAGNOSIS — B029 Zoster without complications: Secondary | ICD-10-CM | POA: Diagnosis not present

## 2017-09-10 DIAGNOSIS — R609 Edema, unspecified: Secondary | ICD-10-CM | POA: Diagnosis not present

## 2017-09-12 ENCOUNTER — Ambulatory Visit (INDEPENDENT_AMBULATORY_CARE_PROVIDER_SITE_OTHER): Payer: Medicare Other | Admitting: Podiatry

## 2017-09-12 ENCOUNTER — Encounter: Payer: Self-pay | Admitting: Podiatry

## 2017-09-12 DIAGNOSIS — B351 Tinea unguium: Secondary | ICD-10-CM

## 2017-09-12 DIAGNOSIS — M79674 Pain in right toe(s): Secondary | ICD-10-CM | POA: Diagnosis not present

## 2017-09-12 DIAGNOSIS — M79675 Pain in left toe(s): Secondary | ICD-10-CM

## 2017-09-16 NOTE — Progress Notes (Signed)
Subjective:   Patient ID: Edgar ChurchBilly Thomas, male   DOB: 84 y.o.   MRN: [id number]   HPI Patient presents with elongated thick nailbeds 1-5 both feet that are irritated and painful   ROS      Objective:  Physical Exam  Neurovascular status intact with yellow brittle nailbeds 1-5 both feet     Assessment:  Mycotic nail infection with pain 1-5 both feet     Plan:  Debride painful nailbeds 1-5 both feet with no iatrogenic bleeding noted

## 2017-11-13 DIAGNOSIS — B0229 Other postherpetic nervous system involvement: Secondary | ICD-10-CM | POA: Diagnosis not present

## 2017-11-13 DIAGNOSIS — M109 Gout, unspecified: Secondary | ICD-10-CM | POA: Diagnosis not present

## 2017-11-22 DIAGNOSIS — I2510 Atherosclerotic heart disease of native coronary artery without angina pectoris: Secondary | ICD-10-CM | POA: Diagnosis not present

## 2017-11-22 DIAGNOSIS — I10 Essential (primary) hypertension: Secondary | ICD-10-CM | POA: Diagnosis not present

## 2017-11-22 DIAGNOSIS — E782 Mixed hyperlipidemia: Secondary | ICD-10-CM | POA: Diagnosis not present

## 2017-11-22 DIAGNOSIS — R5382 Chronic fatigue, unspecified: Secondary | ICD-10-CM | POA: Diagnosis not present

## 2017-11-22 DIAGNOSIS — E559 Vitamin D deficiency, unspecified: Secondary | ICD-10-CM | POA: Diagnosis not present

## 2017-12-13 DIAGNOSIS — E782 Mixed hyperlipidemia: Secondary | ICD-10-CM | POA: Diagnosis not present

## 2017-12-13 DIAGNOSIS — I2510 Atherosclerotic heart disease of native coronary artery without angina pectoris: Secondary | ICD-10-CM | POA: Diagnosis not present

## 2017-12-13 DIAGNOSIS — R600 Localized edema: Secondary | ICD-10-CM | POA: Diagnosis not present

## 2017-12-13 DIAGNOSIS — E559 Vitamin D deficiency, unspecified: Secondary | ICD-10-CM | POA: Diagnosis not present

## 2017-12-14 ENCOUNTER — Ambulatory Visit (INDEPENDENT_AMBULATORY_CARE_PROVIDER_SITE_OTHER): Payer: Medicare Other | Admitting: Podiatry

## 2017-12-14 DIAGNOSIS — B351 Tinea unguium: Secondary | ICD-10-CM | POA: Diagnosis not present

## 2017-12-14 DIAGNOSIS — M79675 Pain in left toe(s): Secondary | ICD-10-CM

## 2017-12-14 DIAGNOSIS — M79676 Pain in unspecified toe(s): Secondary | ICD-10-CM

## 2017-12-14 DIAGNOSIS — M79674 Pain in right toe(s): Secondary | ICD-10-CM

## 2017-12-17 DIAGNOSIS — I2510 Atherosclerotic heart disease of native coronary artery without angina pectoris: Secondary | ICD-10-CM | POA: Diagnosis not present

## 2017-12-17 DIAGNOSIS — I480 Paroxysmal atrial fibrillation: Secondary | ICD-10-CM | POA: Diagnosis not present

## 2017-12-17 NOTE — Progress Notes (Signed)
Subjective:   Patient ID: Edgar Thomas, male   DOB: 84 y.o.   MRN: [id number]   HPI Patient presents with painful nailbeds 1-5 both feet that are thick yellow and the patient cannot cut   ROS      Objective:  Physical Exam  Neurovascular status intact with thick yellow brittle nailbeds 1-5 both feet that are painful and she cannot cut     Assessment:  Mycotic nail infection with pain 1-5 both feet     Plan:  Debride painful nailbeds 1-5 both feet with no iatrogenic bleeding noted

## 2018-01-21 DIAGNOSIS — H25812 Combined forms of age-related cataract, left eye: Secondary | ICD-10-CM | POA: Diagnosis not present

## 2018-01-21 DIAGNOSIS — H25811 Combined forms of age-related cataract, right eye: Secondary | ICD-10-CM | POA: Diagnosis not present

## 2018-02-04 NOTE — Progress Notes (Signed)
Patient ID: Edgar Thomas, male   DOB: 02-20-1930, 83 y.o.   MRN: [id number]    Subjective: This patient presents for a scheduled visit complaining of elongated uncomfortable toenails when walking wearing shoes and is requesting nail debridement Patient has history of stubbing toes 1 month prior with improving symptoms in the toes 2-4 left  Objective: Pleasant orientated 3 DP and PT pulses 2/4 bilaterally Capillary reflex delay bilaterally Sensation to 10 g monofilament wire intact 4/5 bilaterally Vibratory sensation nonreactive bilaterally Ankle reflexes reactive bilaterally No open skin lesions bilaterally Atrophic skin bilaterally Absent hair growth bilaterally Ecchymosis dorsal second, third, fourth toes left foot Toes 2-4 rectus without any edema or tenderness to palpation The toenails are incurvated, elongated, discolored, deformed and tender direct palpation 6-10 Manual motor testing 5/5 bilaterally  Assessment: History of stubbing toes 2-4 left with resolving symptoms with satisfactory alignment of toes, without any treatment indicated Symptomatic onychomycoses 6-10 Peripheral neuropathy  Plan: Debridement toenails 6-10 mechanically and electrically without any bleeding  Reappoint 3 months

## 2018-02-17 DIAGNOSIS — H25819 Combined forms of age-related cataract, unspecified eye: Secondary | ICD-10-CM | POA: Insufficient documentation

## 2018-02-18 DIAGNOSIS — H25813 Combined forms of age-related cataract, bilateral: Secondary | ICD-10-CM | POA: Diagnosis not present

## 2018-02-18 DIAGNOSIS — H353231 Exudative age-related macular degeneration, bilateral, with active choroidal neovascularization: Secondary | ICD-10-CM | POA: Diagnosis not present

## 2018-02-22 DIAGNOSIS — H3554 Dystrophies primarily involving the retinal pigment epithelium: Secondary | ICD-10-CM | POA: Diagnosis not present

## 2018-02-22 DIAGNOSIS — H25813 Combined forms of age-related cataract, bilateral: Secondary | ICD-10-CM | POA: Diagnosis not present

## 2018-02-22 DIAGNOSIS — H353132 Nonexudative age-related macular degeneration, bilateral, intermediate dry stage: Secondary | ICD-10-CM | POA: Diagnosis not present

## 2018-02-26 DIAGNOSIS — H3530 Unspecified macular degeneration: Secondary | ICD-10-CM | POA: Insufficient documentation

## 2018-02-27 DIAGNOSIS — H25813 Combined forms of age-related cataract, bilateral: Secondary | ICD-10-CM | POA: Diagnosis not present

## 2018-02-27 DIAGNOSIS — H3530 Unspecified macular degeneration: Secondary | ICD-10-CM | POA: Diagnosis not present

## 2018-03-05 DIAGNOSIS — H3530 Unspecified macular degeneration: Secondary | ICD-10-CM | POA: Diagnosis not present

## 2018-03-05 DIAGNOSIS — H25812 Combined forms of age-related cataract, left eye: Secondary | ICD-10-CM | POA: Diagnosis not present

## 2018-03-05 DIAGNOSIS — H52201 Unspecified astigmatism, right eye: Secondary | ICD-10-CM | POA: Diagnosis not present

## 2018-03-05 DIAGNOSIS — I4891 Unspecified atrial fibrillation: Secondary | ICD-10-CM | POA: Diagnosis not present

## 2018-03-05 DIAGNOSIS — H25811 Combined forms of age-related cataract, right eye: Secondary | ICD-10-CM | POA: Diagnosis not present

## 2018-03-06 DIAGNOSIS — Z9849 Cataract extraction status, unspecified eye: Secondary | ICD-10-CM | POA: Insufficient documentation

## 2018-03-06 DIAGNOSIS — Z961 Presence of intraocular lens: Secondary | ICD-10-CM | POA: Insufficient documentation

## 2018-03-13 DIAGNOSIS — I2510 Atherosclerotic heart disease of native coronary artery without angina pectoris: Secondary | ICD-10-CM | POA: Diagnosis not present

## 2018-03-13 DIAGNOSIS — I4891 Unspecified atrial fibrillation: Secondary | ICD-10-CM | POA: Diagnosis not present

## 2018-03-13 DIAGNOSIS — B029 Zoster without complications: Secondary | ICD-10-CM | POA: Diagnosis not present

## 2018-03-13 DIAGNOSIS — R600 Localized edema: Secondary | ICD-10-CM | POA: Diagnosis not present

## 2018-03-13 DIAGNOSIS — I10 Essential (primary) hypertension: Secondary | ICD-10-CM | POA: Diagnosis not present

## 2018-03-13 DIAGNOSIS — E782 Mixed hyperlipidemia: Secondary | ICD-10-CM | POA: Diagnosis not present

## 2018-03-18 ENCOUNTER — Ambulatory Visit (INDEPENDENT_AMBULATORY_CARE_PROVIDER_SITE_OTHER): Payer: Medicare Other | Admitting: Podiatry

## 2018-03-18 ENCOUNTER — Encounter: Payer: Self-pay | Admitting: Podiatry

## 2018-03-18 DIAGNOSIS — M79675 Pain in left toe(s): Secondary | ICD-10-CM

## 2018-03-18 DIAGNOSIS — B351 Tinea unguium: Secondary | ICD-10-CM | POA: Diagnosis not present

## 2018-03-18 DIAGNOSIS — M79674 Pain in right toe(s): Secondary | ICD-10-CM

## 2018-03-19 DIAGNOSIS — H25812 Combined forms of age-related cataract, left eye: Secondary | ICD-10-CM | POA: Diagnosis not present

## 2018-03-19 DIAGNOSIS — I509 Heart failure, unspecified: Secondary | ICD-10-CM | POA: Diagnosis not present

## 2018-03-19 DIAGNOSIS — I480 Paroxysmal atrial fibrillation: Secondary | ICD-10-CM | POA: Diagnosis not present

## 2018-03-19 DIAGNOSIS — I2510 Atherosclerotic heart disease of native coronary artery without angina pectoris: Secondary | ICD-10-CM | POA: Diagnosis not present

## 2018-03-19 DIAGNOSIS — K219 Gastro-esophageal reflux disease without esophagitis: Secondary | ICD-10-CM | POA: Diagnosis not present

## 2018-03-21 NOTE — Progress Notes (Signed)
Subjective:   Patient ID: Edgar Thomas, male   DOB: 84 y.o.   MRN: [id number]   HPI Patient presents with elongated nailbeds 1-5 both feet that are thick yellow brittle and moderately painful when palpated   ROS      Objective:  Physical Exam  Mycotic nail infection with pain 1-5 both feet     Assessment:  Thick yellow brittle nailbeds 1-5 both feet that are painful when pressed     Plan:  Debridement of nailbeds 1-5 both feet with no iatrogenic bleeding noted

## 2018-04-19 DIAGNOSIS — I2510 Atherosclerotic heart disease of native coronary artery without angina pectoris: Secondary | ICD-10-CM | POA: Diagnosis not present

## 2018-04-19 DIAGNOSIS — I480 Paroxysmal atrial fibrillation: Secondary | ICD-10-CM | POA: Diagnosis not present

## 2018-05-08 DIAGNOSIS — J0100 Acute maxillary sinusitis, unspecified: Secondary | ICD-10-CM | POA: Diagnosis not present

## 2018-05-08 DIAGNOSIS — R600 Localized edema: Secondary | ICD-10-CM | POA: Diagnosis not present

## 2018-05-08 DIAGNOSIS — I2510 Atherosclerotic heart disease of native coronary artery without angina pectoris: Secondary | ICD-10-CM | POA: Diagnosis not present

## 2018-05-08 DIAGNOSIS — I4891 Unspecified atrial fibrillation: Secondary | ICD-10-CM | POA: Diagnosis not present

## 2018-06-14 DIAGNOSIS — I2510 Atherosclerotic heart disease of native coronary artery without angina pectoris: Secondary | ICD-10-CM | POA: Diagnosis not present

## 2018-06-14 DIAGNOSIS — R600 Localized edema: Secondary | ICD-10-CM | POA: Diagnosis not present

## 2018-06-14 DIAGNOSIS — I4891 Unspecified atrial fibrillation: Secondary | ICD-10-CM | POA: Diagnosis not present

## 2018-06-14 DIAGNOSIS — E559 Vitamin D deficiency, unspecified: Secondary | ICD-10-CM | POA: Diagnosis not present

## 2018-06-14 DIAGNOSIS — E782 Mixed hyperlipidemia: Secondary | ICD-10-CM | POA: Diagnosis not present

## 2018-06-14 DIAGNOSIS — I10 Essential (primary) hypertension: Secondary | ICD-10-CM | POA: Diagnosis not present

## 2018-06-18 ENCOUNTER — Encounter: Payer: Self-pay | Admitting: Podiatry

## 2018-06-18 ENCOUNTER — Ambulatory Visit: Payer: Medicare Other | Admitting: Podiatry

## 2018-06-18 DIAGNOSIS — M79674 Pain in right toe(s): Secondary | ICD-10-CM | POA: Diagnosis not present

## 2018-06-18 DIAGNOSIS — M79675 Pain in left toe(s): Secondary | ICD-10-CM | POA: Diagnosis not present

## 2018-06-18 DIAGNOSIS — B351 Tinea unguium: Secondary | ICD-10-CM | POA: Diagnosis not present

## 2018-06-18 NOTE — Patient Instructions (Signed)

## 2018-07-01 NOTE — Progress Notes (Signed)
Subjective: Edgar Thomas presents today with painful, thick toenails 1-5 b/l that he cannot cut and which interfere with daily activities.  Pain is aggravated when wearing enclosed shoe gear.  Patient is on gabapentin.  He is also on Eliquis.    Current Outpatient Medications:  .  amiodarone (PACERONE) 200 MG tablet, Take 459m (2 tablets) twice a day for 1 week, then continue 2088m(1 tab) twice a day thereafter, Disp: 90 tablet, Rfl: 3 .  amoxicillin-clavulanate (AUGMENTIN) 875-125 MG tablet, Take 1 tablet by mouth 2 (two) times daily., Disp: 14 tablet, Rfl: 0 .  benzonatate (TESSALON) 100 MG capsule, Take 1 capsule (100 mg total) by mouth 3 (three) times daily as needed for cough., Disp: 45 capsule, Rfl: 0 .  carvedilol (COREG) 6.25 MG tablet, Take 1 tablet (6.25 mg total) by mouth 2 (two) times daily with a meal., Disp: 60 tablet, Rfl: 3 .  CRESTOR 20 MG tablet, Take 20 mg by mouth at bedtime. , Disp: , Rfl: 3 .  dofetilide (TIKOSYN) 125 MCG capsule, Take 125 mcg by mouth 2 (two) times daily., Disp: , Rfl:  .  doxycycline (VIBRA-TABS) 100 MG tablet, Take 100 mg by mouth 2 (two) times daily., Disp: , Rfl:  .  ELIQUIS 5 MG TABS tablet, Take 5 mg by mouth 2 (two) times daily., Disp: , Rfl: 0 .  furosemide (LASIX) 40 MG tablet, Take 20 mg by mouth daily., Disp: , Rfl:  .  gabapentin (NEURONTIN) 100 MG capsule, TAKE ONE CAPSULE EVERY 8 HOURS, Disp: , Rfl:  .  guaiFENesin (MUCINEX) 600 MG 12 hr tablet, Take 2 tablets (1,200 mg total) by mouth 2 (two) times daily., Disp: 60 tablet, Rfl: 0 .  lisinopril (PRINIVIL,ZESTRIL) 10 MG tablet, , Disp: , Rfl:  .  metoprolol tartrate (LOPRESSOR) 50 MG tablet, TAKE 1/2 TABLET EVERY 12 HOURS DAILY., Disp: , Rfl:  .  Multiple Vitamin (MULTIVITAMIN) capsule, Take 1 capsule by mouth daily., Disp: , Rfl:  .  niacin (NIASPAN) 1000 MG CR tablet, Take 1,000 mg by mouth every morning. , Disp: , Rfl:  .  Omega 3 1000 MG CAPS, Take 1,000 mg by mouth daily. , Disp: ,  Rfl:  .  polyethylene glycol (MIRALAX / GLYCOLAX) packet, Take 17 g by mouth daily as needed for moderate constipation., Disp: 30 each, Rfl: 0 .  potassium chloride SA (K-DUR,KLOR-CON) 20 MEQ tablet, Take 20 mEq by mouth daily., Disp: , Rfl:  .  Sodium Chloride-Sodium Bicarb (NETI POT SINUS WASH) 2300-700 MG KIT, Place 1 application into the nose daily as needed (for congestion)., Disp: , Rfl:  .  valACYclovir (VALTREX) 1000 MG tablet, Take 1,000 mg by mouth every 8 (eight) hours as needed., Disp: , Rfl:  .  Vitamin D, Cholecalciferol, 1000 UNITS TABS, Take 1,000 Units by mouth daily. , Disp: , Rfl:   Allergies  Allergen Reactions  . Contrast Media [Iodinated Diagnostic Agents] Rash    [facility] years ago at [facility]. Metrizamide Rash    [facility]. Amiodarone   . Ciprofloxacin   . Other Other (See Comments)    Patient preference not to eat "meat" or "diary" Patient preference not to eat "meat" or "diary"  . Tramadol Nausea And Vomiting    Objective:  Vascular Examination: Capillary refill time immediate x 10 digits  Dorsalis pedis and Posterior tibial pulses both 2/4 b/l  Digital hair absent x 10 digits  Skin temperature  gradient WNL b/l  Dermatological Examination: Skin thin and atrophic bilaterally  Toenails 1-5 b/l discolored, thick, dystrophic with subungual debris and pain with palpation to nailbeds due to thickness of nails.  No open wounds noted bilaterally  Musculoskeletal: Muscle strength 5/5 to all LE muscle groups  No gross bony deformities b/l.  No pain, crepitus or joint limitation noted with ROM.   Neurological: Sensation decreased with 10 gram monofilament.  Vibratory sensation absent bilaterally  Assessment: Painful onychomycosis toenails 1-5 b/l  Peripheral neuropathy On long-term blood thinner Eliquis  Plan: 1. Toenails 1-5 b/l were debrided in length and girth without iatrogenic  bleeding. 2. Patient to continue soft, supportive shoe gear 3. Patient to report any pedal injuries to medical professional immediately. 4. Follow up 3 months. Patient/POA to call should there be a concern in the interim.

## 2018-09-17 ENCOUNTER — Ambulatory Visit: Payer: Medicare Other | Admitting: Podiatry

## 2018-11-05 ENCOUNTER — Emergency Department (HOSPITAL_COMMUNITY)
Admission: EM | Admit: 2018-11-05 | Discharge: 2018-11-05 | Disposition: A | Discharge status: Home / Self Care | Payer: Medicare Other | Attending: Emergency Medicine | Admitting: Emergency Medicine

## 2018-11-05 ENCOUNTER — Emergency Department (HOSPITAL_COMMUNITY): Payer: Medicare Other

## 2018-11-05 ENCOUNTER — Encounter (HOSPITAL_COMMUNITY): Payer: Self-pay | Admitting: Emergency Medicine

## 2018-11-05 ENCOUNTER — Other Ambulatory Visit: Payer: Self-pay

## 2018-11-05 DIAGNOSIS — S79911A Unspecified injury of right hip, initial encounter: Secondary | ICD-10-CM | POA: Diagnosis not present

## 2018-11-05 DIAGNOSIS — R42 Dizziness and giddiness: Secondary | ICD-10-CM | POA: Diagnosis not present

## 2018-11-05 DIAGNOSIS — M25551 Pain in right hip: Secondary | ICD-10-CM | POA: Diagnosis not present

## 2018-11-05 DIAGNOSIS — Z79899 Other long term (current) drug therapy: Secondary | ICD-10-CM | POA: Diagnosis not present

## 2018-11-05 DIAGNOSIS — I259 Chronic ischemic heart disease, unspecified: Secondary | ICD-10-CM | POA: Insufficient documentation

## 2018-11-05 DIAGNOSIS — I5042 Chronic combined systolic (congestive) and diastolic (congestive) heart failure: Secondary | ICD-10-CM | POA: Insufficient documentation

## 2018-11-05 DIAGNOSIS — R52 Pain, unspecified: Secondary | ICD-10-CM | POA: Diagnosis not present

## 2018-11-05 DIAGNOSIS — I110 Hypertensive heart disease with heart failure: Secondary | ICD-10-CM | POA: Insufficient documentation

## 2018-11-05 DIAGNOSIS — Z7901 Long term (current) use of anticoagulants: Secondary | ICD-10-CM | POA: Insufficient documentation

## 2018-11-05 DIAGNOSIS — R55 Syncope and collapse: Secondary | ICD-10-CM

## 2018-11-05 DIAGNOSIS — I4891 Unspecified atrial fibrillation: Secondary | ICD-10-CM | POA: Diagnosis not present

## 2018-11-05 DIAGNOSIS — R4020 Unspecified coma: Secondary | ICD-10-CM | POA: Diagnosis not present

## 2018-11-05 LAB — CBC WITH DIFFERENTIAL/PLATELET
Abs Immature Granulocytes: 0.04 10*3/uL (ref 0.00–0.07)
Basophils Absolute: 0 10*3/uL (ref 0.0–0.1)
Basophils Relative: 0 %
Eosinophils Absolute: 0 10*3/uL (ref 0.0–0.5)
Eosinophils Relative: 0 %
HCT: 38.2 % — ABNORMAL LOW (ref 39.0–52.0)
Hemoglobin: 12.4 g/dL — ABNORMAL LOW (ref 13.0–17.0)
Immature Granulocytes: 0 %
Lymphocytes Relative: 10 %
Lymphs Abs: 1 10*3/uL (ref 0.7–4.0)
MCH: 32.4 pg (ref 26.0–34.0)
MCHC: 32.5 g/dL (ref 30.0–36.0)
MCV: 99.7 fL (ref 80.0–100.0)
Monocytes Absolute: 0.7 10*3/uL (ref 0.1–1.0)
Monocytes Relative: 7 %
Neutro Abs: 8.8 10*3/uL — ABNORMAL HIGH (ref 1.7–7.7)
Neutrophils Relative %: 83 %
Platelets: 146 10*3/uL — ABNORMAL LOW (ref 150–400)
RBC: 3.83 MIL/uL — ABNORMAL LOW (ref 4.22–5.81)
RDW: 12.7 % (ref 11.5–15.5)
WBC: 10.6 10*3/uL — ABNORMAL HIGH (ref 4.0–10.5)
nRBC: 0 % (ref 0.0–0.2)

## 2018-11-05 LAB — URINALYSIS, ROUTINE W REFLEX MICROSCOPIC
Bilirubin Urine: NEGATIVE
Glucose, UA: NEGATIVE mg/dL
Hgb urine dipstick: NEGATIVE
Ketones, ur: NEGATIVE mg/dL
Leukocytes,Ua: NEGATIVE
Nitrite: NEGATIVE
Protein, ur: NEGATIVE mg/dL
Specific Gravity, Urine: 1.013 (ref 1.005–1.030)
pH: 7 (ref 5.0–8.0)

## 2018-11-05 LAB — BASIC METABOLIC PANEL
Anion gap: 5 (ref 5–15)
BUN: 22 mg/dL (ref 8–23)
CO2: 25 mmol/L (ref 22–32)
Calcium: 8.7 mg/dL — ABNORMAL LOW (ref 8.9–10.3)
Chloride: 110 mmol/L (ref 98–111)
Creatinine, Ser: 0.85 mg/dL (ref 0.61–1.24)
GFR calc Af Amer: >60 mL/min (ref >60)
GFR calc non Af Amer: >60 mL/min (ref >60)
Glucose, Bld: 123 mg/dL — ABNORMAL HIGH (ref 70–99)
Potassium: 4.7 mmol/L (ref 3.5–5.1)
Sodium: 140 mmol/L (ref 135–145)

## 2018-11-05 LAB — CBG MONITORING, ED: Glucose-Capillary: 102 mg/dL — ABNORMAL HIGH (ref 70–99)

## 2018-11-05 LAB — TYPE AND SCREEN
ABO/RH(D): O POS
Antibody Screen: NEGATIVE

## 2018-11-05 MED ORDER — SODIUM CHLORIDE 0.9 % IV BOLUS
500.0000 mL | Freq: Once | INTRAVENOUS | Status: AC
  Administered 2018-11-05: 500 mL via INTRAVENOUS

## 2018-11-05 MED ORDER — SODIUM CHLORIDE 0.9 % IV SOLN
INTRAVENOUS | Status: DC
  Administered 2018-11-05: 18:00:00 via INTRAVENOUS

## 2018-11-05 NOTE — ED Triage Notes (Signed)
Per [facility] pt coming from home. Patient states he was standing in the bathroom shaving when he felt nauseas and light headed. His wife helped lower him to the ground and he became very pale. Patient states he does not think he passed out but wife states patient lost consciousness for about a minute. Patient had one episode of emesis PTA. Patient adds that he fell last night causing pain in his right hip, but able to walk on it.

## 2018-11-05 NOTE — ED Notes (Signed)
Patient ambulated in room and into hallway using walker. Patient states it is very painful to put any weight on right leg.

## 2018-11-05 NOTE — ED Notes (Addendum)
Pt stands on own ability. Has some right hip pain from fall yesterday. Steady on feet with no light-headed feelings.

## 2018-11-05 NOTE — Discharge Instructions (Addendum)
Make sure to stay well-hydrated.  Return to the emergency room if you start having trouble with fevers, shortness of breath chest pain, or other concerning symptoms.  Follow-up with your doctor to be rechecked

## 2018-11-05 NOTE — ED Provider Notes (Signed)
[facility] EMERGENCY DEPARTMENT Provider Note   CSN: [id number] Arrival date & time: 11/05/18  1627    History   Chief Complaint Chief Complaint  Patient presents with  . Loss of Consciousness    HPI Edgar Thomas is a 85 y.o. male.     HPI Patient presented to the wound clinic today for evaluation of a syncopal episode.  Patient states he is showering and shaving when he suddenly started to feel nauseated and lightheaded.  Patient felt like he was going to pass out.  He called for his wife because he felt like he had to get to the bed.  Patient does not think that he passed out but his wife told EMS that he did lose consciousness for a minute or 2.  EMS noted his blood pressure was low initially and they give him IV fluids.  Patient now states he is feeling better.  Denies any headache.  No chest pain or abdominal pain.  No vomiting or diarrhea.  No numbness or weakness. Past Medical History:  Diagnosis Date  . A-fib ([place])   . CAD (coronary artery disease)   . Constipation   . GERD (gastroesophageal reflux disease)   . Heart attack ([place])   . Heartburn   . Hyperlipemia   . Hypertension     Patient Active Problem List   Diagnosis Date Noted  . Pseudophakia 03/06/2018  . S/P laser cataract surgery 03/06/2018  . AMD (age-related macular degeneration), bilateral 02/26/2018  . Combined form of senile cataract 02/17/2018  . Herpes zoster with complication 27/07/5007  . UTI (urinary tract infection) 02/18/2016  . Acute on chronic combined systolic and diastolic congestive heart failure ([place]) 02/17/2016  . CHF (congestive heart failure) ([place]) 02/17/2016  . Gait disturbance 02/17/2016  . Near syncope 09/03/2015  . Hyponatremia 09/03/2015  . Bronchitis 08/29/2015  . Encounter for therapeutic drug monitoring 12/12/2013  . Anticoagulated on warfarin 12/10/2013  . Anticoagulated 12/10/2013  . Cardiomyopathy, ischemic- EF 40-45% 12/09/2013  . Atrial fibrillation  with RVR ([place]) 12/05/2013  . CAD- last PCI [facility] yrs ago at [facility] 12/05/2013  . GERD (gastroesophageal reflux disease)   . Hyperlipemia   . 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 Leg pain 06/13/2012    Past Surgical History:  Procedure Laterality Date  . balloon angioplasty of LAD    . COLONOSCOPY    . CORONARY ARTERY BYPASS GRAFT N/A 12/03/2013   Procedure: CORONARY ARTERY BYPASS GRAFTING (CABG) x4 using left internal mammary artery and right greater saphenous vein. LIMA to LAD, sequential SVG to OM 1 & OM 2, SVG to PD;  Surgeon: Grace Isaac, MD;  Location: [facility];  Service: Open Heart Surgery;  Laterality: N/A;  . ENDOVEIN HARVEST OF GREATER SAPHENOUS VEIN Right 12/03/2013   Procedure: ENDOVEIN HARVEST OF GREATER SAPHENOUS VEIN;  Surgeon: Grace Isaac, MD;  Location: [facility];  Service: Open Heart Surgery;  Laterality: Right;  . INTRAOPERATIVE TRANSESOPHAGEAL ECHOCARDIOGRAM N/A 12/03/2013   Procedure: INTRAOPERATIVE TRANSESOPHAGEAL ECHOCARDIOGRAM;  Surgeon: Grace Isaac, MD;  Location: [facility];  Service: Open Heart Surgery;  Laterality: N/A;        Home Medications    Prior to Admission medications   Medication Sig Start Date End Date Taking? Authorizing Provider  amiodarone (PACERONE) 200 MG tablet Take 439m (2 tablets) twice a day for 1 week, then continue 2074m(1 tab) twice a day thereafter 08/30/15   Rai, RiVernelle EmeraldMD  amoxicillin-clavulanate (AUGMENTIN)  875-125 MG tablet Take 1 tablet by mouth 2 (two) times daily. 08/30/15   Rai, Ripudeep Raliegh Ip, MD  benzonatate (TESSALON) 100 MG capsule Take 1 capsule (100 mg total) by mouth 3 (three) times daily as needed for cough. 08/30/15   Rai, Vernelle Emerald, MD  carvedilol (COREG) 6.25 MG tablet Take 1 tablet (6.25 mg total) by mouth 2 (two) times daily with a meal. 08/30/15   Rai, Ripudeep K, MD  CRESTOR 20 MG tablet Take 20 mg by mouth at bedtime.  08/26/14   [provider]  dofetilide (TIKOSYN) 125 MCG capsule Take 125 mcg by mouth 2 (two)  times daily. 05/20/18   [provider]  doxycycline (VIBRA-TABS) 100 MG tablet Take 100 mg by mouth 2 (two) times daily. 05/08/18   [provider]  ELIQUIS 5 MG TABS tablet Take 5 mg by mouth 2 (two) times daily. 08/25/15   [provider]  furosemide (LASIX) 40 MG tablet Take 20 mg by mouth daily. 03/27/18   [provider]  gabapentin (NEURONTIN) 100 MG capsule TAKE ONE CAPSULE EVERY 8 HOURS 04/19/18   [provider]  guaiFENesin (MUCINEX) 600 MG 12 hr tablet Take 2 tablets (1,200 mg total) by mouth 2 (two) times daily. 08/30/15   Rai, Vernelle Emerald, MD  lisinopril (PRINIVIL,ZESTRIL) 10 MG tablet  06/14/18   [provider]  metoprolol tartrate (LOPRESSOR) 50 MG tablet TAKE 1/2 TABLET EVERY 12 HOURS DAILY. 04/02/18   [provider]  Multiple Vitamin (MULTIVITAMIN) capsule Take 1 capsule by mouth daily.    [provider]  niacin (NIASPAN) 1000 MG CR tablet Take 1,000 mg by mouth every morning.     [provider]  Omega 3 1000 MG CAPS Take 1,000 mg by mouth daily.     [provider]  polyethylene glycol (MIRALAX / GLYCOLAX) packet Take 17 g by mouth daily as needed for moderate constipation. 08/30/15   Rai, Ripudeep K, MD  potassium chloride SA (K-DUR,KLOR-CON) 20 MEQ tablet Take 20 mEq by mouth daily. 05/20/18   [provider]  Sodium Chloride-Sodium Bicarb (NETI POT SINUS [place]) 2300-700 MG KIT Place 1 application into the nose daily as needed (for congestion).    [provider]  valACYclovir (VALTREX) 1000 MG tablet Take 1,000 mg by mouth every 8 (eight) hours as needed. 03/13/18   [provider]  Vitamin D, Cholecalciferol, 1000 UNITS TABS Take 1,000 Units by mouth daily.     [provider]    Family History Family History  Problem Relation Age of Onset  . Heart disease Mother        died at age 60 of heart attack    Social History Social History   Tobacco Use   . Smoking status: Never Smoker  . Smokeless tobacco: Never Used  Substance Use Topics  . Alcohol use: No  . Drug use: No     Allergies   Contrast media [iodinated diagnostic agents]; Metrizamide; Amiodarone; Ciprofloxacin; Other; and Tramadol   Review of Systems Review of Systems  All other systems reviewed and are negative.    Physical Exam Updated Vital Signs BP (!) 141/82   Pulse (!) 51   Temp (!) 97.4 F (36.3 C) (Oral)   Resp 15   Ht 1.791 m (5' 10.5")   Wt 72.6 kg   SpO2 99%   BMI 22.63 kg/m   Physical Exam Vitals signs and nursing note reviewed.  Constitutional:      General:  He is not in acute distress.    Appearance: He is well-developed.  HENT:     Head: Normocephalic and atraumatic.     Right Ear: External ear normal.     Left Ear: External ear normal.  Eyes:     General: No scleral icterus.       Right eye: No discharge.        Left eye: No discharge.     Conjunctiva/sclera: Conjunctivae normal.  Neck:     Musculoskeletal: Neck supple.     Trachea: No tracheal deviation.  Cardiovascular:     Rate and Rhythm: Normal rate and regular rhythm.  Pulmonary:     Effort: Pulmonary effort is normal. No respiratory distress.     Breath sounds: Normal breath sounds. No stridor. No wheezing or rales.  Abdominal:     General: Bowel sounds are normal. There is no distension.     Palpations: Abdomen is soft.     Tenderness: There is no abdominal tenderness. There is no guarding or rebound.  Musculoskeletal:        General: No tenderness.     Comments: Bruising noted around the right ankle, mild tenderness palpation right hip  Skin:    General: Skin is warm and dry.     Findings: No rash.  Neurological:     Mental Status: He is alert.     Cranial Nerves: No cranial nerve deficit (no facial droop, extraocular movements intact, no slurred speech).     Sensory: No sensory deficit.     Motor: No abnormal muscle tone or seizure activity.     Coordination:  Coordination normal.      ED Treatments / Results  Labs (all labs ordered are listed, but only abnormal results are displayed) Labs Reviewed  BASIC METABOLIC PANEL - Abnormal; Notable for the following components:      Result Value   Glucose, Bld 123 (*)    Calcium 8.7 (*)    All other components within normal limits  CBC WITH DIFFERENTIAL/PLATELET - Abnormal; Notable for the following components:   WBC 10.6 (*)    RBC 3.83 (*)    Hemoglobin 12.4 (*)    HCT 38.2 (*)    Platelets 146 (*)    Neutro Abs 8.8 (*)    All other components within normal limits  CBG MONITORING, ED - Abnormal; Notable for the following components:   Glucose-Capillary 102 (*)    All other components within normal limits  URINALYSIS, ROUTINE W REFLEX MICROSCOPIC  CBG MONITORING, ED  TYPE AND SCREEN    EKG EKG Interpretation  Date/Time:  Tuesday November 05 2018 16:34:49 EDT Ventricular Rate:  59 PR Interval:    QRS Duration: 128 QT Interval:  484 QTC Calculation: 480 R Axis:   -61 Text Interpretation:  Sinus rhythm Atrial premature complex Left bundle branch block lateral t wave changes since last tracing Confirmed by Dorie Rank [phone]) on 11/05/2018 4:49:23 PM   Radiology Dg Chest 1 View  Result Date: 11/05/2018 CLINICAL DATA:  Near-syncope. EXAM: CHEST  1 VIEW COMPARISON:  Chest x-ray dated March 19, 2017. FINDINGS: Stable mild cardiomegaly status post CABG. Normal mediastinal contours. Atherosclerotic calcification of the aortic arch. Normal pulmonary vascularity. No focal consolidation, pleural effusion, or pneumothorax. No acute osseous abnormality. IMPRESSION: No active disease. Electronically Signed   By: Titus Dubin M.D.   On: 11/05/2018 17:40   Dg Hip Unilat W Or Wo Pelvis 2-3 Views Right  Result Date: 11/05/2018 CLINICAL  DATA:  Right hip pain after fall last night. EXAM: DG HIP (WITH OR WITHOUT PELVIS) 2-3V RIGHT COMPARISON:  None. FINDINGS: There is no evidence of hip fracture or  dislocation. There is no evidence of arthropathy or other focal bone abnormality. IMPRESSION: Negative. Electronically Signed   By: Titus Dubin M.D.   On: 11/05/2018 17:41    Procedures Procedures (including critical care time)  Medications Ordered in ED Medications  sodium chloride 0.9 % bolus 500 mL (0 mLs Intravenous Stopped 11/05/18 1750)    And  0.9 %  sodium chloride infusion ( Intravenous Stopped 11/05/18 1857)     Initial Impression / Assessment and Plan / ED Course  I have reviewed the triage vital signs and the nursing notes.  Pertinent labs & imaging results that were available during my care of the patient were reviewed by me and considered in my medical decision making (see chart for details).    Presented to the emergency room for evaluation of either a syncopal or near syncopal episode.  Patient did have a prodrome of nausea.  Patient has remained asymptomatic here in the emergency room.  No cardiac dysrhythmia.  He has no neurologic deficits.  He is not having any trouble with pain or fever.  His ED work-up is reassuring.  I doubt acute coronary syndrome.  I doubt stroke or TIA.  I doubt cardiac dysrhythmia although certainly cannot completely exclude that.  I think it is possible the patient did have an episode of vasovagal syncope.  This also may be associated with a hydration.  Patient was able to ambulate around the ED without difficulty.  He is stable for discharge and feels comfortable going home.    Final Clinical Impressions(s) / ED Diagnoses   Final diagnoses:  Near syncope    ED Discharge Orders    None       Dorie Rank, MD 11/05/18 1902

## 2018-11-06 ENCOUNTER — Telehealth (HOSPITAL_COMMUNITY): Payer: Self-pay

## 2018-11-06 NOTE — Telephone Encounter (Signed)
The patients wife also reported they are awaiting a return call from their PCP.   They wanted to speak to them prior to coming back to the ED for a reevaluation.  The patient denies any chest pain or sob.  He reports having severe Rt. Hip pain and unable to bear weight without assistance .They both verbalized understanding and will be waiting to hear from the Pt.s PCP.

## 2018-11-07 DIAGNOSIS — I2510 Atherosclerotic heart disease of native coronary artery without angina pectoris: Secondary | ICD-10-CM | POA: Diagnosis not present

## 2018-11-07 DIAGNOSIS — I4891 Unspecified atrial fibrillation: Secondary | ICD-10-CM | POA: Diagnosis not present

## 2018-11-07 DIAGNOSIS — T148XXA Other injury of unspecified body region, initial encounter: Secondary | ICD-10-CM | POA: Diagnosis not present

## 2018-11-07 DIAGNOSIS — R600 Localized edema: Secondary | ICD-10-CM | POA: Diagnosis not present

## 2018-11-08 ENCOUNTER — Encounter (HOSPITAL_COMMUNITY): Payer: Self-pay

## 2018-11-08 ENCOUNTER — Emergency Department (HOSPITAL_COMMUNITY): Payer: Medicare Other

## 2018-11-08 ENCOUNTER — Emergency Department (HOSPITAL_COMMUNITY)
Admission: EM | Admit: 2018-11-08 | Discharge: 2018-11-08 | Disposition: A | Discharge status: Home / Self Care | Payer: Medicare Other | Source: Home / Self Care | Attending: Emergency Medicine | Admitting: Emergency Medicine

## 2018-11-08 ENCOUNTER — Other Ambulatory Visit: Payer: Self-pay

## 2018-11-08 ENCOUNTER — Inpatient Hospital Stay (HOSPITAL_COMMUNITY)
Admission: EM | Admit: 2018-11-08 | Discharge: 2018-11-12 | DRG: 812 | Disposition: A | Discharge status: Home / Self Care | Payer: Medicare Other | Attending: Family Medicine | Admitting: Family Medicine

## 2018-11-08 DIAGNOSIS — I252 Old myocardial infarction: Secondary | ICD-10-CM

## 2018-11-08 DIAGNOSIS — Y92009 Unspecified place in unspecified non-institutional (private) residence as the place of occurrence of the external cause: Secondary | ICD-10-CM | POA: Diagnosis not present

## 2018-11-08 DIAGNOSIS — K219 Gastro-esophageal reflux disease without esophagitis: Secondary | ICD-10-CM | POA: Diagnosis not present

## 2018-11-08 DIAGNOSIS — S300XXA Contusion of lower back and pelvis, initial encounter: Secondary | ICD-10-CM | POA: Diagnosis not present

## 2018-11-08 DIAGNOSIS — R4020 Unspecified coma: Secondary | ICD-10-CM | POA: Diagnosis not present

## 2018-11-08 DIAGNOSIS — Z1159 Encounter for screening for other viral diseases: Secondary | ICD-10-CM

## 2018-11-08 DIAGNOSIS — W010XXA Fall on same level from slipping, tripping and stumbling without subsequent striking against object, initial encounter: Secondary | ICD-10-CM | POA: Diagnosis present

## 2018-11-08 DIAGNOSIS — R55 Syncope and collapse: Secondary | ICD-10-CM

## 2018-11-08 DIAGNOSIS — R9082 White matter disease, unspecified: Secondary | ICD-10-CM | POA: Diagnosis present

## 2018-11-08 DIAGNOSIS — Z951 Presence of aortocoronary bypass graft: Secondary | ICD-10-CM | POA: Insufficient documentation

## 2018-11-08 DIAGNOSIS — I080 Rheumatic disorders of both mitral and aortic valves: Secondary | ICD-10-CM | POA: Diagnosis present

## 2018-11-08 DIAGNOSIS — Z7982 Long term (current) use of aspirin: Secondary | ICD-10-CM

## 2018-11-08 DIAGNOSIS — I2510 Atherosclerotic heart disease of native coronary artery without angina pectoris: Secondary | ICD-10-CM | POA: Diagnosis present

## 2018-11-08 DIAGNOSIS — S7001XA Contusion of right hip, initial encounter: Secondary | ICD-10-CM

## 2018-11-08 DIAGNOSIS — S79911A Unspecified injury of right hip, initial encounter: Secondary | ICD-10-CM | POA: Diagnosis not present

## 2018-11-08 DIAGNOSIS — I4891 Unspecified atrial fibrillation: Secondary | ICD-10-CM | POA: Diagnosis not present

## 2018-11-08 DIAGNOSIS — Z8249 Family history of ischemic heart disease and other diseases of the circulatory system: Secondary | ICD-10-CM

## 2018-11-08 DIAGNOSIS — Z7901 Long term (current) use of anticoagulants: Secondary | ICD-10-CM

## 2018-11-08 DIAGNOSIS — E785 Hyperlipidemia, unspecified: Secondary | ICD-10-CM | POA: Diagnosis not present

## 2018-11-08 DIAGNOSIS — H3530 Unspecified macular degeneration: Secondary | ICD-10-CM | POA: Diagnosis not present

## 2018-11-08 DIAGNOSIS — M7989 Other specified soft tissue disorders: Secondary | ICD-10-CM | POA: Diagnosis not present

## 2018-11-08 DIAGNOSIS — S79921A Unspecified injury of right thigh, initial encounter: Secondary | ICD-10-CM | POA: Diagnosis not present

## 2018-11-08 DIAGNOSIS — Z881 Allergy status to other antibiotic agents status: Secondary | ICD-10-CM

## 2018-11-08 DIAGNOSIS — W19XXXA Unspecified fall, initial encounter: Secondary | ICD-10-CM | POA: Diagnosis present

## 2018-11-08 DIAGNOSIS — D62 Acute posthemorrhagic anemia: Principal | ICD-10-CM | POA: Diagnosis present

## 2018-11-08 DIAGNOSIS — I447 Left bundle-branch block, unspecified: Secondary | ICD-10-CM | POA: Diagnosis not present

## 2018-11-08 DIAGNOSIS — I480 Paroxysmal atrial fibrillation: Secondary | ICD-10-CM | POA: Diagnosis not present

## 2018-11-08 DIAGNOSIS — I509 Heart failure, unspecified: Secondary | ICD-10-CM | POA: Insufficient documentation

## 2018-11-08 DIAGNOSIS — I5022 Chronic systolic (congestive) heart failure: Secondary | ICD-10-CM | POA: Diagnosis not present

## 2018-11-08 DIAGNOSIS — S299XXA Unspecified injury of thorax, initial encounter: Secondary | ICD-10-CM | POA: Diagnosis not present

## 2018-11-08 DIAGNOSIS — Z79899 Other long term (current) drug therapy: Secondary | ICD-10-CM | POA: Insufficient documentation

## 2018-11-08 DIAGNOSIS — I951 Orthostatic hypotension: Secondary | ICD-10-CM | POA: Diagnosis not present

## 2018-11-08 DIAGNOSIS — Z03818 Encounter for observation for suspected exposure to other biological agents ruled out: Secondary | ICD-10-CM | POA: Diagnosis not present

## 2018-11-08 DIAGNOSIS — Z91041 Radiographic dye allergy status: Secondary | ICD-10-CM

## 2018-11-08 DIAGNOSIS — Y929 Unspecified place or not applicable: Secondary | ICD-10-CM | POA: Insufficient documentation

## 2018-11-08 DIAGNOSIS — Y999 Unspecified external cause status: Secondary | ICD-10-CM | POA: Insufficient documentation

## 2018-11-08 DIAGNOSIS — I4819 Other persistent atrial fibrillation: Secondary | ICD-10-CM | POA: Diagnosis not present

## 2018-11-08 DIAGNOSIS — I10 Essential (primary) hypertension: Secondary | ICD-10-CM | POA: Diagnosis not present

## 2018-11-08 DIAGNOSIS — Z961 Presence of intraocular lens: Secondary | ICD-10-CM | POA: Diagnosis present

## 2018-11-08 DIAGNOSIS — I25708 Atherosclerosis of coronary artery bypass graft(s), unspecified, with other forms of angina pectoris: Secondary | ICD-10-CM | POA: Diagnosis not present

## 2018-11-08 DIAGNOSIS — R52 Pain, unspecified: Secondary | ICD-10-CM | POA: Diagnosis not present

## 2018-11-08 DIAGNOSIS — Y939 Activity, unspecified: Secondary | ICD-10-CM | POA: Insufficient documentation

## 2018-11-08 DIAGNOSIS — M25551 Pain in right hip: Secondary | ICD-10-CM | POA: Diagnosis not present

## 2018-11-08 DIAGNOSIS — I110 Hypertensive heart disease with heart failure: Secondary | ICD-10-CM | POA: Diagnosis present

## 2018-11-08 DIAGNOSIS — S7011XA Contusion of right thigh, initial encounter: Secondary | ICD-10-CM | POA: Diagnosis not present

## 2018-11-08 DIAGNOSIS — D649 Anemia, unspecified: Secondary | ICD-10-CM | POA: Diagnosis present

## 2018-11-08 DIAGNOSIS — Z888 Allergy status to other drugs, medicaments and biological substances status: Secondary | ICD-10-CM | POA: Diagnosis not present

## 2018-11-08 DIAGNOSIS — R0902 Hypoxemia: Secondary | ICD-10-CM | POA: Diagnosis not present

## 2018-11-08 LAB — CBC
HCT: 23.6 % — ABNORMAL LOW (ref 39.0–52.0)
Hemoglobin: 7.9 g/dL — ABNORMAL LOW (ref 13.0–17.0)
MCH: 33.3 pg (ref 26.0–34.0)
MCHC: 33.5 g/dL (ref 30.0–36.0)
MCV: 99.6 fL (ref 80.0–100.0)
Platelets: 149 10*3/uL — ABNORMAL LOW (ref 150–400)
RBC: 2.37 MIL/uL — ABNORMAL LOW (ref 4.22–5.81)
RDW: 12.9 % (ref 11.5–15.5)
WBC: 10.7 10*3/uL — ABNORMAL HIGH (ref 4.0–10.5)
nRBC: 0 % (ref 0.0–0.2)

## 2018-11-08 LAB — CBC WITH DIFFERENTIAL/PLATELET
Abs Immature Granulocytes: 0.06 10*3/uL (ref 0.00–0.07)
Basophils Absolute: 0 10*3/uL (ref 0.0–0.1)
Basophils Relative: 0 %
Eosinophils Absolute: 0 10*3/uL (ref 0.0–0.5)
Eosinophils Relative: 0 %
HCT: 24.4 % — ABNORMAL LOW (ref 39.0–52.0)
Hemoglobin: 7.9 g/dL — ABNORMAL LOW (ref 13.0–17.0)
Immature Granulocytes: 1 %
Lymphocytes Relative: 8 %
Lymphs Abs: 0.8 10*3/uL (ref 0.7–4.0)
MCH: 33.2 pg (ref 26.0–34.0)
MCHC: 32.4 g/dL (ref 30.0–36.0)
MCV: 102.5 fL — ABNORMAL HIGH (ref 80.0–100.0)
Monocytes Absolute: 1.5 10*3/uL — ABNORMAL HIGH (ref 0.1–1.0)
Monocytes Relative: 14 %
Neutro Abs: 7.9 10*3/uL — ABNORMAL HIGH (ref 1.7–7.7)
Neutrophils Relative %: 77 %
Platelets: 142 10*3/uL — ABNORMAL LOW (ref 150–400)
RBC: 2.38 MIL/uL — ABNORMAL LOW (ref 4.22–5.81)
RDW: 13 % (ref 11.5–15.5)
WBC: 10.4 10*3/uL (ref 4.0–10.5)
nRBC: 0 % (ref 0.0–0.2)

## 2018-11-08 LAB — COMPREHENSIVE METABOLIC PANEL
ALT: 13 U/L (ref 0–44)
AST: 20 U/L (ref 15–41)
Albumin: 2.6 g/dL — ABNORMAL LOW (ref 3.5–5.0)
Alkaline Phosphatase: 44 U/L (ref 38–126)
Anion gap: 6 (ref 5–15)
BUN: 22 mg/dL (ref 8–23)
CO2: 24 mmol/L (ref 22–32)
Calcium: 8.1 mg/dL — ABNORMAL LOW (ref 8.9–10.3)
Chloride: 107 mmol/L (ref 98–111)
Creatinine, Ser: 0.91 mg/dL (ref 0.61–1.24)
GFR calc Af Amer: >60 mL/min (ref >60)
GFR calc non Af Amer: >60 mL/min (ref >60)
Glucose, Bld: 138 mg/dL — ABNORMAL HIGH (ref 70–99)
Potassium: 4.1 mmol/L (ref 3.5–5.1)
Sodium: 137 mmol/L (ref 135–145)
Total Bilirubin: 1.3 mg/dL — ABNORMAL HIGH (ref 0.3–1.2)
Total Protein: 5.4 g/dL — ABNORMAL LOW (ref 6.5–8.1)

## 2018-11-08 LAB — CK: Total CK: 110 U/L (ref 49–397)

## 2018-11-08 LAB — I-STAT TROPONIN, ED: Troponin i, poc: 0.03 ng/mL (ref 0.00–0.08)

## 2018-11-08 LAB — PREPARE RBC (CROSSMATCH)

## 2018-11-08 MED ORDER — ROSUVASTATIN CALCIUM 20 MG PO TABS
20.0000 mg | ORAL_TABLET | Freq: Every morning | ORAL | Status: DC
  Administered 2018-11-09 – 2018-11-12 (×4): 20 mg via ORAL
  Filled 2018-11-08 (×4): qty 1

## 2018-11-08 MED ORDER — ACETAMINOPHEN 325 MG PO TABS
650.0000 mg | ORAL_TABLET | Freq: Four times a day (QID) | ORAL | Status: DC | PRN
  Administered 2018-11-10 – 2018-11-12 (×3): 650 mg via ORAL
  Filled 2018-11-08 (×3): qty 2

## 2018-11-08 MED ORDER — NITROGLYCERIN 0.4 MG SL SUBL: 0.4000 mg | SUBLINGUAL_TABLET | SUBLINGUAL | Status: DC | PRN

## 2018-11-08 MED ORDER — GABAPENTIN 100 MG PO CAPS
100.0000 mg | ORAL_CAPSULE | Freq: Two times a day (BID) | ORAL | Status: DC
  Administered 2018-11-08 – 2018-11-12 (×8): 100 mg via ORAL
  Filled 2018-11-08 (×8): qty 1

## 2018-11-08 MED ORDER — POLYETHYLENE GLYCOL 3350 17 G PO PACK
17.0000 g | PACK | Freq: Every day | ORAL | Status: DC | PRN
  Administered 2018-11-10 – 2018-11-11 (×2): 17 g via ORAL
  Filled 2018-11-08 (×2): qty 1

## 2018-11-08 MED ORDER — ACETAMINOPHEN 325 MG PO TABS
650.0000 mg | ORAL_TABLET | Freq: Once | ORAL | Status: AC
  Administered 2018-11-08: 650 mg via ORAL
  Filled 2018-11-08: qty 2

## 2018-11-08 MED ORDER — SODIUM CHLORIDE 0.9 % IV BOLUS
1000.0000 mL | Freq: Once | INTRAVENOUS | Status: AC
  Administered 2018-11-08: 1000 mL via INTRAVENOUS

## 2018-11-08 MED ORDER — SODIUM CHLORIDE 0.9% IV SOLUTION
Freq: Once | INTRAVENOUS | Status: AC
  Administered 2018-11-08: via INTRAVENOUS

## 2018-11-08 MED ORDER — ONDANSETRON HCL 4 MG/2ML IJ SOLN: 4.0000 mg | Freq: Four times a day (QID) | INTRAMUSCULAR | Status: DC | PRN

## 2018-11-08 MED ORDER — ONDANSETRON HCL 4 MG PO TABS: 4.0000 mg | ORAL_TABLET | Freq: Four times a day (QID) | ORAL | Status: DC | PRN

## 2018-11-08 MED ORDER — DOFETILIDE 125 MCG PO CAPS
125.0000 ug | ORAL_CAPSULE | Freq: Two times a day (BID) | ORAL | Status: DC
  Administered 2018-11-08 – 2018-11-12 (×8): 125 ug via ORAL
  Filled 2018-11-08 (×10): qty 1

## 2018-11-08 MED ORDER — ACETAMINOPHEN 650 MG RE SUPP: 650.0000 mg | Freq: Four times a day (QID) | RECTAL | Status: DC | PRN

## 2018-11-08 NOTE — ED Notes (Signed)
Pt's sps. Juanita Laster [phone].

## 2018-11-08 NOTE — Discharge Instructions (Signed)
If you cannot walk, develop weakness or numbness of your leg or foot, or have any other new/concerning symptoms then return to the ER for evaluation

## 2018-11-08 NOTE — ED Triage Notes (Signed)
Pt arrives following syncopal episode after being taken home from this facility earlier today. Pt's BP was 82/38 for [facility] and came up to 110/68 after 1000 fluid. Per EMS, pt was "flacid" with "thousand yard stare" but alert and appropriate for EMS but stroke screen was negative. CBG 200.  Pt is oriented to person, place, and situation, disoriented to time

## 2018-11-08 NOTE — ED Notes (Addendum)
Pt wife [name] [phone]

## 2018-11-08 NOTE — Progress Notes (Signed)
Edgar Thomas [id number] Admission Data: 11/09/2018 12:08 AM Attending Provider: Martyn Malay, MD  PCP:Lee, Joylene Igo, MD Consults/ Treatment Team: Treatment Team:  Shona Needles, MD  Edgar Thomas is a 85 y.o. male patient admitted from ED awake, alert  & orientated  X 3,  Full Code, VSS - Blood pressure (!) 142/59, pulse 71, temperature 98.7 F (37.1 C), temperature source Oral, resp. rate 16, height 5\' 10"  (1.778 m), weight 74.7 kg, SpO2 100 %., 21% O2    no c/o shortness of breath, no c/o chest pain, no distress noted. Tele #34 placed and pt is currently running:normal sinus rhythm.   IV site WDL:  forearm right, condition patent and no redness with a transparent dsg that's clean dry and intact.  Allergies:   Allergies  Allergen Reactions  . Contrast Media [Iodinated Diagnostic Agents] Rash    19 years ago at [facility]  . Metrizamide Rash    19 years ago at [facility] ([place])  . Other Other (See Comments)    Patient preference: NO MEAT OR DAIRY!!  . Amiodarone Other (See Comments)    Makes the patient walk sideways and he falls  . Ciprofloxacin Other (See Comments)    Was told by MD to "not take"  . Tramadol Nausea And Vomiting     Past Medical History:  Diagnosis Date  . A-fib ([place])   . CAD (coronary artery disease)   . Constipation   . GERD (gastroesophageal reflux disease)   . Heart attack ([place])   . Heartburn   . Hyperlipemia   . Hypertension     Pt orientation to unit, room and routine. Information packet given to patient/family and safety video watched.  Admission INP armband ID verified with patient/family, and in place. SR up x 2, fall risk assessment complete with Patient and family verbalizing understanding of risks associated with falls. Pt verbalizes an understanding of how to use the call bell and to call for help before getting out of bed. No evidence of skin break down noted on exam.     Will cont to monitor and assist as  needed.  Edgar N Moishe Schellenberg, RN 11/09/2018 12:08 AM

## 2018-11-08 NOTE — ED Provider Notes (Signed)
[facility] EMERGENCY DEPARTMENT Provider Note   CSN: [id number] Arrival date & time: 11/08/18  0830    History   Chief Complaint Chief Complaint  Patient presents with  . Fall    HPI Edgar Thomas is a 85 y.o. male.     HPI  85 year old male presents with continued right lateral hip pain.  He fell on 6/9 and has had continued pain since.  He states that the pain is not that bad right now and when he took Tylenol last night he was able to sleep.  However whenever he stands on it it is extremely painful.  He can bear weight a little bit but is so painful that he gets near syncopal whenever he puts weight on it due to the pain.  No further falls.  No chest pain, vomiting, diarrhea.  No weakness or numbness. Has not taken anything this morning.  Past Medical History:  Diagnosis Date  . A-fib ([place])   . CAD (coronary artery disease)   . Constipation   . GERD (gastroesophageal reflux disease)   . Heart attack ([place])   . Heartburn   . Hyperlipemia   . Hypertension     Patient Active Problem List   Diagnosis Date Noted  . Pseudophakia 03/06/2018  . S/P laser cataract surgery 03/06/2018  . AMD (age-related macular degeneration), bilateral 02/26/2018  . Combined form of senile cataract 02/17/2018  . Herpes zoster with complication 62/69/4854  . UTI (urinary tract infection) 02/18/2016  . Acute on chronic combined systolic and diastolic congestive heart failure ([place]) 02/17/2016  . CHF (congestive heart failure) ([place]) 02/17/2016  . Gait disturbance 02/17/2016  . Near syncope 09/03/2015  . Hyponatremia 09/03/2015  . Bronchitis 08/29/2015  . Encounter for therapeutic drug monitoring 12/12/2013  . Anticoagulated on warfarin 12/10/2013  . Anticoagulated 12/10/2013  . Cardiomyopathy, ischemic- EF 40-45% 12/09/2013  . Atrial fibrillation with RVR ([place]) 12/05/2013  . CAD- last PCI [facility] yrs ago at [facility] 12/05/2013  . GERD (gastroesophageal reflux disease)   .  Hyperlipemia   . 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 Leg pain 06/13/2012    Past Surgical History:  Procedure Laterality Date  . balloon angioplasty of LAD    . COLONOSCOPY    . CORONARY ARTERY BYPASS GRAFT N/A 12/03/2013   Procedure: CORONARY ARTERY BYPASS GRAFTING (CABG) x4 using left internal mammary artery and right greater saphenous vein. LIMA to LAD, sequential SVG to OM 1 & OM 2, SVG to PD;  Surgeon: Grace Isaac, MD;  Location: [facility];  Service: Open Heart Surgery;  Laterality: N/A;  . ENDOVEIN HARVEST OF GREATER SAPHENOUS VEIN Right 12/03/2013   Procedure: ENDOVEIN HARVEST OF GREATER SAPHENOUS VEIN;  Surgeon: Grace Isaac, MD;  Location: [facility];  Service: Open Heart Surgery;  Laterality: Right;  . INTRAOPERATIVE TRANSESOPHAGEAL ECHOCARDIOGRAM N/A 12/03/2013   Procedure: INTRAOPERATIVE TRANSESOPHAGEAL ECHOCARDIOGRAM;  Surgeon: Grace Isaac, MD;  Location: [facility];  Service: Open Heart Surgery;  Laterality: N/A;        Home Medications    Prior to Admission medications   Medication Sig Start Date End Date Taking? Authorizing Provider  amiodarone (PACERONE) 200 MG tablet Take 490m (2 tablets) twice a day for 1 week, then continue 2057m(1 tab) twice a day thereafter 08/30/15   Rai, RiVernelle EmeraldMD  amoxicillin-clavulanate (AUGMENTIN) 875-125 MG tablet Take 1 tablet by mouth 2 (two) times daily. 08/30/15   RaMendel CorningMD  benzonatate (TESSALON)  100 MG capsule Take 1 capsule (100 mg total) by mouth 3 (three) times daily as needed for cough. 08/30/15   Rai, Vernelle Emerald, MD  carvedilol (COREG) 6.25 MG tablet Take 1 tablet (6.25 mg total) by mouth 2 (two) times daily with a meal. 08/30/15   Rai, Ripudeep K, MD  CRESTOR 20 MG tablet Take 20 mg by mouth at bedtime.  08/26/14   [provider]  dofetilide (TIKOSYN) 125 MCG capsule Take 125 mcg by mouth 2 (two) times daily. 05/20/18   [provider]  doxycycline (VIBRA-TABS) 100 MG tablet Take 100 mg by mouth 2 (two) times  daily. 05/08/18   [provider]  ELIQUIS 5 MG TABS tablet Take 5 mg by mouth 2 (two) times daily. 08/25/15   [provider]  furosemide (LASIX) 40 MG tablet Take 20 mg by mouth daily. 03/27/18   [provider]  gabapentin (NEURONTIN) 100 MG capsule TAKE ONE CAPSULE EVERY 8 HOURS 04/19/18   [provider]  guaiFENesin (MUCINEX) 600 MG 12 hr tablet Take 2 tablets (1,200 mg total) by mouth 2 (two) times daily. 08/30/15   Rai, Vernelle Emerald, MD  lisinopril (PRINIVIL,ZESTRIL) 10 MG tablet  06/14/18   [provider]  metoprolol tartrate (LOPRESSOR) 50 MG tablet TAKE 1/2 TABLET EVERY 12 HOURS DAILY. 04/02/18   [provider]  Multiple Vitamin (MULTIVITAMIN) capsule Take 1 capsule by mouth daily.    [provider]  niacin (NIASPAN) 1000 MG CR tablet Take 1,000 mg by mouth every morning.     [provider]  Omega 3 1000 MG CAPS Take 1,000 mg by mouth daily.     [provider]  polyethylene glycol (MIRALAX / GLYCOLAX) packet Take 17 g by mouth daily as needed for moderate constipation. 08/30/15   Rai, Ripudeep K, MD  potassium chloride SA (K-DUR,KLOR-CON) 20 MEQ tablet Take 20 mEq by mouth daily. 05/20/18   [provider]  Sodium Chloride-Sodium Bicarb (NETI POT SINUS [place]) 2300-700 MG KIT Place 1 application into the nose daily as needed (for congestion).    [provider]  valACYclovir (VALTREX) 1000 MG tablet Take 1,000 mg by mouth every 8 (eight) hours as needed. 03/13/18   [provider]  Vitamin D, Cholecalciferol, 1000 UNITS TABS Take 1,000 Units by mouth daily.     [provider]    Family History Family History  Problem Relation Age of Onset  . Heart disease Mother        died at age 32 of heart attack    Social History Social History   Tobacco Use  . Smoking status: Never Smoker  . Smokeless tobacco: Never Used  Substance Use Topics  . Alcohol use: No  . Drug use:  No     Allergies   Contrast media [iodinated diagnostic agents], Metrizamide, Amiodarone, Ciprofloxacin, Other, and Tramadol   Review of Systems Review of Systems  Constitutional: Negative for fever.  Cardiovascular: Negative for chest pain.  Gastrointestinal: Negative for diarrhea and vomiting.  Musculoskeletal: Positive for arthralgias.  Neurological: Negative for weakness and numbness.  All other systems reviewed and are negative.    Physical Exam Updated Vital Signs BP 101/65   Pulse 83   Temp 98.4 F (36.9 C) (Oral)   Resp 16   SpO2 97%   Physical Exam Vitals signs and nursing note reviewed.  Constitutional:      Appearance: He is well-developed.  HENT:     Head: Normocephalic and  atraumatic.     Right Ear: External ear normal.     Left Ear: External ear normal.     Nose: Nose normal.  Eyes:     General:        Right eye: No discharge.        Left eye: No discharge.  Neck:     Musculoskeletal: Neck supple.  Cardiovascular:     Rate and Rhythm: Normal rate. Rhythm irregular.     Heart sounds: Normal heart sounds.  Pulmonary:     Effort: Pulmonary effort is normal.  Abdominal:     General: There is no distension.     Palpations: Abdomen is soft.     Tenderness: There is no abdominal tenderness.  Musculoskeletal:     Right hip: He exhibits decreased range of motion (painful) and tenderness.     Right knee: No tenderness found.     Right upper leg: He exhibits no tenderness.       Legs:  Skin:    General: Skin is warm and dry.  Neurological:     Mental Status: He is alert.  Psychiatric:        Mood and Affect: Mood is not anxious.      ED Treatments / Results  Labs (all labs ordered are listed, but only abnormal results are displayed) Labs Reviewed - No data to display  EKG EKG Interpretation  Date/Time:  Friday November 08 2018 08:39:05 EDT Ventricular Rate:  73 PR Interval:    QRS Duration: 132 QT Interval:  411 QTC Calculation: 453 R  Axis:   -66 Text Interpretation:  Sinus rhythm Left bundle branch block Confirmed by Sherwood Gambler [id number] on 11/08/2018 8:45:30 AM   Radiology Ct Pelvis Wo Contrast  Result Date: 11/08/2018 CLINICAL DATA:  Severe right hip pain after multiple falls. EXAM: CT PELVIS WITHOUT CONTRAST TECHNIQUE: Multidetector CT imaging of the pelvis was performed following the standard protocol without intravenous contrast. COMPARISON:  Right hip x-rays from same day. FINDINGS: Urinary Tract:  No abnormality visualized. Bowel:  Unremarkable visualized pelvic bowel loops. Vascular/Lymphatic: Aortoiliac atherosclerotic vascular disease. No pathologically enlarged lymph nodes. Reproductive:  Mild prostatomegaly. Other:  Nonspecific presacral stranding. Musculoskeletal: Ill-defined hyperdensity within and between the right gluteus maximus and medius muscles extending inferiorly into the posterior upper thigh with mass effect and medial displacement of the sciatic nerve, consistent with hematoma. The hematoma is difficult to measure, but spans a craniocaudal length of approximately 20.6 cm. Superficial soft tissue edema in the lateral right hip. No acute fracture or dislocation. Mild bilateral hip joint space narrowing. Severe degenerative disc disease at L5-S1. IMPRESSION: 1. Ill-defined large right gluteal hematoma extending inferiorly into the posterior upper thigh with mass effect and medial displacement the sciatic nerve. 2.  No acute osseous abnormality. 3.  Aortic atherosclerosis (ICD10-I70.0). Electronically Signed   By: Titus Dubin M.D.   On: 11/08/2018 10:48   Dg Hip Unilat W Or Wo Pelvis 2-3 Views Right  Result Date: 11/08/2018 CLINICAL DATA:  Right lateral hip pain after a recent fall. EXAM: DG HIP (WITH OR WITHOUT PELVIS) 2-3V RIGHT COMPARISON:  11/05/2018 FINDINGS: No acute fracture or hip dislocation is identified. Hip joint space widths are preserved with at most mild acetabular spurring. Atherosclerotic  vascular calcifications are noted. IMPRESSION: No acute osseous abnormality identified. Electronically Signed   By: Logan Bores M.D.   On: 11/08/2018 09:11    Procedures Procedures (including critical care time)  Medications Ordered in ED Medications  acetaminophen (TYLENOL) tablet 650 mg (650 mg Oral Given 11/08/18 0854)     Initial Impression / Assessment and Plan / ED Course  I have reviewed the triage vital signs and the nursing notes.  Pertinent labs & imaging results that were available during my care of the patient were reviewed by me and considered in my medical decision making (see chart for details).        CT does not show any obvious fracture.  He does have an impressively sized hematoma.  While there appears to be some displacement of the sciatic nerve, he has intact neurologic function, strength and sensation in his right lower extremity.  He is able to ambulate with a walker.  He prefers to use Tylenol at home.  Encouraged to return if he can't walk or has neuro symptoms.  Final Clinical Impressions(s) / ED Diagnoses   Final diagnoses:  Contusion of right hip, initial encounter    ED Discharge Orders    None       Sherwood Gambler, MD 11/08/18 1500

## 2018-11-08 NOTE — ED Triage Notes (Signed)
Pt fell Monday afternoon and had a syncopal episode Monday night and fell. Pt having incr R.Hip pain that radiates to foot. Pain 10/10 w/ movement. Pt having near syncopal episode w/ movement per wife.

## 2018-11-08 NOTE — Consult Note (Signed)
Ortho Trauma Note  Reviewed imaging and discussed case with Dr. Darl Householder. 85 yo with right gluteal hematoma. Hgb dropped from 12.4 to 7.9 over last three days. According to Dr. Darl Householder, no neurologic compromise. Recommend observation and no surgical intervention. Would likely admit to hospitalist for anemia and failed mobilization. Will perform formal consult tomorrow AM.  Shona Needles, MD [facility] [phone] (phone) [phone] (office) [email or web address]

## 2018-11-08 NOTE — ED Notes (Signed)
Attempted report x1. 

## 2018-11-08 NOTE — ED Notes (Signed)
ED TO INPATIENT HANDOFF REPORT  ED Nurse Name and Phone #: Leatha Gildingmallory [phone]  S Name/Age/Gender Edgar Thomas 85 y.o. male Room/Bed: 025C/025C  Code Status   Code Status: Prior  Home/SNF/Other Home Patient oriented to: self, place, time and situation Is this baseline? Yes   Triage Complete: Triage complete  Chief Complaint Syncope   Triage Note Pt arrives following syncopal episode after being taken home from this facility earlier today. Pt's [facility] and came up to 110/68 after 1000 fluid. Per EMS, pt was "flacid" with "thousand yard stare" but alert and appropriate for EMS but stroke screen was negative. CBG 200.  Pt is oriented to person, place, and situation, disoriented to time    Allergies Allergies  Allergen Reactions  . Contrast Media [Iodinated Diagnostic Agents] Rash    [facility]  . Metrizamide Rash    [facility] ([place]  . Other Other (See Comments)    Patient preference: NO MEAT OR DAIRY!!  . Amiodarone Other (See Comments)    Makes the patient walk sideways and he falls  . Ciprofloxacin Other (See Comments)    Was told by MD to "not take"  . Tramadol Nausea And Vomiting    Level of Care/Admitting Diagnosis ED Disposition    ED Disposition Condition Comment   Admit  [facility] [[id number]]  Level of Care: Telemetry Medical [104]  Covid Evaluation: Screening Protocol (No Symptoms)  Diagnosis: Symptomatic anemia [[id number]]  Admitting Physician: Mirian MoFRANK, PETER [[id number]]  Attending Physician: Manson PasseyBROWN, CARINA Judie PetitM [[id number]]  PT Class (Do Not Modify): Observation [104]  PT Acc Code (Do Not Modify): Observation [10022]       B Medical/Surgery History Past Medical History:  Diagnosis Date  . A-fib ([facility])   . CAD (coronary artery disease)   . Constipation   . GERD (gastroesophageal reflux disease)   . Heart attack ([facility])   . Heartburn   .  Hyperlipemia   . Hypertension    Past Surgical History:  Procedure Laterality Date  . balloon angioplasty of LAD    . COLONOSCOPY    . CORONARY ARTERY BYPASS GRAFT N/A 12/03/2013   Procedure: CORONARY ARTERY BYPASS GRAFTING (CABG) x4 using left internal mammary artery and right greater saphenous vein. LIMA to LAD, sequential SVG to OM 1 & OM 2, SVG to PD;  Surgeon: Delight OvensEdward B Gerhardt, MD;  [facility];  Service: Open Heart Surgery;  Laterality: N/A;  . ENDOVEIN HARVEST OF GREATER SAPHENOUS VEIN Right 12/03/2013   Procedure: ENDOVEIN HARVEST OF GREATER SAPHENOUS VEIN;  Surgeon: Delight OvensEdward B Gerhardt, MD;  Location: [facility] OR;  Service: Open Heart Surgery;  Laterality: Right;  . INTRAOPERATIVE TRANSESOPHAGEAL ECHOCARDIOGRAM N/A 12/03/2013   Procedure: INTRAOPERATIVE TRANSESOPHAGEAL ECHOCARDIOGRAM;  Surgeon: Delight OvensEdward B Gerhardt, MD;  [facility];  Service: Open Heart Surgery;  Laterality: N/A;     A IV Location/Drains/Wounds Patient Lines/Drains/Airways Status   Active Line/Drains/Airways    Name:   Placement date:   Placement time:   Site:   Days:   Peripheral IV 11/08/18 Right Forearm   11/08/18    1640    Forearm   less than 1          Intake/Output Last 24 hours No intake or output data in the 24 hours ending 11/08/18 1948  Labs/Imaging Results for orders placed or performed during the hospital encounter of 11/08/18 (from the past 48 hour(s))  CBC with Differential/Platelet     Status: Abnormal   Collection Time: 11/08/18  4:22 PM  Result Value Ref Range   WBC 10.4 4.0 - 10.5 K/uL   RBC 2.38 (L) 4.22 - 5.81 MIL/uL   Hemoglobin 7.9 (L) 13.0 - 17.0 g/dL   HCT 24.4 (L) 39.0 - 52.0 %   MCV 102.5 (H) 80.0 - 100.0 fL   MCH 33.2 26.0 - 34.0 pg   MCHC 32.4 30.0 - 36.0 g/dL   RDW 13.0 11.5 - 15.5 %   Platelets 142 (L) 150 - 400 K/uL    Comment: REPEATED TO VERIFY SPECIMEN CHECKED FOR CLOTS CONSISTENT WITH PREVIOUS RESULT    nRBC 0.0 0.0 - 0.2 %   Neutrophils Relative % 77 %   Neutro  Abs 7.9 (H) 1.7 - 7.7 K/uL   Lymphocytes Relative 8 %   Lymphs Abs 0.8 0.7 - 4.0 K/uL   Monocytes Relative 14 %   Monocytes Absolute 1.5 (H) 0.1 - 1.0 K/uL   Eosinophils Relative 0 %   Eosinophils Absolute 0.0 0.0 - 0.5 K/uL   Basophils Relative 0 %   Basophils Absolute 0.0 0.0 - 0.1 K/uL   Immature Granulocytes 1 %   Abs Immature Granulocytes 0.06 0.00 - 0.07 K/uL    Comment: Performed at [facility], [address], [place], [place] [address]  Comprehensive metabolic panel     Status: Abnormal   Collection Time: 11/08/18  4:22 PM  Result Value Ref Range   Sodium 137 135 - 145 mmol/L   Potassium 4.1 3.5 - 5.1 mmol/L   Chloride 107 98 - 111 mmol/L   CO2 24 22 - 32 mmol/L   Glucose, Bld 138 (H) 70 - 99 mg/dL   BUN 22 8 - 23 mg/dL   Creatinine, Ser 0.91 0.61 - 1.24 mg/dL   Calcium 8.1 (L) 8.9 - 10.3 mg/dL   Total Protein 5.4 (L) 6.5 - 8.1 g/dL   Albumin 2.6 (L) 3.5 - 5.0 g/dL   AST 20 15 - 41 U/L   ALT 13 0 - 44 U/L   Alkaline Phosphatase 44 38 - 126 U/L   Total Bilirubin 1.3 (H) 0.3 - 1.2 mg/dL   GFR calc non Af Amer >60 >60 mL/min   GFR calc Af Amer >60 >60 mL/min   Anion gap 6 5 - 15    Comment: Performed at [facility] [address] [place], [place] [address]  Type and screen     Status: None   Collection Time: 11/08/18  4:22 PM  Result Value Ref Range   ABO/RH(D) O POS    Antibody Screen NEG    Sample Expiration      11/11/2018,2359 Performed at [facility], [place] [address] [place], [place] [address]   CK     Status: None   Collection Time: 11/08/18  4:22 PM  Result Value Ref Range   Total CK 110 49 - 397 U/L    Comment: Performed at [facility], [place] [address] [place],  [address]  I-stat troponin, ED     Status: None   Collection Time: 11/08/18  4:27 PM  Result Value Ref Range   Troponin i, poc 0.03 0.00 - 0.08 ng/mL   Comment 3            Comment: Due to the release kinetics of cTnI, a negative result within the first  hours of the onset of symptoms does  not rule out myocardial infarction with certainty. If myocardial infarction is still suspected, repeat the test at appropriate intervals.    Ct Pelvis Wo Contrast  Result Date: 11/08/2018 CLINICAL DATA:  Severe right hip pain after multiple falls. EXAM: CT PELVIS WITHOUT CONTRAST TECHNIQUE: Multidetector CT imaging of the pelvis was performed following the standard protocol without intravenous contrast. COMPARISON:  Right hip x-rays from same day. FINDINGS: Urinary Tract:  No abnormality visualized. Bowel:  Unremarkable visualized pelvic bowel loops. Vascular/Lymphatic: Aortoiliac atherosclerotic vascular disease. No pathologically enlarged lymph nodes. Reproductive:  Mild prostatomegaly. Other:  Nonspecific presacral stranding. Musculoskeletal: Ill-defined hyperdensity within and between the right gluteus maximus and medius muscles extending inferiorly into the posterior upper thigh with mass effect and medial displacement of the sciatic nerve, consistent with hematoma. The hematoma is difficult to measure, but spans a craniocaudal length of approximately 20.6 cm. Superficial soft tissue edema in the lateral right hip. No acute fracture or dislocation. Mild bilateral hip joint space narrowing. Severe degenerative disc disease at L5-S1. IMPRESSION: 1. Ill-defined large right gluteal hematoma extending inferiorly into the posterior upper thigh with mass effect and medial displacement the sciatic nerve. 2.  No acute osseous abnormality. 3.  Aortic atherosclerosis (ICD10-I70.0). Electronically Signed   By: Obie DredgeWilliam T Derry M.D.   On: 11/08/2018 10:48   Dg Chest Port 1 View  Result Date: 11/08/2018 CLINICAL DATA:  RIGHT femur pain, fell on Monday EXAM: PORTABLE CHEST 1 VIEW COMPARISON:  Portable exam 1630 hours compared to 11/05/2018 FINDINGS: Normal heart size post CABG. Atherosclerotic calcification aorta. Mediastinal contours and pulmonary vascularity normal. Lungs  clear. No pulmonary infiltrate, pleural effusion or pneumothorax. IMPRESSION: No acute abnormalities. Electronically Signed   By: Ulyses SouthwardMark  Boles M.D.   On: 11/08/2018 16:46   Dg Hip Unilat W Or Wo Pelvis 2-3 Views Right  Result Date: 11/08/2018 CLINICAL DATA:  Right lateral hip pain after a recent fall. EXAM: DG HIP (WITH OR WITHOUT PELVIS) 2-3V RIGHT COMPARISON:  11/05/2018 FINDINGS: No acute fracture or hip dislocation is identified. Hip joint space widths are preserved with at most mild acetabular spurring. Atherosclerotic vascular calcifications are noted. IMPRESSION: No acute osseous abnormality identified. Electronically Signed   By: Sebastian AcheAllen  Grady M.D.   On: 11/08/2018 09:11   Dg Femur Portable 1 View Right  Result Date: 11/08/2018 CLINICAL DATA:  Fall EXAM: RIGHT FEMUR PORTABLE 1 VIEW COMPARISON:  Portable AP exam at 1638 hours without priors for comparison FINDINGS: Osseous mineralization normal. Knee and hip joint spaces preserved. No acute fracture, dislocation, or bone destruction. Scattered atherosclerotic calcifications. IMPRESSION: No acute osseous abnormalities. Electronically Signed   By: Ulyses SouthwardMark  Boles M.D.   On: 11/08/2018 16:47    Pending Labs Unresulted Labs (From admission, onward)    Start     Ordered   11/08/18 1614  Novel Coronavirus,NAA,(SEND-OUT TO REF LAB - TAT 24-48 hrs); Hosp Order  (Asymptomatic Patients Labs)  Once,   STAT    Question:  Rule Out  Answer:  Yes   11/08/18 1613          Vitals/Pain Today's Vitals   11/08/18 1552 11/08/18 1554 11/08/18 1839 11/08/18 1842  BP:  (!) 116/59  133/65  Pulse:  70 71 65  Resp:  16 16 (!) 24  Temp:  98 F (36.7 C)    SpO2:  93% 96% 97%  Weight: 72.6 kg     Height: 5\' 10"  (1.778 m)       Isolation Precautions No active isolations  Medications Medications  sodium chloride 0.9 % bolus 1,000 mL (0 mLs Intravenous Stopped 11/08/18 1842)    Mobility walks Moderate fall risk   Focused  Assessments    R Recommendations: See Admitting Provider Note  Report given to:   Additional Notes:

## 2018-11-08 NOTE — ED Provider Notes (Signed)
[facility] EMERGENCY DEPARTMENT Provider Note   CSN: [id number] Arrival date & time: 11/08/18  1544     History   Chief Complaint No chief complaint on file.   HPI Edgar Thomas is a 85 y.o. male hx of afib on eliquis, GERD, HL, HTN, here presenting with syncope, will right hip pain.  Patient was seen earlier in the ER for right hip pain.  Patient told me that he fell 5 days ago and had progressive right hip pain since then.  He came to the ER earlier today and had x-rays of his hip that was unremarkable and a CT that showed a large gluteal hematoma compressing on the right sciatic nerve.  Patient had no saddle anesthesia and he went home and try to get out of the car and felt lightheaded and dizzy and apparently passed out and urinated on himself.  Patient had no seizure-like activity.  Patient apparently was hypotensive in the 80s per EMS and was given some IV fluid and blood pressure and up to 110.  Patient was with family at that time and had no head injury.     The history is provided by the patient.    Past Medical History:  Diagnosis Date   A-fib [facility])    CAD (coronary artery disease)    Constipation    GERD (gastroesophageal reflux disease)    Heart attack ([place])    Heartburn    Hyperlipemia    Hypertension     Patient Active Problem List   Diagnosis Date Noted   Pseudophakia 03/06/2018   S/P laser cataract surgery 03/06/2018   AMD (age-related macular degeneration), bilateral 02/26/2018   Combined form of senile cataract 02/17/2018   Herpes zoster with complication 79/39/0300   UTI (urinary tract infection) 02/18/2016   Acute on chronic combined systolic and diastolic congestive heart failure ([place]) 02/17/2016   CHF (congestive heart failure) ([place]) 02/17/2016   Gait disturbance 02/17/2016   Near syncope 09/03/2015   Hyponatremia 09/03/2015   Bronchitis 08/29/2015   Encounter for therapeutic drug monitoring 12/12/2013    Anticoagulated on warfarin 12/10/2013   Anticoagulated 12/10/2013   Cardiomyopathy, ischemic- EF 40-45% 12/09/2013   Atrial fibrillation with RVR ([place]) 12/05/2013   CAD- last PCI [facility] yrs ago at [facility] 12/05/2013   GERD (gastroesophageal reflux disease)    Hyperlipemia    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 LIMA to LAD, sequential SVG to OM 1 & OM 2, SVG to PD;  Surgeon: Grace Isaac, MD;  Location: [place];  Service: Open Heart Surgery;  Laterality: N/A;   ENDOVEIN HARVEST OF GREATER SAPHENOUS VEIN Right 12/03/2013   Procedure: ENDOVEIN HARVEST OF GREATER SAPHENOUS VEIN;  Surgeon: Grace Isaac, MD;  Location: [place];  Service: Open Heart Surgery;  Laterality: Right;   INTRAOPERATIVE TRANSESOPHAGEAL ECHOCARDIOGRAM N/A 12/03/2013   Procedure: INTRAOPERATIVE TRANSESOPHAGEAL ECHOCARDIOGRAM;  Surgeon: Grace Isaac, MD;  Location: [place];  Service: Open Heart Surgery;  Laterality: N/A;        Home Medications    Prior to Admission medications   Medication Sig Start Date End Date Taking? Authorizing Provider  aspirin EC 81 MG tablet  Take 81 mg by mouth daily.   Yes [provider]  Cholecalciferol (VITAMIN D-3) 25 MCG (1000 UT) CAPS Take 1,000 Units by mouth daily after breakfast.   Yes [provider]  CRESTOR 20 MG tablet Take 20 mg by mouth daily.  08/26/14  Yes [provider]  dofetilide (TIKOSYN) 125 MCG capsule Take 125 mcg by mouth 2 (two) times daily. 05/20/18  Yes [provider]  ELIQUIS 5 MG TABS tablet Take 5 mg by mouth 2 (two) times daily. 08/25/15  Yes [provider]  furosemide (LASIX) 40 MG tablet Take 20 mg by mouth daily.  03/27/18  Yes [provider]  gabapentin (NEURONTIN) 100 MG capsule Take 100 mg by mouth 2 (two) times a day. Morning and bedtime 04/19/18  Yes [provider]  lisinopril (ZESTRIL) 5 MG tablet Take 5 mg by mouth daily. 10/10/18  Yes [provider]  metoprolol tartrate (LOPRESSOR) 50 MG tablet Take 25 mg by mouth 2 (two) times daily.  04/02/18  Yes [provider]  Multiple Vitamins-Minerals (ONE-A-DAY MENS 50+ ADVANTAGE) TABS Take 1 tablet by mouth daily with breakfast.    Yes [provider]  multivitamin-lutein (OCUVITE-LUTEIN) CAPS capsule Take 1 capsule by mouth daily.   Yes [provider]  nitroGLYCERIN (NITROSTAT) 0.4 MG SL tablet Place 0.4 mg under the tongue every 5 (five) minutes as needed for chest pain.   Yes [provider]  NON FORMULARY Place under the tongue See admin instructions. CBD oil: Place 2-3 droppersful sublingually 2 times a day   Yes [provider]  Omega 3 1000 MG CAPS Take 1,000 mg by mouth daily.    Yes [provider]  potassium chloride SA (K-DUR,KLOR-CON) 20 MEQ tablet Take 20 mEq by mouth daily. 05/20/18  Yes [provider]  Sodium Chloride-Sodium Bicarb (NETI POT SINUS [place]) 2300-700 MG KIT Place into the nose daily as needed (for congestion).    Yes [provider]  amiodarone (PACERONE) 200 MG tablet Take 432m (2 tablets) twice a day for 1 week, then continue 2086m(1 tab) twice a day thereafter Patient not taking: Reported on 11/08/2018 08/30/15   Rai, RiVernelle EmeraldMD  amoxicillin-clavulanate (AUGMENTIN) 875-125 MG tablet Take 1 tablet by mouth 2 (two) times daily. Patient not taking: Reported on 11/08/2018 08/30/15   Rai, RiVernelle EmeraldMD  benzonatate (TESSALON) 100 MG capsule Take 1 capsule (100 mg total) by mouth 3 (three) times daily as needed for cough. Patient not taking: Reported on 11/08/2018 08/30/15   Rai, RiVernelle EmeraldMD  carvedilol (COREG) 6.25 MG tablet Take 1  tablet (6.25 mg total) by mouth 2 (two) times daily with a meal. Patient not taking: Reported on 11/08/2018 08/30/15   Rai, RiVernelle EmeraldMD  guaiFENesin (MUCINEX) 600 MG 12 hr tablet Take 2 tablets (1,200 mg total) by mouth 2 (two) times daily. Patient not taking: Reported on 11/08/2018 08/30/15   Rai, RiVernelle EmeraldMD  polyethylene glycol (MIRALAX / GLYCOLAX) packet Take 17 g by mouth daily as needed for moderate constipation. Patient not taking: Reported on 11/08/2018 08/30/15   RaMendel CorningMD    Family History Family History  Problem Relation Age of Onset   Heart disease Mother        died at age 9694f heart attack    Social History Social History   Tobacco Use   Smoking status: Never Smoker   Smokeless tobacco: Never Used  Substance Use Topics  Alcohol use: No   Drug use: No     Allergies   Contrast media [iodinated diagnostic agents], Metrizamide, Other, Amiodarone, Ciprofloxacin, and Tramadol   Review of Systems Review of Systems  Musculoskeletal:       R hip pain   Neurological: Positive for syncope.  All other systems reviewed and are negative.    Physical Exam Updated Vital Signs BP 133/65 (BP Location: Right Arm)    Pulse 65    Temp 98 F (36.7 C)    Resp (!) 24    Ht _0  (1.778 m)    Wt 72.6 kg    SpO2 97%    BMI 22.96 kg/m   Physical Exam Vitals signs and nursing note reviewed.  Constitutional:      Comments: Slightly pale   HENT:     Head: Normocephalic and atraumatic.     Nose: Nose normal.     Mouth/Throat:     Mouth: Mucous membranes are moist.  Eyes:     Extraocular Movements: Extraocular movements intact.     Pupils: Pupils are equal, round, and reactive to light.  Neck:     Musculoskeletal: Normal range of motion.  Cardiovascular:     Rate and Rhythm: Normal rate.     Pulses: Normal pulses.  Pulmonary:     Effort: Pulmonary effort is normal.  Abdominal:     General: Abdomen is flat.     Palpations: Abdomen is soft.    Musculoskeletal:     Comments: R buttock ecchymosis that went down to R thigh, also ecchymosis R distal leg. Able to range the hip and knees, no obvious bony tenderness. No saddle anesthesia, nl strength and sensation and reflexes bilateral lower extremities   Skin:    General: Skin is warm.     Capillary Refill: Capillary refill takes less than 2 seconds.  Neurological:     General: No focal deficit present.     Mental Status: He is alert.  Psychiatric:        Mood and Affect: Mood normal.      ED Treatments / Results  Labs (all labs ordered are listed, but only abnormal results are displayed) Labs Reviewed  CBC WITH DIFFERENTIAL/PLATELET - Abnormal; Notable for the following components:      Result Value   RBC 2.38 (*)    Hemoglobin 7.9 (*)    HCT 24.4 (*)    MCV 102.5 (*)    Platelets 142 (*)    Neutro Abs 7.9 (*)    Monocytes Absolute 1.5 (*)    All other components within normal limits  COMPREHENSIVE METABOLIC PANEL - Abnormal; Notable for the following components:   Glucose, Bld 138 (*)    Calcium 8.1 (*)    Total Protein 5.4 (*)    Albumin 2.6 (*)    Total Bilirubin 1.3 (*)    All other components within normal limits  NOVEL CORONAVIRUS, NAA (HOSPITAL ORDER, SEND-OUT TO REF LAB)  CK  I-STAT TROPONIN, ED  TYPE AND SCREEN    EKG EKG Interpretation  Date/Time:  Friday November 08 2018 15:57:14 EDT Ventricular Rate:  68 PR Interval:    QRS Duration: 135 QT Interval:  405 QTC Calculation: 431 R Axis:   -57 Text Interpretation:  Sinus rhythm Left bundle branch block No significant change since last tracing Confirmed by Wandra Arthurs [phone]) on 11/08/2018 4:04:32 PM   Radiology Ct Pelvis Wo Contrast  Result Date: 11/08/2018 CLINICAL DATA:  Severe right  hip pain after multiple falls. EXAM: CT PELVIS WITHOUT CONTRAST TECHNIQUE: Multidetector CT imaging of the pelvis was performed following the standard protocol without intravenous contrast. COMPARISON:  Right hip  x-rays from same day. FINDINGS: Urinary Tract:  No abnormality visualized. Bowel:  Unremarkable visualized pelvic bowel loops. Vascular/Lymphatic: Aortoiliac atherosclerotic vascular disease. No pathologically enlarged lymph nodes. Reproductive:  Mild prostatomegaly. Other:  Nonspecific presacral stranding. Musculoskeletal: Ill-defined hyperdensity within and between the right gluteus maximus and medius muscles extending inferiorly into the posterior upper thigh with mass effect and medial displacement of the sciatic nerve, consistent with hematoma. The hematoma is difficult to measure, but spans a craniocaudal length of approximately 20.6 cm. Superficial soft tissue edema in the lateral right hip. No acute fracture or dislocation. Mild bilateral hip joint space narrowing. Severe degenerative disc disease at L5-S1. IMPRESSION: 1. Ill-defined large right gluteal hematoma extending inferiorly into the posterior upper thigh with mass effect and medial displacement the sciatic nerve. 2.  No acute osseous abnormality. 3.  Aortic atherosclerosis (ICD10-I70.0). Electronically Signed   By: Titus Dubin M.D.   On: 11/08/2018 10:48   Dg Chest Port 1 View  Result Date: 11/08/2018 CLINICAL DATA:  RIGHT femur pain, fell on Monday EXAM: PORTABLE CHEST 1 VIEW COMPARISON:  Portable exam 1630 hours compared to 11/05/2018 FINDINGS: Normal heart size post CABG. Atherosclerotic calcification aorta. Mediastinal contours and pulmonary vascularity normal. Lungs clear. No pulmonary infiltrate, pleural effusion or pneumothorax. IMPRESSION: No acute abnormalities. Electronically Signed   By: Lavonia Dana M.D.   On: 11/08/2018 16:46   Dg Hip Unilat W Or Wo Pelvis 2-3 Views Right  Result Date: 11/08/2018 CLINICAL DATA:  Right lateral hip pain after a recent fall. EXAM: DG HIP (WITH OR WITHOUT PELVIS) 2-3V RIGHT COMPARISON:  11/05/2018 FINDINGS: No acute fracture or hip dislocation is identified. Hip joint space widths are preserved  with at most mild acetabular spurring. Atherosclerotic vascular calcifications are noted. IMPRESSION: No acute osseous abnormality identified. Electronically Signed   By: Logan Bores M.D.   On: 11/08/2018 09:11   Dg Femur Portable 1 View Right  Result Date: 11/08/2018 CLINICAL DATA:  Fall EXAM: RIGHT FEMUR PORTABLE 1 VIEW COMPARISON:  Portable AP exam at 1638 hours without priors for comparison FINDINGS: Osseous mineralization normal. Knee and hip joint spaces preserved. No acute fracture, dislocation, or bone destruction. Scattered atherosclerotic calcifications. IMPRESSION: No acute osseous abnormalities. Electronically Signed   By: Lavonia Dana M.D.   On: 11/08/2018 16:47    Procedures Procedures (including critical care time)  Medications Ordered in ED Medications  sodium chloride 0.9 % bolus 1,000 mL (0 mLs Intravenous Stopped 11/08/18 1842)     Initial Impression / Assessment and Plan / ED Course  I have reviewed the triage vital signs and the nursing notes.  Pertinent labs & imaging results that were available during my care of the patient were reviewed by me and considered in my medical decision making (see chart for details).       Edgar Thomas is a 85 y.o. male here with R buttock hematoma, syncope.  I suspect that he syncopized secondary to either pain from the hematoma or anemia.  No blood work was checked earlier today so we will check CBC chemistry.  Will get right femur x-ray to rule out fracture.  6 pm Hg 7.6, dropped from 12.6 last week. Patient's BP stable. I talked to Dr. Doreatha Martin from ortho. He recommend holding eliquis and trending CBC. He will see patient in AM.  Since patient has no active bleeding currently and is hemodynamically stable, will hold off on reversing the eliquis. Family practice to admit.    Final Clinical Impressions(s) / ED Diagnoses   Final diagnoses:  None    ED Discharge Orders    None       Drenda Freeze, MD 11/08/18 1940

## 2018-11-08 NOTE — ED Notes (Signed)
Pt able to ambulate in hallway with stand by tech assistance and walker.

## 2018-11-09 ENCOUNTER — Observation Stay (HOSPITAL_COMMUNITY): Payer: Medicare Other

## 2018-11-09 DIAGNOSIS — D649 Anemia, unspecified: Secondary | ICD-10-CM | POA: Diagnosis not present

## 2018-11-09 DIAGNOSIS — R55 Syncope and collapse: Secondary | ICD-10-CM | POA: Diagnosis not present

## 2018-11-09 DIAGNOSIS — S7001XA Contusion of right hip, initial encounter: Secondary | ICD-10-CM

## 2018-11-09 DIAGNOSIS — I5022 Chronic systolic (congestive) heart failure: Secondary | ICD-10-CM | POA: Diagnosis not present

## 2018-11-09 DIAGNOSIS — I480 Paroxysmal atrial fibrillation: Secondary | ICD-10-CM

## 2018-11-09 LAB — CBC
HCT: 23.6 % — ABNORMAL LOW (ref 39.0–52.0)
HCT: 25.1 % — ABNORMAL LOW (ref 39.0–52.0)
HCT: 24 % — ABNORMAL LOW (ref 39.0–52.0)
Hemoglobin: 8 g/dL — ABNORMAL LOW (ref 13.0–17.0)
Hemoglobin: 8.5 g/dL — ABNORMAL LOW (ref 13.0–17.0)
Hemoglobin: 8.1 g/dL — ABNORMAL LOW (ref 13.0–17.0)
MCH: 32.8 pg (ref 26.0–34.0)
MCH: 33.1 pg (ref 26.0–34.0)
MCH: 32.7 pg (ref 26.0–34.0)
MCHC: 33.9 g/dL (ref 30.0–36.0)
MCHC: 33.9 g/dL (ref 30.0–36.0)
MCHC: 33.8 g/dL (ref 30.0–36.0)
MCV: 96.7 fL (ref 80.0–100.0)
MCV: 97.7 fL (ref 80.0–100.0)
MCV: 96.8 fL (ref 80.0–100.0)
Platelets: 124 10*3/uL — ABNORMAL LOW (ref 150–400)
Platelets: 141 10*3/uL — ABNORMAL LOW (ref 150–400)
Platelets: 145 10*3/uL — ABNORMAL LOW (ref 150–400)
RBC: 2.44 MIL/uL — ABNORMAL LOW (ref 4.22–5.81)
RBC: 2.57 MIL/uL — ABNORMAL LOW (ref 4.22–5.81)
RBC: 2.48 MIL/uL — ABNORMAL LOW (ref 4.22–5.81)
RDW: 14.2 % (ref 11.5–15.5)
RDW: 14.4 % (ref 11.5–15.5)
RDW: 14.4 % (ref 11.5–15.5)
WBC: 8.9 10*3/uL (ref 4.0–10.5)
WBC: 9 10*3/uL (ref 4.0–10.5)
WBC: 9.2 10*3/uL (ref 4.0–10.5)
nRBC: 0 % (ref 0.0–0.2)
nRBC: 0 % (ref 0.0–0.2)
nRBC: 0 % (ref 0.0–0.2)

## 2018-11-09 LAB — BASIC METABOLIC PANEL
Anion gap: 5 (ref 5–15)
BUN: 21 mg/dL (ref 8–23)
CO2: 23 mmol/L (ref 22–32)
Calcium: 8 mg/dL — ABNORMAL LOW (ref 8.9–10.3)
Chloride: 109 mmol/L (ref 98–111)
Creatinine, Ser: 0.74 mg/dL (ref 0.61–1.24)
GFR calc Af Amer: >60 mL/min (ref >60)
GFR calc non Af Amer: >60 mL/min (ref >60)
Glucose, Bld: 107 mg/dL — ABNORMAL HIGH (ref 70–99)
Potassium: 3.7 mmol/L (ref 3.5–5.1)
Sodium: 137 mmol/L (ref 135–145)

## 2018-11-09 LAB — NOVEL CORONAVIRUS, NAA (HOSP ORDER, SEND-OUT TO REF LAB; TAT 18-24 HRS): SARS-CoV-2, NAA: NOT DETECTED

## 2018-11-09 NOTE — Plan of Care (Signed)

## 2018-11-09 NOTE — H&P (Addendum)
[facility] Admission History and Physical Service Pager: [phone]  Patient name: Jaspal Pultz Medical record number: [id number] Date of birth: 12-17-1929 Age: 85 y.o. Gender: male  Primary Care Provider: [name], MD Consultants: none Code Status: FULL  Chief Complaint: syncope  Assessment and Plan: Nicholis Stepanek is a 85 y.o. male presenting with syncope and symptomatic anemia.  His past medical history is significant for CAD s/p CABG x4 (11/2013), A. fib on Eliquis, gait disturbance.  Syncope likely secondary to anemia from his right hip hematoma History remarkable for a significant right hip hematoma 5 days following a fall at home.  He reports weakness and lightheadedness prior to his syncopal episode in addition to urinary incontinence during the episode.  No fecal incontinence noted and he denied muscle twitching/jerking or eye rolling.  Vitals are entirely unremarkable on admission.  Physical exam is remarkable for right hip pain and hematoma of the right thigh.  Mild tenderness noted of the right thigh but no significant swelling or taut appearance.  Sensation intact to lower extremities good peripheral pulses.  Admission labs are remarkable for Hgb 7.9 from 12.4 (6/9).  CT pelvis showed an ill-defined right gluteal hematoma extending inferiorly and into the posterior upper thigh with mass-effect and displacement of the sciatic nerve.  The most likely cause of his syncope is likely orthostatic hypotension and symptomatic anemia.  Due to his history of A. fib, is difficult to exclude transient A. fib contributing to the syncope although he does appear to be well controlled in sinus rhythm on his current medication.  Ortho was consulted in the ED and recommended monitoring him inpatient and encouraging physical therapy, no surgical intervention at this time. -Admit to medical telemetry, attending Dr. Owens Shark -Ortho following, appreciate recommendations -Hold  apixaban -Hold aspirin -CBC every 6 hours -Telemetry -Transfuse 1 packed RBCs, transfusion threshold 8 due to significant CAD history -Monitor vitals every 4 hours -Hold antihypertensive medication -Orthostatic vitals -PT/OT  A. Fib Sinus rhythm on EKG in ED.  We will continue anti-arrhythmic medication and hold blood thinner. -Hold Eliquis -Continue dofetilide -monitor on tele   Heart failure with reduced ejection fraction, chronic Note from 11/24/2013 notes an echocardiogram showing 40-45% EF with mild mitral and mild aortic regurgitation.  Euvolemic on exam today.  Rhonchi present lobes bilaterally though lower extremity edema is only trace no evidence of significant JVD.  Other factors may be significantly contributing to his syncope at this time, heart failure exacerbation remains on the differential though it is low.  Will hold heart failure meds affecting his blood pressure. -Hold furosemide -Hold lisinopril -Hold metoprolol -Assess fluid status following 1 unit packed RBCs  CAD s/p CABG x4 (2015) No complaints of shortness of breath, chest pain, palpitations at this time.  We will continue home meds of Crestor and PRN nitroglycerin while holding aspirin. -Continue Crestor 20 mg -Nitroglycerin SL. -hold ASA  Chronic leg pain -Continue gabapentin 100 mg twice daily  FEN/GI: Heart healthy Prophylaxis: SCDs  Disposition: admit to med-tele, Discharge pending PT/OT evaluation  History of Present Illness:  Cinch Ormond is a 85 y.o. male presenting with syncope and symptomatic anemia.  His past medical history is significant for CAD s/p CABG x4 (11/2013), A. fib on Eliquis, gait disturbance.  [name] reports that he was in his normal state of health in 6/8 when he was moving around his kitchen trying to find the cholesterol content of his crackers.  The crackers had 0 cholesterol.  In  the process of moving around his kitchen, he noted that his feet got tangled up and he  ended up falling to the wood floor on his right hip.  Since that time, he is noted significant right hip pain with bruising on his right thigh.  On Tuesday, 6/9, he experienced an episode of weakness in the shower where he needed to slide along the shower wall to safely move to the floor of the shower.  He was assessed in the ED, found to have a hemoglobin of 12.4, able to ambulate without difficulty and was not found to have any concerning source for his syncope.  He was discharged home with the diagnosis of probably new syncope secondary to vasovagal response or dehydration.  On Friday, 6/12, he experienced a second episode of syncope.  He was moving from his car in the driveway and walking into his home in the company of his wife and son when he experienced an episode of lightheadedness and weakness.  He does not remember passing out but he does remember waking up in the company of his family.  He reports that he did not injure himself during this episode or hit his head.  Due to the second episode of syncope and increasing pain with ambulation, his family called 911 to have him assessed.  In the ED, he is found to have a hemoglobin of 7.9 and a large hematoma around his right hip.  Orthopedics was notified in the ED and recommended observation overnight with frequent hemoglobin checks but no need for reversal of anticoagulation or procedural drainage at this time.  Review Of Systems: Per HPI with the following additions:   Review of Systems  Constitutional: Positive for malaise/fatigue. Negative for chills and fever.  HENT: Negative for congestion, hearing loss and sore throat.   Eyes: Negative for blurred vision.  Respiratory: Negative for cough and shortness of breath.   Cardiovascular: Negative for chest pain and palpitations.  Gastrointestinal: Negative for abdominal pain, blood in stool, constipation, diarrhea, melena, nausea and vomiting.  Genitourinary: Negative for dysuria.   Musculoskeletal: Positive for joint pain (right hip). Negative for myalgias.  Skin: Negative for rash.  Neurological: Positive for weakness. Negative for tremors.  Endo/Heme/Allergies: Bruises/bleeds easily.    Patient Active Problem List   Diagnosis Date Noted  . Symptomatic anemia 11/08/2018  . Pseudophakia 03/06/2018  . S/P laser cataract surgery 03/06/2018  . AMD (age-related macular degeneration), bilateral 02/26/2018  . Combined form of senile cataract 02/17/2018  . Herpes zoster with complication 07/09/1733  . UTI (urinary tract infection) 02/18/2016  . Acute on chronic combined systolic and diastolic congestive heart failure ([place]) 02/17/2016  . CHF (congestive heart failure) ([place]) 02/17/2016  . Gait disturbance 02/17/2016  . Near syncope 09/03/2015  . Hyponatremia 09/03/2015  . Bronchitis 08/29/2015  . Encounter for therapeutic drug monitoring 12/12/2013  . Anticoagulated on warfarin 12/10/2013  . Anticoagulated 12/10/2013  . Cardiomyopathy, ischemic- EF 40-45% 12/09/2013  . Atrial fibrillation with RVR ([place]) 12/05/2013  . CAD- last PCI [facility] yrs ago at [facility] 12/05/2013  . GERD (gastroesophageal reflux disease)   . Hyperlipemia   . 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 Leg pain 06/13/2012    Past Medical History: Past Medical History:  Diagnosis Date  . A-fib ([place])   . CAD (coronary artery disease)   . Constipation   . GERD (gastroesophageal reflux disease)   . Heart attack ([place])   . Heartburn   . Hyperlipemia   .  Hypertension     Past Surgical History: Past Surgical History:  Procedure Laterality Date  . balloon angioplasty of LAD    . COLONOSCOPY    . CORONARY ARTERY BYPASS GRAFT N/A 12/03/2013   Procedure: CORONARY ARTERY BYPASS GRAFTING (CABG) x4 using left internal mammary artery and right greater saphenous vein. LIMA to LAD, sequential SVG to OM 1 & OM 2, SVG to PD;  Surgeon: Grace Isaac, MD;  Location: [place];  Service: Open Heart Surgery;  Laterality:  N/A;  . ENDOVEIN HARVEST OF GREATER SAPHENOUS VEIN Right 12/03/2013   Procedure: ENDOVEIN HARVEST OF GREATER SAPHENOUS VEIN;  Surgeon: Grace Isaac, MD;  Location: [place];  Service: Open Heart Surgery;  Laterality: Right;  . INTRAOPERATIVE TRANSESOPHAGEAL ECHOCARDIOGRAM N/A 12/03/2013   Procedure: INTRAOPERATIVE TRANSESOPHAGEAL ECHOCARDIOGRAM;  Surgeon: Grace Isaac, MD;  Location: [place];  Service: Open Heart Surgery;  Laterality: N/A;    Social History: Social History   Tobacco Use  . Smoking status: Never Smoker  . Smokeless tobacco: Never Used  Substance Use Topics  . Alcohol use: No  . Drug use: No   Additional social history: lives at home with wife  Please also refer to relevant sections of EMR.  Family History: Family History  Problem Relation Age of Onset  . Heart disease Mother        died at age 55 of heart attack    Allergies and Medications: Allergies  Allergen Reactions  . Contrast Media [Iodinated Diagnostic Agents] Rash    19 years ago at [facility]  . Metrizamide Rash    19 years ago at [facility] ([place])  . Other Other (See Comments)    Patient preference: NO MEAT OR DAIRY!!  . Amiodarone Other (See Comments)    Makes the patient walk sideways and he falls  . Ciprofloxacin Other (See Comments)    Was told by MD to "not take"  . Tramadol Nausea And Vomiting   No current facility-administered medications on file prior to encounter.    Current Outpatient Medications on File Prior to Encounter  Medication Sig Dispense Refill  . aspirin EC 81 MG tablet Take 81 mg by mouth every morning.     . Cholecalciferol (VITAMIN D-3) 25 MCG (1000 UT) CAPS Take 1,000 Units by mouth daily after breakfast.    . CRESTOR 20 MG tablet Take 20 mg by mouth every morning.   3  . dofetilide (TIKOSYN) 125 MCG capsule Take 125 mcg by mouth 2 (two) times daily.    Marland Kitchen ELIQUIS 5 MG TABS tablet Take 5 mg by mouth 2 (two) times daily.  0  .  furosemide (LASIX) 40 MG tablet Take 20 mg by mouth every morning.     . gabapentin (NEURONTIN) 100 MG capsule Take 100 mg by mouth 2 (two) times a day. Morning and bedtime    . lisinopril (ZESTRIL) 5 MG tablet Take 5 mg by mouth every morning.     . metoprolol tartrate (LOPRESSOR) 50 MG tablet Take 25 mg by mouth 2 (two) times daily.     . Multiple Vitamins-Minerals (ONE-A-DAY MENS 50+ ADVANTAGE) TABS Take 1 tablet by mouth daily with breakfast.     . multivitamin-lutein (OCUVITE-LUTEIN) CAPS capsule Take 1 capsule by mouth daily with breakfast.     . nitroGLYCERIN (NITROSTAT) 0.4 MG SL tablet Place 0.4 mg under the tongue every 5 (five) minutes as needed for chest pain.    . NON FORMULARY Place under  the tongue See admin instructions. CBD oil: Place 2-3 droppersful sublingually 2 times a day    . Omega 3 1000 MG CAPS Take 1,000 mg by mouth daily with breakfast.     . potassium chloride SA (K-DUR,KLOR-CON) 20 MEQ tablet Take 20 mEq by mouth every morning.     . Sodium Chloride-Sodium Bicarb (NETI POT SINUS WASH) 2300-700 MG KIT Place into the nose daily as needed (for congestion).     . benzonatate (TESSALON) 100 MG capsule Take 1 capsule (100 mg total) by mouth 3 (three) times daily as needed for cough. (Patient not taking: Reported on 11/08/2018) 45 capsule 0  . guaiFENesin (MUCINEX) 600 MG 12 hr tablet Take 2 tablets (1,200 mg total) by mouth 2 (two) times daily. (Patient not taking: Reported on 11/08/2018) 60 tablet 0  . polyethylene glycol (MIRALAX / GLYCOLAX) packet Take 17 g by mouth daily as needed for moderate constipation. (Patient not taking: Reported on 11/08/2018) 30 each 0    Objective: BP (!) 139/57   Pulse 70   Temp 98.6 F (37 C) (Oral)   Resp 20   Ht _0  (1.778 m)   Wt 74.7 kg   SpO2 98%   BMI 23.63 kg/m  Exam: Physical Exam Constitutional:      General: He is not in acute distress.    Appearance: Normal appearance. He is not ill-appearing.  HENT:     Head:  Normocephalic.     Nose: Nose normal. No congestion.     Mouth/Throat:     Mouth: Mucous membranes are moist.     Pharynx: Oropharynx is clear.  Eyes:     Conjunctiva/sclera: Conjunctivae normal.     Pupils: Pupils are equal, round, and reactive to light.  Neck:     Musculoskeletal: Neck supple. No neck rigidity or muscular tenderness.  Cardiovascular:     Rate and Rhythm: Normal rate and regular rhythm.     Pulses: Normal pulses.     Heart sounds: Murmur present.  Pulmonary:     Effort: Pulmonary effort is normal.     Breath sounds: Rhonchi present.  Abdominal:     General: Abdomen is flat. Bowel sounds are normal.     Palpations: Abdomen is soft.  Musculoskeletal:        General: Swelling and tenderness present.  Skin:    General: Skin is warm and dry.     Capillary Refill: Capillary refill takes less than 2 seconds.     Findings: Bruising present.  Neurological:     General: No focal deficit present.     Mental Status: He is alert. Mental status is at baseline.  Psychiatric:        Mood and Affect: Mood normal.        Behavior: Behavior normal.      Labs and Imaging: CBC BMET  Recent Labs  Lab 11/08/18 2052  WBC 10.7*  HGB 7.9*  HCT 23.6*  PLT 149*   Recent Labs  Lab 11/08/18 1622  NA 137  K 4.1  CL 107  CO2 24  BUN 22  CREATININE 0.91  GLUCOSE 138*  CALCIUM 8.1*     Dg Chest 1 View  Result Date: 11/05/2018 CLINICAL DATA:  Near-syncope. EXAM: CHEST  1 VIEW COMPARISON:  Chest x-ray dated March 19, 2017. FINDINGS: Stable mild cardiomegaly status post CABG. Normal mediastinal contours. Atherosclerotic calcification of the aortic arch. Normal pulmonary vascularity. No focal consolidation, pleural effusion, or pneumothorax. No acute osseous abnormality.  IMPRESSION: No active disease. Electronically Signed   By: Titus Dubin M.D.   On: 11/05/2018 17:40   Ct Pelvis Wo Contrast  Result Date: 11/08/2018 CLINICAL DATA:  Severe right hip pain after multiple  falls. EXAM: CT PELVIS WITHOUT CONTRAST TECHNIQUE: Multidetector CT imaging of the pelvis was performed following the standard protocol without intravenous contrast. COMPARISON:  Right hip x-rays from same day. FINDINGS: Urinary Tract:  No abnormality visualized. Bowel:  Unremarkable visualized pelvic bowel loops. Vascular/Lymphatic: Aortoiliac atherosclerotic vascular disease. No pathologically enlarged lymph nodes. Reproductive:  Mild prostatomegaly. Other:  Nonspecific presacral stranding. Musculoskeletal: Ill-defined hyperdensity within and between the right gluteus maximus and medius muscles extending inferiorly into the posterior upper thigh with mass effect and medial displacement of the sciatic nerve, consistent with hematoma. The hematoma is difficult to measure, but spans a craniocaudal length of approximately 20.6 cm. Superficial soft tissue edema in the lateral right hip. No acute fracture or dislocation. Mild bilateral hip joint space narrowing. Severe degenerative disc disease at L5-S1. IMPRESSION: 1. Ill-defined large right gluteal hematoma extending inferiorly into the posterior upper thigh with mass effect and medial displacement the sciatic nerve. 2.  No acute osseous abnormality. 3.  Aortic atherosclerosis (ICD10-I70.0). Electronically Signed   By: Titus Dubin M.D.   On: 11/08/2018 10:48   Dg Chest Port 1 View  Result Date: 11/08/2018 CLINICAL DATA:  RIGHT femur pain, fell on Monday EXAM: PORTABLE CHEST 1 VIEW COMPARISON:  Portable exam 1630 hours compared to 11/05/2018 FINDINGS: Normal heart size post CABG. Atherosclerotic calcification aorta. Mediastinal contours and pulmonary vascularity normal. Lungs clear. No pulmonary infiltrate, pleural effusion or pneumothorax. IMPRESSION: No acute abnormalities. Electronically Signed   By: Lavonia Dana M.D.   On: 11/08/2018 16:46   Dg Hip Unilat W Or Wo Pelvis 2-3 Views Right  Result Date: 11/08/2018 CLINICAL DATA:  Right lateral hip pain after  a recent fall. EXAM: DG HIP (WITH OR WITHOUT PELVIS) 2-3V RIGHT COMPARISON:  11/05/2018 FINDINGS: No acute fracture or hip dislocation is identified. Hip joint space widths are preserved with at most mild acetabular spurring. Atherosclerotic vascular calcifications are noted. IMPRESSION: No acute osseous abnormality identified. Electronically Signed   By: Logan Bores M.D.   On: 11/08/2018 09:11   Dg Hip Unilat W Or Wo Pelvis 2-3 Views Right  Result Date: 11/05/2018 CLINICAL DATA:  Right hip pain after fall last night. EXAM: DG HIP (WITH OR WITHOUT PELVIS) 2-3V RIGHT COMPARISON:  None. FINDINGS: There is no evidence of hip fracture or dislocation. There is no evidence of arthropathy or other focal bone abnormality. IMPRESSION: Negative. Electronically Signed   By: Titus Dubin M.D.   On: 11/05/2018 17:41   Dg Femur Portable 1 View Right  Result Date: 11/08/2018 CLINICAL DATA:  Fall EXAM: RIGHT FEMUR PORTABLE 1 VIEW COMPARISON:  Portable AP exam at 1638 hours without priors for comparison FINDINGS: Osseous mineralization normal. Knee and hip joint spaces preserved. No acute fracture, dislocation, or bone destruction. Scattered atherosclerotic calcifications. IMPRESSION: No acute osseous abnormalities. Electronically Signed   By: Lavonia Dana M.D.   On: 11/08/2018 16:47     Matilde Haymaker, MD 11/09/2018, 12:45 AM PGY-1, [place] Intern pager: [phone], text pages welcome  FPTS Upper-Level Resident Addendum   I have independently interviewed and examined the patient. I have discussed the above with the original author and agree with their documentation. My edits for correction/addition/clarification are in pink. Please see also any attending notes.  Lucila Maine, DO PGY-3, [place] Service pager: [phone] (text pages welcome through [place])

## 2018-11-09 NOTE — Progress Notes (Signed)
Family Medicine Teaching Service Daily Progress Note Intern Pager: [phone]  Patient name: Edgar Thomas Lacour Medical record number: [id number] Date of birth: 05/01/1930 Age: 85 y.o. Gender: male  Primary Care Provider: Simone CuriaLee, Keung, MD Consultants: Ortho Code Status: FULL  Pt Overview and Major Events to Date:  6/12-admitted for syncope likely secondary to symptomatic anemia  Assessment and Plan: Edgar Thomas Renfrew is a 85 y.o. male presenting with syncope and symptomatic anemia.  His past medical history is significant for CAD s/p CABG x4 (11/2013), A. fib on Eliquis, gait disturbance.  Right hip hematoma No new symptoms from admission.  Planning for orthopedics to see him today and in addition to recommendations from physical therapy and Occupational Therapy.  Hemoglobin remained remained stable after 1 unit packed RBCs increasing from 7.9-8.0.  He is bleeding into his right hip hematoma. -Ortho following, appreciate recommendations -Hold apixaban -Hold aspirin -CBC every 6 hours -Telemetry -Hold antihypertensive medication -Orthostatic vitals -PT/OT  Symptomatic Anemia, blood loss This significant drop in hemoglobin in the past week is secondary to third spacing into his right hip.  Ortho has been consulted and recommends no surgical intervention at this time.  Transfusion threshold under 8 due to history of CAD.  Vitals have remained stable overnight. -Monitor vitals -Transfuse for hemoglobin under 8 -Start iron supplementation  A. Fib Sinus rhythm on EKG in ED.  We will continue anti-arrhythmic medication and hold blood thinner. -Hold Eliquis -Continue dofetilide  Heart failure with reduced ejection fraction, chronic -Hold furosemide -Hold lisinopril -Hold metoprolol -Assess fluid status following 1 unit packed RBCs  Hx CAD s/p CABG x4 (2015) -Crestor 20 mg -Nitroglycerin SL. -hold ASA  Chronic leg pain -Continue gabapentin 100 mg twice daily  FEN/GI: Heart  healthy Prophylaxis: SCDs  Disposition: Pending evaluation by PT/OT  Subjective:  No acute events overnight.  No new complaints this morning.  He reports that he continues to have mild pain in his right hip.  He was informed that he would be evaluated by PT and would likely be recommended for SNF placement.  He understood that he would benefit from therapy.  Objective: Temp:  [98 F (36.7 C)-98.7 F (37.1 C)] 98.3 F (36.8 C) (06/13 0251) Pulse Rate:  [65-83] 78 (06/13 0251) Resp:  [12-24] 16 (06/13 0251) BP: (101-143)/(57-89) 141/87 (06/13 0251) SpO2:  [93 %-100 %] 99 % (06/13 0251) Weight:  [72.6 kg-74.7 kg] 74.7 kg (06/12 2134) Physical Exam: General: Alert and cooperative and appears to be in no acute distress Cardio: Normal S1 and S2, no S3 or S4. Rhythm is regular. No murmurs or rubs.   Pulm: Clear to auscultation bilaterally, no crackles, wheezing, or diminished breath sounds. Normal respiratory effort Abdomen: Bowel sounds normal. Abdomen soft and non-tender.  Extremities: No peripheral edema. Warm/ well perfused.  Strong radial pulse. Neuro: Cranial nerves grossly intact   Laboratory: Recent Labs  Lab 11/05/18 1651 11/08/18 1622 11/08/18 2052  WBC 10.6* 10.4 10.7*  HGB 12.4* 7.9* 7.9*  HCT 38.2* 24.4* 23.6*  PLT 146* 142* 149*   Recent Labs  Lab 11/05/18 1651 11/08/18 1622  NA 140 137  K 4.7 4.1  CL 110 107  CO2 25 24  BUN 22 22  CREATININE 0.85 0.91  CALCIUM 8.7* 8.1*  PROT  --  5.4*  BILITOT  --  1.3*  ALKPHOS  --  44  ALT  --  13  AST  --  20  GLUCOSE 123* 138*    Imaging/Diagnostic Tests: Dg Chest 1 View  Result Date: 11/05/2018 CLINICAL DATA:  Near-syncope. EXAM: CHEST  1 VIEW COMPARISON:  Chest x-Edgar dated March 19, 2017. FINDINGS: Stable mild cardiomegaly status post CABG. Normal mediastinal contours. Atherosclerotic calcification of the aortic arch. Normal pulmonary vascularity. No focal consolidation, pleural effusion, or pneumothorax.  No acute osseous abnormality. IMPRESSION: No active disease. Electronically Signed   By: Titus Dubin M.D.   On: 11/05/2018 17:40   Ct Pelvis Wo Contrast  Result Date: 11/08/2018 CLINICAL DATA:  Severe right hip pain after multiple falls. EXAM: CT PELVIS WITHOUT CONTRAST TECHNIQUE: Multidetector CT imaging of the pelvis was performed following the standard protocol without intravenous contrast. COMPARISON:  Right hip x-rays from same day. FINDINGS: Urinary Tract:  No abnormality visualized. Bowel:  Unremarkable visualized pelvic bowel loops. Vascular/Lymphatic: Aortoiliac atherosclerotic vascular disease. No pathologically enlarged lymph nodes. Reproductive:  Mild prostatomegaly. Other:  Nonspecific presacral stranding. Musculoskeletal: Ill-defined hyperdensity within and between the right gluteus maximus and medius muscles extending inferiorly into the posterior upper thigh with mass effect and medial displacement of the sciatic nerve, consistent with hematoma. The hematoma is difficult to measure, but spans a craniocaudal length of approximately 20.6 cm. Superficial soft tissue edema in the lateral right hip. No acute fracture or dislocation. Mild bilateral hip joint space narrowing. Severe degenerative disc disease at L5-S1. IMPRESSION: 1. Ill-defined large right gluteal hematoma extending inferiorly into the posterior upper thigh with mass effect and medial displacement the sciatic nerve. 2.  No acute osseous abnormality. 3.  Aortic atherosclerosis (ICD10-I70.0). Electronically Signed   By: Titus Dubin M.D.   On: 11/08/2018 10:48   Dg Chest Port 1 View  Result Date: 11/08/2018 CLINICAL DATA:  RIGHT femur pain, fell on Monday EXAM: PORTABLE CHEST 1 VIEW COMPARISON:  Portable exam 1630 hours compared to 11/05/2018 FINDINGS: Normal heart size post CABG. Atherosclerotic calcification aorta. Mediastinal contours and pulmonary vascularity normal. Lungs clear. No pulmonary infiltrate, pleural effusion or  pneumothorax. IMPRESSION: No acute abnormalities. Electronically Signed   By: Lavonia Dana M.D.   On: 11/08/2018 16:46   Dg Hip Unilat W Or Wo Pelvis 2-3 Views Right  Result Date: 11/08/2018 CLINICAL DATA:  Right lateral hip pain after a recent fall. EXAM: DG HIP (WITH OR WITHOUT PELVIS) 2-3V RIGHT COMPARISON:  11/05/2018 FINDINGS: No acute fracture or hip dislocation is identified. Hip joint space widths are preserved with at most mild acetabular spurring. Atherosclerotic vascular calcifications are noted. IMPRESSION: No acute osseous abnormality identified. Electronically Signed   By: Logan Bores M.D.   On: 11/08/2018 09:11   Dg Hip Unilat W Or Wo Pelvis 2-3 Views Right  Result Date: 11/05/2018 CLINICAL DATA:  Right hip pain after fall last night. EXAM: DG HIP (WITH OR WITHOUT PELVIS) 2-3V RIGHT COMPARISON:  None. FINDINGS: There is no evidence of hip fracture or dislocation. There is no evidence of arthropathy or other focal bone abnormality. IMPRESSION: Negative. Electronically Signed   By: Titus Dubin M.D.   On: 11/05/2018 17:41   Dg Femur Portable 1 View Right  Result Date: 11/08/2018 CLINICAL DATA:  Fall EXAM: RIGHT FEMUR PORTABLE 1 VIEW COMPARISON:  Portable AP exam at 1638 hours without priors for comparison FINDINGS: Osseous mineralization normal. Knee and hip joint spaces preserved. No acute fracture, dislocation, or bone destruction. Scattered atherosclerotic calcifications. IMPRESSION: No acute osseous abnormalities. Electronically Signed   By: Lavonia Dana M.D.   On: 11/08/2018 16:47     Matilde Haymaker, MD 11/09/2018, 4:25 AM PGY-1, [facility] Intern  pager: [phone], text pages welcome

## 2018-11-09 NOTE — Progress Notes (Signed)
Wife updated on patient day and progress as well as plan of care. [name] at [phone]

## 2018-11-09 NOTE — Consult Note (Signed)
Orthopaedic Trauma Service (OTS) Consult   Patient ID: Edgar Thomas MRN: [id number] DOB/AGE: 11-04-1929 85 y.o.  Reason for Consult:Right hip hematoma Referring Physician: Dr. Shirlyn Goltz, MD [facility]  HPI: Edgar Thomas is an 85 y.o. male who is being seen in consultation at the request of Dr. Darl Thomas for evaluation of right thigh/hip hematoma.  Patient states that he was up and about on Monday turned around and fell on his right side.  Had immediate pain and continue to worsen over the course of the week.  Yesterday he presents emergency room where CT scan showed a gluteal hematoma.  He was discharged home but subsequent was not able to mobilize and return to the emergency room.  Hemoglobin yesterday showed a drop in the level to 7.9.  He was 12.4 earlier in the week.  Patient is on Eliquis for atrial fibrillation.  He ambulates without assist device at baseline.  Currently the patient was seen on 5 W.  States that his pain is not significant if he does not move his leg.  However if he bends and moves his leg it causes significant pain.  Denies any numbness or tingling.  Denies any other injuries anywhere else.  Past Medical History:  Diagnosis Date  . A-fib ([place])   . CAD (coronary artery disease)   . Constipation   . GERD (gastroesophageal reflux disease)   . Heart attack ([place])   . Heartburn   . Hyperlipemia   . Hypertension     Past Surgical History:  Procedure Laterality Date  . balloon angioplasty of LAD    . COLONOSCOPY    . CORONARY ARTERY BYPASS GRAFT N/A 12/03/2013   Procedure: CORONARY ARTERY BYPASS GRAFTING (CABG) x4 using left internal mammary artery and right greater saphenous vein. LIMA to LAD, sequential SVG to OM 1 & OM 2, SVG to PD;  Surgeon: Edgar Isaac, MD;  Location: [place];  Service: Open Heart Surgery;  Laterality: N/A;  . ENDOVEIN HARVEST OF GREATER SAPHENOUS VEIN Right 12/03/2013   Procedure: ENDOVEIN HARVEST OF GREATER SAPHENOUS VEIN;  Surgeon: Edgar Isaac, MD;  Location: [facility];  Service: Open Heart Surgery;  Laterality: Right;  . INTRAOPERATIVE TRANSESOPHAGEAL ECHOCARDIOGRAM N/A 12/03/2013   Procedure: INTRAOPERATIVE TRANSESOPHAGEAL ECHOCARDIOGRAM;  Surgeon: Edgar Isaac, MD;  Location: [place];  Service: Open Heart Surgery;  Laterality: N/A;    Family History  Problem Relation Age of Onset  . Heart disease Mother        died at age 62 of heart attack    Social History:  reports that he has never smoked. He has never used smokeless tobacco. He reports that he does not drink alcohol or use drugs.  Allergies:  Allergies  Allergen Reactions  . Contrast Media [Iodinated Diagnostic Agents] Rash    19 years ago at [facility]  . Metrizamide Rash    19 years ago at [facility] (Amipaque)  . Other Other (See Comments)    Patient preference: NO MEAT OR DAIRY!!  . Amiodarone Other (See Comments)    Makes the patient walk sideways and he falls  . Ciprofloxacin Other (See Comments)    Was told by MD to "not take"  . Tramadol Nausea And Vomiting    Medications:  No current facility-administered medications on file prior to encounter.    Current Outpatient Medications on File Prior to Encounter  Medication Sig Dispense Refill  . aspirin EC 81 MG tablet Take 81 mg  by mouth every morning.     . Cholecalciferol (VITAMIN D-3) 25 MCG (1000 UT) CAPS Take 1,000 Units by mouth daily after breakfast.    . CRESTOR 20 MG tablet Take 20 mg by mouth every morning.   3  . dofetilide (TIKOSYN) 125 MCG capsule Take 125 mcg by mouth 2 (two) times daily.    Marland Kitchen ELIQUIS 5 MG TABS tablet Take 5 mg by mouth 2 (two) times daily.  0  . furosemide (LASIX) 40 MG tablet Take 20 mg by mouth every morning.     . gabapentin (NEURONTIN) 100 MG capsule Take 100 mg by mouth 2 (two) times a day. Morning and bedtime    . lisinopril (ZESTRIL) 5 MG tablet Take 5 mg by mouth every morning.     . metoprolol tartrate (LOPRESSOR) 50 MG tablet  Take 25 mg by mouth 2 (two) times daily.     . Multiple Vitamins-Minerals (ONE-A-DAY MENS 50+ ADVANTAGE) TABS Take 1 tablet by mouth daily with breakfast.     . multivitamin-lutein (OCUVITE-LUTEIN) CAPS capsule Take 1 capsule by mouth daily with breakfast.     . nitroGLYCERIN (NITROSTAT) 0.4 MG SL tablet Place 0.4 mg under the tongue every 5 (five) minutes as needed for chest pain.    . NON FORMULARY Place under the tongue See admin instructions. CBD oil: Place 2-3 droppersful sublingually 2 times a day    . Omega 3 1000 MG CAPS Take 1,000 mg by mouth daily with breakfast.     . potassium chloride SA (K-DUR,KLOR-CON) 20 MEQ tablet Take 20 mEq by mouth every morning.     . Sodium Chloride-Sodium Bicarb (NETI POT SINUS WASH) 2300-700 MG KIT Place into the nose daily as needed (for congestion).     . benzonatate (TESSALON) 100 MG capsule Take 1 capsule (100 mg total) by mouth 3 (three) times daily as needed for cough. (Patient not taking: Reported on 11/08/2018) 45 capsule 0  . guaiFENesin (MUCINEX) 600 MG 12 hr tablet Take 2 tablets (1,200 mg total) by mouth 2 (two) times daily. (Patient not taking: Reported on 11/08/2018) 60 tablet 0  . polyethylene glycol (MIRALAX / GLYCOLAX) packet Take 17 g by mouth daily as needed for moderate constipation. (Patient not taking: Reported on 11/08/2018) 30 each 0    ROS: Constitutional: No fever or chills Vision: No changes in vision ENT: No difficulty swallowing CV: No chest pain Pulm: No SOB or wheezing GI: No nausea or vomiting GU: No urgency or inability to hold urine Skin: No poor wound healing Neurologic: No numbness or tingling Psychiatric: No depression or anxiety Heme: No bruising Allergic: No reaction to medications or food   Exam: Blood pressure (!) 145/80, pulse 81, temperature 98.6 F (37 C), resp. rate 17, height 5' 10" (1.778 m), weight 74.7 kg, SpO2 95 %. General: No acute distress Orientation: Awake alert and oriented Mood and Affect:  Cooperative and pleasant Gait: Did not assess today Coordination and balance: Within normal limits  Right lower extremity: Notable bruising on the laterally.  Thigh is notably swollen compared to the contralateral side.  Compartments are soft and compressible.  Pain with gentle hip flexion or internal and external rotation.  He has 5 out of 5 strength distally in his dorsiflexion plantarflexion of his foot and toes.  He has a warm well-perfused foot with 2+ DP pulses.  He endorses full sensation to the dorsum and plantar aspect of the foot that is comparable to contralateral side.  No lymphadenopathy.  Left lower extremity: Skin without lesions. No tenderness to palpation. Full painless ROM, full strength in each muscle groups without evidence of instability.   Medical Decision Making: Imaging: CT scan was reviewed which shows a gluteal hematoma that is non-distinct in nature.  Some mass artifact to the sciatic nerve however no signs of intra-articular involvement.  No fracture identified  Labs:  Results for orders placed or performed during the hospital encounter of 11/08/18 (from the past 24 hour(s))  CBC with Differential/Platelet     Status: Abnormal   Collection Time: 11/08/18  4:22 PM  Result Value Ref Range   WBC 10.4 4.0 - 10.5 K/uL   RBC 2.38 (L) 4.22 - 5.81 MIL/uL   Hemoglobin 7.9 (L) 13.0 - 17.0 g/dL   HCT 24.4 (L) 39.0 - 52.0 %   MCV 102.5 (H) 80.0 - 100.0 fL   MCH 33.2 26.0 - 34.0 pg   MCHC 32.4 30.0 - 36.0 g/dL   RDW 13.0 11.5 - 15.5 %   Platelets 142 (L) 150 - 400 K/uL   nRBC 0.0 0.0 - 0.2 %   Neutrophils Relative % 77 %   Neutro Abs 7.9 (H) 1.7 - 7.7 K/uL   Lymphocytes Relative 8 %   Lymphs Abs 0.8 0.7 - 4.0 K/uL   Monocytes Relative 14 %   Monocytes Absolute 1.5 (H) 0.1 - 1.0 K/uL   Eosinophils Relative 0 %   Eosinophils Absolute 0.0 0.0 - 0.5 K/uL   Basophils Relative 0 %   Basophils Absolute 0.0 0.0 - 0.1 K/uL   Immature Granulocytes 1 %   Abs Immature  Granulocytes 0.06 0.00 - 0.07 K/uL  Comprehensive metabolic panel     Status: Abnormal   Collection Time: 11/08/18  4:22 PM  Result Value Ref Range   Sodium 137 135 - 145 mmol/L   Potassium 4.1 3.5 - 5.1 mmol/L   Chloride 107 98 - 111 mmol/L   CO2 24 22 - 32 mmol/L   Glucose, Bld 138 (H) 70 - 99 mg/dL   BUN 22 8 - 23 mg/dL   Creatinine, Ser 0.91 0.61 - 1.24 mg/dL   Calcium 8.1 (L) 8.9 - 10.3 mg/dL   Total Protein 5.4 (L) 6.5 - 8.1 g/dL   Albumin 2.6 (L) 3.5 - 5.0 g/dL   AST 20 15 - 41 U/L   ALT 13 0 - 44 U/L   Alkaline Phosphatase 44 38 - 126 U/L   Total Bilirubin 1.3 (H) 0.3 - 1.2 mg/dL   GFR calc non Af Amer >60 >60 mL/min   GFR calc Af Amer >60 >60 mL/min   Anion gap 6 5 - 15  Type and screen     Status: None   Collection Time: 11/08/18  4:22 PM  Result Value Ref Range   ABO/RH(D) O POS    Antibody Screen NEG    Sample Expiration 11/11/2018,2359    Unit Number [id number]    Blood Component Type RBC LR PHER1    Unit division 00    Status of Unit ISSUED,FINAL    Transfusion Status OK TO TRANSFUSE    Crossmatch Result      Compatible Performed at [facility], [address] [place], [place] [address]   CK     Status: None   Collection Time: 11/08/18  4:22 PM  Result Value Ref Range   Total CK 110 49 - 397 U/L  I-stat troponin, ED     Status: None   Collection  Time: 11/08/18  4:27 PM  Result Value Ref Range   Troponin i, poc 0.03 0.00 - 0.08 ng/mL   Comment 3          CBC     Status: Abnormal   Collection Time: 11/08/18  8:52 PM  Result Value Ref Range   WBC 10.7 (H) 4.0 - 10.5 K/uL   RBC 2.37 (L) 4.22 - 5.81 MIL/uL   Hemoglobin 7.9 (L) 13.0 - 17.0 g/dL   HCT 23.6 (L) 39.0 - 52.0 %   MCV 99.6 80.0 - 100.0 fL   MCH 33.3 26.0 - 34.0 pg   MCHC 33.5 30.0 - 36.0 g/dL   RDW 12.9 11.5 - 15.5 %   Platelets 149 (L) 150 - 400 K/uL   nRBC 0.0 0.0 - 0.2 %  Prepare RBC     Status: None   Collection Time: 11/08/18  9:01 PM  Result Value Ref Range   Order  Confirmation      ORDER PROCESSED BY BLOOD BANK Performed at [facility], [place] [address] [place], [place] [address]   CBC     Status: Abnormal   Collection Time: 11/09/18  5:31 AM  Result Value Ref Range   WBC 8.9 4.0 - 10.5 K/uL   RBC 2.44 (L) 4.22 - 5.81 MIL/uL   Hemoglobin 8.0 (L) 13.0 - 17.0 g/dL   HCT 23.6 (L) 39.0 - 52.0 %   MCV 96.7 80.0 - 100.0 fL   MCH 32.8 26.0 - 34.0 pg   MCHC 33.9 30.0 - 36.0 g/dL   RDW 14.2 11.5 - 15.5 %   Platelets 124 (L) 150 - 400 K/uL   nRBC 0.0 0.0 - 0.2 %  Basic metabolic panel     Status: Abnormal   Collection Time: 11/09/18  5:31 AM  Result Value Ref Range   Sodium 137 135 - 145 mmol/L   Potassium 3.7 3.5 - 5.1 mmol/L   Chloride 109 98 - 111 mmol/L   CO2 23 22 - 32 mmol/L   Glucose, Bld 107 (H) 70 - 99 mg/dL   BUN 21 8 - 23 mg/dL   Creatinine, Ser 0.74 0.61 - 1.24 mg/dL   Calcium 8.0 (L) 8.9 - 10.3 mg/dL   GFR calc non Af Amer >60 >60 mL/min   GFR calc Af Amer >60 >60 mL/min   Anion gap 5 5 - 15    Medical history and chart was reviewed  Assessment/Plan: 85 year old male with a history of atrial fibrillation, coronary artery disease status post CABG, heart failure who presents with a right gluteal hematoma.  Patient is having no neurological symptoms.  There is no signs of compartment syndrome.  There is no indication for surgical intervention.  I recommend continue to observe patient.  Patient's hemoglobin has not fully responded.  If he continues to drop lower I would recommend a reversal with FFP.  We will continue to follow along however with the benign clinical exam I do not feel that there is any role for surgical debridement in the future.  Shona Needles, MD [facility] [phone] (phone) [phone] (office) [email or web address]

## 2018-11-10 DIAGNOSIS — Z1159 Encounter for screening for other viral diseases: Secondary | ICD-10-CM | POA: Diagnosis not present

## 2018-11-10 DIAGNOSIS — Z888 Allergy status to other drugs, medicaments and biological substances status: Secondary | ICD-10-CM | POA: Diagnosis not present

## 2018-11-10 DIAGNOSIS — M25551 Pain in right hip: Secondary | ICD-10-CM | POA: Diagnosis present

## 2018-11-10 DIAGNOSIS — I10 Essential (primary) hypertension: Secondary | ICD-10-CM

## 2018-11-10 DIAGNOSIS — Z79899 Other long term (current) drug therapy: Secondary | ICD-10-CM | POA: Diagnosis not present

## 2018-11-10 DIAGNOSIS — I5022 Chronic systolic (congestive) heart failure: Secondary | ICD-10-CM | POA: Diagnosis present

## 2018-11-10 DIAGNOSIS — Z7901 Long term (current) use of anticoagulants: Secondary | ICD-10-CM | POA: Diagnosis not present

## 2018-11-10 DIAGNOSIS — I2510 Atherosclerotic heart disease of native coronary artery without angina pectoris: Secondary | ICD-10-CM | POA: Diagnosis present

## 2018-11-10 DIAGNOSIS — K219 Gastro-esophageal reflux disease without esophagitis: Secondary | ICD-10-CM | POA: Diagnosis present

## 2018-11-10 DIAGNOSIS — S7011XA Contusion of right thigh, initial encounter: Secondary | ICD-10-CM | POA: Diagnosis present

## 2018-11-10 DIAGNOSIS — W010XXA Fall on same level from slipping, tripping and stumbling without subsequent striking against object, initial encounter: Secondary | ICD-10-CM | POA: Diagnosis present

## 2018-11-10 DIAGNOSIS — I252 Old myocardial infarction: Secondary | ICD-10-CM | POA: Diagnosis not present

## 2018-11-10 DIAGNOSIS — S300XXA Contusion of lower back and pelvis, initial encounter: Secondary | ICD-10-CM | POA: Diagnosis present

## 2018-11-10 DIAGNOSIS — Y92009 Unspecified place in unspecified non-institutional (private) residence as the place of occurrence of the external cause: Secondary | ICD-10-CM | POA: Diagnosis not present

## 2018-11-10 DIAGNOSIS — W19XXXA Unspecified fall, initial encounter: Secondary | ICD-10-CM | POA: Diagnosis present

## 2018-11-10 DIAGNOSIS — Z961 Presence of intraocular lens: Secondary | ICD-10-CM | POA: Diagnosis present

## 2018-11-10 DIAGNOSIS — I4891 Unspecified atrial fibrillation: Secondary | ICD-10-CM | POA: Diagnosis not present

## 2018-11-10 DIAGNOSIS — D62 Acute posthemorrhagic anemia: Secondary | ICD-10-CM | POA: Diagnosis present

## 2018-11-10 DIAGNOSIS — Z951 Presence of aortocoronary bypass graft: Secondary | ICD-10-CM | POA: Diagnosis not present

## 2018-11-10 DIAGNOSIS — Z881 Allergy status to other antibiotic agents status: Secondary | ICD-10-CM | POA: Diagnosis not present

## 2018-11-10 DIAGNOSIS — R9082 White matter disease, unspecified: Secondary | ICD-10-CM | POA: Diagnosis present

## 2018-11-10 DIAGNOSIS — I080 Rheumatic disorders of both mitral and aortic valves: Secondary | ICD-10-CM | POA: Diagnosis present

## 2018-11-10 DIAGNOSIS — S7001XA Contusion of right hip, initial encounter: Secondary | ICD-10-CM | POA: Diagnosis present

## 2018-11-10 DIAGNOSIS — I110 Hypertensive heart disease with heart failure: Secondary | ICD-10-CM | POA: Diagnosis present

## 2018-11-10 DIAGNOSIS — I480 Paroxysmal atrial fibrillation: Secondary | ICD-10-CM | POA: Diagnosis not present

## 2018-11-10 DIAGNOSIS — Z91041 Radiographic dye allergy status: Secondary | ICD-10-CM | POA: Diagnosis not present

## 2018-11-10 DIAGNOSIS — E785 Hyperlipidemia, unspecified: Secondary | ICD-10-CM | POA: Diagnosis present

## 2018-11-10 DIAGNOSIS — D649 Anemia, unspecified: Secondary | ICD-10-CM | POA: Diagnosis not present

## 2018-11-10 DIAGNOSIS — I4819 Other persistent atrial fibrillation: Secondary | ICD-10-CM

## 2018-11-10 DIAGNOSIS — R55 Syncope and collapse: Secondary | ICD-10-CM | POA: Diagnosis not present

## 2018-11-10 DIAGNOSIS — M7989 Other specified soft tissue disorders: Secondary | ICD-10-CM | POA: Diagnosis present

## 2018-11-10 DIAGNOSIS — I951 Orthostatic hypotension: Secondary | ICD-10-CM | POA: Diagnosis present

## 2018-11-10 DIAGNOSIS — H3530 Unspecified macular degeneration: Secondary | ICD-10-CM | POA: Diagnosis present

## 2018-11-10 DIAGNOSIS — I25708 Atherosclerosis of coronary artery bypass graft(s), unspecified, with other forms of angina pectoris: Secondary | ICD-10-CM

## 2018-11-10 LAB — CBC
HCT: 23.6 % — ABNORMAL LOW (ref 39.0–52.0)
HCT: 31.8 % — ABNORMAL LOW (ref 39.0–52.0)
HCT: 28.2 % — ABNORMAL LOW (ref 39.0–52.0)
HCT: 29.4 % — ABNORMAL LOW (ref 39.0–52.0)
Hemoglobin: 7.8 g/dL — ABNORMAL LOW (ref 13.0–17.0)
Hemoglobin: 10.6 g/dL — ABNORMAL LOW (ref 13.0–17.0)
Hemoglobin: 9.3 g/dL — ABNORMAL LOW (ref 13.0–17.0)
Hemoglobin: 9.7 g/dL — ABNORMAL LOW (ref 13.0–17.0)
MCH: 32.2 pg (ref 26.0–34.0)
MCH: 32.2 pg (ref 26.0–34.0)
MCH: 31.7 pg (ref 26.0–34.0)
MCH: 31.8 pg (ref 26.0–34.0)
MCHC: 33.1 g/dL (ref 30.0–36.0)
MCHC: 33.3 g/dL (ref 30.0–36.0)
MCHC: 33 g/dL (ref 30.0–36.0)
MCHC: 33 g/dL (ref 30.0–36.0)
MCV: 97.5 fL (ref 80.0–100.0)
MCV: 96.7 fL (ref 80.0–100.0)
MCV: 96.2 fL (ref 80.0–100.0)
MCV: 96.4 fL (ref 80.0–100.0)
Platelets: 146 10*3/uL — ABNORMAL LOW (ref 150–400)
Platelets: 181 10*3/uL (ref 150–400)
Platelets: 157 10*3/uL (ref 150–400)
Platelets: 169 10*3/uL (ref 150–400)
RBC: 2.42 MIL/uL — ABNORMAL LOW (ref 4.22–5.81)
RBC: 3.29 MIL/uL — ABNORMAL LOW (ref 4.22–5.81)
RBC: 2.93 MIL/uL — ABNORMAL LOW (ref 4.22–5.81)
RBC: 3.05 MIL/uL — ABNORMAL LOW (ref 4.22–5.81)
RDW: 14.2 % (ref 11.5–15.5)
RDW: 14.2 % (ref 11.5–15.5)
RDW: 14.1 % (ref 11.5–15.5)
RDW: 14.1 % (ref 11.5–15.5)
WBC: 8.4 10*3/uL (ref 4.0–10.5)
WBC: 7.9 10*3/uL (ref 4.0–10.5)
WBC: 8.1 10*3/uL (ref 4.0–10.5)
WBC: 7.3 10*3/uL (ref 4.0–10.5)
nRBC: 0 % (ref 0.0–0.2)
nRBC: 0 % (ref 0.0–0.2)
nRBC: 0 % (ref 0.0–0.2)
nRBC: 0 % (ref 0.0–0.2)

## 2018-11-10 LAB — PREPARE RBC (CROSSMATCH)

## 2018-11-10 LAB — BASIC METABOLIC PANEL
Anion gap: 5 (ref 5–15)
BUN: 22 mg/dL (ref 8–23)
CO2: 23 mmol/L (ref 22–32)
Calcium: 7.9 mg/dL — ABNORMAL LOW (ref 8.9–10.3)
Chloride: 109 mmol/L (ref 98–111)
Creatinine, Ser: 0.79 mg/dL (ref 0.61–1.24)
GFR calc Af Amer: >60 mL/min (ref >60)
GFR calc non Af Amer: >60 mL/min (ref >60)
Glucose, Bld: 106 mg/dL — ABNORMAL HIGH (ref 70–99)
Potassium: 3.8 mmol/L (ref 3.5–5.1)
Sodium: 137 mmol/L (ref 135–145)

## 2018-11-10 LAB — MAGNESIUM: Magnesium: 1.8 mg/dL (ref 1.7–2.4)

## 2018-11-10 MED ORDER — METOPROLOL TARTRATE 25 MG PO TABS
25.0000 mg | ORAL_TABLET | Freq: Two times a day (BID) | ORAL | Status: DC
  Administered 2018-11-10 – 2018-11-12 (×5): 25 mg via ORAL
  Filled 2018-11-10 (×5): qty 1

## 2018-11-10 MED ORDER — SODIUM CHLORIDE 0.9% IV SOLUTION
Freq: Once | INTRAVENOUS | Status: AC
  Administered 2018-11-10: 06:00:00 via INTRAVENOUS

## 2018-11-10 MED ORDER — LISINOPRIL 5 MG PO TABS
5.0000 mg | ORAL_TABLET | Freq: Every day | ORAL | Status: DC
  Administered 2018-11-10 – 2018-11-12 (×3): 5 mg via ORAL
  Filled 2018-11-10 (×3): qty 1

## 2018-11-10 MED ORDER — FUROSEMIDE 10 MG/ML IJ SOLN
20.0000 mg | Freq: Once | INTRAMUSCULAR | Status: AC
  Administered 2018-11-10: 20 mg via INTRAVENOUS
  Filled 2018-11-10: qty 2

## 2018-11-10 NOTE — Care Management Obs Status (Signed)
[place] NOTIFICATION   Patient Details  Name: Abimael Zeiter MRN: [id number] Date of Birth: 12-10-1929   Medicare Observation Status Notification Given:  Yes    Carles Collet, RN 11/10/2018, 7:41 AM

## 2018-11-10 NOTE — Progress Notes (Signed)
Wife called and requested update, she was given and update and is awaiting a call from MD.

## 2018-11-10 NOTE — Progress Notes (Signed)
Patient wife updated and ask the doctors to call her.  Paged placed to MD to make aware. Wife stated she had a walker and bedside commode at home already.

## 2018-11-10 NOTE — Progress Notes (Signed)
Family Medicine Teaching Service Daily Progress Note Intern Pager: [phone]  Patient name: Edgar Thomas Medical record number: [id number] Date of birth: [birth year] Age: 85 y.o. Gender: male  Primary Care Provider: [name], MD Consultants: none Code Status: full  Pt Overview and Major Events to Date:  6/13 admitted with syncope from R hip hematoma  Assessment and Plan: Edgar Thomas is a 85 y.o. male presenting with syncope and symptomatic anemia.  His past medical history is significant for CAD s/p CABG x4 (11/2013), A. fib on Eliquis, gait disturbance.  Anemia 2/2 R hip hematoma. In the setting of fall on anticoagulation. Hgb 7.9>s/p 1U RBC>8.5>8.1>7.8 showing a slow drift downwards. R hip area with soft tissue edema as well. Transfusion threshold 8 due to significant CAD history. -Per ortho, no indication for surgical intervention. Could consider reversal with FFP if Hgb drops further. Following. Appreciate additional recommendations -Holding apixaban and aspirin -Transfuse 1U RBC this morning, giving lasix 20mg  as well -May need FFP this afternoon -CBC every 6 hours -Telemetry -Monitor vitals every 4 hours -Hold antihypertensive medication -Orthostatic vitals -continue iron supplementation -PT/OT  A. Fib -Holding Eliquis as per above -Continue dofetilide  HFrEF, chronic. Not in exacerbation.  -Hold furosemide -Hold lisinopril -Hold metoprolol -Monitor fluid status, giving IV lasix with transfusion this morning  Hx CAD s/p CABG x4 (2015) -Crestor 20 mg -Nitroglycerin SL prn -hold ASA  Chronic leg pain -Continue gabapentin 100 mg twice daily  FEN/GI:Heart healthy, miralax prn Prophylaxis:SCDs  Disposition: pending medical management  Subjective:  Patient states that he feels well today. He states that his R hip area is not very painful. He has no lightheadedness or dizziness this morning  Objective: Temp:  [98.2 F (36.8 C)-99.9 F (37.7 C)] 99.9 F  (37.7 C) (06/14 0600) Pulse Rate:  [74-88] 82 (06/14 0600) Resp:  [18-20] 20 (06/14 0600) BP: (123-161)/(48-83) 160/73 (06/14 0600) SpO2:  [97 %-99 %] 98 % (06/14 0600) Physical Exam: General: laying in bed, in NAD Cardiovascular: RRR, no murmurs Respiratory: CTAB, NWOB on RA Abdomen: soft, nontender, nondistended, + bowel sounds Extremities: R hip area with thigh swelling but still soft and compressible and nontender to palpation. No abdominal or lower leg swelling.  Laboratory: Recent Labs  Lab 11/09/18 1433 11/09/18 2116 11/10/18 0253  WBC 9.0 9.2 8.4  HGB 8.5* 8.1* 7.8*  HCT 25.1* 24.0* 23.6*  PLT 141* 145* 146*   Recent Labs  Lab 11/08/18 1622 11/09/18 0531 11/10/18 0253  NA 137 137 137  K 4.1 3.7 3.8  CL 107 109 109  CO2 24 23 23   BUN 22 21 22   CREATININE 0.91 0.74 0.79  CALCIUM 8.1* 8.0* 7.9*  PROT 5.4*  --   --   BILITOT 1.3*  --   --   ALKPHOS 44  --   --   ALT 13  --   --   AST 20  --   --   GLUCOSE 138* 107* 106*     Imaging/Diagnostic Tests: Ct Head Wo Contrast  Result Date: 11/09/2018 CLINICAL DATA:  Headache.  Unwitnessed fall.  Anti coagulation. EXAM: CT HEAD WITHOUT CONTRAST TECHNIQUE: Contiguous axial images were obtained from the base of the skull through the vertex without intravenous contrast. COMPARISON:  None. FINDINGS: Brain: 9 mm in diameter hypodense left thalamic lesion. 8 mm in diameter hypodense lesion of the lower portion of the right internal capsule. Both of these are probably age indeterminate lacunar infarcts. Periventricular white matter and corona radiata hypodensities favor  chronic ischemic microvascular white matter disease. No intracranial hemorrhage or mass lesion identified. No other significant findings. Vascular: Vertebrobasilar atherosclerosis. There is atherosclerotic calcification of the cavernous carotid arteries bilaterally. Skull: Unremarkable Sinuses/Orbits: Chronic ethmoid and left maxillary sinusitis. Other: No  supplemental non-categorized findings. IMPRESSION: 1. Hypodensities favoring age-indeterminate lacunar infarcts in the left thalamus and in the right internal capsule. 2. Periventricular white matter and corona radiata hypodensities favor chronic ischemic microvascular white matter disease. 3. No acute intracranial hemorrhage. 4. Chronic ethmoid and left maxillary sinusitis. 5. Atherosclerosis. Electronically Signed   By: Gaylyn RongWalter  Liebkemann M.D.   On: 11/09/2018 12:32    Leland HerYoo, Ilani Otterson J, DO 11/10/2018, 6:37 AM PGY-3, [facility]PT[facility] Intern pager: [phone], text pages welcome

## 2018-11-10 NOTE — Progress Notes (Signed)
Patient HR is up and down ranging for 90's-130's, patient converted from NSR to afib, Updated MD of these changes, and orders given for EKG.

## 2018-11-10 NOTE — Progress Notes (Addendum)
6/14 Called home phone number to talk to wife to update as requested. Phone rang multiple times. No answering service to leave message. Will try to update tomorrow.   6/15.  Call patient"s wife on mobile.  Updated on current status of husband.

## 2018-11-10 NOTE — Consult Note (Signed)
Cardiology Consultation:   Patient ID: Edgar Thomas MRN: [id number]; DOB: 15-Apr-1930  Admit date: 11/08/2018 Date of Consult: 11/10/2018  Primary Care Provider: [name], MD Primary Cardiologist: No primary care provider on file.  Primary Electrophysiologist:  None    Patient Profile:   Edgar Thomas is a 85 y.o. male with a hx of CABG and PAF who is being seen today for the evaluation of atrial fibrillation at the request of Dr. Shawna Orleans.  History of Present Illness:   [name] is an 85 year old male who was last seen by our group while hospitalized in April 2017.  He has a history of coronary artery disease and four-vessel CABG in July 2015 with a LIMA to the LAD and sequential SVG to OM1 and OM 2 and PDA.  Most recent echocardiogram in our system is a TEE performed on 12/05/2013 which demonstrated mild to moderately reduced left ventricular systolic function, LVEF 40 to 45%.  There was mild aortic and mitral regurgitation.  Prior to admission he had been taking dofetilide 125 mcg twice daily along with Lopressor 25 mg twice daily and Eliquis 5 mg twice daily for anticoagulation.  He was admitted to [facility] on 11/09/2018 with "syncope" and symptomatic anemia.  Hemoglobin was initially 7.9 for which he received 1 unit PRBC transfusion (hemoglobin had been 12.4 on 11/05/2018).  Went up to 8.5 and is back down to 7.8.  He got transfused again and hemoglobin is up to 10.6.  He developed a right hip hematoma.  Aspirin and apixaban have been held.  Metoprolol has also been held. I-STAT troponin was normal on 11/08/2018.  Head CT showed age-indeterminate lacunar infarcts in the left thalamus and in the right internal capsule.  There was also chronic ischemic microvascular white matter disease.  There was no acute intracranial hemorrhage.  CT pelvis showed right gluteal hematoma. Xrays showed no fractures.  Upon speaking with him, he denies loss of consciousness altogether. He said he was eating  chips and dip and placed it on the counter and then did "a near 360 degree turn" and lost his balance and fell. He denies antecedent chest pain, shortness of breath, and palpitations.  He also denies palpitations now and does not recall having a fast heart rate while hospitalized.  He sees Dr. Corlis Hove in [place] for his cardiology care, most recently about 6 months ago. He said he underwent a stress test and an echocardiogram at that time and was told his cardiac function had improved.     Past Medical History:  Diagnosis Date   A-fib [facility])    CAD (coronary artery disease)    Constipation    GERD (gastroesophageal reflux disease)    Heart attack ([place])    Heartburn    Hyperlipemia    Hypertension     Past Surgical History:  Procedure Laterality Date   balloon angioplasty of LAD     COLONOSCOPY     CORONARY ARTERY BYPASS GRAFT N/A 12/03/2013   Procedure: CORONARY ARTERY BYPASS GRAFTING (CABG) x4 using left internal mammary artery and right greater saphenous vein. LIMA to LAD, sequential SVG to OM 1 & OM 2, SVG to PD;  Surgeon: Grace Isaac, MD;  Location: [place];  Service: Open Heart Surgery;  Laterality: N/A;   ENDOVEIN HARVEST OF GREATER SAPHENOUS VEIN Right 12/03/2013   Procedure: ENDOVEIN HARVEST OF GREATER SAPHENOUS VEIN;  Surgeon: Grace Isaac, MD;  Location: [place];  Service: Open Heart Surgery;  Laterality:  Right;   INTRAOPERATIVE TRANSESOPHAGEAL ECHOCARDIOGRAM N/A 12/03/2013   Procedure: INTRAOPERATIVE TRANSESOPHAGEAL ECHOCARDIOGRAM;  Surgeon: Grace Isaac, MD;  Location: [place];  Service: Open Heart Surgery;  Laterality: N/A;     Home Medications:  Prior to Admission medications   Medication Sig Start Date End Date Taking? Authorizing Provider  aspirin EC 81 MG tablet Take 81 mg by mouth every morning.    Yes [provider]  Cholecalciferol (VITAMIN D-3) 25 MCG (1000 UT) CAPS Take 1,000 Units by mouth daily after breakfast.   Yes  [provider]  CRESTOR 20 MG tablet Take 20 mg by mouth every morning.  08/26/14  Yes [provider]  dofetilide (TIKOSYN) 125 MCG capsule Take 125 mcg by mouth 2 (two) times daily. 05/20/18  Yes [provider]  ELIQUIS 5 MG TABS tablet Take 5 mg by mouth 2 (two) times daily. 08/25/15  Yes [provider]  furosemide (LASIX) 40 MG tablet Take 20 mg by mouth every morning.  03/27/18  Yes [provider]  gabapentin (NEURONTIN) 100 MG capsule Take 100 mg by mouth 2 (two) times a day. Morning and bedtime 04/19/18  Yes [provider]  lisinopril (ZESTRIL) 5 MG tablet Take 5 mg by mouth every morning.  10/10/18  Yes [provider]  metoprolol tartrate (LOPRESSOR) 50 MG tablet Take 25 mg by mouth 2 (two) times daily.  04/02/18  Yes [provider]  Multiple Vitamins-Minerals (ONE-A-DAY MENS 50+ ADVANTAGE) TABS Take 1 tablet by mouth daily with breakfast.    Yes [provider]  multivitamin-lutein (OCUVITE-LUTEIN) CAPS capsule Take 1 capsule by mouth daily with breakfast.    Yes [provider]  nitroGLYCERIN (NITROSTAT) 0.4 MG SL tablet Place 0.4 mg under the tongue every 5 (five) minutes as needed for chest pain.   Yes [provider]  NON FORMULARY Place under the tongue See admin instructions. CBD oil: Place 2-3 droppersful sublingually 2 times a day   Yes [provider]  Omega 3 1000 MG CAPS Take 1,000 mg by mouth daily with breakfast.    Yes [provider]  potassium chloride SA (K-DUR,KLOR-CON) 20 MEQ tablet Take 20 mEq by mouth every morning.  05/20/18  Yes [provider]  Sodium Chloride-Sodium Bicarb (NETI POT SINUS [place]) 2300-700 MG KIT Place into the nose daily as needed (for congestion).    Yes [provider]  benzonatate (TESSALON) 100 MG capsule Take 1 capsule (100 mg total) by mouth 3 (three) times daily as needed for cough. Patient not taking:  Reported on 11/08/2018 08/30/15   Rai, Vernelle Emerald, MD  guaiFENesin (MUCINEX) 600 MG 12 hr tablet Take 2 tablets (1,200 mg total) by mouth 2 (two) times daily. Patient not taking: Reported on 11/08/2018 08/30/15   Rai, Vernelle Emerald, MD  polyethylene glycol (MIRALAX / GLYCOLAX) packet Take 17 g by mouth daily as needed for moderate constipation. Patient not taking: Reported on 11/08/2018 08/30/15   Mendel Corning, MD    Inpatient Medications: Scheduled Meds:  dofetilide  125 mcg Oral BID   gabapentin  100 mg Oral BID   rosuvastatin  20 mg Oral q morning - 10a   Continuous Infusions:  PRN Meds: acetaminophen **OR** acetaminophen, nitroGLYCERIN, polyethylene glycol  Allergies:    Allergies  Allergen Reactions   Contrast Media [Iodinated Diagnostic Agents] Rash    [facility] years ago at [facility]   Metrizamide Rash    19 years ago at [facility] ([place])   Other Other (See Comments)    Patient preference: NO MEAT OR DAIRY!!   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  Social Needs   Financial resource strain: Not on file   Food insecurity    Worry: Not on file    Inability: Not on file   Transportation needs    Medical: Not on file    Non-medical: Not on file  Tobacco Use   Smoking status: Never Smoker   Smokeless tobacco: Never Used  Substance and Sexual Activity   Alcohol use: No   Drug use: No   Sexual activity: Not on file  Lifestyle   Physical activity    Days per week: Not on file    Minutes per session: Not on file   Stress:  Not on file  Relationships   Social connections    Talks on phone: Not on file    Gets together: Not on file    Attends religious service: Not on file    Active member of club or organization: Not on file    Attends meetings of clubs or organizations: Not on file    Relationship status: Not on file   Intimate partner violence    Fear of current or ex partner: Not on file    Emotionally abused: Not on file    Physically abused: Not on file    Forced sexual activity: Not on file  Other Topics Concern   Not on file  Social History Narrative   Not on file    Family History:    Family History  Problem Relation Age of Onset   Heart disease Mother        died at age 13 of heart attack     ROS:  Please see the history of present illness.   All other ROS reviewed and negative.     Physical Exam/Data:   Vitals:   11/09/18 2110 11/10/18 0530 11/10/18 0600 11/10/18 0845  BP: (!) 145/72 (!) 161/83 (!) 160/73 (!) 154/74  Pulse: 82 88 82 83  Resp: _0 Temp: 99 F (37.2 C) 99.3 F (37.4 C) 99.9 F (37.7 C) 98 F (36.7 C)  TempSrc:  Oral Oral Oral  SpO2: 98% 97% 98% 98%  Weight:      Height:        Intake/Output Summary (Last 24 hours) at 11/10/2018 1337 Last data filed at 11/10/2018 1200 Gross per 24 hour  Intake 1061.25 ml  Output 700 ml  Net 361.25 ml   Last 3 Weights 11/09/2018 11/08/2018 11/08/2018  Weight (lbs) 164 lb 10.9 oz 164 lb 10.9 oz 160 lb  Weight (kg) 74.7 kg 74.7 kg 72.576 kg     Body mass index is 23.63 kg/m.  General:  Well nourished, well developed, in no acute distress HEENT: normal Lymph: no adenopathy Neck: no JVD Endocrine:  No thryomegaly Cardiac:  normal S1, S2; RRR; no murmur  Lungs:  clear to auscultation  bilaterally, no wheezing, rhonchi or rales  Abd: soft, nontender, no hepatomegaly  Ext: no edema Musculoskeletal:  No deformities, BUE and BLE strength normal and equal Skin: warm and dry  Neuro:  CNs 2-12 intact, no focal  abnormalities noted Psych:  Normal affect   EKG:  The EKG was personally reviewed and demonstrates: Sinus rhythm with frequent PACs and brief atrial run Telemetry:  Telemetry was personally reviewed and demonstrates:  Sinus rhythm with frequent PAC's, PVC's, 6 beat run of NSVT, and paroxysm of rapid atrial fibrillation (HR 141 bpm). Currently in sinus with PAC's.  Relevant CV Studies: See above I have requested cardiac records including most recent stress test and echocardiogram from Dr. Donnal Debar in [place] (completed "about 6 months ago").   Laboratory Data:  Chemistry Recent Labs  Lab 11/08/18 1622 11/09/18 0531 11/10/18 0253  NA 137 137 137  K 4.1 3.7 3.8  CL 107 109 109  CO2 _0 GLUCOSE 138* 107* 106*  BUN _1 CREATININE 0.91 0.74 0.79  CALCIUM 8.1* 8.0* 7.9*  GFRNONAA >60 >60 >60  GFRAA >60 >60 >60  ANIONGAP _2 Recent Labs  Lab 11/08/18 1622  PROT 5.4*  ALBUMIN 2.6*  AST 20  ALT 13  ALKPHOS 44  BILITOT 1.3*   Hematology Recent Labs  Lab 11/09/18 2116 11/10/18 0253 11/10/18 1004  WBC 9.2 8.4 7.9  RBC 2.48* 2.42* 3.29*  HGB 8.1* 7.8* 10.6*  HCT 24.0* 23.6* 31.8*  MCV 96.8 97.5 96.7  MCH 32.7 32.2 32.2  MCHC 33.8 33.1 33.3  RDW 14.4 14.2 14.2  PLT 145* 146* 181   Cardiac EnzymesNo results for input(s): TROPONINI in the last 168 hours.  Recent Labs  Lab 11/08/18 1627  TROPIPOC 0.03    BNPNo results for input(s): BNP, PROBNP in the last 168 hours.  DDimer No results for input(s): DDIMER in the last 168 hours.  Radiology/Studies:  Ct Head Wo Contrast  Result Date: 11/09/2018 CLINICAL DATA:  Headache.  Unwitnessed fall.  Anti coagulation. EXAM: CT HEAD WITHOUT CONTRAST TECHNIQUE: Contiguous axial images were obtained from the base of the skull through the vertex without intravenous contrast. COMPARISON:  None. FINDINGS: Brain: 9 mm in diameter hypodense left thalamic lesion. 8 mm in diameter hypodense lesion of the lower portion of  the right internal capsule. Both of these are probably age indeterminate lacunar infarcts. Periventricular white matter and corona radiata hypodensities favor chronic ischemic microvascular white matter disease. No intracranial hemorrhage or mass lesion identified. No other significant findings. Vascular: Vertebrobasilar atherosclerosis. There is atherosclerotic calcification of the cavernous carotid arteries bilaterally. Skull: Unremarkable Sinuses/Orbits: Chronic ethmoid and left maxillary sinusitis. Other: No supplemental non-categorized findings. IMPRESSION: 1. Hypodensities favoring age-indeterminate lacunar infarcts in the left thalamus and in the right internal capsule. 2. Periventricular white matter and corona radiata hypodensities favor chronic ischemic microvascular white matter disease. 3. No acute intracranial hemorrhage. 4. Chronic ethmoid and left maxillary sinusitis. 5. Atherosclerosis. Electronically Signed   By: Van Clines M.D.   On: 11/09/2018 12:32   Ct Pelvis Wo Contrast  Result Date: 11/08/2018 CLINICAL DATA:  Severe right hip pain after multiple falls. EXAM: CT PELVIS WITHOUT CONTRAST TECHNIQUE: Multidetector CT imaging of the pelvis was performed following the standard protocol without intravenous contrast. COMPARISON:  Right hip x-rays from same day. FINDINGS: Urinary Tract:  No abnormality visualized. Bowel:  Unremarkable visualized pelvic bowel loops. Vascular/Lymphatic: Aortoiliac atherosclerotic vascular disease. No pathologically enlarged lymph nodes.  Reproductive:  Mild prostatomegaly. Other:  Nonspecific presacral stranding. Musculoskeletal: Ill-defined hyperdensity within and between the right gluteus maximus and medius muscles extending inferiorly into the posterior upper thigh with mass effect and medial displacement of the sciatic nerve, consistent with hematoma. The hematoma is difficult to measure, but spans a craniocaudal length of approximately 20.6 cm. Superficial  soft tissue edema in the lateral right hip. No acute fracture or dislocation. Mild bilateral hip joint space narrowing. Severe degenerative disc disease at L5-S1. IMPRESSION: 1. Ill-defined large right gluteal hematoma extending inferiorly into the posterior upper thigh with mass effect and medial displacement the sciatic nerve. 2.  No acute osseous abnormality. 3.  Aortic atherosclerosis (ICD10-I70.0). Electronically Signed   By: Titus Dubin M.D.   On: 11/08/2018 10:48   Dg Chest Port 1 View  Result Date: 11/08/2018 CLINICAL DATA:  RIGHT femur pain, fell on Monday EXAM: PORTABLE CHEST 1 VIEW COMPARISON:  Portable exam 1630 hours compared to 11/05/2018 FINDINGS: Normal heart size post CABG. Atherosclerotic calcification aorta. Mediastinal contours and pulmonary vascularity normal. Lungs clear. No pulmonary infiltrate, pleural effusion or pneumothorax. IMPRESSION: No acute abnormalities. Electronically Signed   By: Lavonia Dana M.D.   On: 11/08/2018 16:46   Dg Hip Unilat W Or Wo Pelvis 2-3 Views Right  Result Date: 11/08/2018 CLINICAL DATA:  Right lateral hip pain after a recent fall. EXAM: DG HIP (WITH OR WITHOUT PELVIS) 2-3V RIGHT COMPARISON:  11/05/2018 FINDINGS: No acute fracture or hip dislocation is identified. Hip joint space widths are preserved with at most mild acetabular spurring. Atherosclerotic vascular calcifications are noted. IMPRESSION: No acute osseous abnormality identified. Electronically Signed   By: Logan Bores M.D.   On: 11/08/2018 09:11   Dg Femur Portable 1 View Right  Result Date: 11/08/2018 CLINICAL DATA:  Fall EXAM: RIGHT FEMUR PORTABLE 1 VIEW COMPARISON:  Portable AP exam at 1638 hours without priors for comparison FINDINGS: Osseous mineralization normal. Knee and hip joint spaces preserved. No acute fracture, dislocation, or bone destruction. Scattered atherosclerotic calcifications. IMPRESSION: No acute osseous abnormalities. Electronically Signed   By: Lavonia Dana M.D.    On: 11/08/2018 16:47    Assessment and Plan:   1.  Persistent atrial fibrillation: Currently on dofetilide 125 mcg twice daily. Renal function is normal. If he has frequent recurrences, I would consider increasing to 250 mcg bid. He had been on Lopressor 25 mg bid PTA which I will reorder. K is normal. Mg ordered and pending. As an echo was done at primary cardiologist's office 6 months ago, I will not repeat (I have requested records). Currently in sinus with PAC's. Likely triggered by anemia. Hold apixaban for now until Hgb stabilizes and he no longer requires transfusions.  2.  Hypertension: Blood pressures currently 154/74. I will start Lopressor 25 mg bid. I will also resume lisinopril 5 mg daily.  3.  Chronic systolic heart failure: He appears euvolemic.  Beta-blockers and ACE inhibitors have been held by primary team.  As an echo was done at primary cardiologist's office 6 months ago, I will not repeat (I have requested records). He was told that cardiac function had improved. I will resume both Lopressor and lisinopril as he is hypertensive.  4.  Symptomatic anemia: Hemoglobin back up to 10.6 after transfusion.  5.  Coronary disease with history of four-vessel CABG: Symptomatically stable.  Currently off of aspirin due to symptomatic anemia.  He is on rosuvastatin.  Metoprolol has been held. I will  resume both Lopressor and  lisinopril as he is hypertensive.    For questions or updates, please contact [place] Please consult [email or web address] for contact info under     Signed, Kate Sable, MD  11/10/2018 1:37 PM

## 2018-11-10 NOTE — Evaluation (Signed)
Occupational Therapy Evaluation Patient Details Name: Edgar Thomas MRN: [id number] DOB: 12-29-1929 Today's Date: 11/10/2018    History of Present Illness 85 year old male with a history of atrial fibrillation, coronary artery disease status post CABG, heart failure who presents with a right gluteal hematoma. Presents with related anemia and syncope in setting of anemia.   Clinical Impression   Pt admitted with right gluteal hematoma. Pt currently with functional limitations due to the deficits listed below (see OT Problem List). The patient requires in crease time to complete activity due to pain in RLE. The patient was requires min-moderate level of assist with LE ADLs. Pt requires further education for AE/DM for the home. Pt will benefit from skilled OT to increase their safety and independence with ADL and functional mobility for ADL to facilitate discharge to venue listed below.       Follow Up Recommendations  Home health OT;Supervision/Assistance - 24 hour    Equipment Recommendations  3 in 1 bedside commode    Recommendations for Other Services       Precautions / Restrictions Precautions Precautions: Fall Restrictions Weight Bearing Restrictions: No      Mobility Bed Mobility Overal bed mobility: Needs Assistance Bed Mobility: Supine to Sit     Supine to sit: Supervision     General bed mobility comments: increase time due to pain/guarding  Transfers Overall transfer level: Needs assistance Equipment used: Rolling walker (2 wheeled) Transfers: Sit to/from Stand Sit to Stand: Modified independent (Device/Increase time)         General transfer comment: increase height     Balance                                           ADL either performed or assessed with clinical judgement   ADL Overall ADL's : Needs assistance/impaired Eating/Feeding: Set up;Sitting   Grooming: Wash/dry hands;Sitting;Min guard;Cueing for compensatory  techniques   Upper Body Bathing: Min guard;Cueing for safety;Sitting   Lower Body Bathing: Moderate assistance;Cueing for safety;Cueing for sequencing;Sit to/from stand   Upper Body Dressing : Min guard;Sitting;Cueing for safety   Lower Body Dressing: Moderate assistance;Cueing for safety;Sit to/from stand   Toilet Transfer: Min guard;Comfort height toilet   Toileting- Water quality scientist and Hygiene: Min guard;Cueing for safety;Cueing for sequencing;Sit to/from stand   Tub/ Shower Transfer: Minimal assistance;Cueing for safety;Cueing for sequencing   Functional mobility during ADLs: Min guard;Cueing for safety;Cueing for sequencing;Rolling walker       Vision Baseline Vision/History: No visual deficits Patient Visual Report: No change from baseline Vision Assessment?: No apparent visual deficits     Perception Perception Perception Tested?: No   Praxis Praxis Praxis tested?: Within functional limits    Pertinent Vitals/Pain Pain Assessment: Faces Faces Pain Scale: Hurts a little bit Pain Location: R LE Pain Descriptors / Indicators: Discomfort;Guarding Pain Intervention(s): Limited activity within patient's tolerance;Monitored during session;Repositioned     Hand Dominance Right   Extremity/Trunk Assessment Upper Extremity Assessment Upper Extremity Assessment: Overall WFL for tasks assessed   Lower Extremity Assessment Lower Extremity Assessment: Generalized weakness   Cervical / Trunk Assessment Cervical / Trunk Assessment: Kyphotic   Communication Communication Communication: No difficulties   Cognition Arousal/Alertness: Awake/alert Behavior During Therapy: WFL for tasks assessed/performed Overall Cognitive Status: Within Functional Limits for tasks assessed  General Comments       Exercises     Shoulder Instructions      Home Living Family/patient expects to be discharged to:: Private  residence Living Arrangements: Spouse/significant other Available Help at Discharge: Family Type of Home: House             Bathroom Shower/Tub: Walk-in shower(with 2-3 in step in)   Bathroom Toilet: Standard Bathroom Accessibility: Yes How Accessible: Accessible via walker Home Equipment: Walker - 2 wheels   Additional Comments: self report possible shower seat at home      Prior Functioning/Environment Level of Independence: Independent                 OT Problem List: Decreased strength;Decreased activity tolerance;Impaired balance (sitting and/or standing);Decreased safety awareness;Decreased knowledge of use of DME or AE;Pain      OT Treatment/Interventions: Self-care/ADL training;DME and/or AE instruction;Therapeutic activities;Patient/family education;Balance training    OT Goals(Current goals can be found in the care plan section) Acute Rehab OT Goals Patient Stated Goal: to go home OT Goal Formulation: With patient Time For Goal Achievement: 11/10/18 Potential to Achieve Goals: Good ADL Goals Pt Will Perform Lower Body Bathing: with min guard assist;with adaptive equipment;sit to/from stand Pt Will Transfer to Toilet: with modified independence;ambulating;regular height toilet Pt Will Perform Tub/Shower Transfer: Shower transfer;with supervision;grab bars  OT Frequency: Min 2X/week   Barriers to D/C:            Co-evaluation PT/OT/SLP Co-Evaluation/Treatment: Yes Reason for Co-Treatment: (nurse request/pain) PT goals addressed during session: Mobility/safety with mobility OT goals addressed during session: ADL's and self-care      AM-PAC OT "6 Clicks" Daily Activity     Outcome Measure Help from another person eating meals?: None Help from another person taking care of personal grooming?: A Little Help from another person toileting, which includes using toliet, bedpan, or urinal?: A Little Help from another person bathing (including washing,  rinsing, drying)?: A Lot Help from another person to put on and taking off regular upper body clothing?: A Little Help from another person to put on and taking off regular lower body clothing?: A Lot 6 Click Score: 17   End of Session Equipment Utilized During Treatment: Rolling walker  Activity Tolerance: Patient tolerated treatment well Patient left: in chair;with call bell/phone within reach;with chair alarm set  OT Visit Diagnosis: Unsteadiness on feet (R26.81);Other abnormalities of gait and mobility (R26.89);Repeated falls (R29.6);Pain Pain - Right/Left: Right Pain - part of body: (LE)                Time: [id number] OT Time Calculation (min): 21 min Charges:  OT General Charges $OT Visit: 1 Visit OT Evaluation $OT Eval Low Complexity: 1 Low  Alphia MohKira Azalyn Sliwa OTR/L  Acute Rehab Services  [phone] office number [phone] pager number   Alphia MohKira  Ceylin Dreibelbis 11/10/2018, 10:10 AM

## 2018-11-10 NOTE — Evaluation (Signed)
Physical Therapy Evaluation Patient Details Name: Edgar Thomas MRN: [id number] DOB: 1930-04-29 Today's Date: 11/10/2018   History of Present Illness  85 year old male with a history of atrial fibrillation, coronary artery disease status post CABG, heart failure who presents with a right gluteal hematoma. Presents with related anemia and syncope in setting of anemia.  Clinical Impression  Orders received for PT evaluation. Patient demonstrates deficits in functional mobility as indicated below. Will benefit from continued skilled PT to address deficits and maximize function. Will see as indicated and progress as tolerated.  Anticipate patient will progress well, recommend HHPT follow up upon discharge.    Follow Up Recommendations Home health PT;Supervision for mobility/OOB    Equipment Recommendations  Rolling walker with 5" wheels(thinks he has one at home but unsure)    Recommendations for Other Services       Precautions / Restrictions Precautions Precautions: Fall Restrictions Weight Bearing Restrictions: No      Mobility  Bed Mobility Overal bed mobility: Needs Assistance Bed Mobility: Supine to Sit     Supine to sit: Supervision     General bed mobility comments: increase time due to pain/guarding  Transfers Overall transfer level: Needs assistance Equipment used: Rolling walker (2 wheeled) Transfers: Sit to/from Stand Sit to Stand: Modified independent (Device/Increase time)         General transfer comment: increase height   Ambulation/Gait Ambulation/Gait assistance: Min guard Gait Distance (Feet): 18 Feet Assistive device: Rolling walker (2 wheeled) Gait Pattern/deviations: Step-through pattern;Antalgic Gait velocity: decreased Gait velocity interpretation: <1.31 ft/sec, indicative of household ambulator General Gait Details: slow guarded gait, reliance on RW for upright support. Modest deviations due to Le pain  Stairs            Wheelchair  Mobility    Modified Rankin (Stroke Patients Only)       Balance                                             Pertinent Vitals/Pain Pain Assessment: Faces Faces Pain Scale: Hurts a little bit Pain Location: R LE Pain Descriptors / Indicators: Discomfort;Guarding Pain Intervention(s): Limited activity within patient's tolerance;Monitored during session;Repositioned    Home Living Family/patient expects to be discharged to:: Private residence Living Arrangements: Spouse/significant other Available Help at Discharge: Family Type of Home: House         Home Equipment: Environmental consultant - 2 wheels Additional Comments: self report possible shower seat at home    Prior Function Level of Independence: Independent               Hand Dominance   Dominant Hand: Right    Extremity/Trunk Assessment   Upper Extremity Assessment Upper Extremity Assessment: Overall WFL for tasks assessed    Lower Extremity Assessment Lower Extremity Assessment: Generalized weakness    Cervical / Trunk Assessment Cervical / Trunk Assessment: Kyphotic  Communication   Communication: No difficulties  Cognition Arousal/Alertness: Awake/alert Behavior During Therapy: WFL for tasks assessed/performed Overall Cognitive Status: Within Functional Limits for tasks assessed                                        General Comments      Exercises     Assessment/Plan    PT Assessment Patient needs continued PT  services  PT Problem List Decreased activity tolerance;Decreased balance;Decreased mobility;Pain       PT Treatment Interventions DME instruction;Gait training;Functional mobility training;Therapeutic exercise;Therapeutic activities;Balance training;Neuromuscular re-education;Patient/family education    PT Goals (Current goals can be found in the Care Plan section)  Acute Rehab PT Goals Patient Stated Goal: to go home PT Goal Formulation: With  patient Time For Goal Achievement: 11/24/18 Potential to Achieve Goals: Good    Frequency Min 3X/week   Barriers to discharge        Co-evaluation   Reason for Co-Treatment: (nurse request/pain) PT goals addressed during session: Mobility/safety with mobility OT goals addressed during session: ADL's and self-care       AM-PAC PT "6 Clicks" Mobility  Outcome Measure Help needed turning from your back to your side while in a flat bed without using bedrails?: None Help needed moving from lying on your back to sitting on the side of a flat bed without using bedrails?: A Little Help needed moving to and from a bed to a chair (including a wheelchair)?: A Little Help needed standing up from a chair using your arms (e.g., wheelchair or bedside chair)?: A Little Help needed to walk in hospital room?: A Little Help needed climbing 3-5 steps with a railing? : A Lot 6 Click Score: 18    End of Session   Activity Tolerance: Patient tolerated treatment well Patient left: in chair;with call bell/phone within reach;with chair alarm set Nurse Communication: Mobility status PT Visit Diagnosis: Unsteadiness on feet (R26.81);History of falling (Z91.81)    Time: [id number] PT Time Calculation (min) (ACUTE ONLY): 21 min   Charges:   PT Evaluation $PT Eval Moderate Complexity: 1 Mod          Charlotte Crumbevon Chistina Roston, PT DPT  Board Certified Neurologic Specialist Acute Rehabilitation Services Pager [phone] Office [phone]   Fabio AsaDevon J Tanylah Schnoebelen 11/10/2018, 8:39 AM

## 2018-11-11 ENCOUNTER — Other Ambulatory Visit: Payer: Self-pay | Admitting: Physician Assistant

## 2018-11-11 DIAGNOSIS — I2510 Atherosclerotic heart disease of native coronary artery without angina pectoris: Secondary | ICD-10-CM

## 2018-11-11 LAB — TYPE AND SCREEN
ABO/RH(D): O POS
Antibody Screen: NEGATIVE
Unit division: 0
Unit division: 0

## 2018-11-11 LAB — BASIC METABOLIC PANEL
Anion gap: 7 (ref 5–15)
BUN: 22 mg/dL (ref 8–23)
CO2: 22 mmol/L (ref 22–32)
Calcium: 8 mg/dL — ABNORMAL LOW (ref 8.9–10.3)
Chloride: 107 mmol/L (ref 98–111)
Creatinine, Ser: 0.71 mg/dL (ref 0.61–1.24)
GFR calc Af Amer: >60 mL/min (ref >60)
GFR calc non Af Amer: >60 mL/min (ref >60)
Glucose, Bld: 105 mg/dL — ABNORMAL HIGH (ref 70–99)
Potassium: 3.8 mmol/L (ref 3.5–5.1)
Sodium: 136 mmol/L (ref 135–145)

## 2018-11-11 LAB — CBC
HCT: 26.9 % — ABNORMAL LOW (ref 39.0–52.0)
HCT: 28.8 % — ABNORMAL LOW (ref 39.0–52.0)
Hemoglobin: 9 g/dL — ABNORMAL LOW (ref 13.0–17.0)
Hemoglobin: 9.4 g/dL — ABNORMAL LOW (ref 13.0–17.0)
MCH: 32 pg (ref 26.0–34.0)
MCH: 31.5 pg (ref 26.0–34.0)
MCHC: 33.5 g/dL (ref 30.0–36.0)
MCHC: 32.6 g/dL (ref 30.0–36.0)
MCV: 95.7 fL (ref 80.0–100.0)
MCV: 96.6 fL (ref 80.0–100.0)
Platelets: 163 10*3/uL (ref 150–400)
Platelets: 175 10*3/uL (ref 150–400)
RBC: 2.81 MIL/uL — ABNORMAL LOW (ref 4.22–5.81)
RBC: 2.98 MIL/uL — ABNORMAL LOW (ref 4.22–5.81)
RDW: 13.9 % (ref 11.5–15.5)
RDW: 13.9 % (ref 11.5–15.5)
WBC: 7.5 10*3/uL (ref 4.0–10.5)
WBC: 7 10*3/uL (ref 4.0–10.5)
nRBC: 0 % (ref 0.0–0.2)
nRBC: 0 % (ref 0.0–0.2)

## 2018-11-11 LAB — BPAM RBC
Blood Product Expiration Date: 202007152359
Blood Product Expiration Date: 202007162359
ISSUE DATE / TIME: 202006122355
ISSUE DATE / TIME: 202006140539
Unit Type and Rh: 5100
Unit Type and Rh: 5100

## 2018-11-11 LAB — LIPID PANEL
Cholesterol: 100 mg/dL (ref 0–200)
HDL: 35 mg/dL — ABNORMAL LOW (ref >40)
LDL Cholesterol: 54 mg/dL (ref 0–99)
Total CHOL/HDL Ratio: 2.9 RATIO
Triglycerides: 54 mg/dL (ref <150)
VLDL: 11 mg/dL (ref 0–40)

## 2018-11-11 LAB — MAGNESIUM: Magnesium: 1.8 mg/dL (ref 1.7–2.4)

## 2018-11-11 NOTE — Progress Notes (Addendum)
Progress Note  Patient Name: Edgar Thomas Date of Encounter: 11/11/2018  Primary Cardiologist: No primary care provider on file.   Subjective   Remains in NSR.  No complaints.  Hoping to go home tomorrow  Inpatient Medications    Scheduled Meds:  dofetilide  125 mcg Oral BID   gabapentin  100 mg Oral BID   lisinopril  5 mg Oral Daily   metoprolol tartrate  25 mg Oral BID   rosuvastatin  20 mg Oral q morning - 10a   Continuous Infusions:  PRN Meds: acetaminophen **OR** acetaminophen, nitroGLYCERIN, polyethylene glycol   Vital Signs    Vitals:   11/10/18 1646 11/10/18 2207 11/11/18 0526 11/11/18 0845  BP: (!) 173/84 (!) 155/83 (!) 155/76 126/67  Pulse: 82 70 70 80  Resp:  18 18   Temp:  98.1 F (36.7 C) 98.5 F (36.9 C) 98.6 F (37 C)  TempSrc:  Oral Oral Oral  SpO2:  97% 96% 97%  Weight:      Height:        Intake/Output Summary (Last 24 hours) at 11/11/2018 0851 Last data filed at 11/10/2018 1200 Gross per 24 hour  Intake 120 ml  Output 200 ml  Net -80 ml   Filed Weights   11/08/18 1552 11/08/18 2134 11/09/18 0603  Weight: 72.6 kg 74.7 kg 74.7 kg    Telemetry    NSR with atrial triplets.  3 beats WCT likely PACs with aberration - Personally Reviewed  ECG    No new EKG to review - Personally Reviewed  Physical Exam   GEN: No acute distress.   Neck: No JVD Cardiac: RRR, no murmurs, rubs, or gallops.  Respiratory: Clear to auscultation bilaterally. GI: Soft, nontender, non-distended  MS: No edema; No deformity. Neuro:  Nonfocal  Psych: Normal affect   Labs    Chemistry Recent Labs  Lab 11/08/18 1622 11/09/18 0531 11/10/18 0253 11/11/18 0232  NA 137 137 137 136  K 4.1 3.7 3.8 3.8  CL 107 109 109 107  CO2 24 23 23 22   GLUCOSE 138* 107* 106* 105*  BUN 22 21 22 22   CREATININE 0.91 0.74 0.79 0.71  CALCIUM 8.1* 8.0* 7.9* 8.0*  PROT 5.4*  --   --   --   ALBUMIN 2.6*  --   --   --   AST 20  --   --   --   ALT 13  --   --   --     ALKPHOS 44  --   --   --   BILITOT 1.3*  --   --   --   GFRNONAA >60 >60 >60 >60  GFRAA >60 >60 >60 >60  ANIONGAP 6 5 5 7      Hematology Recent Labs  Lab 11/10/18 1516 11/10/18 2026 11/11/18 0232  WBC 8.1 7.3 7.5  RBC 2.93* 3.05* 2.81*  HGB 9.3* 9.7* 9.0*  HCT 28.2* 29.4* 26.9*  MCV 96.2 96.4 95.7  MCH 31.7 31.8 32.0  MCHC 33.0 33.0 33.5  RDW 14.1 14.1 13.9  PLT 157 169 163    Cardiac EnzymesNo results for input(s): TROPONINI in the last 168 hours.  Recent Labs  Lab 11/08/18 1627  TROPIPOC 0.03     BNPNo results for input(s): BNP, PROBNP in the last 168 hours.   DDimer No results for input(s): DDIMER in the last 168 hours.   Radiology    Ct Head Wo Contrast  Result Date: 11/09/2018 CLINICAL DATA:  Headache.  Unwitnessed fall.  Anti coagulation. EXAM: CT HEAD WITHOUT CONTRAST TECHNIQUE: Contiguous axial images were obtained from the base of the skull through the vertex without intravenous contrast. COMPARISON:  None. FINDINGS: Brain: 9 mm in diameter hypodense left thalamic lesion. 8 mm in diameter hypodense lesion of the lower portion of the right internal capsule. Both of these are probably age indeterminate lacunar infarcts. Periventricular white matter and corona radiata hypodensities favor chronic ischemic microvascular white matter disease. No intracranial hemorrhage or mass lesion identified. No other significant findings. Vascular: Vertebrobasilar atherosclerosis. There is atherosclerotic calcification of the cavernous carotid arteries bilaterally. Skull: Unremarkable Sinuses/Orbits: Chronic ethmoid and left maxillary sinusitis. Other: No supplemental non-categorized findings. IMPRESSION: 1. Hypodensities favoring age-indeterminate lacunar infarcts in the left thalamus and in the right internal capsule. 2. Periventricular white matter and corona radiata hypodensities favor chronic ischemic microvascular white matter disease. 3. No acute intracranial hemorrhage. 4.  Chronic ethmoid and left maxillary sinusitis. 5. Atherosclerosis. Electronically Signed   By: Gaylyn RongWalter  Liebkemann M.D.   On: 11/09/2018 12:32    Cardiac Studies   2D echo 2015 Study Conclusions   - Left ventricle: Systolic function was mildly to moderately  reduced. The estimated ejection fraction was in the range of 40%  to 45%.  - Aortic valve: No evidence of vegetation. There was mild  regurgitation.  - Mitral valve: There was mild regurgitation.  - Left atrium: No evidence of thrombus in the appendage.  - Atrial septum: No defect or patent foramen ovale was identified.  Echo contrast study showed no right-to-left atrial level shunt,  following an increase in RA pressure induced by provocative  maneuvers.  - Tricuspid valve: No evidence of vegetation.   Patient Profile     85 y.o. male with a hx of CABG and PAF who is being seen today for the evaluation of atrial fibrillation at the request of Dr. Artist PaisYoo.  Assessment & Plan    1. Persistent atrial fibrillation -currently in NSR with occasional atrial triplets.  3 beat run of WCT suspect abberration -currently on Dofetilide 125mcg BID -given normal renal function, consider increasing dofetilide to 250mcg BID - will get 30 day outpt monitor to assess afib burden and followup in afib clinc. -continue on Lopressor 25mg  BID -afib likely triggered by acute anemia -restart apixaban once Hbg stabilizes and no further need for transfusions  2.  Hypertension -BP is controlled after starting lopressor and ACE at 126/1167mmHg -creatinine stable at 0.71 this am -continue Lopressor 25mg  BID and Lisinopril 5mg  daily  3.  Chronic systolic CHF -he does not appear volume overloaded on exam. -awaiting records of prior echo at Cardiologist office -continue BB and ACE I -restart home does of Lasix 20mg  daily  4.  Symptomatic anemia secondary to right hip hematoma -S/p transfusion -Hbg down to 9 this am from 9.7 yesterday  5.   ASCAD -s/p CABG x 4 -denies any anginal sx -ASA on hold due to anemia -continue statin and BB  6.  Hyperlipidemia -LDL is at goal at 54 -continue Crestor 20mg  daily  No further inpt recs at this time.  Restart Apixaban when ok with GI.  Continue Tikosyn, BB, ACE I and lasix.    [facility] will sign off.   Medication Recommendations:  Tikosyn 125mcg BID, Lopressor 25mg  BID, Lisinopril 5mg  daily, Lasix 20mg  daily.  Restart Apixaban when ok with GI Other recommendations (labs, testing, etc):  None Follow up as an outpatient:  followup with primary Cardiologist in next  10 days  For questions or updates, please contact [facility] Please consult [email or web address] for contact info under Cardiology/STEMI.      Signed, Armanda Magicraci Camyla Camposano, MD  11/11/2018, 8:51 AM

## 2018-11-11 NOTE — Plan of Care (Signed)

## 2018-11-11 NOTE — Significant Event (Signed)
For 6/15 1900 to 6/16 at 0700, please page [phone].  Pager [phone] is not working.  [place] Constable, D.O.  PGY-1 Family Medicine  11/11/2018 10:04 PM

## 2018-11-11 NOTE — Progress Notes (Addendum)
Family Medicine Teaching Service Daily Progress Note Intern Pager: [phone]  Patient name: Edgar Thomas Medical record number: [id number] Date of birth: 03/29/1930 Age: 85 y.o. Gender: male  Primary Care Provider: Simone CuriaLee, Keung, MD Consultants: none Code Status: full  Pt Overview and Major Events to Date:  6/13 admitted with syncope from R hip hematoma  Assessment and Plan: Edgar Thomas is a 85 y.o. male presenting with syncope and symptomatic anemia.  His past medical history is significant for CAD s/p CABG x4 (11/2013), A. fib on Eliquis, HFrEF, gait disturbance.  Anemia 2/2 R hip hematoma. In the setting of fall on anticoagulation. Hgb 7.9>s/p 1U RBC>8.5>8.1>7.8.10.6>9.3>9.7>9.0 his hemoglobin continues to drift downward. -Per ortho, no indication for surgical intervention. Could consider reversal with FFP if Hgb drops further. Following. Appreciate additional recommendations -Transfuse for hemoglobin under 8 -Holding apixaban and aspirin -CBC every 12 hours -Telemetry -continue iron supplementation -PT - HH -OT - HH  Syncope Likely secondary to symptomatic anemia.  Positive orthostatic vitals on 6/14. -Reattempt orthostatic vitals with stable hemoglobin -continue to hold lasix due to orthostatic hypotension -will consider holding metoprolol and lisinopril due to orthostatic hypotension   A. Fib -Holding Eliquis as per above -Continue dofetilide -metolprolol 25 mg BID  HFrEF, chronic. Not in exacerbation.  -Hold furosemide - lisinopril -metoprolol as above -Monitor fluid status, giving IV lasix with transfusion this morning  Hx CAD s/p CABG x4 (2015) -Crestor 20 mg -Nitroglycerin SL prn -hold ASA  Chronic leg pain -Continue gabapentin 100 mg twice daily  FEN/GI:Heart healthy, miralax prn Prophylaxis:SCDs  Disposition: 1-2 additional days of hospitalization anticipated before discharge  Subjective:  No acute events overnight.  He reports continued right  hip pain especially following some physical therapy yesterday.  He reports that he is able to use his leg more following physical therapy.  Objective: Temp:  [98 F (36.7 C)-98.5 F (36.9 C)] 98.5 F (36.9 C) (06/15 0526) Pulse Rate:  [70-83] 70 (06/15 0526) Resp:  [16-18] 18 (06/15 0526) BP: (154-173)/(74-84) 155/76 (06/15 0526) SpO2:  [96 %-98 %] 96 % (06/15 0526) Physical Exam: General: Alert and cooperative and appears to be in no acute distress.  Lying in bed comfortably eating breakfast. Cardio: Normal S1 and S2, no S3 or S4. Rhythm is irregularly irregular.  Pulm: Clear to auscultation bilaterally, no crackles, wheezing, or diminished breath sounds. Normal respiratory effort Abdomen: Bowel sounds normal. Abdomen soft and non-tender.  Extremities: No peripheral edema. Warm/ well perfused.  Strong radial pulse. Neuro: Cranial nerves grossly intact   Laboratory: Recent Labs  Lab 11/10/18 1516 11/10/18 2026 11/11/18 0232  WBC 8.1 7.3 7.5  HGB 9.3* 9.7* 9.0*  HCT 28.2* 29.4* 26.9*  PLT 157 169 163   Recent Labs  Lab 11/08/18 1622 11/09/18 0531 11/10/18 0253 11/11/18 0232  NA 137 137 137 136  K 4.1 3.7 3.8 3.8  CL 107 109 109 107  CO2 24 23 23 22   BUN 22 21 22 22   CREATININE 0.91 0.74 0.79 0.71  CALCIUM 8.1* 8.0* 7.9* 8.0*  PROT 5.4*  --   --   --   BILITOT 1.3*  --   --   --   ALKPHOS 44  --   --   --   ALT 13  --   --   --   AST 20  --   --   --   GLUCOSE 138* 107* 106* 105*     Imaging/Diagnostic Tests: No results found.  Mirian MoFrank, Boris Engelmann, MD  11/11/2018, 6:22 AM PGY-3, [place] Intern pager: [phone], text pages welcome

## 2018-11-11 NOTE — Progress Notes (Signed)
Occupational Therapy Treatment Patient Details Name: Edgar Thomas MRN: [id number] DOB: 03-05-1930 Today's Date: 11/11/2018    History of present illness 85 year old male with a history of atrial fibrillation, coronary artery disease status post CABG, heart failure who presents with a right gluteal hematoma. Presents with related anemia and syncope in setting of anemia.   OT comments  Pt progressing towards OT goals this session. In recliner upon arrival. Able to complete initial education for AE kit (reacher/grabber, long handle shoe horn, long handle sponge, sock donner) and DME (3in 1 and uses) able to perform toilet transfer with RW at min guard into bathroom, peri care at min guard, and sink level grooming at min guard standing. Pt then returned to bed. Current POC remains appropriate.    Follow Up Recommendations  Home health OT;Supervision/Assistance - 24 hour    Equipment Recommendations  3 in 1 bedside commode    Recommendations for Other Services      Precautions / Restrictions Precautions Precautions: Fall Restrictions Weight Bearing Restrictions: No       Mobility Bed Mobility Overal bed mobility: Needs Assistance Bed Mobility: Sit to Supine       Sit to supine: Supervision   General bed mobility comments: increase time/effort, use of bed rails.  Transfers Overall transfer level: Needs assistance Equipment used: Rolling walker (2 wheeled) Transfers: Sit to/from Stand Sit to Stand: Supervision         General transfer comment: good hand placement from recliner with RW    Balance Overall balance assessment: Needs assistance Sitting-balance support: No upper extremity supported;Feet supported Sitting balance-Leahy Scale: Fair Sitting balance - Comments: sitting EOB without assist   Standing balance support: Bilateral upper extremity supported Standing balance-Leahy Scale: Poor Standing balance comment: dependent on BUE in standing                           ADL either performed or assessed with clinical judgement   ADL Overall ADL's : Needs assistance/impaired             Lower Body Bathing: Sit to/from stand;Set up;Cueing for compensatory techniques Lower Body Bathing Details (indicate cue type and reason): educated on use of long handle sponge,and importance of sitting for bathing     Lower Body Dressing: Sit to/from stand;Minimal assistance;With adaptive equipment;Cueing for sequencing;Cueing for compensatory techniques Lower Body Dressing Details (indicate cue type and reason): educated in grabber/reacher, long handle shoe horn, sock donner Toilet Transfer: Min guard;Comfort height toilet;Ambulation;RW   Toileting- Water quality scientist and Hygiene: Min guard;Sit to/from stand Toileting - Clothing Manipulation Details (indicate cue type and reason): use of grab bars     Functional mobility during ADLs: Min guard;Rolling walker General ADL Comments: focus on AE/DME (3in1) education     Vision   Vision Assessment?: No apparent visual deficits   Perception     Praxis      Cognition Arousal/Alertness: Awake/alert Behavior During Therapy: WFL for tasks assessed/performed Overall Cognitive Status: Within Functional Limits for tasks assessed                                          Exercises     Shoulder Instructions       General Comments      Pertinent Vitals/ Pain       Pain Assessment: Faces Faces Pain Scale: Hurts a little  bit Pain Location: R LE Pain Descriptors / Indicators: Discomfort;Guarding;Sore Pain Intervention(s): Limited activity within patient's tolerance;Monitored during session;Repositioned  Home Living                                          Prior Functioning/Environment              Frequency  Min 2X/week        Progress Toward Goals  OT Goals(current goals can now be found in the care plan section)  Progress towards OT goals:  Progressing toward goals  Acute Rehab OT Goals Patient Stated Goal: hopeful for home tomorrow OT Goal Formulation: With patient Time For Goal Achievement: 11/10/18 Potential to Achieve Goals: Good  Plan Discharge plan remains appropriate;Frequency remains appropriate    Co-evaluation                 AM-PAC OT "6 Clicks" Daily Activity     Outcome Measure   Help from another person eating meals?: None Help from another person taking care of personal grooming?: A Little Help from another person toileting, which includes using toliet, bedpan, or urinal?: A Little Help from another person bathing (including washing, rinsing, drying)?: A Lot Help from another person to put on and taking off regular upper body clothing?: A Little Help from another person to put on and taking off regular lower body clothing?: A Lot 6 Click Score: 17    End of Session Equipment Utilized During Treatment: Rolling walker;Gait belt  OT Visit Diagnosis: Unsteadiness on feet (R26.81);Other abnormalities of gait and mobility (R26.89);Repeated falls (R29.6);Pain Pain - Right/Left: Right Pain - part of body: Leg   Activity Tolerance Patient tolerated treatment well   Patient Left with call bell/phone within reach;in bed   Nurse Communication Mobility status        Time: 4008-6761 OT Time Calculation (min): 28 min  Charges: OT General Charges $OT Visit: 1 Visit OT Treatments $Self Care/Home Management : 23-37 mins  Hulda Humphrey OTR/L Acute Rehabilitation Services Pager: [phone] Office: [place] 11/11/2018, 2:10 PM

## 2018-11-11 NOTE — Progress Notes (Signed)
1100: Spoke with wife [name]. Updated on condition.

## 2018-11-12 DIAGNOSIS — I4891 Unspecified atrial fibrillation: Secondary | ICD-10-CM

## 2018-11-12 LAB — CBC
HCT: 28.9 % — ABNORMAL LOW (ref 39.0–52.0)
Hemoglobin: 9.6 g/dL — ABNORMAL LOW (ref 13.0–17.0)
MCH: 31.9 pg (ref 26.0–34.0)
MCHC: 33.2 g/dL (ref 30.0–36.0)
MCV: 96 fL (ref 80.0–100.0)
Platelets: 193 10*3/uL (ref 150–400)
RBC: 3.01 MIL/uL — ABNORMAL LOW (ref 4.22–5.81)
RDW: 13.7 % (ref 11.5–15.5)
WBC: 7.7 10*3/uL (ref 4.0–10.5)
nRBC: 0 % (ref 0.0–0.2)

## 2018-11-12 LAB — BASIC METABOLIC PANEL
Anion gap: 6 (ref 5–15)
BUN: 20 mg/dL (ref 8–23)
CO2: 23 mmol/L (ref 22–32)
Calcium: 8.2 mg/dL — ABNORMAL LOW (ref 8.9–10.3)
Chloride: 108 mmol/L (ref 98–111)
Creatinine, Ser: 0.7 mg/dL (ref 0.61–1.24)
GFR calc Af Amer: >60 mL/min (ref >60)
GFR calc non Af Amer: >60 mL/min (ref >60)
Glucose, Bld: 108 mg/dL — ABNORMAL HIGH (ref 70–99)
Potassium: 4.1 mmol/L (ref 3.5–5.1)
Sodium: 137 mmol/L (ref 135–145)

## 2018-11-12 MED ORDER — METOPROLOL TARTRATE 25 MG PO TABS: 12.5000 mg | ORAL_TABLET | Freq: Two times a day (BID) | ORAL | 0 refills | Status: DC

## 2018-11-12 NOTE — TOC Transition Note (Signed)
Transition of Care [facility]) - CM/SW Discharge Note   Patient Details  Name: Edgar Thomas MRN: [id number] Date of Birth: Nov 28, 1929  [facility] CM/SW Contact:  Sharin Mons, RN Phone Number: 11/12/2018, 1:11 PM   Clinical Narrative:    Admitted with syncope from R hematoma. Hx of  CAD s/p CABG x4 (11/2013), A. fib on Eliquis, HFrEF, gait disturbance. From home with wife. States requires minimum assistance with ADL's ( wife and son will assist if needed), and has rolling walker @ home.  Pt declined home health services. Wife to provide transportation to home. Pt states no concerns affording Rx meds.  Final next level of care: Home/Self Care(Pt declined home health services. States he has his own, wife and son.)     Patient Goals and CMS Choice Patient states their goals for this hospitalization and ongoing recovery are:: I will do better once i'm home CMS Medicare.gov Compare Post Acute Care list provided to:: Patient Choice offered to / list presented to : NA(pt declined home health services)  Discharge Placement                       Discharge Plan and Services                                     Social Determinants of Health (SDOH) Interventions     Readmission Risk Interventions No flowsheet data found.

## 2018-11-12 NOTE — Discharge Instructions (Signed)
You were admitted to the hospital because you were found to have very low blood levels because your bleeding into your right hip.  While you are here in the hospital, we stopped your blood thinning medication (aspirin and apixaban) and waited for your blood to appropriately clot.  While you are here, we found that you did have low blood pressures when moving from a seated to standing position and decided to hold some of your blood pressure medication.  He will be very important to follow-up with your physician before the weekend to discuss restarting your blood thinners and restarting some of your blood pressure medication.  Please take extra care with your physical activity going home.  He will be at higher risk of losing her balance or falling due to low blood pressures when standing.  Please also follow-up with your cardiologist.  Your cardiologist wants to make sure that you are not having additional heart trouble like to do a 30-day event monitor which they will start at their outpatient clinic.  Please be sure to visit your doctor if you have more trouble with falls at home or are concerned that you may have additional bleeding.

## 2018-11-12 NOTE — Progress Notes (Signed)
Physical Therapy Treatment Patient Details Name: Edgar Thomas MRN: [id number] DOB: 08/25/1929 Today's Date: 11/12/2018    History of Present Illness 85 year old male with a history of atrial fibrillation, coronary artery disease status post CABG, heart failure who presents with a right gluteal hematoma. Presents with related anemia and syncope in setting of anemia.    PT Comments    Patient seen for mobility progression. Pt tolerated gait distance of 100 ft with use of RW and supervision/min guard for safety. Pt with shuffling gait with increased distance due to fatigue/generalized weakness and increased pain. Pt will continue to benefit from further skilled PT services to maximize independence and safety with mobility.     Follow Up Recommendations  Home health PT;Supervision for mobility/OOB     Equipment Recommendations  Rolling walker with 5" wheels;Other (comment)(pt reports having RW at home but may need to double check)    Recommendations for Other Services       Precautions / Restrictions Precautions Precautions: Fall Restrictions Weight Bearing Restrictions: No    Mobility  Bed Mobility Overal bed mobility: Needs Assistance Bed Mobility: Supine to Sit       Sit to supine: Supervision   General bed mobility comments: increase time/effort, use of bed rails.  Transfers Overall transfer level: Needs assistance Equipment used: Rolling walker (2 wheeled) Transfers: Sit to/from Stand Sit to Stand: Supervision         General transfer comment: supervision for safety   Ambulation/Gait Ambulation/Gait assistance: Min guard;Supervision Gait Distance (Feet): 100 Feet Assistive device: Rolling walker (2 wheeled) Gait Pattern/deviations: Step-to pattern;Decreased stance time - right;Decreased step length - left;Decreased stride length;Decreased dorsiflexion - right;Decreased dorsiflexion - left;Shuffle;Antalgic;Decreased weight shift to right Gait velocity:  decreased   General Gait Details: initially supervision for safety with use of RW; with fatigue/increased pain pt reports feeling "winded" and with shuffling steps and further decreased cadence requiring min guard assist; SpO2 90% on RA and HR in 70s   Stairs         General stair comments: attempted to ambulate to stairs for training however pt needed to return to room for BM (did not have one though) so pt educated on sequencing and technique for comfort/safety on 3 steps to enter home; pt verbalized understanding and reports his son will assist him into home upon d/c    Wheelchair Mobility    Modified Rankin (Stroke Patients Only)       Balance Overall balance assessment: Needs assistance Sitting-balance support: No upper extremity supported;Feet supported Sitting balance-Leahy Scale: Good     Standing balance support: Bilateral upper extremity supported Standing balance-Leahy Scale: Fair Standing balance comment: bilat UE support for gait and able to static stand without UE support                             Cognition Arousal/Alertness: Awake/alert Behavior During Therapy: WFL for tasks assessed/performed Overall Cognitive Status: Within Functional Limits for tasks assessed                                        Exercises      General Comments        Pertinent Vitals/Pain Pain Assessment: Faces Faces Pain Scale: Hurts little more Pain Location: R LE Pain Descriptors / Indicators: Discomfort;Guarding;Sore Pain Intervention(s): Limited activity within patient's tolerance;Monitored during session;Repositioned  Home Living                      Prior Function            PT Goals (current goals can now be found in the care plan section) Acute Rehab PT Goals Patient Stated Goal: hopeful for home tomorrow Progress towards PT goals: Progressing toward goals    Frequency    Min 3X/week      PT Plan Current plan  remains appropriate    Co-evaluation              AM-PAC PT "6 Clicks" Mobility   Outcome Measure  Help needed turning from your back to your side while in a flat bed without using bedrails?: None Help needed moving from lying on your back to sitting on the side of a flat bed without using bedrails?: A Little Help needed moving to and from a bed to a chair (including a wheelchair)?: A Little Help needed standing up from a chair using your arms (e.g., wheelchair or bedside chair)?: A Little Help needed to walk in hospital room?: A Little Help needed climbing 3-5 steps with a railing? : A Lot 6 Click Score: 18    End of Session Equipment Utilized During Treatment: Gait belt Activity Tolerance: Patient tolerated treatment well Patient left: in chair;with call bell/phone within reach;with chair alarm set Nurse Communication: Mobility status PT Visit Diagnosis: Unsteadiness on feet (R26.81);History of falling (Z91.81)     Time: 6378-5885 PT Time Calculation (min) (ACUTE ONLY): 29 min  Charges:  $Gait Training: 23-37 mins                     Earney Navy, PTA Acute Rehabilitation Services Pager: [phone] Office: [phone]     Darliss Cheney 11/12/2018, 11:24 AM

## 2018-11-12 NOTE — Progress Notes (Signed)
Nsg Discharge Note  Admit Date:  11/08/2018 Discharge date: 11/12/2018   Abe People Greeson to be D/C'd Home per MD order.  AVS completed.  Copy for chart, and copy for patient signed, and dated. Patient/caregiver able to verbalize understanding.  Discharge Medication: Allergies as of 11/12/2018      Reactions   Contrast Media [iodinated Diagnostic Agents] Rash   19 years ago at [facility]   Metrizamide Rash   19 years ago at [facility] (Amipaque)   Other Other (See Comments)   Patient preference: NO MEAT OR DAIRY!!   Amiodarone Other (See Comments)   Makes the patient walk sideways and he falls   Ciprofloxacin Other (See Comments)   Was told by MD to "not take"   Tramadol Nausea And Vomiting      Medication List    STOP taking these medications   aspirin EC 81 MG tablet   Eliquis 5 MG Tabs tablet Generic drug: apixaban   lisinopril 5 MG tablet Commonly known as: ZESTRIL     TAKE these medications   benzonatate 100 MG capsule Commonly known as: TESSALON Take 1 capsule (100 mg total) by mouth 3 (three) times daily as needed for cough.   Crestor 20 MG tablet Generic drug: rosuvastatin Take 20 mg by mouth every morning. Notes to patient: 11/13/2018   dofetilide 125 MCG capsule Commonly known as: TIKOSYN Take 125 mcg by mouth 2 (two) times daily. Notes to patient: 11/12/2018 evening   furosemide 40 MG tablet Commonly known as: LASIX Take 20 mg by mouth every morning. Notes to patient: 11/13/2018   gabapentin 100 MG capsule Commonly known as: NEURONTIN Take 100 mg by mouth 2 (two) times a day. Morning and bedtime Notes to patient: 11/12/2018 evening   guaiFENesin 600 MG 12 hr tablet Commonly known as: MUCINEX Take 2 tablets (1,200 mg total) by mouth 2 (two) times daily. Notes to patient: 11/12/2018 evening   metoprolol tartrate 25 MG tablet Commonly known as: LOPRESSOR Take 0.5 tablets (12.5 mg total) by mouth 2 (two) times daily. What  changed:   medication strength  how much to take Notes to patient: 11/12/2018 evening   multivitamin-lutein Caps capsule Take 1 capsule by mouth daily with breakfast. Notes to patient: 11/13/2018   One-A-Day Mens 50+ Advantage Tabs Take 1 tablet by mouth daily with breakfast. Notes to patient: 11/13/2018   Neti Pot Sinus Wash 2300-700 MG Kit Generic drug: Sodium Chloride-Sodium Bicarb Place into the nose daily as needed (for congestion).   nitroGLYCERIN 0.4 MG SL tablet Commonly known as: NITROSTAT Place 0.4 mg under the tongue every 5 (five) minutes as needed for chest pain.   NON FORMULARY Place under the tongue See admin instructions. CBD oil: Place 2-3 droppersful sublingually 2 times a day Notes to patient: 11/12/2018 evening   Omega 3 1000 MG Caps Take 1,000 mg by mouth daily with breakfast. Notes to patient: 11/13/2018   polyethylene glycol 17 g packet Commonly known as: MIRALAX / GLYCOLAX Take 17 g by mouth daily as needed for moderate constipation.   potassium chloride SA 20 MEQ tablet Commonly known as: K-DUR Take 20 mEq by mouth every morning. Notes to patient: 11/13/2018   Vitamin D-3 25 MCG (1000 UT) Caps Take 1,000 Units by mouth daily after breakfast. Notes to patient: 11/13/2018       Discharge Assessment: Vitals:   11/12/18 0805 11/12/18 1217  BP: 118/90 134/80  Pulse: 77 62  Resp: 16 16  Temp: 98.2  F (36.8 C)   SpO2: 95% 99%   Skin clean, dry and intact without evidence of skin break down, no evidence of skin tears noted. IV catheter discontinued intact. Site without signs and symptoms of complications - no redness or edema noted at insertion site, patient denies c/o pain - only slight tenderness at site.  Dressing with slight pressure applied.  D/c Instructions-Education: Discharge instructions given to patient/family with verbalized understanding. D/c education completed with patient/family including follow up instructions, medication list,  d/c activities limitations if indicated, with other d/c instructions as indicated by MD - patient able to verbalize understanding, all questions fully answered. Patient instructed to return to ED, call 911, or call MD for any changes in condition.  Patient escorted via [place], and D/C home via private auto.  Tresa Endo, RN 11/12/2018 2:02 PM

## 2018-11-12 NOTE — Progress Notes (Signed)
Pt had 9 beat run Vtach. Asymptomatic on assessment, no distress, denied chest pain and SOB, mental status unchanged. BP 170/82, HR 71, Temp 99.1, SpO2 71, RR 16.  On call MD aware.

## 2018-11-12 NOTE — Discharge Summary (Signed)
[facility] Discharge Summary  Patient name: Edgar Thomas Medical record number: [id number] Date of birth: 01-10-1930 Age: 85 y.o. Gender: male Date of Admission: 11/08/2018  Date of Discharge: 11/12/2018 Admitting Physician: Martyn Malay, MD  Primary Care Provider: [name], MD Consultants: Cardiology  Indication for Hospitalization: Symptomatic anemia  Discharge Diagnoses/Problem List:  Anemia secondary to third spacing into right hip hematoma Syncope secondary to symptomatic anemia A. fib, poorly controlled likely secondary to anemia HFrEF, chronic History of CAD s/p CABG x4 Chronic leg pain  Disposition: Discharge home  Discharge Condition: Stable  Discharge Exam:  General: Alert and cooperative and appears to be in no acute distress.  Resting in bed comfortably eating breakfast. HEENT: Neck non-tender without lymphadenopathy, masses or thyromegaly Cardio: Normal S1 and S2, no S3 or S4.  Irregularly irregular rhythm.   Pulm: Clear to auscultation bilaterally, no crackles, wheezing, or diminished breath sounds. Normal respiratory effort Abdomen: Bowel sounds normal. Abdomen soft and non-tender.  Extremities: No peripheral edema. Warm/ well perfused.  Strong radial pulses. Neuro: Cranial nerves grossly intact  Brief Hospital Course:  Edgar Loflinis a 85 y.o.malepresenting with syncope and symptomatic anemia. His past medical history is significant for CAD s/p CABG x4 (11/2013), A. fib on Eliquis, HFrEF, gait disturbance.  Anemia secondary to third spacing to right hip [name] experienced a fall at home about 1 week prior to hospital admission.  As result of the fall and his chronic anticoagulation for A. fib, he experienced a significant bleed around his right hip.  At the time of his hospital admission, he was found to have a hemoglobin of 7.9 (decreased from 12.4 four days prior).  On admission, aspirin and Eliquis was held, 1 unit packed  RBCs given on Ortho was consulted.  Orthopedics recommended no intervention and preferred to monitor and provide physical therapy.  [name] was monitored over the next 4 days until he is able to demonstrate a stable hemoglobin for 24-hour period.  In the course of his hospitalization, he was administered 2 units packed RBCs due to a transfusion threshold of 8.  The day of discharge, his hemoglobin was 9.6 and had been stable for the past 24 hours.  He was also discharged on iron supplementation.  A. fib, chronic He has a history of A. fib which is medicated at home with Eliquis, metoprolol and dofetilide.  He was kept on telemetry during his hospital stay and showed several short runs of ventricular tachycardia.  Cardiology was consulted and recommended follow-up outpatient for a 30 Day Loop recorder.  During his hospital stay his dofetilide was also increased to 125 mg BID.  Syncope, secondary to anemia Admission, he reported a syncopal episode.  This was thought to be secondary to anemia.  During his hospital stay, his blood pressure remained within the normal range although orthostatic vitals showed orthostatic hypotension that had not resolved at the time of discharge.  He was given orders for home health PT and had the following medication changes at discharge: He sent home with half of his typical dose of metoprolol (12.5 mg twice daily) and his lisinopril was held on discharge.  Issues for Follow Up:  1. CBC to monitor hemoglobin and ensure his blood clot is stable.  Consider restarting Eliquis and waiting 1 week to restart aspirin. 2. BMP to monitor potassium.  Lasix was held during hospital stay and restarted on discharge. 3. Measure orthostatic vitals.  Consider restarting home medication of lisinopril 5  mg or increasing his metoprolol to 25 mg twice daily based on his orthostatic vitals.  Significant Procedures: none  Significant Labs and Imaging:  Recent Labs  Lab 11/11/18 0232  11/11/18 1430 11/12/18 0416  WBC 7.5 7.0 7.7  HGB 9.0* 9.4* 9.6*  HCT 26.9* 28.8* 28.9*  PLT 163 175 193   Recent Labs  Lab 11/08/18 1622 11/09/18 0531 11/10/18 0253 11/11/18 0232 11/12/18 0416  NA 137 137 137 136 137  K 4.1 3.7 3.8 3.8 4.1  CL 107 109 109 107 108  CO2 '24 23 23 22 23  ' GLUCOSE 138* 107* 106* 105* 108*  BUN '22 21 22 22 20  ' CREATININE 0.91 0.74 0.79 0.71 0.70  CALCIUM 8.1* 8.0* 7.9* 8.0* 8.2*  MG  --   --  1.8 1.8  --   ALKPHOS 44  --   --   --   --   AST 20  --   --   --   --   ALT 13  --   --   --   --   ALBUMIN 2.6*  --   --   --   --     Dg Chest 1 View  Result Date: 11/05/2018 CLINICAL DATA:  Near-syncope. EXAM: CHEST  1 VIEW COMPARISON:  Chest x-ray dated March 19, 2017. FINDINGS: Stable mild cardiomegaly status post CABG. Normal mediastinal contours. Atherosclerotic calcification of the aortic arch. Normal pulmonary vascularity. No focal consolidation, pleural effusion, or pneumothorax. No acute osseous abnormality. IMPRESSION: No active disease. Electronically Signed   By: Titus Dubin M.D.   On: 11/05/2018 17:40   Ct Head Wo Contrast  Result Date: 11/09/2018 CLINICAL DATA:  Headache.  Unwitnessed fall.  Anti coagulation. EXAM: CT HEAD WITHOUT CONTRAST TECHNIQUE: Contiguous axial images were obtained from the base of the skull through the vertex without intravenous contrast. COMPARISON:  None. FINDINGS: Brain: 9 mm in diameter hypodense left thalamic lesion. 8 mm in diameter hypodense lesion of the lower portion of the right internal capsule. Both of these are probably age indeterminate lacunar infarcts. Periventricular white matter and corona radiata hypodensities favor chronic ischemic microvascular white matter disease. No intracranial hemorrhage or mass lesion identified. No other significant findings. Vascular: Vertebrobasilar atherosclerosis. There is atherosclerotic calcification of the cavernous carotid arteries bilaterally. Skull: Unremarkable  Sinuses/Orbits: Chronic ethmoid and left maxillary sinusitis. Other: No supplemental non-categorized findings. IMPRESSION: 1. Hypodensities favoring age-indeterminate lacunar infarcts in the left thalamus and in the right internal capsule. 2. Periventricular white matter and corona radiata hypodensities favor chronic ischemic microvascular white matter disease. 3. No acute intracranial hemorrhage. 4. Chronic ethmoid and left maxillary sinusitis. 5. Atherosclerosis. Electronically Signed   By: Van Clines M.D.   On: 11/09/2018 12:32   Ct Pelvis Wo Contrast  Result Date: 11/08/2018 CLINICAL DATA:  Severe right hip pain after multiple falls. EXAM: CT PELVIS WITHOUT CONTRAST TECHNIQUE: Multidetector CT imaging of the pelvis was performed following the standard protocol without intravenous contrast. COMPARISON:  Right hip x-rays from same day. FINDINGS: Urinary Tract:  No abnormality visualized. Bowel:  Unremarkable visualized pelvic bowel loops. Vascular/Lymphatic: Aortoiliac atherosclerotic vascular disease. No pathologically enlarged lymph nodes. Reproductive:  Mild prostatomegaly. Other:  Nonspecific presacral stranding. Musculoskeletal: Ill-defined hyperdensity within and between the right gluteus maximus and medius muscles extending inferiorly into the posterior upper thigh with mass effect and medial displacement of the sciatic nerve, consistent with hematoma. The hematoma is difficult to measure, but spans a craniocaudal length of approximately 20.6 cm.  Superficial soft tissue edema in the lateral right hip. No acute fracture or dislocation. Mild bilateral hip joint space narrowing. Severe degenerative disc disease at L5-S1. IMPRESSION: 1. Ill-defined large right gluteal hematoma extending inferiorly into the posterior upper thigh with mass effect and medial displacement the sciatic nerve. 2.  No acute osseous abnormality. 3.  Aortic atherosclerosis (ICD10-I70.0). Electronically Signed   By: Titus Dubin M.D.   On: 11/08/2018 10:48   Dg Chest Port 1 View  Result Date: 11/08/2018 CLINICAL DATA:  RIGHT femur pain, fell on Monday EXAM: PORTABLE CHEST 1 VIEW COMPARISON:  Portable exam 1630 hours compared to 11/05/2018 FINDINGS: Normal heart size post CABG. Atherosclerotic calcification aorta. Mediastinal contours and pulmonary vascularity normal. Lungs clear. No pulmonary infiltrate, pleural effusion or pneumothorax. IMPRESSION: No acute abnormalities. Electronically Signed   By: Lavonia Dana M.D.   On: 11/08/2018 16:46   Dg Hip Unilat W Or Wo Pelvis 2-3 Views Right  Result Date: 11/08/2018 CLINICAL DATA:  Right lateral hip pain after a recent fall. EXAM: DG HIP (WITH OR WITHOUT PELVIS) 2-3V RIGHT COMPARISON:  11/05/2018 FINDINGS: No acute fracture or hip dislocation is identified. Hip joint space widths are preserved with at most mild acetabular spurring. Atherosclerotic vascular calcifications are noted. IMPRESSION: No acute osseous abnormality identified. Electronically Signed   By: Logan Bores M.D.   On: 11/08/2018 09:11   Dg Hip Unilat W Or Wo Pelvis 2-3 Views Right  Result Date: 11/05/2018 CLINICAL DATA:  Right hip pain after fall last night. EXAM: DG HIP (WITH OR WITHOUT PELVIS) 2-3V RIGHT COMPARISON:  None. FINDINGS: There is no evidence of hip fracture or dislocation. There is no evidence of arthropathy or other focal bone abnormality. IMPRESSION: Negative. Electronically Signed   By: Titus Dubin M.D.   On: 11/05/2018 17:41   Dg Femur Portable 1 View Right  Result Date: 11/08/2018 CLINICAL DATA:  Fall EXAM: RIGHT FEMUR PORTABLE 1 VIEW COMPARISON:  Portable AP exam at 1638 hours without priors for comparison FINDINGS: Osseous mineralization normal. Knee and hip joint spaces preserved. No acute fracture, dislocation, or bone destruction. Scattered atherosclerotic calcifications. IMPRESSION: No acute osseous abnormalities. Electronically Signed   By: Lavonia Dana M.D.   On: 11/08/2018 16:47      Results/Tests Pending at Time of Discharge: none  Discharge Medications:  Allergies as of 11/12/2018      Reactions   Contrast Media [iodinated Diagnostic Agents] Rash   19 years ago at [facility]   Metrizamide Rash   19 years ago at [facility] (Amipaque)   Other Other (See Comments)   Patient preference: NO MEAT OR DAIRY!!   Amiodarone Other (See Comments)   Makes the patient walk sideways and he falls   Ciprofloxacin Other (See Comments)   Was told by MD to "not take"   Tramadol Nausea And Vomiting      Medication List    STOP taking these medications   aspirin EC 81 MG tablet   Eliquis 5 MG Tabs tablet Generic drug: apixaban   lisinopril 5 MG tablet Commonly known as: ZESTRIL     TAKE these medications   benzonatate 100 MG capsule Commonly known as: TESSALON Take 1 capsule (100 mg total) by mouth 3 (three) times daily as needed for cough.   Crestor 20 MG tablet Generic drug: rosuvastatin Take 20 mg by mouth every morning. Notes to patient: 11/13/2018   dofetilide 125 MCG capsule Commonly known as: TIKOSYN Take 125 mcg by  mouth 2 (two) times daily. Notes to patient: 11/12/2018 evening   furosemide 40 MG tablet Commonly known as: LASIX Take 20 mg by mouth every morning. Notes to patient: 11/13/2018   gabapentin 100 MG capsule Commonly known as: NEURONTIN Take 100 mg by mouth 2 (two) times a day. Morning and bedtime Notes to patient: 11/12/2018 evening   guaiFENesin 600 MG 12 hr tablet Commonly known as: MUCINEX Take 2 tablets (1,200 mg total) by mouth 2 (two) times daily. Notes to patient: 11/12/2018 evening   metoprolol tartrate 25 MG tablet Commonly known as: LOPRESSOR Take 0.5 tablets (12.5 mg total) by mouth 2 (two) times daily. What changed:   medication strength  how much to take Notes to patient: 11/12/2018 evening   multivitamin-lutein Caps capsule Take 1 capsule by mouth daily with breakfast. Notes to  patient: 11/13/2018   One-A-Day Mens 50+ Advantage Tabs Take 1 tablet by mouth daily with breakfast. Notes to patient: 11/13/2018   Neti Pot Sinus Wash 2300-700 MG Kit Generic drug: Sodium Chloride-Sodium Bicarb Place into the nose daily as needed (for congestion).   nitroGLYCERIN 0.4 MG SL tablet Commonly known as: NITROSTAT Place 0.4 mg under the tongue every 5 (five) minutes as needed for chest pain.   NON FORMULARY Place under the tongue See admin instructions. CBD oil: Place 2-3 droppersful sublingually 2 times a day Notes to patient: 11/12/2018 evening   Omega 3 1000 MG Caps Take 1,000 mg by mouth daily with breakfast. Notes to patient: 11/13/2018   polyethylene glycol 17 g packet Commonly known as: MIRALAX / GLYCOLAX Take 17 g by mouth daily as needed for moderate constipation.   potassium chloride SA 20 MEQ tablet Commonly known as: K-DUR Take 20 mEq by mouth every morning. Notes to patient: 11/13/2018   Vitamin D-3 25 MCG (1000 UT) Caps Take 1,000 Units by mouth daily after breakfast. Notes to patient: 11/13/2018       Discharge Instructions: Please refer to Patient Instructions section of EMR for full details.  Patient was counseled important signs and symptoms that should prompt return to medical care, changes in medications, dietary instructions, activity restrictions, and follow up appointments.   Follow-Up Appointments:   Matilde Haymaker, MD 11/12/2018, 5:04 PM PGY-1, [place]

## 2018-11-12 NOTE — Progress Notes (Signed)
Family Medicine Teaching Service Daily Progress Note Intern Pager: [phone]  Patient name: Edgar Thomas Wingard Medical record number: [id number] Date of birth: 05/26/1930 Age: 85 y.o. Gender: male  Primary Care Provider: Simone CuriaLee, Keung, MD Consultants: none Code Status: full  Pt Overview and Major Events to Date:  6/13 admitted with syncope from R hip hematoma  Assessment and Plan: Edgar Thomas Longmire is a 85 y.o. male presenting with syncope and symptomatic anemia.  His past medical history is significant for CAD s/p CABG x4 (11/2013), A. fib on Eliquis, HFrEF, gait disturbance.  Anemia 2/2 R hip hematoma. In the setting of fall on anticoagulation. Hgb 7.9>s/p 1U RBC>8.5>8.1>7.8.10.6>9.3>9.7>9.0>9.4>9.6.  His hemoglobin appears to have stabilized.  We will plan for discharge home today with physical therapy and resumption of anticoagulation on outpatient follow-up. -Per ortho, no indication for surgical intervention. Could consider reversal with FFP if Hgb drops further. Following. Appreciate additional recommendations -Transfuse for hemoglobin under 8 -Holding apixaban and aspirin, plan to resume outpatient -CBC every 12 hours -Telemetry -continue iron supplementation -PT - HH -OT - HH  Syncope Likely secondary to acute anemia.  His orthostatic vitals on 6/15 continued to show significant drop (35 mmHg) from sitting to standing.  Plan to hold blood pressure medication on discharge and restart blood pressure medication on an outpatient basis. -Reattempt orthostatic vitals with stable hemoglobin -continue to hold lasix due to orthostatic hypotension -will consider holding metoprolol and lisinopril due to orthostatic hypotension   A. Fib Cardiology following due to brief episodes of atrial fibrillation while inpatient.  Cardiology is recommending 30-day event monitor at discharge to further assess and control A. fib in the outpatient setting.  A. fib is likely poorly controlled in the setting of acute  anemia. -Holding Eliquis as per above -Continue dofetilide -metolprolol 25 mg BID  HFrEF, chronic. Not in exacerbation.  -Hold furosemide - lisinopril -metoprolol as above -Monitor fluid status, giving IV lasix with transfusion this morning  Hx CAD s/p CABG x4 (2015) -Crestor 20 mg -Nitroglycerin SL prn -hold ASA  Chronic leg pain -Continue gabapentin 100 mg twice daily  FEN/GI:Heart healthy, miralax prn Prophylaxis:SCDs  Disposition: 1-2 additional days of hospitalization anticipated before discharge  Subjective:  No acute events overnight.  No new complaints this morning.  He continues to feel well and reports progress well working with physical therapy.  He denies chest pain and shortness of breath.  Objective: Temp:  [98.2 F (36.8 C)-99.3 F (37.4 C)] 98.8 F (37.1 C) (06/16 0344) Pulse Rate:  [65-80] 80 (06/16 0344) Resp:  [16] 16 (06/16 0344) BP: (111-170)/(63-87) 166/80 (06/16 0344) SpO2:  [93 %-99 %] 93 % (06/16 0344) Physical Exam: General: Alert and cooperative and appears to be in no acute distress.  Resting in bed comfortably eating breakfast. HEENT: Neck non-tender without lymphadenopathy, masses or thyromegaly Cardio: Normal S1 and S2, no S3 or S4.  Irregularly irregular rhythm.   Pulm: Clear to auscultation bilaterally, no crackles, wheezing, or diminished breath sounds. Normal respiratory effort Abdomen: Bowel sounds normal. Abdomen soft and non-tender.  Extremities: No peripheral edema. Warm/ well perfused.  Strong radial pulses. Neuro: Cranial nerves grossly intact    Laboratory: Recent Labs  Lab 11/11/18 0232 11/11/18 1430 11/12/18 0416  WBC 7.5 7.0 7.7  HGB 9.0* 9.4* 9.6*  HCT 26.9* 28.8* 28.9*  PLT 163 175 193   Recent Labs  Lab 11/08/18 1622  11/10/18 0253 11/11/18 0232 11/12/18 0416  NA 137   < > 137 136 137  K 4.1   < >  3.8 3.8 4.1  CL 107   < > 109 107 108  CO2 24   < > 23 22 23   BUN 22   < > 22 22 20   CREATININE  0.91   < > 0.79 0.71 0.70  CALCIUM 8.1*   < > 7.9* 8.0* 8.2*  PROT 5.4*  --   --   --   --   BILITOT 1.3*  --   --   --   --   ALKPHOS 44  --   --   --   --   ALT 13  --   --   --   --   AST 20  --   --   --   --   GLUCOSE 138*   < > 106* 105* 108*   < > = values in this interval not displayed.     Imaging/Diagnostic Tests: No results found.  Matilde Haymaker, MD 11/12/2018, 6:12 AM PGY-3, [facility] Intern pager: [phone], text pages welcome

## 2018-11-15 DIAGNOSIS — I4891 Unspecified atrial fibrillation: Secondary | ICD-10-CM | POA: Diagnosis not present

## 2018-11-15 DIAGNOSIS — I2510 Atherosclerotic heart disease of native coronary artery without angina pectoris: Secondary | ICD-10-CM | POA: Diagnosis not present

## 2018-11-15 DIAGNOSIS — D649 Anemia, unspecified: Secondary | ICD-10-CM | POA: Diagnosis not present

## 2018-11-15 DIAGNOSIS — R600 Localized edema: Secondary | ICD-10-CM | POA: Diagnosis not present

## 2018-11-19 ENCOUNTER — Ambulatory Visit: Payer: Medicare Other | Admitting: Podiatry

## 2018-11-19 ENCOUNTER — Encounter: Payer: Self-pay | Admitting: Student

## 2018-11-19 DIAGNOSIS — S7011XA Contusion of right thigh, initial encounter: Secondary | ICD-10-CM | POA: Insufficient documentation

## 2018-11-20 DIAGNOSIS — I480 Paroxysmal atrial fibrillation: Secondary | ICD-10-CM | POA: Diagnosis not present

## 2018-11-20 DIAGNOSIS — I509 Heart failure, unspecified: Secondary | ICD-10-CM | POA: Diagnosis not present

## 2018-11-20 DIAGNOSIS — I2510 Atherosclerotic heart disease of native coronary artery without angina pectoris: Secondary | ICD-10-CM | POA: Diagnosis not present

## 2018-11-22 DIAGNOSIS — D649 Anemia, unspecified: Secondary | ICD-10-CM | POA: Diagnosis not present

## 2018-11-22 DIAGNOSIS — I2510 Atherosclerotic heart disease of native coronary artery without angina pectoris: Secondary | ICD-10-CM | POA: Diagnosis not present

## 2018-11-22 DIAGNOSIS — R600 Localized edema: Secondary | ICD-10-CM | POA: Diagnosis not present

## 2018-11-22 DIAGNOSIS — I4891 Unspecified atrial fibrillation: Secondary | ICD-10-CM | POA: Diagnosis not present

## 2018-12-03 DIAGNOSIS — D649 Anemia, unspecified: Secondary | ICD-10-CM | POA: Diagnosis not present

## 2018-12-03 DIAGNOSIS — R600 Localized edema: Secondary | ICD-10-CM | POA: Diagnosis not present

## 2018-12-03 DIAGNOSIS — I2510 Atherosclerotic heart disease of native coronary artery without angina pectoris: Secondary | ICD-10-CM | POA: Diagnosis not present

## 2018-12-03 DIAGNOSIS — I4891 Unspecified atrial fibrillation: Secondary | ICD-10-CM | POA: Diagnosis not present

## 2018-12-23 DIAGNOSIS — I2510 Atherosclerotic heart disease of native coronary artery without angina pectoris: Secondary | ICD-10-CM | POA: Diagnosis not present

## 2018-12-23 DIAGNOSIS — I5030 Unspecified diastolic (congestive) heart failure: Secondary | ICD-10-CM | POA: Diagnosis not present

## 2018-12-23 DIAGNOSIS — I480 Paroxysmal atrial fibrillation: Secondary | ICD-10-CM | POA: Diagnosis not present

## 2018-12-31 DIAGNOSIS — R600 Localized edema: Secondary | ICD-10-CM | POA: Diagnosis not present

## 2018-12-31 DIAGNOSIS — I2510 Atherosclerotic heart disease of native coronary artery without angina pectoris: Secondary | ICD-10-CM | POA: Diagnosis not present

## 2018-12-31 DIAGNOSIS — D649 Anemia, unspecified: Secondary | ICD-10-CM | POA: Diagnosis not present

## 2018-12-31 DIAGNOSIS — I4891 Unspecified atrial fibrillation: Secondary | ICD-10-CM | POA: Diagnosis not present

## 2019-01-28 DIAGNOSIS — R600 Localized edema: Secondary | ICD-10-CM | POA: Diagnosis not present

## 2019-01-28 DIAGNOSIS — I4891 Unspecified atrial fibrillation: Secondary | ICD-10-CM | POA: Diagnosis not present

## 2019-01-28 DIAGNOSIS — I2510 Atherosclerotic heart disease of native coronary artery without angina pectoris: Secondary | ICD-10-CM | POA: Diagnosis not present

## 2019-01-28 DIAGNOSIS — D649 Anemia, unspecified: Secondary | ICD-10-CM | POA: Diagnosis not present

## 2019-02-25 DIAGNOSIS — R600 Localized edema: Secondary | ICD-10-CM | POA: Diagnosis not present

## 2019-02-25 DIAGNOSIS — D649 Anemia, unspecified: Secondary | ICD-10-CM | POA: Diagnosis not present

## 2019-02-25 DIAGNOSIS — I2510 Atherosclerotic heart disease of native coronary artery without angina pectoris: Secondary | ICD-10-CM | POA: Diagnosis not present

## 2019-02-25 DIAGNOSIS — I4891 Unspecified atrial fibrillation: Secondary | ICD-10-CM | POA: Diagnosis not present

## 2019-03-25 DIAGNOSIS — I2510 Atherosclerotic heart disease of native coronary artery without angina pectoris: Secondary | ICD-10-CM | POA: Diagnosis not present

## 2019-03-25 DIAGNOSIS — R600 Localized edema: Secondary | ICD-10-CM | POA: Diagnosis not present

## 2019-03-25 DIAGNOSIS — D649 Anemia, unspecified: Secondary | ICD-10-CM | POA: Diagnosis not present

## 2019-03-25 DIAGNOSIS — I4891 Unspecified atrial fibrillation: Secondary | ICD-10-CM | POA: Diagnosis not present

## 2019-04-22 DIAGNOSIS — I4891 Unspecified atrial fibrillation: Secondary | ICD-10-CM | POA: Diagnosis not present

## 2019-04-22 DIAGNOSIS — D649 Anemia, unspecified: Secondary | ICD-10-CM | POA: Diagnosis not present

## 2019-04-22 DIAGNOSIS — R600 Localized edema: Secondary | ICD-10-CM | POA: Diagnosis not present

## 2019-04-22 DIAGNOSIS — I2510 Atherosclerotic heart disease of native coronary artery without angina pectoris: Secondary | ICD-10-CM | POA: Diagnosis not present

## 2019-05-07 DIAGNOSIS — Z9181 History of falling: Secondary | ICD-10-CM | POA: Diagnosis not present

## 2019-05-07 DIAGNOSIS — Z0000 Encounter for general adult medical examination without abnormal findings: Secondary | ICD-10-CM | POA: Diagnosis not present

## 2019-05-07 DIAGNOSIS — E785 Hyperlipidemia, unspecified: Secondary | ICD-10-CM | POA: Diagnosis not present

## 2019-05-07 DIAGNOSIS — Z1331 Encounter for screening for depression: Secondary | ICD-10-CM | POA: Diagnosis not present

## 2019-05-20 DIAGNOSIS — D649 Anemia, unspecified: Secondary | ICD-10-CM | POA: Diagnosis not present

## 2019-05-20 DIAGNOSIS — R600 Localized edema: Secondary | ICD-10-CM | POA: Diagnosis not present

## 2019-05-20 DIAGNOSIS — I4891 Unspecified atrial fibrillation: Secondary | ICD-10-CM | POA: Diagnosis not present

## 2019-05-20 DIAGNOSIS — I2510 Atherosclerotic heart disease of native coronary artery without angina pectoris: Secondary | ICD-10-CM | POA: Diagnosis not present

## 2019-11-24 ENCOUNTER — Ambulatory Visit: Payer: Medicare Other | Admitting: Podiatry

## 2020-10-13 DIAGNOSIS — I2510 Atherosclerotic heart disease of native coronary artery without angina pectoris: Secondary | ICD-10-CM | POA: Diagnosis not present

## 2020-10-13 DIAGNOSIS — M159 Polyosteoarthritis, unspecified: Secondary | ICD-10-CM | POA: Diagnosis not present

## 2020-10-13 DIAGNOSIS — J028 Acute pharyngitis due to other specified organisms: Secondary | ICD-10-CM | POA: Diagnosis not present

## 2020-10-13 DIAGNOSIS — B9689 Other specified bacterial agents as the cause of diseases classified elsewhere: Secondary | ICD-10-CM | POA: Diagnosis not present

## 2020-10-26 DIAGNOSIS — R600 Localized edema: Secondary | ICD-10-CM | POA: Diagnosis not present

## 2020-10-26 DIAGNOSIS — I2510 Atherosclerotic heart disease of native coronary artery without angina pectoris: Secondary | ICD-10-CM | POA: Diagnosis not present

## 2020-10-26 DIAGNOSIS — G459 Transient cerebral ischemic attack, unspecified: Secondary | ICD-10-CM | POA: Diagnosis not present

## 2020-10-26 DIAGNOSIS — I10 Essential (primary) hypertension: Secondary | ICD-10-CM | POA: Diagnosis not present

## 2020-10-28 DIAGNOSIS — R4781 Slurred speech: Secondary | ICD-10-CM | POA: Diagnosis not present

## 2020-10-28 DIAGNOSIS — G319 Degenerative disease of nervous system, unspecified: Secondary | ICD-10-CM | POA: Diagnosis not present

## 2020-10-28 DIAGNOSIS — R29810 Facial weakness: Secondary | ICD-10-CM | POA: Diagnosis not present

## 2020-10-28 DIAGNOSIS — I639 Cerebral infarction, unspecified: Secondary | ICD-10-CM | POA: Diagnosis not present

## 2020-11-02 DIAGNOSIS — Z8673 Personal history of transient ischemic attack (TIA), and cerebral infarction without residual deficits: Secondary | ICD-10-CM | POA: Diagnosis not present

## 2020-11-02 DIAGNOSIS — I10 Essential (primary) hypertension: Secondary | ICD-10-CM | POA: Diagnosis not present

## 2020-11-02 DIAGNOSIS — R600 Localized edema: Secondary | ICD-10-CM | POA: Diagnosis not present

## 2020-11-02 DIAGNOSIS — I2510 Atherosclerotic heart disease of native coronary artery without angina pectoris: Secondary | ICD-10-CM | POA: Diagnosis not present

## 2020-11-03 ENCOUNTER — Encounter: Payer: Self-pay | Admitting: *Deleted

## 2020-11-04 ENCOUNTER — Encounter: Payer: Self-pay | Admitting: Neurology

## 2020-11-04 ENCOUNTER — Telehealth: Payer: Self-pay | Admitting: Neurology

## 2020-11-04 ENCOUNTER — Ambulatory Visit: Payer: Medicare Other | Admitting: Neurology

## 2020-11-04 VITALS — BP 177/78 | HR 80 | Ht 70.0 in | Wt 147.8 lb

## 2020-11-04 DIAGNOSIS — Z5181 Encounter for therapeutic drug level monitoring: Secondary | ICD-10-CM

## 2020-11-04 DIAGNOSIS — I63412 Cerebral infarction due to embolism of left middle cerebral artery: Secondary | ICD-10-CM | POA: Diagnosis not present

## 2020-11-04 DIAGNOSIS — R269 Unspecified abnormalities of gait and mobility: Secondary | ICD-10-CM | POA: Diagnosis not present

## 2020-11-04 DIAGNOSIS — I639 Cerebral infarction, unspecified: Secondary | ICD-10-CM | POA: Insufficient documentation

## 2020-11-04 HISTORY — DX: Cerebral infarction, unspecified: I63.9

## 2020-11-04 NOTE — Progress Notes (Signed)
Reason for visit: Stroke   Referring physician: Dr. [name] is a 87 y.o. male  History of present illness:  Edgar Thomas is an 87 year old right-handed white male with a history of hypertension, dyslipidemia, coronary artery disease, and atrial fibrillation.  The patient was noted to have onset of right facial weakness that began around 26 Oct 2020.  The patient had associated slurring of speech, he denied any headache or vision changes.  He reported no numbness or weakness of the arms or legs.  He has not had any difficulty with swallowing.  He has a chronic issue with gait instability, he has not had any recent falls.  He was seen by his primary care physician and was set up for MRI of the brain that showed evidence of an acute infarct in the lenticulostriate area of the left and the left external capsule.  The patient has a history of atrial fibrillation, he apparently was taken off of Eliquis in 2020 after a fall with a intramuscular hematoma, he was never placed back on the medication.  The patient has not been on any antiplatelet medications.  He did have a recent episode of shingles involving the left side, he has had ongoing discomfort and pain from this and remains on gabapentin.  He is sent to this office for an evaluation.  Past Medical History:  Diagnosis Date   A-fib ([place])    Anemia    CAD (coronary artery disease)    Constipation    GERD (gastroesophageal reflux disease)    Heart attack ([place])    Heartburn    Hyperlipemia    Hypertension    Post herpetic neuralgia    Stroke (cerebrum) ([place]) 11/04/2020   Left lenticulostriate and external capsule   Stroke ([place])    TIA (transient ischemic attack)    Vitamin D deficiency     Past Surgical History:  Procedure Laterality Date   balloon angioplasty of LAD     CARDIOVERSION  2018   a fib   CATARACT EXTRACTION, BILATERAL  1019, 2022   COLONOSCOPY     CORONARY ARTERY BYPASS GRAFT N/A 12/03/2013   Procedure: CORONARY  ARTERY BYPASS GRAFTING (CABG) x4 using left internal mammary artery and right greater saphenous vein. LIMA to LAD, sequential SVG to OM 1 & OM 2, SVG to PD;  Surgeon: Grace Isaac, MD;  Location: [facility];  Service: Open Heart Surgery;  Laterality: N/A;   ENDOVEIN HARVEST OF GREATER SAPHENOUS VEIN Right 12/03/2013   Procedure: ENDOVEIN HARVEST OF GREATER SAPHENOUS VEIN;  Surgeon: Grace Isaac, MD;  Location: [place] [facility];  Service: Open Heart Surgery;  Laterality: Right;   INTRAOPERATIVE TRANSESOPHAGEAL ECHOCARDIOGRAM N/A 12/03/2013   Procedure: INTRAOPERATIVE TRANSESOPHAGEAL ECHOCARDIOGRAM;  Surgeon: Grace Isaac, MD;  Location: [facility];  Service: Open Heart Surgery;  Laterality: N/A;    Family History  Problem Relation Age of Onset   Heart disease Mother        died at age 67 of heart attack   Leukemia Sister    Heart disease Brother    Hypertension Brother     Social history:  reports that he has never smoked. He has never used smokeless tobacco. He reports that he does not drink alcohol and does not use drugs.  Medications:  Prior to Admission medications   Medication Sig Start Date End Date Taking? Authorizing Provider  benzonatate (TESSALON) 100 MG capsule Take 1 capsule (100 mg total) by mouth 3 (three)  times daily as needed for cough. Patient not taking: Reported on 11/08/2018 08/30/15   Rai, Vernelle Emerald, MD  Cholecalciferol (VITAMIN D-3) 25 MCG (1000 UT) CAPS Take 1,000 Units by mouth daily after breakfast.    [provider]  CRESTOR 20 MG tablet Take 20 mg by mouth every morning.  08/26/14   [provider]  dofetilide (TIKOSYN) 125 MCG capsule Take 125 mcg by mouth 2 (two) times daily. 05/20/18   [provider]  furosemide (LASIX) 40 MG tablet Take 20 mg by mouth every morning.  03/27/18   [provider]  gabapentin (NEURONTIN) 100 MG capsule Take 100 mg by mouth 2 (two) times a day. Morning and bedtime 04/19/18   [provider]   guaiFENesin (MUCINEX) 600 MG 12 hr tablet Take 2 tablets (1,200 mg total) by mouth 2 (two) times daily. Patient not taking: Reported on 11/08/2018 08/30/15   Rai, Vernelle Emerald, MD  metoprolol tartrate (LOPRESSOR) 25 MG tablet Take 0.5 tablets (12.5 mg total) by mouth 2 (two) times daily. 11/12/18   Matilde Haymaker, MD  Multiple Vitamins-Minerals (ONE-A-DAY MENS 50+ ADVANTAGE) TABS Take 1 tablet by mouth daily with breakfast.     [provider]  multivitamin-lutein (OCUVITE-LUTEIN) CAPS capsule Take 1 capsule by mouth daily with breakfast.     [provider]  nitroGLYCERIN (NITROSTAT) 0.4 MG SL tablet Place 0.4 mg under the tongue every 5 (five) minutes as needed for chest pain.    [provider]  NON FORMULARY Place under the tongue See admin instructions. CBD oil: Place 2-3 droppersful sublingually 2 times a day    [provider]  Omega 3 1000 MG CAPS Take 1,000 mg by mouth daily with breakfast.     [provider]  polyethylene glycol (MIRALAX / GLYCOLAX) packet Take 17 g by mouth daily as needed for moderate constipation. Patient not taking: Reported on 11/08/2018 08/30/15   Rai, Vernelle Emerald, MD  potassium chloride SA (K-DUR,KLOR-CON) 20 MEQ tablet Take 20 mEq by mouth every morning.  05/20/18   [provider]  Sodium Chloride-Sodium Bicarb (NETI POT SINUS [place]) 2300-700 MG KIT Place into the nose daily as needed (for congestion).     [provider]      Allergies  Allergen Reactions   Contrast Media [Iodinated Diagnostic Agents] Rash    [facility] years ago at [facility]   Metrizamide Rash    [facility] years ago at [facility] (Amipaque)   Other Other (See Comments)    Patient preference: NO MEAT OR DAIRY!!   Amiodarone Other (See Comments)    Makes the patient walk sideways and he falls   Ciprofloxacin Other (See Comments)    Was told by MD to "not take"   Tramadol Nausea And Vomiting    ROS:  Out of a complete  14 system review of symptoms, the patient complains only of the following symptoms, and all other reviewed systems are negative.  Facial weakness Slurred speech Walking difficulty  Blood pressure (!) 177/78, pulse 80, height _0  (1.778 m), weight 147 lb 12.8 oz (67 kg).  Physical Exam  General: The patient is alert and cooperative at the time of the examination.  Eyes: Pupils are equal, round, and reactive to light. Discs are flat bilaterally.  Neck: The neck is supple, no carotid bruits are noted.  Respiratory: The respiratory examination is clear.  Cardiovascular: The cardiovascular examination reveals a regular rate and rhythm, no obvious murmurs or  rubs are noted.  Skin: Extremities are without significant edema.  Neurologic Exam  Mental status: The patient is alert and oriented x 3 at the time of the examination. The patient has apparent normal recent and remote memory, with an apparently normal attention span and concentration ability.  Cranial nerves: Facial symmetry is not present.  There is slight droopiness of the right lower face.  There is good sensation of the face to pinprick and soft touch bilaterally. The strength of the facial muscles and the muscles to head turning and shoulder shrug are normal bilaterally. Speech is slightly dysarthric, hypophonic, not aphasic. Extraocular movements are full. Visual fields are full. The tongue is midline, and the patient has symmetric elevation of the soft palate. No obvious hearing deficits are noted.  Motor: The motor testing reveals 5 over 5 strength of all 4 extremities. Good symmetric motor tone is noted throughout.  Sensory: Sensory testing is intact to pinprick, soft touch, vibration sensation, and position sense on all 4 extremities, with exception of his depression of the position sense in both feet. No evidence of extinction is noted.  Coordination: Cerebellar testing reveals good finger-nose-finger and heel-to-shin  bilaterally.  Gait and station: Gait is wide-based, unsteady, the patient walks independently.  Tandem gait is not attempted.  Romberg is negative.  Reflexes: Deep tendon reflexes are symmetric and normal bilaterally. Toes are downgoing bilaterally.   MRI brain 10/27/20:  Impression: Acute infarct left lenticulostriate territory involving putamen and external capsule posteriorly. Advanced atrophy and chronic microvascular ischemic changes.   Assessment/Plan:  1.  Recent left brain stroke  2.  Advanced small vessel disease by MRI brain  3.  History of atrial fibrillation  4.  Gait disorder  The patient has sustained a recent stroke event.  He will be sent for MRA of the neck for further evaluation.  The patient may be seeing his cardiologist in the near future.  He is on anticoagulation currently which is reasonable.  He will be sent for physical therapy for gait training.  Jill Alexanders MD 11/04/2020 2:33 PM  [facility] [address] [place], [place] [address]  Phone [phone] Fax [phone]

## 2020-11-04 NOTE — Telephone Encounter (Signed)
11/04/20 [employer] NPR sent to GI

## 2020-11-05 LAB — COMPREHENSIVE METABOLIC PANEL
ALT: 10 IU/L (ref 0–44)
AST: 20 IU/L (ref 0–40)
Albumin/Globulin Ratio: 1.3 (ref 1.2–2.2)
Albumin: 4.3 g/dL (ref 3.6–4.6)
Alkaline Phosphatase: 84 IU/L (ref 44–121)
BUN/Creatinine Ratio: 26 — ABNORMAL HIGH (ref 10–24)
BUN: 25 mg/dL (ref 8–27)
Bilirubin Total: 0.8 mg/dL (ref 0.0–1.2)
CO2: 25 mmol/L (ref 20–29)
Calcium: 10 mg/dL (ref 8.6–10.2)
Chloride: 101 mmol/L (ref 96–106)
Creatinine, Ser: 0.95 mg/dL (ref 0.76–1.27)
Globulin, Total: 3.2 g/dL (ref 1.5–4.5)
Glucose: 94 mg/dL (ref 65–99)
Potassium: 4.4 mmol/L (ref 3.5–5.2)
Sodium: 140 mmol/L (ref 134–144)
Total Protein: 7.5 g/dL (ref 6.0–8.5)
eGFR: 77 mL/min/{1.73_m2} (ref >59)

## 2020-11-08 DIAGNOSIS — K5900 Constipation, unspecified: Secondary | ICD-10-CM | POA: Diagnosis not present

## 2020-11-08 DIAGNOSIS — R300 Dysuria: Secondary | ICD-10-CM | POA: Diagnosis not present

## 2020-11-08 DIAGNOSIS — K5909 Other constipation: Secondary | ICD-10-CM | POA: Diagnosis not present

## 2020-11-09 ENCOUNTER — Telehealth: Payer: Self-pay

## 2020-11-09 NOTE — Telephone Encounter (Signed)
-----   Message from York Spaniel, MD sent at 11/05/2020  7:23 AM EDT -----  The blood work results are unremarkable. Please call the patient.  ----- Message ----- From: [facility] Lab Results In Sent: 11/05/2020   5:42 AM EDT To: York Spaniel, MD

## 2020-11-09 NOTE — Telephone Encounter (Signed)
I called patient to discuss.  No answer, message was full, unable to leave a message.  There

## 2020-11-11 ENCOUNTER — Telehealth: Payer: Self-pay | Admitting: Emergency Medicine

## 2020-11-11 NOTE — Telephone Encounter (Signed)
-----   Message from Charles K Willis, MD sent at 11/05/2020  7:23 AM EDT -----  The blood work results are unremarkable. Please call the patient.  ----- Message ----- From: Interface, [facility] Lab Results In Sent: 11/05/2020   5:42 AM EDT To: Charles K Willis, MD   

## 2020-11-11 NOTE — Telephone Encounter (Signed)
Reached patient's daughter on the phone.  Discussed Dr. Clarisa Kindred review and findings of patient's blood work.  Patient denied further questions, verbalized understanding and expressed appreciation for the phone call.

## 2020-11-22 ENCOUNTER — Encounter: Payer: Self-pay | Admitting: Physical Therapy

## 2020-11-22 ENCOUNTER — Other Ambulatory Visit: Payer: Self-pay

## 2020-11-22 ENCOUNTER — Ambulatory Visit
Admission: RE | Admit: 2020-11-22 | Discharge: 2020-11-22 | Disposition: A | Discharge status: Home / Self Care | Payer: Medicare Other | Source: Ambulatory Visit | Attending: Neurology | Admitting: Neurology

## 2020-11-22 ENCOUNTER — Ambulatory Visit: Payer: Medicare Other | Attending: Neurology | Admitting: Physical Therapy

## 2020-11-22 VITALS — BP 152/83 | HR 62

## 2020-11-22 DIAGNOSIS — I63412 Cerebral infarction due to embolism of left middle cerebral artery: Secondary | ICD-10-CM

## 2020-11-22 DIAGNOSIS — R293 Abnormal posture: Secondary | ICD-10-CM | POA: Insufficient documentation

## 2020-11-22 DIAGNOSIS — R2681 Unsteadiness on feet: Secondary | ICD-10-CM | POA: Diagnosis not present

## 2020-11-22 DIAGNOSIS — R29818 Other symptoms and signs involving the nervous system: Secondary | ICD-10-CM | POA: Insufficient documentation

## 2020-11-22 DIAGNOSIS — M6281 Muscle weakness (generalized): Secondary | ICD-10-CM | POA: Insufficient documentation

## 2020-11-22 DIAGNOSIS — R2689 Other abnormalities of gait and mobility: Secondary | ICD-10-CM | POA: Diagnosis not present

## 2020-11-22 MED ORDER — GADOBENATE DIMEGLUMINE 529 MG/ML IV SOLN
13.0000 mL | Freq: Once | INTRAVENOUS | Status: AC | PRN
  Administered 2020-11-22: 13 mL via INTRAVENOUS

## 2020-11-22 NOTE — Therapy (Addendum)
[facility] [address] [place], [place], [address] Phone: [phone]   Fax:  [phone]  Physical Therapy Evaluation  Patient Details  Name: Edgar Thomas MRN: [id number] Date of Birth: 1930-02-13 Referring Provider (PT): Dr. Anne Hahn   Encounter Date: 11/22/2020   PT End of Session - 11/22/20 2053     Visit Number 1    Number of Visits 17    Date for PT Re-Evaluation 02/20/21    Authorization Type BCBS Medicare    PT Start Time 1401    PT Stop Time 1445    PT Time Calculation (min) 44 min    Equipment Utilized During Treatment Gait belt    Activity Tolerance Patient tolerated treatment well    Behavior During Therapy WFL for tasks assessed/performed             Past Medical History:  Diagnosis Date   A-fib (HCC)    Anemia    CAD (coronary artery disease)    Constipation    GERD (gastroesophageal reflux disease)    Heart attack (HCC)    Heartburn    Hyperlipemia    Hypertension    Post herpetic neuralgia    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 LIMA to LAD, sequential SVG to OM 1 & OM 2, SVG to PD;  Surgeon: Delight Ovens, MD;  Location: [facility] OR;  Service: Open Heart Surgery;  Laterality: N/A;   ENDOVEIN HARVEST OF GREATER SAPHENOUS VEIN Right 12/03/2013   Procedure: ENDOVEIN HARVEST OF GREATER SAPHENOUS VEIN;  Surgeon: Delight Ovens, MD;  Location: [facility] OR;  Service: Open Heart Surgery;  Laterality: Right;   INTRAOPERATIVE TRANSESOPHAGEAL  ECHOCARDIOGRAM N/A 12/03/2013   Procedure: INTRAOPERATIVE TRANSESOPHAGEAL ECHOCARDIOGRAM;  Surgeon: Delight Ovens, MD;  Location: [facility] OR;  Service: Open Heart Surgery;  Laterality: N/A;    Vitals:   11/22/20 1408  BP: (!) 152/83  Pulse: 62      Subjective Assessment - 11/22/20 1405     Subjective Pt has right facial weakness that began around 26 Oct 2020. Was seen by his PCP and was set up for MRI of the brain that showed evidence of an acute infarct in the lenticulostriate area of the left and the left external capsule.  Since the CVA reports more difficulties walking- now has to hold onto objects or his wife, did not have to use anything to help with walking prior to CVA. Pt's wife reports that his balance seems to be worse. Had a fall going from the bed to the recliner - did not turn around all the way to sit down. Has a couple of steps throughout the house and has some handles. 6 months prior to CVA he had stopped exercising, otherwise liked to use the treadmill.    Patient is accompained by: Family member   pt's wife [name]   Pertinent History PMH: a fib, CVA,HLD, HTN, CAD, CABG (2015)    Limitations Walking;Standing;House hold activities  Patient Stated Goals wants to improve his balance and walking.    Currently in Pain? No/denies                [facility] PT Assessment - 11/22/20 1414       Assessment   Medical Diagnosis CVA/gait abnormality    Referring Provider (PT) Dr. Anne Hahn    Onset Date/Surgical Date 10/26/20    Hand Dominance Right    Prior Therapy after CABG in 2015      Precautions   Precautions Fall      Balance Screen   Has the patient fallen in the past 6 months Yes    How many times? 2    Has the patient had a decrease in activity level because of a fear of falling?  Yes    Is the patient reluctant to leave their home because of a fear of falling?  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,buy and sell cars, airplanes      Observation/Other Assessments   Focus on Therapeutic Outcomes (FOTO)  58   72 predicted value     Sensation   Light Touch Appears Intact      Coordination   Gross Motor Movements are Fluid and Coordinated No    Heel Shin Test slower to perform with RLE      Posture/Postural Control   Posture/Postural Control Postural limitations    Postural Limitations Forward head;Posterior pelvic tilt      ROM / Strength   AROM / PROM / Strength Strength      Strength   Strength Assessment Site Hip;Knee;Ankle    Right/Left Hip Right;Left    Right Hip Flexion 4+/5    Left Hip Flexion 4+/5    Right/Left Knee Right;Left    Right Knee Flexion 4+/5    Right Knee Extension 4/5    Left Knee Flexion 4+/5    Left Knee Extension 4/5    Right/Left Ankle Right;Left    Right Ankle Dorsiflexion 4/5    Left Ankle Dorsiflexion 5/5      Transfers   Transfers Stand to Sit;Sit to Stand    Sit to Stand 4: Min guard    Five time sit to stand comments  23.63 seconds - some with single UE support others with none    Stand to Sit 4: Min guard      Ambulation/Gait   Ambulation/Gait Yes    Ambulation/Gait Assistance 4: Min assist    Ambulation Distance (Feet) --   clinic distances   Assistive device 1 person hand held assist    Gait Pattern Step-through pattern;Decreased arm swing - right;Decreased arm swing - left;Decreased stride  length;Decreased dorsiflexion - right;Decreased dorsiflexion - left;Poor foot clearance - left;Poor foot clearance - right;Trunk flexed    Ambulation  Surface Level;Indoor    Gait velocity 18.93 seconds = 1.73 ft/sec      Standardized Balance Assessment   Standardized Balance Assessment Timed Up and Go Test      Timed Up and Go Test   Normal TUG (seconds) 33.22    TUG Comments with single HHA from therapist, slow to turn                        Objective measurements completed on examination: See above findings.               PT Education - 11/22/20 2051     Education Details clinical findings, POC    Person(s) Educated Patient;Spouse    Methods Explanation    Comprehension Verbalized understanding              PT Short Term Goals - 11/23/20 1119       PT SHORT TERM GOAL #1   Title Pt will undergo further assessment of BERG with STG/LTG to be written as appropriate in order to demo decr fall risk. ALL STGS DUE 12/21/20    Time 4    Period Weeks    Status New    Target Date 12/21/20      PT SHORT TERM GOAL #2   Title Pt and pt's spouse will be independent with initial HEP in order to build upon functional gains made in therapy.    Time 4    Period Weeks    Status New      PT SHORT TERM GOAL #3   Title Pt will ambulate at least 76' with appropriate AD and supervision over indoor leel surfaces in order to demo improved household mobility.    Time 4    Period Weeks    Status New      PT SHORT TERM GOAL #4   Title Pt will improve gait speed to at least 2.0 ft/sec with appropriate AD in order to demo decr fall risk.    Baseline 1.73 ft/sec    Time 4    Period Weeks    Status New               PT Long Term Goals - 11/23/20 1121       PT LONG TERM GOAL #1   Title BERG goal to be written as appropriate in order to demo decr fall risk. ALL LTGS DUE 01/18/21    Baseline not yet assessed.    Time 8    Period Weeks    Status New     Target Date 01/18/21      PT LONG TERM GOAL #2   Title Pt will decr TUG time to 25 seconds or less with appropriate AD vs. no AD in order to demo decr fall risk.    Baseline 33.22 seconds with single HHA from therapist    Time 8    Period Weeks    Status New      PT LONG TERM GOAL #3   Title Pt will ambulate at least 200' over outdoor unlevel surfaces with LRAD with supervision in order to demo improved community mobility.    Time 8    Period Weeks    Status New      PT LONG TERM GOAL #4   Title Pt will improve gait speed to at least 2.5 ft/sec with appropriate AD in order to demo improved community mobility    Baseline 1.73 ft/sec with  HHA from therapist    Time 8    Period Weeks    Status New      PT LONG TERM GOAL #5   Title Pt will perform 5x sit <> stand in 18 seconds or less in order to demo decr fall risk and improved functional BLE strength.    Baseline 23.63 seconds with single UE support/none    Time 8    Period Weeks    Status New      Additional Long Term Goals   Additional Long Term Goals Yes      PT LONG TERM GOAL #6   Title Pt will improve FOTO score to at least a 72 in order to demo improved functional outcomes.    Baseline 58    Time 8    Period Weeks    Status New                    Plan - 11/23/20 1125     Clinical Impression Statement Patient is a 87 year old male referred to Neuro OPPT for L CVA on 10/26/20.  Since the CVA has more difficulties walking- now has to hold onto objects or his wife, did not have to use anything to help with walking prior to CVA.  Pt's PMH is significant for: a fib, CVA,HLD, HTN, CAD, CABG (2015) . The following deficits were present during the exam: postural abnormalities, gait abnormalities, decr strength, impaired functional transfers, impaired balance, decr activity tolerance. Pt needing min A and HHA to ambulate as pt is not using an AD. Based on TUG, 5x sit <> stand, and gait speed, pt is an incr risk for  falls. Pt would benefit from skilled PT to address these impairments and functional limitations to maximize functional mobility independence and decr fall risk.    Personal Factors and Comorbidities Comorbidity 3+;Past/Current Experience    Comorbidities a fib, CVA,HLD, HTN, CAD, CABG (2015)    Examination-Activity Limitations Bathing;Dressing;Locomotion Level;Reach Overhead;Squat;Transfers;Stairs;Stand    Examination-Participation Restrictions Community Activity;Occupation;Cleaning    Stability/Clinical Decision Making Evolving/Moderate complexity    Clinical Decision Making Moderate    Rehab Potential Good    PT Frequency 2x / week    PT Duration 12 weeks   16 visits over 12 weeks   PT Treatment/Interventions ADLs/Self Care Home Management;DME Instruction;Gait training;Stair training;Functional mobility training;Therapeutic exercise;Therapeutic activities;Balance training;Neuromuscular re-education;Patient/family education;Orthotic Fit/Training;Vestibular    PT Next Visit Plan perform BERG with goals written. try RW for safe AD. initial HEP for strength/balance.    Consulted and Agree with Plan of Care Patient;Family member/caregiver    Family Member Consulted pt's wife [name]             Patient will benefit from skilled therapeutic intervention in order to improve the following deficits and impairments:  Abnormal gait, Decreased balance, Decreased activity tolerance, Decreased coordination, Decreased endurance, Decreased knowledge of use of DME, Decreased strength, Postural dysfunction  Visit Diagnosis: Unsteadiness on feet  Other abnormalities of gait and mobility  Abnormal posture  Other symptoms and signs involving the nervous system  Muscle weakness (generalized)     Problem List Patient Active Problem List   Diagnosis Date Noted   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   Hematoma of right thigh 11/19/2018   Symptomatic anemia 11/08/2018   Pseudophakia 03/06/2018   S/P  laser cataract surgery 03/06/2018   AMD (age-related macular degeneration), bilateral 02/26/2018   Combined form of senile cataract 02/17/2018   Herpes zoster with complication 04/09/2017  UTI (urinary tract infection) 02/18/2016   Acute on chronic combined systolic and diastolic congestive heart failure (HCC) 02/17/2016   CHF (congestive heart failure) (HCC) 02/17/2016   Gait disturbance 02/17/2016   Near syncope 09/03/2015   Hyponatremia 09/03/2015   Bronchitis 08/29/2015   Encounter for therapeutic drug monitoring 12/12/2013   Anticoagulated on warfarin 12/10/2013   Anticoagulated 12/10/2013   Cardiomyopathy, ischemic- EF 40-45% 12/09/2013   Atrial fibrillation with RVR (HCC) 12/05/2013   CAD- [facility] 12/05/2013   GERD (gastroesophageal reflux disease)    Hyperlipemia    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

## 2020-11-23 ENCOUNTER — Telehealth: Payer: Self-pay | Admitting: Neurology

## 2020-11-23 NOTE — Addendum Note (Signed)
Addended by: Drake Leach on: 11/23/2020 11:34 AM   Modules accepted: Orders

## 2020-11-23 NOTE — Telephone Encounter (Signed)
I called and talk with the wife.  MRA of the neck was relatively unremarkable, only 10 to 20% stenosis of the carotid arteries.  I discussed this with the wife.  He is now engaging in physical therapy.  He is back on blood thinners.    MRA neck 11/23/20:  IMPRESSION: This MR angiogram of the neck arteries shows the following: 1.   10 to 20% stenosis at the origins of the internal carotid arteries.  This is not hemodynamically significant. 2.   50% stenosis of the origin of the left external carotid artery and 80% stenosis at the origin of the right external carotid artery.  The stenosis on the right could be hemodynamically significant but would not be associated with intracranial pathology.

## 2020-12-06 ENCOUNTER — Ambulatory Visit: Payer: Medicare Other | Attending: Neurology

## 2020-12-06 ENCOUNTER — Other Ambulatory Visit: Payer: Self-pay

## 2020-12-06 DIAGNOSIS — R2689 Other abnormalities of gait and mobility: Secondary | ICD-10-CM | POA: Insufficient documentation

## 2020-12-06 DIAGNOSIS — R2681 Unsteadiness on feet: Secondary | ICD-10-CM | POA: Diagnosis not present

## 2020-12-06 DIAGNOSIS — R293 Abnormal posture: Secondary | ICD-10-CM | POA: Diagnosis not present

## 2020-12-06 DIAGNOSIS — M6281 Muscle weakness (generalized): Secondary | ICD-10-CM

## 2020-12-06 NOTE — Therapy (Signed)
[facility] [address] [place], [place], [address] Phone: [phone]   Fax:  [phone]  Physical Therapy Treatment  Patient Details  Name: Edgar Thomas MRN: [id number] Date of Birth: 02/28/1930 Referring Provider (PT): Dr. Anne HahnWillis   Encounter Date: 12/06/2020   PT End of Session - 12/06/20 1446     Visit Number 2    Number of Visits 17    Date for PT Re-Evaluation 02/20/21    Authorization Type BCBS Medicare    PT Start Time 1446    PT Stop Time 1529    PT Time Calculation (min) 43 min    Equipment Utilized During Treatment Gait belt    Activity Tolerance Patient tolerated treatment well    Behavior During Therapy WFL for tasks assessed/performed             Past Medical History:  Diagnosis Date   A-fib (HCC)    Anemia    CAD (coronary artery disease)    Constipation    GERD (gastroesophageal reflux disease)    Heart attack (HCC)    Heartburn    Hyperlipemia    Hypertension    Post herpetic neuralgia    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 LIMA to LAD, sequential SVG to OM 1 & OM 2, SVG to PD;  Surgeon: Delight OvensEdward B Gerhardt, MD;  [facility];  Service: Open Heart Surgery;  Laterality: N/A;   ENDOVEIN HARVEST OF GREATER SAPHENOUS VEIN Right 12/03/2013   Procedure: ENDOVEIN HARVEST OF GREATER SAPHENOUS VEIN;  Surgeon: Delight OvensEdward B Gerhardt, MD;  Location: [facility] OR;  Service: Open Heart Surgery;  Laterality: Right;   INTRAOPERATIVE TRANSESOPHAGEAL  ECHOCARDIOGRAM N/A 12/03/2013   Procedure: INTRAOPERATIVE TRANSESOPHAGEAL ECHOCARDIOGRAM;  Surgeon: Delight OvensEdward B Gerhardt, MD;  [facility];  Service: Open Heart Surgery;  Laterality: N/A;    There were no vitals filed for this visit.   Subjective Assessment - 12/06/20 1448     Subjective Patient reports no new changes/complaints. Patient denies falls. No pain.    Patient is accompained by: Family member   pt's wife [name]   Pertinent History PMH: a fib, CVA,HLD, HTN, CAD, CABG (2015)    Limitations Walking;Standing;House hold activities    Patient Stated Goals wants to improve his balance and walking.    Currently in Pain? No/denies               [facility] Adult [facility] Treatment/Exercise - 12/06/20 0001       Transfers   Transfers Stand to Sit;Sit to Stand    Sit to Stand 4: Min guard    Sit to Stand Details Tactile cues for posture;Verbal cues for sequencing;Verbal cues for precautions/safety;Verbal cues for technique;Verbal cues for safe use of DME/AE    Sit to Stand Details (indicate cue type and reason) cues required for hand placement, as paiten often wanting to push from RW when in front of patient. PT also educating on proper positioning to promote  reduced knee extension into mat as use of stability.    Stand to Sit 4: Min guard    Stand to Sit Details (indicate cue type and reason) Tactile cues for posture;Verbal cues for technique;Verbal cues for sequencing    Stand to Sit Details cues to reach back for mat prior to descent    Number of Reps 10 reps;1 set    Comments completed sit <> stand training x 10 reps, as initiated HEP focusing on improved technique with completion      Ambulation/Gait   Ambulation/Gait Yes    Ambulation/Gait Assistance 4: Min guard;4: Min assist    Ambulation/Gait Assistance Details Patient ambulating into therapy session without AD requiring hand held assist. PT initiating gait training with AD, completed use of RW to promtoe improved safety and  independence w/a ambulation. Patient able to ambulate w/ RW with CGA but did require verbal cues to maintain alignment within device with cues. PT also providing verbal cues for improved step length bilaterally. PT educating on purchase options for RW at this time to patient and wife, as is most beneficial option to promote safety and further reduce risk for falls.    Ambulation Distance (Feet) 230 Feet    Assistive device Rolling walker    Gait Pattern Step-through pattern;Decreased arm swing - right;Decreased arm swing - left;Decreased stride length;Decreased dorsiflexion - right;Decreased dorsiflexion - left;Poor foot clearance - left;Poor foot clearance - right;Trunk flexed    Ambulation Surface Level;Indoor      Standardized Balance Assessment   Standardized Balance Assessment Berg Balance Test      Berg Balance Test   Sit to Stand Able to stand using hands after several tries    Standing Unsupported Able to stand 2 minutes with supervision    Sitting with Back Unsupported but Feet Supported on Floor or Stool Able to sit safely and securely 2 minutes    Stand to Sit Uses backs of legs against chair to control descent    Transfers Able to transfer with verbal cueing and /or supervision    Standing Unsupported with Eyes Closed Able to stand 10 seconds with supervision    Standing Ubsupported with Feet Together Needs help to attain position and unable to hold for 15 seconds    From Standing, Reach Forward with Outstretched Arm Reaches forward but needs supervision    From Standing Position, Pick up Object from Floor Able to pick up shoe, needs supervision    From Standing Position, Turn to Look Behind Over each Shoulder Turn sideways only but maintains balance    Turn 360 Degrees Needs close supervision or verbal cueing    Standing Unsupported, Alternately Place Feet on Step/Stool Able to complete >2 steps/needs minimal assist    Standing Unsupported, One Foot in Front Needs help to step but  can hold 15 seconds    Standing on One Leg Tries to lift leg/unable to hold 3 seconds but remains standing independently    Total Score 26      High Level Balance   High Level Balance Activities Negotitating around obstacles    High Level Balance Comments With use of RW, completed ambulation with working on improved turns around obstalces and maintaining alignment within device, completed 2 x 4 cones. Initial require frequent verbal cues but demo improvement on second trial.             Access Code: [facility] URL: [email or web address] Date: 12/06/2020 Prepared by: Jethro Bastos  Exercises Sit to Stand with Armchair -  1 x daily - 5 x weekly - 2 sets - 10 reps     PT Education - 12/06/20 1549     Education Details need for RW, purchase options. Initial HEP    Person(s) Educated Patient;Spouse    Methods Explanation;Demonstration;Handout    Comprehension Verbalized understanding;Returned demonstration              PT Short Term Goals - 12/06/20 1548       PT SHORT TERM GOAL #1   Title Patient will improve Berg Balance to >/= 30/56 to demonstrate improved balance and reduced fall risk. ALL STGS DUE 12/21/20    Baseline 26/56    Time 4    Period Weeks    Status Revised    Target Date 12/21/20      PT SHORT TERM GOAL #2   Title Pt and pt's spouse will be independent with initial HEP in order to build upon functional gains made in therapy.    Time 4    Period Weeks    Status New      PT SHORT TERM GOAL #3   Title Pt will ambulate at least 32' with appropriate AD and supervision over indoor leel surfaces in order to demo improved household mobility.    Time 4    Period Weeks    Status New      PT SHORT TERM GOAL #4   Title Pt will improve gait speed to at least 2.0 ft/sec with appropriate AD in order to demo decr fall risk.    Baseline 1.73 ft/sec    Time 4    Period Weeks    Status New               PT Long Term Goals - 12/06/20 1549        PT LONG TERM GOAL #1   Title Patient will improve Berg Balance to >/= 35/56 in order to demo decr fall risk. ALL LTGS DUE 01/18/21    Baseline 26/56    Time 8    Period Weeks    Status Revised      PT LONG TERM GOAL #2   Title Pt will decr TUG time to 25 seconds or less with appropriate AD vs. no AD in order to demo decr fall risk.    Baseline 33.22 seconds with single HHA from therapist    Time 8    Period Weeks    Status New      PT LONG TERM GOAL #3   Title Pt will ambulate at least 200' over outdoor unlevel surfaces with LRAD with supervision in order to demo improved community mobility.    Time 8    Period Weeks    Status New      PT LONG TERM GOAL #4   Title Pt will improve gait speed to at least 2.5 ft/sec with appropriate AD in order to demo improved community mobility    Baseline 1.73 ft/sec with HHA from therapist    Time 8    Period Weeks    Status New      PT LONG TERM GOAL #5   Title Pt will perform 5x sit <> stand in 18 seconds or less in order to demo decr fall risk and improved functional BLE strength.    Baseline 23.63 seconds with single UE support/none    Time 8    Period Weeks    Status New      PT LONG TERM GOAL #  6   Title Pt will improve FOTO score to at least a 72 in order to demo improved functional outcomes.    Baseline 58    Time 8    Period Weeks    Status New                   Plan - 12/06/20 1547     Clinical Impression Statement Today's skilled PT session included further balance assesment with Sharlene Motts Balance, patient scoring 26/56 indicating high fall risk. Trialed RW today during session to promote improved safety with ambulation, patient doing well with AD. Did have diffiuclty maintaining alignment in RW initially but demo improvements with practice. Will continue to progress toward all LTGs.    Personal Factors and Comorbidities Comorbidity 3+;Past/Current Experience    Comorbidities a fib, CVA,HLD, HTN, CAD, CABG (2015)     Examination-Activity Limitations Bathing;Dressing;Locomotion Level;Reach Overhead;Squat;Transfers;Stairs;Stand    Examination-Participation Restrictions Community Activity;Occupation;Cleaning    Stability/Clinical Decision Making Evolving/Moderate complexity    Rehab Potential Good    PT Frequency 2x / week    PT Duration 12 weeks   16 visits over 12 weeks   PT Treatment/Interventions ADLs/Self Care Home Management;DME Instruction;Gait training;Stair training;Functional mobility training;Therapeutic exercise;Therapeutic activities;Balance training;Neuromuscular re-education;Patient/family education;Orthotic Fit/Training;Vestibular    PT Next Visit Plan Did we get RW? Continue gait training with AD. Add strength/balance to HEP.    Consulted and Agree with Plan of Care Patient;Family member/caregiver    Family Member Consulted pt's wife [name]             Patient will benefit from skilled therapeutic intervention in order to improve the following deficits and impairments:  Abnormal gait, Decreased balance, Decreased activity tolerance, Decreased coordination, Decreased endurance, Decreased knowledge of use of DME, Decreased strength, Postural dysfunction  Visit Diagnosis: Unsteadiness on feet  Other abnormalities of gait and mobility  Abnormal posture  Muscle weakness (generalized)     Problem List Patient Active Problem List   Diagnosis Date Noted   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   Hematoma of right thigh 11/19/2018   Symptomatic anemia 11/08/2018   Pseudophakia 03/06/2018   S/P laser cataract surgery 03/06/2018   AMD (age-related macular degeneration), bilateral 02/26/2018   Combined form of senile cataract 02/17/2018   Herpes zoster with complication 04/09/2017   UTI (urinary tract infection) 02/18/2016   Acute on chronic combined systolic and diastolic congestive heart failure (HCC) 02/17/2016   CHF (congestive heart failure) (HCC) 02/17/2016   Gait disturbance  02/17/2016   Near syncope 09/03/2015   Hyponatremia 09/03/2015   Bronchitis 08/29/2015   Encounter for therapeutic drug monitoring 12/12/2013   Anticoagulated on warfarin 12/10/2013   Anticoagulated 12/10/2013   Cardiomyopathy, ischemic- EF 40-45% 12/09/2013   Atrial fibrillation with RVR (HCC) 12/05/2013   CAD- last PCI 19 yrs ago at [facility] 12/05/2013   GERD (gastroesophageal reflux disease)    Hyperlipemia    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 [place], [place], [address] Phone: [phone]   Fax:  [phone]  Name: Edgar Thomas MRN: [id number] Date of Birth: 05/16/1930

## 2020-12-06 NOTE — Patient Instructions (Signed)
Access Code: [facility] URL: [email or web address] Date: 12/06/2020 Prepared by: Jethro Bastos  Exercises Sit to Stand with Armchair - 1 x daily - 5 x weekly - 2 sets - 10 reps

## 2020-12-08 ENCOUNTER — Other Ambulatory Visit: Payer: Self-pay

## 2020-12-08 ENCOUNTER — Ambulatory Visit: Payer: Medicare Other

## 2020-12-08 DIAGNOSIS — R293 Abnormal posture: Secondary | ICD-10-CM

## 2020-12-08 DIAGNOSIS — R2689 Other abnormalities of gait and mobility: Secondary | ICD-10-CM | POA: Diagnosis not present

## 2020-12-08 DIAGNOSIS — M6281 Muscle weakness (generalized): Secondary | ICD-10-CM | POA: Diagnosis not present

## 2020-12-08 DIAGNOSIS — R2681 Unsteadiness on feet: Secondary | ICD-10-CM

## 2020-12-08 NOTE — Therapy (Signed)
[facility] [address] [place], [place], [address] Phone: [phone]   Fax:  [phone]  Physical Therapy Treatment  Patient Details  Name: Edgar Thomas MRN: [id number] Date of Birth: May 14, 1930 Referring Provider (PT): Dr. Anne Hahn   Encounter Date: 12/08/2020   PT End of Session - 12/08/20 1455     Visit Number 3    Number of Visits 17    Date for PT Re-Evaluation 02/20/21    Authorization Type BCBS Medicare    PT Start Time 1451   patient arriving late   PT Stop Time 1530    PT Time Calculation (min) 39 min    Equipment Utilized During Treatment Gait belt    Activity Tolerance Patient tolerated treatment well    Behavior During Therapy WFL for tasks assessed/performed             Past Medical History:  Diagnosis Date   A-fib (HCC)    Anemia    CAD (coronary artery disease)    Constipation    GERD (gastroesophageal reflux disease)    Heart attack (HCC)    Heartburn    Hyperlipemia    Hypertension    Post herpetic neuralgia    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 LIMA to LAD, sequential SVG to OM 1 & OM 2, SVG to PD;  Surgeon: Delight Ovens, MD;  Location: [facility] OR;  Service: Open Heart Surgery;  Laterality: N/A;   ENDOVEIN HARVEST OF GREATER SAPHENOUS VEIN Right 12/03/2013   Procedure: ENDOVEIN HARVEST OF GREATER SAPHENOUS VEIN;  Surgeon: Delight Ovens, MD;  Location: [facility] OR;  Service: Open Heart Surgery;  Laterality: Right;    INTRAOPERATIVE TRANSESOPHAGEAL ECHOCARDIOGRAM N/A 12/03/2013   Procedure: INTRAOPERATIVE TRANSESOPHAGEAL ECHOCARDIOGRAM;  Surgeon: Delight Ovens, MD;  Location: [facility] OR;  Service: Open Heart Surgery;  Laterality: N/A;    There were no vitals filed for this visit.   Subjective Assessment - 12/08/20 1455     Subjective Patient reports no new changes. Has RW and brought into session today. No Pain.    Patient is accompained by: Family member   pt's wife [name]   Pertinent History PMH: a fib, CVA,HLD, HTN, CAD, CABG (2015)    Limitations Walking;Standing;House hold activities    Patient Stated Goals wants to improve his balance and walking.    Currently in Pain? No/denies                 [facility] Adult PT Treatment/Exercise - 12/08/20 0001       Transfers   Transfers Stand to Sit;Sit to Stand    Sit to Stand 4: Min guard    Sit to Stand Details Tactile cues for posture;Verbal cues for sequencing;Verbal cues for precautions/safety;Verbal cues for technique;Verbal cues for safe use of DME/AE    Sit to Stand Details (indicate cue type and reason) continued cues for hand placement    Stand to Sit 4: Min guard  Stand to Sit Details (indicate cue type and reason) Tactile cues for posture;Verbal cues for technique;Verbal cues for sequencing      Ambulation/Gait   Ambulation/Gait Yes    Ambulation/Gait Assistance 4: Min guard    Ambulation/Gait Assistance Details Patient brought in person RW to session, PT adjusted to height to ensure posture and then continued gait training x 230 ft working on improved step length bilateral, increased challenge with turn still noted.    Ambulation Distance (Feet) 230 Feet    Assistive device Rolling walker    Gait Pattern Step-through pattern;Decreased arm swing - right;Decreased arm swing - left;Decreased stride length;Decreased dorsiflexion - right;Decreased dorsiflexion - left;Poor foot clearance - left;Poor foot clearance - right;Trunk flexed     Ambulation Surface Level;Indoor      Exercises   Exercises Knee/Hip      Knee/Hip Exercises: Aerobic   Other Aerobic Completed SciFit on Level 2.0 x 5 mintues, cues to keep pace above > 75 for improved endurance/strengthening. Wife asking about  machine, PT educating on purchase options such as [employer]ii for completion at home.      Knee/Hip Exercises: Supine   Bridges Strengthening;Both;1 set;10 reps;Limitations;AROM    Bridges Limitations cues and demonstration for proper completion    Other Supine Knee/Hip Exercises completed supine march x 10 reps bilat, cues for proper technique.                 Balance Exercises - 12/08/20 0001       Balance Exercises: Standing   Standing Eyes Opened Narrow base of support (BOS);Head turns;Solid surface;Limitations   horiz/vertical head turns  x10 reps, CGA. Added vertical head turns to HEP   Standing Eyes Closed Wide (BOA);Solid surface;2 reps;30 secs;Limitations    Standing Eyes Closed Limitations interimttent eye opening, cues to keep eyes closed. supervision closely. PT educating wife to stay close with completion with HEP            Access [email or web address] URL: [email or web address] Date: 12/08/2020 Prepared by: Jethro BastosKaitlyn Christianna Belmonte  Exercises Sit to Stand with Armchair - 1 x daily - 5 x weekly - 2 sets - 10 reps Standing Balance with Eyes Closed - 1 x daily - 5 x weekly - 1 sets - 3 reps - 30 seconds hold Romberg Stance with Head Nods - 1 x daily - 5 x weekly - 1 sets - 10 reps Supine Bridge - 1 x daily - 5 x weekly - 1 sets - 10 reps - 2-3 seconds hold Supine March - 1 x daily - 5 x weekly - 1 sets - 10 reps    PT Education - 12/08/20 1538     Education Details HEP additions    Person(s) Educated Patient;Spouse    Methods Explanation;Demonstration;Handout    Comprehension Verbalized understanding;Returned demonstration              PT Short Term Goals - 12/06/20 1548       PT SHORT TERM GOAL #1   Title  Patient will improve Berg Balance to >/= 30/56 to demonstrate improved balance and reduced fall risk. ALL STGS DUE 12/21/20    Baseline 26/56    Time 4    Period Weeks    Status Revised    Target Date 12/21/20      PT SHORT TERM GOAL #2   Title Pt and pt's spouse will be independent with initial HEP in order to build upon functional gains made in therapy.    Time 4  Period Weeks    Status New      PT SHORT TERM GOAL #3   Title Pt will ambulate at least 32' with appropriate AD and supervision over indoor leel surfaces in order to demo improved household mobility.    Time 4    Period Weeks    Status New      PT SHORT TERM GOAL #4   Title Pt will improve gait speed to at least 2.0 ft/sec with appropriate AD in order to demo decr fall risk.    Baseline 1.73 ft/sec    Time 4    Period Weeks    Status New               PT Long Term Goals - 12/06/20 1549       PT LONG TERM GOAL #1   Title Patient will improve Berg Balance to >/= 35/56 in order to demo decr fall risk. ALL LTGS DUE 01/18/21    Baseline 26/56    Time 8    Period Weeks    Status Revised      PT LONG TERM GOAL #2   Title Pt will decr TUG time to 25 seconds or less with appropriate AD vs. no AD in order to demo decr fall risk.    Baseline 33.22 seconds with single HHA from therapist    Time 8    Period Weeks    Status New      PT LONG TERM GOAL #3   Title Pt will ambulate at least 200' over outdoor unlevel surfaces with LRAD with supervision in order to demo improved community mobility.    Time 8    Period Weeks    Status New      PT LONG TERM GOAL #4   Title Pt will improve gait speed to at least 2.5 ft/sec with appropriate AD in order to demo improved community mobility    Baseline 1.73 ft/sec with HHA from therapist    Time 8    Period Weeks    Status New      PT LONG TERM GOAL #5   Title Pt will perform 5x sit <> stand in 18 seconds or less in order to demo decr fall risk and improved  functional BLE strength.    Baseline 23.63 seconds with single UE support/none    Time 8    Period Weeks    Status New      PT LONG TERM GOAL #6   Title Pt will improve FOTO score to at least a 72 in order to demo improved functional outcomes.    Baseline 58    Time 8    Period Weeks    Status New                   Plan - 12/08/20 1539     Clinical Impression Statement Continued gait training iwth patient's personal RW brought into session with patient continue to require cues for step length but able to ambulate more independent vs. without AD. Continued session foucsed on continued balance exercises and strengthening exercises to further add to HEP. Will continue to progress toward all LTGs.    Personal Factors and Comorbidities Comorbidity 3+;Past/Current Experience    Comorbidities a fib, CVA,HLD, HTN, CAD, CABG (2015)    Examination-Activity Limitations Bathing;Dressing;Locomotion Level;Reach Overhead;Squat;Transfers;Stairs;Stand    Examination-Participation Restrictions Community Activity;Occupation;Cleaning    Stability/Clinical Decision Making Evolving/Moderate complexity    Rehab Potential Good    PT  Frequency 2x / week    PT Duration 12 weeks   16 visits over 12 weeks   PT Treatment/Interventions ADLs/Self Care Home Management;DME Instruction;Gait training;Stair training;Functional mobility training;Therapeutic exercise;Therapeutic activities;Balance training;Neuromuscular re-education;Patient/family education;Orthotic Fit/Training;Vestibular    PT Next Visit Plan How was HEP additions? Continue gait training with AD. Continue supine strengthening. NuStep/SciFit as warmup. Continue static balance    Consulted and Agree with Plan of Care Patient;Family member/caregiver    Family Member Consulted pt's wife [name]             Patient will benefit from skilled therapeutic intervention in order to improve the following deficits and impairments:  Abnormal gait,  Decreased balance, Decreased activity tolerance, Decreased coordination, Decreased endurance, Decreased knowledge of use of DME, Decreased strength, Postural dysfunction  Visit Diagnosis: Unsteadiness on feet  Other abnormalities of gait and mobility  Muscle weakness (generalized)  Abnormal posture     Problem List Patient Active Problem List   Diagnosis Date Noted   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   Hematoma of right thigh 11/19/2018   Symptomatic anemia 11/08/2018   Pseudophakia 03/06/2018   S/P laser cataract surgery 03/06/2018   AMD (age-related macular degeneration), bilateral 02/26/2018   Combined form of senile cataract 02/17/2018   Herpes zoster with complication 04/09/2017   UTI (urinary tract infection) 02/18/2016   Acute on chronic combined systolic and diastolic congestive heart failure (HCC) 02/17/2016   CHF (congestive heart failure) (HCC) 02/17/2016   Gait disturbance 02/17/2016   Near syncope 09/03/2015   Hyponatremia 09/03/2015   Bronchitis 08/29/2015   Encounter for therapeutic drug monitoring 12/12/2013   Anticoagulated on warfarin 12/10/2013   Anticoagulated 12/10/2013   Cardiomyopathy, ischemic- EF 40-45% 12/09/2013   Atrial fibrillation with RVR (HCC) 12/05/2013   CAD- last PCI [facility] yrs ago at [facility] 12/05/2013   GERD (gastroesophageal reflux disease)    Hyperlipemia    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 [place], [place], [address] Phone: [phone]   Fax:  [phone]  Name: Caeleb Batalla MRN: [id number] Date of Birth: 1929-11-19

## 2020-12-08 NOTE — Patient Instructions (Signed)
Access Code: [facility] URL: [email or web address] Date: 12/08/2020 Prepared by: Jethro Bastos  Exercises Sit to Stand with Armchair - 1 x daily - 5 x weekly - 2 sets - 10 reps Standing Balance with Eyes Closed - 1 x daily - 5 x weekly - 1 sets - 3 reps - 30 seconds hold Romberg Stance with Head Nods - 1 x daily - 5 x weekly - 1 sets - 10 reps Supine Bridge - 1 x daily - 5 x weekly - 1 sets - 10 reps - 2-3 seconds hold Supine March - 1 x daily - 5 x weekly - 1 sets - 10 reps

## 2020-12-13 DIAGNOSIS — Z77122 Contact with and (suspected) exposure to noise: Secondary | ICD-10-CM | POA: Diagnosis not present

## 2020-12-13 DIAGNOSIS — J342 Deviated nasal septum: Secondary | ICD-10-CM | POA: Diagnosis not present

## 2020-12-13 DIAGNOSIS — H903 Sensorineural hearing loss, bilateral: Secondary | ICD-10-CM | POA: Diagnosis not present

## 2020-12-13 DIAGNOSIS — H9190 Unspecified hearing loss, unspecified ear: Secondary | ICD-10-CM | POA: Diagnosis not present

## 2020-12-14 ENCOUNTER — Ambulatory Visit: Payer: Medicare Other | Admitting: Physical Therapy

## 2020-12-14 ENCOUNTER — Other Ambulatory Visit: Payer: Self-pay

## 2020-12-14 ENCOUNTER — Encounter: Payer: Self-pay | Admitting: Physical Therapy

## 2020-12-14 VITALS — BP 144/86 | HR 102

## 2020-12-14 DIAGNOSIS — I2510 Atherosclerotic heart disease of native coronary artery without angina pectoris: Secondary | ICD-10-CM | POA: Diagnosis not present

## 2020-12-14 DIAGNOSIS — R2681 Unsteadiness on feet: Secondary | ICD-10-CM

## 2020-12-14 DIAGNOSIS — E782 Mixed hyperlipidemia: Secondary | ICD-10-CM | POA: Diagnosis not present

## 2020-12-14 DIAGNOSIS — M6281 Muscle weakness (generalized): Secondary | ICD-10-CM | POA: Diagnosis not present

## 2020-12-14 DIAGNOSIS — I10 Essential (primary) hypertension: Secondary | ICD-10-CM | POA: Diagnosis not present

## 2020-12-14 DIAGNOSIS — R293 Abnormal posture: Secondary | ICD-10-CM | POA: Diagnosis not present

## 2020-12-14 DIAGNOSIS — Z8673 Personal history of transient ischemic attack (TIA), and cerebral infarction without residual deficits: Secondary | ICD-10-CM | POA: Diagnosis not present

## 2020-12-14 DIAGNOSIS — R2689 Other abnormalities of gait and mobility: Secondary | ICD-10-CM | POA: Diagnosis not present

## 2020-12-14 DIAGNOSIS — D649 Anemia, unspecified: Secondary | ICD-10-CM | POA: Diagnosis not present

## 2020-12-14 DIAGNOSIS — R600 Localized edema: Secondary | ICD-10-CM | POA: Diagnosis not present

## 2020-12-14 NOTE — Patient Instructions (Signed)
Warning Signs of a Stroke A stroke is a medical emergency and should be treated right away--every second counts. A stroke is caused by a decrease or block in blood flow to the brain. When certain areas of the brain do not get enough oxygen, brain cells begin todie. A stroke can lead to brain damage and can sometimes be life-threatening. However, if a person gets medical treatment right away, he or she has a better chance of surviving and recovering from a stroke. It is very important to beable to recognize the symptoms of a stroke. Types of strokes There are two main types of strokes: Ischemic stroke. This is the most common type. This stroke happens when a blood vessel that supplies blood to the brain is blocked. Hemorrhagic stroke. This results from bleeding in the brain when a blood vessel leaks or bursts (ruptures). A transient ischemic attack (TIA) causes stroke-like symptoms that go away quickly. Unlike a stroke, a TIA does not cause permanent damage to the brain. However, the symptoms of a TIA are the same as a stroke. TIAs also require medical treatment right away. Having a TIA is a sign that you are at higherrisk for a stroke. Warning signs of a stroke The symptoms of stroke may vary and will reflect the part of the brain that is involved. Symptoms usually happen suddenly. "BE FAST" is an easy way to remember the main warning signs of a stroke: B - Balance. Signs are dizziness, sudden trouble walking, or loss of balance. E - Eyes. Signs are trouble seeing or a sudden change in vision. F - Face. Signs are sudden weakness or numbness of the face, or the face or eyelid drooping on one side. A - Arms. Signs are weakness or numbness in an arm. This happens suddenly and usually on one side of the body. S - Speech. Signs are sudden trouble speaking, slurred speech, or trouble understanding what people say. T - Time. Time to call emergency services. Write down what time symptoms started. Other signs  of a stroke Some less common signs of a stroke include: A sudden, severe headache with no known cause. Nausea or vomiting. Seizure. A stroke may be happening even if only one "BE FAST" symptom is present. These symptoms may represent a serious problem that is an emergency. Do not wait to see if the symptoms will go away. Get medical help right away. Call your local emergency services (911 in the [place] Do not drive yourself to the hospital. Summary A stroke is a medical emergency and should be treated right away--every second counts. "BE FAST" is an easy way to remember the main warning signs of a stroke. Call your local emergency services right away if you or someone else has any stroke symptoms, even if the symptoms go away. Make note of what time the first symptoms appeared. Emergency responders or emergency room staff will need to know this information. Do not wait to see if symptoms will go away. Call 911 even if only one of the "BE FAST" symptoms appears. This information is not intended to replace advice given to you by your health care provider. Make sure you discuss any questions you have with your healthcare provider. Document Revised: 12/15/2019 Document Reviewed: 12/15/2019 Elsevier Patient Education  2022 [employer]  

## 2020-12-14 NOTE — Therapy (Addendum)
[facility] [address] [place], [place], [address] Phone: [phone]   Fax:  [phone]  Physical Therapy Treatment  Patient Details  Name: Edgar Thomas MRN: [id number] Date of Birth: [birth year] Referring Provider (PT): Dr. Anne Hahn   Encounter Date: 12/14/2020   PT End of Session - 12/14/20 1724     Visit Number 4    Number of Visits 17    Date for PT Re-Evaluation 02/20/21    Authorization Type BCBS Medicare    PT Start Time 1533    PT Stop Time 1618    PT Time Calculation (min) 45 min    Equipment Utilized During Treatment Gait belt    Activity Tolerance Patient tolerated treatment well    Behavior During Therapy WFL for tasks assessed/performed             Past Medical History:  Diagnosis Date   A-fib (HCC)    Anemia    CAD (coronary artery disease)    Constipation    GERD (gastroesophageal reflux disease)    Heart attack (HCC)    Heartburn    Hyperlipemia    Hypertension    Post herpetic neuralgia    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 LIMA to LAD, sequential SVG to OM 1 & OM 2, SVG to PD;  Surgeon: Delight Ovens, MD;  Location: [facility] OR;  Service: Open Heart Surgery;  Laterality: N/A;   ENDOVEIN HARVEST OF GREATER SAPHENOUS VEIN Right 12/03/2013   Procedure: ENDOVEIN HARVEST OF GREATER SAPHENOUS VEIN;  Surgeon: Delight Ovens, MD;  Location: [facility] OR;  Service: Open Heart Surgery;  Laterality: Right;   INTRAOPERATIVE TRANSESOPHAGEAL  ECHOCARDIOGRAM N/A 12/03/2013   Procedure: INTRAOPERATIVE TRANSESOPHAGEAL ECHOCARDIOGRAM;  Surgeon: Delight Ovens, MD;  Location: [facility] OR;  Service: Open Heart Surgery;  Laterality: N/A;    Vitals:   12/14/20 1540 12/14/20 1546 12/14/20 1607  BP: (!) 137/108 (!) 138/94 (!) 144/86  Pulse: (!) 102       Subjective Assessment - 12/14/20 1538     Subjective Almost had a fall in the parking lot. right leg almost gave out. Pt's spouse had to hold on to him to prevent from falling.    Patient is accompained by: Family member   pt's wife Edgar Thomas   Pertinent History PMH: a fib, CVA,HLD, HTN, CAD, CABG (2015)    Limitations Walking;Standing;House hold activities    Patient Stated Goals wants to improve his balance and walking.    Currently in Pain? No/denies                               [facility] Adult PT Treatment/Exercise - 12/14/20 1553       Transfers   Transfers Stand to Sit;Sit to Stand    Sit to Stand 4: Min guard    Sit to Stand Details Tactile cues for posture;Verbal cues for sequencing;Verbal cues for  precautions/safety;Verbal cues for technique;Verbal cues for safe use of DME/AE    Sit to Stand Details (indicate cue type and reason) cues for proper hand placement    Stand to Sit 4: Min guard    Stand to Sit Details cues to reach back for mat prior to descent    Number of Reps 1 set;Other reps (comment)   5 reps   Comments cues for proper sequencing; scooting out towards edge, foot placement, and incr anterior weight shift to stand.      Ambulation/Gait   Ambulation/Gait Yes    Ambulation/Gait Assistance 4: Min guard    Ambulation/Gait Assistance Details cues for step length and foot clearance with gait, esp with RLE as incr suffing noted    Ambulation Distance (Feet) 115 Feet   x2 - into and out of clinic   Assistive device Rolling walker    Gait Pattern Step-through pattern;Decreased arm swing - right;Decreased arm swing - left;Decreased stride length;Decreased  dorsiflexion - right;Decreased dorsiflexion - left;Poor foot clearance - left;Poor foot clearance - right;Trunk flexed    Ambulation Surface Level;Indoor      Therapeutic Activites    Therapeutic Activities Other Therapeutic Activities    Other Therapeutic Activities provided education on warning signs/sx of a CVA and provided handout to pt's spouse, educated spouse where to purchase a gait belt off [employer] to use when walking outdoors for incr safety (due to pt almost having a fall in parking lot with RW) and to be on pt's R weaker side      Exercises   Exercises Other Exercises    Other Exercises  seated heel <> toe raises: 2 x 10 reps with verbal/demo cues, incr difficulty with heel raises      Knee/Hip Exercises: Seated   Long Arc Quad Strengthening;AROM;1 set;Both;10 reps    Long Arc Quad Limitations cues for full ROM, posture and isometric hold    Marching Strengthening;AROM;Both;2 sets;Other (comment)    Marching Limitations 7 reps B, visual cue to tap therapist's hand with RLE for full ROM    Abduction/Adduction  Strengthening;Both;2 sets;10 reps;Limitations    Abd/Adduction Limitations ball squeezes with 3 second hold 2 x 10 reps. seated hip ABD with yellow tband resistance performed B x10 reps, then with RLE only x10 reps                    PT Education - 12/14/20 1723     Education Details see TA    Person(s) Educated Patient;Spouse    Methods Explanation;Handout    Comprehension Verbalized understanding              PT Short Term Goals - 12/06/20 1548       PT SHORT TERM GOAL #1   Title Patient will improve Berg Balance to >/= 30/56 to demonstrate improved balance and reduced fall risk. ALL STGS DUE 12/21/20    Baseline 26/56    Time 4    Period Weeks    Status Revised    Target Date 12/21/20      PT SHORT TERM GOAL #2   Title Pt and pt's spouse will be independent with initial HEP in order to build upon functional gains made in therapy.    Time 4     Period Weeks    Status New      PT SHORT TERM GOAL #3   Title Pt will ambulate at least 52' with appropriate AD and supervision over indoor leel surfaces in order  to demo improved household mobility.    Time 4    Period Weeks    Status New      PT SHORT TERM GOAL #4   Title Pt will improve gait speed to at least 2.0 ft/sec with appropriate AD in order to demo decr fall risk.    Baseline 1.73 ft/sec    Time 4    Period Weeks    Status New               PT Long Term Goals - 12/06/20 1549       PT LONG TERM GOAL #1   Title Patient will improve Berg Balance to >/= 35/56 in order to demo decr fall risk. ALL LTGS DUE 01/18/21    Baseline 26/56    Time 8    Period Weeks    Status Revised      PT LONG TERM GOAL #2   Title Pt will decr TUG time to 25 seconds or less with appropriate AD vs. no AD in order to demo decr fall risk.    Baseline 33.22 seconds with single HHA from therapist    Time 8    Period Weeks    Status New      PT LONG TERM GOAL #3   Title Pt will ambulate at least 200' over outdoor unlevel surfaces with LRAD with supervision in order to demo improved community mobility.    Time 8    Period Weeks    Status New      PT LONG TERM GOAL #4   Title Pt will improve gait speed to at least 2.5 ft/sec with appropriate AD in order to demo improved community mobility    Baseline 1.73 ft/sec with HHA from therapist    Time 8    Period Weeks    Status New      PT LONG TERM GOAL #5   Title Pt will perform 5x sit <> stand in 18 seconds or less in order to demo decr fall risk and improved functional BLE strength.    Baseline 23.63 seconds with single UE support/none    Time 8    Period Weeks    Status New      PT LONG TERM GOAL #6   Title Pt will improve FOTO score to at least a 72 in order to demo improved functional outcomes.    Baseline 58    Time 8    Period Weeks    Status New                   Plan - 12/14/20 1827     Clinical  Impression Statement When checked automatically at beginning of session, pt's diastolic BP value was elevated. When manually checked, pt's BP WFL for therapy. Today's skilled session focused on seated BLE strengthening and gait training with walker. Pt needing verbal, demo and tactile cues for seated exercises today. Needs continued cues throughout gait for incr step length with RLE. Will continue to progress towards LTGs.    Personal Factors and Comorbidities Comorbidity 3+;Past/Current Experience    Comorbidities a fib, CVA,HLD, HTN, CAD, CABG (2015)    Examination-Activity Limitations Bathing;Dressing;Locomotion Level;Reach Overhead;Squat;Transfers;Stairs;Stand    Examination-Participation Restrictions Community Activity;Occupation;Cleaning    Stability/Clinical Decision Making Evolving/Moderate complexity    Rehab Potential Good    PT Frequency 2x / week    PT Duration 12 weeks   16 visits over 12 weeks   PT Treatment/Interventions ADLs/Self Care Home  Management;DME Instruction;Gait training;Stair training;Functional mobility training;Therapeutic exercise;Therapeutic activities;Balance training;Neuromuscular re-education;Patient/family education;Orthotic Fit/Training;Vestibular    PT Next Visit Plan monitor BP. Continue gait training with AD. Continue supine and seated strengthening. NuStep/SciFit as warmup. Continue static balance    Consulted and Agree with Plan of Care Patient;Family member/caregiver    Family Member Consulted pt's wife [name]             Patient will benefit from skilled therapeutic intervention in order to improve the following deficits and impairments:  Abnormal gait, Decreased balance, Decreased activity tolerance, Decreased coordination, Decreased endurance, Decreased knowledge of use of DME, Decreased strength, Postural dysfunction  Visit Diagnosis: Unsteadiness on feet  Other abnormalities of gait and mobility  Muscle weakness (generalized)     Problem  List Patient Active Problem List   Diagnosis Date Noted   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   Hematoma of right thigh 11/19/2018   Symptomatic anemia 11/08/2018   Pseudophakia 03/06/2018   S/P laser cataract surgery 03/06/2018   AMD (age-related macular degeneration), bilateral 02/26/2018   Combined form of senile cataract 02/17/2018   Herpes zoster with complication 04/09/2017   UTI (urinary tract infection) 02/18/2016   Acute on chronic combined systolic and diastolic congestive heart failure (HCC) 02/17/2016   CHF (congestive heart failure) (HCC) 02/17/2016   Gait disturbance 02/17/2016   Near syncope 09/03/2015   Hyponatremia 09/03/2015   Bronchitis 08/29/2015   Encounter for therapeutic drug monitoring 12/12/2013   Anticoagulated on warfarin 12/10/2013   Anticoagulated 12/10/2013   Cardiomyopathy, ischemic- EF 40-45% 12/09/2013   Atrial fibrillation with RVR (HCC) 12/05/2013   CAD- last PCI [facility] yrs ago at [facility] 12/05/2013   GERD (gastroesophageal reflux disease)    Hyperlipemia    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 [place], [place], [address] Phone: [phone]   Fax:  [phone]  Name: Edgar Thomas MRN: [id number] Date of Birth: [birth year]

## 2020-12-16 ENCOUNTER — Other Ambulatory Visit: Payer: Self-pay

## 2020-12-16 ENCOUNTER — Ambulatory Visit: Payer: Medicare Other | Admitting: Physical Therapy

## 2020-12-16 ENCOUNTER — Encounter: Payer: Self-pay | Admitting: Physical Therapy

## 2020-12-16 VITALS — BP 170/80

## 2020-12-16 DIAGNOSIS — R293 Abnormal posture: Secondary | ICD-10-CM | POA: Diagnosis not present

## 2020-12-16 DIAGNOSIS — M6281 Muscle weakness (generalized): Secondary | ICD-10-CM | POA: Diagnosis not present

## 2020-12-16 DIAGNOSIS — R2689 Other abnormalities of gait and mobility: Secondary | ICD-10-CM

## 2020-12-16 DIAGNOSIS — R2681 Unsteadiness on feet: Secondary | ICD-10-CM | POA: Diagnosis not present

## 2020-12-16 NOTE — Therapy (Signed)
[facility] [address] [place], [place], [address] Phone: [phone]   Fax:  [phone]  Physical Therapy Treatment  Patient Details  Name: Edgar ChurchBilly Thomas MRN: [id number] Date of Birth: 07/25/1929 Referring Provider (PT): Dr. Anne HahnWillis   Encounter Date: 12/16/2020   PT End of Session - 12/16/20 1458     Visit Number 5    Number of Visits 17    Date for PT Re-Evaluation 02/20/21    Authorization Type BCBS Medicare    PT Start Time 1456   pt running late for appt today   PT Stop Time 1530    PT Time Calculation (min) 34 min    Equipment Utilized During Treatment Gait belt    Activity Tolerance Patient tolerated treatment well    Behavior During Therapy WFL for tasks assessed/performed             Past Medical History:  Diagnosis Date   A-fib (HCC)    Anemia    CAD (coronary artery disease)    Constipation    GERD (gastroesophageal reflux disease)    Heart attack (HCC)    Heartburn    Hyperlipemia    Hypertension    Post herpetic neuralgia    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 LIMA to LAD, sequential SVG to OM 1 & OM 2, SVG to PD;  Surgeon: Delight OvensEdward B Gerhardt, MD;  [facility];  Service: Open Heart Surgery;  Laterality: N/A;   ENDOVEIN HARVEST OF GREATER SAPHENOUS VEIN Right 12/03/2013   Procedure: ENDOVEIN HARVEST OF GREATER SAPHENOUS VEIN;  Surgeon: Delight OvensEdward B Gerhardt, MD;  Location: [facility] OR;  Service: Open Heart Surgery;  Laterality: Right;    INTRAOPERATIVE TRANSESOPHAGEAL ECHOCARDIOGRAM N/A 12/03/2013   Procedure: INTRAOPERATIVE TRANSESOPHAGEAL ECHOCARDIOGRAM;  Surgeon: Delight OvensEdward B Gerhardt, MD;  [facility];  Service: Open Heart Surgery;  Laterality: N/A;    Vitals:   12/16/20 1459 12/16/20 1529  BP: (!) 170/88 (!) 170/80     Subjective Assessment - 12/16/20 1458     Subjective No new complaints. No falls or pain to report.    Patient is accompained by: Family member   [name]   Pertinent History PMH: a fib, CVA,HLD, HTN, CAD, CABG (2015)    Limitations Walking;Standing;House hold activities    Patient Stated Goals wants to improve his balance and walking.    Currently in Pain? No/denies                   [facility] Adult PT Treatment/Exercise - 12/16/20 1502       Transfers   Transfers Stand to Sit;Sit to Stand    Sit to Stand 4: Min guard;With upper extremity assist;From bed;From chair/3-in-1    Sit to Stand Details Tactile cues for posture;Verbal cues for sequencing;Verbal cues for precautions/safety;Verbal cues for technique;Verbal cues for safe use of DME/AE    Sit to Stand Details (indicate cue type and reason) cues for hand and foot  placement each time, to scoot closer to the edge when seated on mat table/chair    Stand to Sit 4: Min guard;With upper extremity assist;To bed;To chair/3-in-1    Stand to Sit Details cues to turn fully to surface prior to sitting and to reach back      Ambulation/Gait   Ambulation/Gait Yes    Ambulation/Gait Assistance 4: Min guard    Ambulation/Gait Assistance Details cues for increased step length bil, to stay within walker (especially with turns) and for posture.    Ambulation Distance (Feet) 115 Feet   x1, plus into/out of gym   Assistive device Rolling walker    Gait Pattern Step-through pattern;Decreased arm swing - right;Decreased arm swing - left;Decreased stride length;Decreased dorsiflexion - right;Decreased dorsiflexion - left;Poor foot clearance - left;Poor foot  clearance - right;Trunk flexed    Ambulation Surface Level;Indoor      Neuro Re-ed    Neuro Re-ed Details  for balance/NMR: standing with UE support on walker (use of clinic walker as it's open in the middle)- wtih yard stick on floor worked on alternating stepping over/back for 10 reps each side with cues for increased step length both ways so "not to step on the yard stick" with min guard to min assist for balance; then with 4 inch box worked on alternating foot taps for 10 reps each side with cues for increased hip/knee flexion with min guard to min assist for balance.      Knee/Hip Exercises: Aerobic   Other Aerobic Completed SciFit on Level 2.0 x 5 mintues, cues to keep pace above > 75 for improved endurance/strengthening.      Knee/Hip Exercises: Seated   Long Arc Quad Strengthening;AROM;1 set;Both;10 reps;Weights;Limitations    Long Arc Quad Weight 2 lbs.    Long [employer] Limitations cues for full ROM, posture and isometric hold    Marching Strengthening;AROM;Both;1 set;10 reps;Weights;Limitations    Marching Limitations use of PTA hand as target to promote increased hip flexion                 Balance Exercises - 12/16/20 1522       Balance Exercises: Standing   Standing Eyes Opened Wide (BOA);Solid surface;Head turns;Other reps (comment);Limitations    Standing Eyes Opened Limitations on floor for a few reps of left<>right head turns, progressing to EC (see below). min guard assist.    Standing Eyes Closed Wide (BOA);Head turns;Solid surface;Other reps (comment);30 secs;Limitations                 PT Short Term Goals - 12/06/20 1548       PT SHORT TERM GOAL #1   Title Patient will improve Berg Balance to >/= 30/56 to demonstrate improved balance and reduced fall risk. ALL STGS DUE 12/21/20    Baseline 26/56    Time 4    Period Weeks    Status Revised    Target Date 12/21/20      PT SHORT TERM GOAL #2   Title Pt and pt's spouse will be independent with  initial HEP in order to build upon functional gains made in therapy.    Time 4    Period Weeks    Status New      PT SHORT TERM GOAL #3   Title Pt will ambulate at least 53' with appropriate AD and supervision over indoor leel surfaces in order to demo improved household mobility.    Time 4    Period Weeks  Status New      PT SHORT TERM GOAL #4   Title Pt will improve gait speed to at least 2.0 ft/sec with appropriate AD in order to demo decr fall risk.    Baseline 1.73 ft/sec    Time 4    Period Weeks    Status New               PT Long Term Goals - 12/06/20 1549       PT LONG TERM GOAL #1   Title Patient will improve Berg Balance to >/= 35/56 in order to demo decr fall risk. ALL LTGS DUE 01/18/21    Baseline 26/56    Time 8    Period Weeks    Status Revised      PT LONG TERM GOAL #2   Title Pt will decr TUG time to 25 seconds or less with appropriate AD vs. no AD in order to demo decr fall risk.    Baseline 33.22 seconds with single HHA from therapist    Time 8    Period Weeks    Status New      PT LONG TERM GOAL #3   Title Pt will ambulate at least 200' over outdoor unlevel surfaces with LRAD with supervision in order to demo improved community mobility.    Time 8    Period Weeks    Status New      PT LONG TERM GOAL #4   Title Pt will improve gait speed to at least 2.5 ft/sec with appropriate AD in order to demo improved community mobility    Baseline 1.73 ft/sec with HHA from therapist    Time 8    Period Weeks    Status New      PT LONG TERM GOAL #5   Title Pt will perform 5x sit <> stand in 18 seconds or less in order to demo decr fall risk and improved functional BLE strength.    Baseline 23.63 seconds with single UE support/none    Time 8    Period Weeks    Status New      PT LONG TERM GOAL #6   Title Pt will improve FOTO score to at least a 72 in order to demo improved functional outcomes.    Baseline 58    Time 8    Period Weeks    Status  New                   Plan - 12/16/20 1458     Clinical Impression Statement Today's skilled session continued to focus on strengthening, gait with RW and standing balance acitivities. No issues reported or noted in session. Continues to need cues for safety and step length with gait. Noted 1 episode of right knee buckling with gait out of session with pt self correcting with min guard assist. Pt and spouse report this happens at times. The pt is making progress and should benefit from continued PT to progress toward unmet goals.    Personal Factors and Comorbidities Comorbidity 3+;Past/Current Experience    Comorbidities a fib, CVA,HLD, HTN, CAD, CABG (2015)    Examination-Activity Limitations Bathing;Dressing;Locomotion Level;Reach Overhead;Squat;Transfers;Stairs;Stand    Examination-Participation Restrictions Community Activity;Occupation;Cleaning    Stability/Clinical Decision Making Evolving/Moderate complexity    Rehab Potential Good    PT Frequency 2x / week    PT Duration 12 weeks   16 visits over 12 weeks   PT Treatment/Interventions ADLs/Self Care Home Management;DME Instruction;Gait training;Stair  training;Functional mobility training;Therapeutic exercise;Therapeutic activities;Balance training;Neuromuscular re-education;Patient/family education;Orthotic Fit/Training;Vestibular    PT Next Visit Plan monitor BP. Continue gait training with AD. Continue supine and seated strengthening. NuStep/SciFit as warmup. Continue static balance    Consulted and Agree with Plan of Care Patient;Family member/caregiver    Family Member Consulted pt's wife [name]             Patient will benefit from skilled therapeutic intervention in order to improve the following deficits and impairments:  Abnormal gait, Decreased balance, Decreased activity tolerance, Decreased coordination, Decreased endurance, Decreased knowledge of use of DME, Decreased strength, Postural dysfunction  Visit  Diagnosis: Unsteadiness on feet  Other abnormalities of gait and mobility  Muscle weakness (generalized)     Problem List Patient Active Problem List   Diagnosis Date Noted   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   Hematoma of right thigh 11/19/2018   Symptomatic anemia 11/08/2018   Pseudophakia 03/06/2018   S/P laser cataract surgery 03/06/2018   AMD (age-related macular degeneration), bilateral 02/26/2018   Combined form of senile cataract 02/17/2018   Herpes zoster with complication 04/09/2017   UTI (urinary tract infection) 02/18/2016   Acute on chronic combined systolic and diastolic congestive heart failure (HCC) 02/17/2016   CHF (congestive heart failure) (HCC) 02/17/2016   Gait disturbance 02/17/2016   Near syncope 09/03/2015   Hyponatremia 09/03/2015   Bronchitis 08/29/2015   Encounter for therapeutic drug monitoring 12/12/2013   Anticoagulated on warfarin 12/10/2013   Anticoagulated 12/10/2013   Cardiomyopathy, ischemic- EF 40-45% 12/09/2013   Atrial fibrillation with RVR (HCC) 12/05/2013   CAD- last PCI 19 yrs ago at [facility] 12/05/2013   GERD (gastroesophageal reflux disease)    Hyperlipemia    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

## 2020-12-20 ENCOUNTER — Other Ambulatory Visit: Payer: Self-pay

## 2020-12-20 ENCOUNTER — Encounter: Payer: Self-pay | Admitting: Physical Therapy

## 2020-12-20 ENCOUNTER — Ambulatory Visit: Payer: Medicare Other | Admitting: Physical Therapy

## 2020-12-20 VITALS — BP 185/90

## 2020-12-20 DIAGNOSIS — R2689 Other abnormalities of gait and mobility: Secondary | ICD-10-CM

## 2020-12-20 DIAGNOSIS — R2681 Unsteadiness on feet: Secondary | ICD-10-CM | POA: Diagnosis not present

## 2020-12-20 DIAGNOSIS — R293 Abnormal posture: Secondary | ICD-10-CM | POA: Diagnosis not present

## 2020-12-20 DIAGNOSIS — M6281 Muscle weakness (generalized): Secondary | ICD-10-CM

## 2020-12-20 NOTE — Therapy (Signed)
[facility] [address] [place], [place], [address] Phone: [phone]   Fax:  [phone]  Physical Therapy Treatment  Patient Details  Name: Edgar Thomas MRN: [id number] Date of Birth: 10/19/1929 Referring Provider (PT): Dr. Anne HahnWillis   Encounter Date: 12/20/2020   PT End of Session - 12/20/20 1445     Visit Number 6    Number of Visits 17    Date for PT Re-Evaluation 02/20/21    Authorization Type BCBS Medicare    PT Start Time 1403    PT Stop Time 1444    PT Time Calculation (min) 41 min    Equipment Utilized During Treatment Gait belt    Activity Tolerance Patient tolerated treatment well    Behavior During Therapy WFL for tasks assessed/performed             Past Medical History:  Diagnosis Date   A-fib (HCC)    Anemia    CAD (coronary artery disease)    Constipation    GERD (gastroesophageal reflux disease)    Heart attack (HCC)    Heartburn    Hyperlipemia    Hypertension    Post herpetic neuralgia    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 LIMA to LAD, sequential SVG to OM 1 & OM 2, SVG to PD;  Surgeon: Delight OvensEdward B Gerhardt, MD;  [facility];  Service: Open Heart Surgery;  Laterality: N/A;   ENDOVEIN HARVEST OF GREATER SAPHENOUS VEIN Right 12/03/2013   Procedure: ENDOVEIN HARVEST OF GREATER SAPHENOUS VEIN;  Surgeon: Delight OvensEdward B Gerhardt, MD;  Location: [facility] OR;  Service: Open Heart Surgery;  Laterality: Right;   INTRAOPERATIVE TRANSESOPHAGEAL  ECHOCARDIOGRAM N/A 12/03/2013   Procedure: INTRAOPERATIVE TRANSESOPHAGEAL ECHOCARDIOGRAM;  Surgeon: Delight OvensEdward B Gerhardt, MD;  [facility];  Service: Open Heart Surgery;  Laterality: N/A;    Vitals:   12/20/20 1408 12/20/20 1435  BP: (!) 170/88 (!) 185/90     Subjective Assessment - 12/20/20 1406     Subjective No changes, no almost falls.    Patient is accompained by: Family member   spouse [name]   Pertinent History PMH: a fib, CVA,HLD, HTN, CAD, CABG (2015)    Limitations Walking;Standing;House hold activities    Patient Stated Goals wants to improve his balance and walking.    Currently in Pain? No/denies                               [facility] Adult [facility] Treatment/Exercise - 12/20/20 1436       Transfers   Transfers Stand to Sit;Sit to Stand    Sit to Stand 4: Min guard;With upper extremity assist;From bed;From chair/3-in-1    Sit to Stand Details Tactile cues for posture;Verbal cues for sequencing;Verbal cues for precautions/safety;Verbal cues for technique;Verbal cues for safe use of DME/AE    Sit to Stand Details (indicate cue type and reason) cues for hand placement  as pt still will reach for walker and to scoot out towards the edge of mat surface    Stand to Sit 4: Min guard;With upper extremity assist;To bed;To chair/3-in-1    Stand to Sit Details cues to reach back towards mat table      Ambulation/Gait   Ambulation/Gait Yes    Ambulation/Gait Assistance 4: Min guard    Ambulation/Gait Assistance Details cues for posture, incr step length and foot clearance/heel strike (esp with RLE). cues to stay closer to RW esp when going around turns. educated pt and pt's spouse on importance of using RW for gait due to fall risk    Ambulation Distance (Feet) 230 Feet   x1 plus into clinic   Assistive device Rolling walker    Gait Pattern Step-through pattern;Decreased arm swing - right;Decreased arm swing - left;Decreased stride length;Decreased dorsiflexion -  right;Decreased dorsiflexion - left;Poor foot clearance - left;Poor foot clearance - right;Trunk flexed    Ambulation Surface Level;Indoor      Exercises   Exercises Other Exercises    Other Exercises  seated heel <> toe raises: x10 reps with verbal/demo cues. seated alternating marching x5 reps B, then repeated with 2# ankle weight - with visual cue from therapist on how high to lift legs. x10 reps LAQs with RLE, cues for ROM and isometric hold                 Balance Exercises - 12/20/20 1428       Balance Exercises: Standing   Standing Eyes Opened Narrow base of support (BOS);Solid surface    Standing Eyes Opened Limitations with feet together: 2 x 5 reps head turns, 2 x 5 reps head nods (incr difficulty with nods), needing multimodal cues for proper technique    Standing Eyes Closed Wide (BOA);Solid surface;Other reps (comment);30 secs;Limitations    Standing Eyes Closed Limitations on level ground feet apart 2 x 30 seconds, when attempting with a more narrow BOS 3 x 10-15 seconds with min A for balance and pt needing UE support, losing balance posteriorly    SLS Eyes open;Upper extremity support 1;Limitations    SLS Limitations alternating step taps to 6" steps x10 reps, cues for incr foot clearance with RLE    Sidestepping Upper extremity support;3 reps;Limitations    Sidestepping Limitations down and back 3 reps with UE support, needing manual cues to keep pelvis rotation forwards and cues for incr foot clearance with RLE               PT Education - 12/20/20 1444     Education Details monitoring BP at home 2x per day due to elevated readings in therapy and writing values down - pt's spouse reports she has not been checking at home. and then if values remain elevated will need to reach out to pt's PCP    Person(s) Educated Patient;Spouse    Methods Explanation    Comprehension Verbalized understanding              PT Short Term Goals - 12/06/20 1548       PT  SHORT TERM GOAL #1   Title Patient will improve Berg Balance to >/= 30/56 to demonstrate improved balance and reduced fall risk. ALL STGS DUE 12/21/20    Baseline 26/56    Time 4    Period Weeks    Status Revised    Target Date 12/21/20      PT SHORT TERM GOAL #2   Title Pt  and pt's spouse will be independent with initial HEP in order to build upon functional gains made in therapy.    Time 4    Period Weeks    Status New      PT SHORT TERM GOAL #3   Title Pt will ambulate at least 28' with appropriate AD and supervision over indoor leel surfaces in order to demo improved household mobility.    Time 4    Period Weeks    Status New      PT SHORT TERM GOAL #4   Title Pt will improve gait speed to at least 2.0 ft/sec with appropriate AD in order to demo decr fall risk.    Baseline 1.73 ft/sec    Time 4    Period Weeks    Status New               PT Long Term Goals - 12/06/20 1549       PT LONG TERM GOAL #1   Title Patient will improve Berg Balance to >/= 35/56 in order to demo decr fall risk. ALL LTGS DUE 01/18/21    Baseline 26/56    Time 8    Period Weeks    Status Revised      PT LONG TERM GOAL #2   Title Pt will decr TUG time to 25 seconds or less with appropriate AD vs. no AD in order to demo decr fall risk.    Baseline 33.22 seconds with single HHA from therapist    Time 8    Period Weeks    Status New      PT LONG TERM GOAL #3   Title Pt will ambulate at least 200' over outdoor unlevel surfaces with LRAD with supervision in order to demo improved community mobility.    Time 8    Period Weeks    Status New      PT LONG TERM GOAL #4   Title Pt will improve gait speed to at least 2.5 ft/sec with appropriate AD in order to demo improved community mobility    Baseline 1.73 ft/sec with HHA from therapist    Time 8    Period Weeks    Status New      PT LONG TERM GOAL #5   Title Pt will perform 5x sit <> stand in 18 seconds or less in order to demo decr fall  risk and improved functional BLE strength.    Baseline 23.63 seconds with single UE support/none    Time 8    Period Weeks    Status New      PT LONG TERM GOAL #6   Title Pt will improve FOTO score to at least a 72 in order to demo improved functional outcomes.    Baseline 58    Time 8    Period Weeks    Status New                   Plan - 12/20/20 1452     Clinical Impression Statement Pt with elevated BP during session today (but still WFL to participate in PT). Pt reporting feeling asympatomatic throughout. Educated pt and pt's spouse on importance of monitoring BP 2x daily and to let PCP know if BP remains elevated. And to check it again once pt is home later today from PT to make sure BP decr (pt has another dose of BP meds later today). Today's skilled session focused on standing balance strategies and strengthening.  Pt challenged with more narrow BOS on level ground with eyes closed, with tendency to lose balance posteriorly. Will continue to progress towards LTGs.    Personal Factors and Comorbidities Comorbidity 3+;Past/Current Experience    Comorbidities a fib, CVA,HLD, HTN, CAD, CABG (2015)    Examination-Activity Limitations Bathing;Dressing;Locomotion Level;Reach Overhead;Squat;Transfers;Stairs;Stand    Examination-Participation Restrictions Community Activity;Occupation;Cleaning    Stability/Clinical Decision Making Evolving/Moderate complexity    Rehab Potential Good    PT Frequency 2x / week    PT Duration 12 weeks   16 visits over 12 weeks   PT Treatment/Interventions ADLs/Self Care Home Management;DME Instruction;Gait training;Stair training;Functional mobility training;Therapeutic exercise;Therapeutic activities;Balance training;Neuromuscular re-education;Patient/family education;Orthotic Fit/Training;Vestibular    PT Next Visit Plan monitor BP. Continue gait training with AD. Continue supine and seated strengthening. NuStep/SciFit as warmup. Continue static  balance and balance with head motions    Consulted and Agree with Plan of Care Patient;Family member/caregiver    Family Member Consulted pt's wife [name]             Patient will benefit from skilled therapeutic intervention in order to improve the following deficits and impairments:  Abnormal gait, Decreased balance, Decreased activity tolerance, Decreased coordination, Decreased endurance, Decreased knowledge of use of DME, Decreased strength, Postural dysfunction  Visit Diagnosis: Unsteadiness on feet  Other abnormalities of gait and mobility  Muscle weakness (generalized)     Problem List Patient Active Problem List   Diagnosis Date Noted   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   Hematoma of right thigh 11/19/2018   Symptomatic anemia 11/08/2018   Pseudophakia 03/06/2018   S/P laser cataract surgery 03/06/2018   AMD (age-related macular degeneration), bilateral 02/26/2018   Combined form of senile cataract 02/17/2018   Herpes zoster with complication 04/09/2017   UTI (urinary tract infection) 02/18/2016   Acute on chronic combined systolic and diastolic congestive heart failure (HCC) 02/17/2016   CHF (congestive heart failure) (HCC) 02/17/2016   Gait disturbance 02/17/2016   Near syncope 09/03/2015   Hyponatremia 09/03/2015   Bronchitis 08/29/2015   Encounter for therapeutic drug monitoring 12/12/2013   Anticoagulated on warfarin 12/10/2013   Anticoagulated 12/10/2013   Cardiomyopathy, ischemic- EF 40-45% 12/09/2013   Atrial fibrillation with RVR (HCC) 12/05/2013   CAD- last PCI [facility] yrs [facility] at [facility] 12/05/2013   GERD (gastroesophageal reflux disease)    Hyperlipemia    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 [place], [place], [address] Phone: [phone]   Fax:  [phone]  Name: Edgar Thomas MRN:  [id number] Date of Birth: 11/08/1929

## 2020-12-22 ENCOUNTER — Ambulatory Visit: Payer: Medicare Other | Admitting: Physical Therapy

## 2020-12-22 ENCOUNTER — Encounter: Payer: Self-pay | Admitting: Physical Therapy

## 2020-12-22 ENCOUNTER — Other Ambulatory Visit: Payer: Self-pay

## 2020-12-22 VITALS — BP 145/82

## 2020-12-22 DIAGNOSIS — R2681 Unsteadiness on feet: Secondary | ICD-10-CM

## 2020-12-22 DIAGNOSIS — M6281 Muscle weakness (generalized): Secondary | ICD-10-CM | POA: Diagnosis not present

## 2020-12-22 DIAGNOSIS — R2689 Other abnormalities of gait and mobility: Secondary | ICD-10-CM

## 2020-12-22 DIAGNOSIS — R293 Abnormal posture: Secondary | ICD-10-CM | POA: Diagnosis not present

## 2020-12-22 NOTE — Therapy (Addendum)
[facility] [address] [place], [place], [address] Phone: [phone]   Fax:  [phone]  Physical Therapy Treatment  Patient Details  Name: Edgar Thomas MRN: [id number] Date of Birth: Sep 18, 1929 Referring Provider (PT): Dr. Anne Hahn   Encounter Date: 12/22/2020   PT End of Session - 12/22/20 1620     Visit Number 7    Number of Visits 17    Date for PT Re-Evaluation 02/20/21    Authorization Type BCBS Medicare    PT Start Time 1452   pt arrived late   PT Stop Time 1532    PT Time Calculation (min) 40 min    Equipment Utilized During Treatment Gait belt    Activity Tolerance Patient tolerated treatment well    Behavior During Therapy WFL for tasks assessed/performed             Past Medical History:  Diagnosis Date   A-fib (HCC)    Anemia    CAD (coronary artery disease)    Constipation    GERD (gastroesophageal reflux disease)    Heart attack (HCC)    Heartburn    Hyperlipemia    Hypertension    Post herpetic neuralgia    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 LIMA to LAD, sequential SVG to OM 1 & OM 2, SVG to PD;  Surgeon: Delight Ovens, MD;  Location: [facility] OR;  Service: Open Heart Surgery;  Laterality: N/A;   ENDOVEIN HARVEST OF GREATER SAPHENOUS VEIN Right 12/03/2013   Procedure: ENDOVEIN HARVEST OF GREATER SAPHENOUS VEIN;  Surgeon: Delight Ovens, MD;  Location: [facility] OR;  Service: Open Heart Surgery;  Laterality: Right;   INTRAOPERATIVE  TRANSESOPHAGEAL ECHOCARDIOGRAM N/A 12/03/2013   Procedure: INTRAOPERATIVE TRANSESOPHAGEAL ECHOCARDIOGRAM;  Surgeon: Delight Ovens, MD;  [facility];  Service: Open Heart Surgery;  Laterality: N/A;    Vitals:   12/22/20 1459  BP: (!) 145/82     Subjective Assessment - 12/22/20 1455     Subjective BP has been in the 140s/70s at home. No falls.    Patient is accompained by: Family member   spouse [name]   Pertinent History PMH: a fib, CVA,HLD, HTN, CAD, CABG (2015)    Limitations Walking;Standing;House hold activities    Patient Stated Goals wants to improve his balance and walking.    Currently in Pain? No/denies                               [facility] Adult PT Treatment/Exercise - 12/23/20 0800       Transfers   Transfers Stand to Sit;Sit to Stand    Sit to Stand 4: Min guard;With upper extremity assist;From bed;From chair/3-in-1    Sit to Stand Details Tactile cues for posture;Verbal cues for sequencing;Verbal cues for precautions/safety;Verbal cues for technique;Verbal cues for safe use of DME/AE    Sit to Stand Details (indicate cue type and  reason) cues for proper UE placement to push up from mat    Stand to Sit 4: Min guard;With upper extremity assist;To bed;To chair/3-in-1    Comments x5 reps from mat table, cues for tall posture once in standing      Ambulation/Gait   Ambulation/Gait Yes    Ambulation/Gait Assistance 4: Min guard    Ambulation/Gait Assistance Details cues needed to stay inside BOS (pt with tendency to be too far and to the R), for posture and for foot clearance and incr step length esp with RLE    Ambulation Distance (Feet) 100 Feet   x2   Assistive device Rolling walker    Gait Pattern Step-through pattern;Decreased arm swing - right;Decreased arm swing - left;Decreased stride length;Decreased dorsiflexion - right;Decreased dorsiflexion - left;Poor foot clearance - left;Poor foot clearance - right;Trunk flexed    Ambulation Surface  Level;Indoor      Therapeutic Activites    Therapeutic Activities Other Therapeutic Activities    Other Therapeutic Activities pt's spouse asking about a seated stepper pt can use (previous PT discussed a cubie), showed pt's spouse this option on [place] and was potentially obtaining a seated peddle bike - will show this option at next visit, did not have time at end of this session                 Balance Exercises - 12/22/20 1521       Balance Exercises: Standing   Standing Eyes Opened Wide (BOA);Foam/compliant surface    Standing Eyes Opened Limitations static holds 3 x 30 seconds with intermittent taps to bars for balance, alternatig UE lifts x5 reps, trunk rotations 8 reps B with reaching towards target (therapist holding ball either to pt's R or L)    SLS Eyes open;Upper extremity support 1;Limitations    SLS Limitations alternating step taps to 6" steps x10 reps, cues for incr foot clearance with RLE and for proper sequencing of exercise.    Step Ups Forward;UE support 2;4 inch;Limitations    Step Ups Limitations leading with RLE x10 reps, cues for posture and full knee extension with step up   Heel Raises Both;10 reps;Other (comment)   2 sets with BUE support   Toe Raise 10 reps;Both   2 sets with BUE support   Other Standing Exercises with BUE support; x10 reps stepping over 2" obstacle with RLE for incr foot clearance, then performed x10 reps stepping over with LLE for incr stance time on RLE. Needs max cues for sequencing                 PT Short Term Goals - 12/06/20 1548       PT SHORT TERM GOAL #1   Title Patient will improve Berg Balance to >/= 30/56 to demonstrate improved balance and reduced fall risk. ALL STGS DUE 12/21/20    Baseline 26/56    Time 4    Period Weeks    Status Revised    Target Date 12/21/20      PT SHORT TERM GOAL #2   Title Pt and pt's spouse will be independent with initial HEP in order to build upon functional gains made in  therapy.    Time 4    Period Weeks    Status New      PT SHORT TERM GOAL #3   Title Pt will ambulate at least 80' with appropriate AD and supervision over indoor leel surfaces in order to demo improved household mobility.  Time 4    Period Weeks    Status New      PT SHORT TERM GOAL #4   Title Pt will improve gait speed to at least 2.0 ft/sec with appropriate AD in order to demo decr fall risk.    Baseline 1.73 ft/sec    Time 4    Period Weeks    Status New               PT Long Term Goals - 12/06/20 1549       PT LONG TERM GOAL #1   Title Patient will improve Berg Balance to >/= 35/56 in order to demo decr fall risk. ALL LTGS DUE 01/18/21    Baseline 26/56    Time 8    Period Weeks    Status Revised      PT LONG TERM GOAL #2   Title Pt will decr TUG time to 25 seconds or less with appropriate AD vs. no AD in order to demo decr fall risk.    Baseline 33.22 seconds with single HHA from therapist    Time 8    Period Weeks    Status New      PT LONG TERM GOAL #3   Title Pt will ambulate at least 200' over outdoor unlevel surfaces with LRAD with supervision in order to demo improved community mobility.    Time 8    Period Weeks    Status New      PT LONG TERM GOAL #4   Title Pt will improve gait speed to at least 2.5 ft/sec with appropriate AD in order to demo improved community mobility    Baseline 1.73 ft/sec with HHA from therapist    Time 8    Period Weeks    Status New      PT LONG TERM GOAL #5   Title Pt will perform 5x sit <> stand in 18 seconds or less in order to demo decr fall risk and improved functional BLE strength.    Baseline 23.63 seconds with single UE support/none    Time 8    Period Weeks    Status New      PT LONG TERM GOAL #6   Title Pt will improve FOTO score to at least a 72 in order to demo improved functional outcomes.    Baseline 58    Time 8    Period Weeks    Status New                   Plan - 12/23/20 7371      Clinical Impression Statement Today's skilled session continued to focus on gait and transfer training as well as standing strengthening/balance tasks. Pt continues to need cues for proper UE placement for sit <> stands and for staying inside of BOS with walker. Pt needing max verbal and demo cues for proper technique and sequencing with standing exercises, esp with step taps. Will continue to progress towards LTGs.    Personal Factors and Comorbidities Comorbidity 3+;Past/Current Experience    Comorbidities a fib, CVA,HLD, HTN, CAD, CABG (2015)    Examination-Activity Limitations Bathing;Dressing;Locomotion Level;Reach Overhead;Squat;Transfers;Stairs;Stand    Examination-Participation Restrictions Community Activity;Occupation;Cleaning    Stability/Clinical Decision Making Evolving/Moderate complexity    Rehab Potential Good    PT Frequency 2x / week    PT Duration 12 weeks   16 visits over 12 weeks   PT Treatment/Interventions ADLs/Self Care Home Management;DME Instruction;Gait training;Stair training;Functional mobility training;Therapeutic exercise;Therapeutic  activities;Balance training;Neuromuscular re-education;Patient/family education;Orthotic Fit/Training;Vestibular    PT Next Visit Plan monitor BP. look at HEP and add in additional seated balance. Continue gait training with AD. Continue supine and seated strengthening. NuStep/SciFit as warmup. Continue static balance and balance with head motions    Consulted and Agree with Plan of Care Patient;Family member/caregiver    Family Member Consulted pt's wife Selena BattenKim             Patient will benefit from skilled therapeutic intervention in order to improve the following deficits and impairments:  Abnormal gait, Decreased balance, Decreased activity tolerance, Decreased coordination, Decreased endurance, Decreased knowledge of use of DME, Decreased strength, Postural dysfunction  Visit Diagnosis: Unsteadiness on feet  Other  abnormalities of gait and mobility  Muscle weakness (generalized)     Problem List Patient Active Problem List   Diagnosis Date Noted   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   Hematoma of right thigh 11/19/2018   Symptomatic anemia 11/08/2018   Pseudophakia 03/06/2018   S/P laser cataract surgery 03/06/2018   AMD (age-related macular degeneration), bilateral 02/26/2018   Combined form of senile cataract 02/17/2018   Herpes zoster with complication 04/09/2017   UTI (urinary tract infection) 02/18/2016   Acute on chronic combined systolic and diastolic congestive heart failure (HCC) 02/17/2016   CHF (congestive heart failure) (HCC) 02/17/2016   Gait disturbance 02/17/2016   Near syncope 09/03/2015   Hyponatremia 09/03/2015   Bronchitis 08/29/2015   Encounter for therapeutic drug monitoring 12/12/2013   Anticoagulated on warfarin 12/10/2013   Anticoagulated 12/10/2013   Cardiomyopathy, ischemic- EF 40-45% 12/09/2013   Atrial fibrillation with RVR (HCC) 12/05/2013   [facility] 12/05/2013   GERD (gastroesophageal reflux disease)    Hyperlipemia    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

## 2020-12-27 ENCOUNTER — Ambulatory Visit: Payer: Medicare Other | Attending: Neurology | Admitting: Physical Therapy

## 2020-12-27 ENCOUNTER — Encounter: Payer: Self-pay | Admitting: Physical Therapy

## 2020-12-27 ENCOUNTER — Other Ambulatory Visit: Payer: Self-pay

## 2020-12-27 DIAGNOSIS — M6281 Muscle weakness (generalized): Secondary | ICD-10-CM | POA: Insufficient documentation

## 2020-12-27 DIAGNOSIS — R2689 Other abnormalities of gait and mobility: Secondary | ICD-10-CM | POA: Insufficient documentation

## 2020-12-27 DIAGNOSIS — R293 Abnormal posture: Secondary | ICD-10-CM

## 2020-12-27 DIAGNOSIS — R2681 Unsteadiness on feet: Secondary | ICD-10-CM | POA: Diagnosis not present

## 2020-12-27 NOTE — Therapy (Signed)
[facility] [address] [place], [place], [address] Phone: [phone]   Fax:  [phone]  Physical Therapy Treatment  Patient Details  Name: Edgar Thomas MRN: [id number] Date of Birth: 06/08/1929 Referring Provider (PT): Dr. Jannifer Franklin   Encounter Date: 12/27/2020   PT End of Session - 12/27/20 1647     Visit Number 8    Number of Visits 17    Date for PT Re-Evaluation 02/20/21    Authorization Type BCBS Medicare    PT Start Time 1329   pt arrived late to session   PT Stop Time 1401    PT Time Calculation (min) 32 min    Equipment Utilized During Treatment Gait belt    Activity Tolerance Patient tolerated treatment well    Behavior During Therapy WFL for tasks assessed/performed             Past Medical History:  Diagnosis Date   A-fib ([place])    Anemia    CAD (coronary artery disease)    Constipation    GERD (gastroesophageal reflux disease)    Heart attack ([place])    Heartburn    Hyperlipemia    Hypertension    Post herpetic neuralgia    Stroke (cerebrum) ([place]) 11/04/2020   Left lenticulostriate and external capsule   Stroke ([place])    TIA (transient ischemic attack)    Vitamin D deficiency     Past Surgical History:  Procedure Laterality Date   balloon angioplasty of LAD     CARDIOVERSION  2018   a fib   CATARACT EXTRACTION, BILATERAL  1019, 2022   COLONOSCOPY     CORONARY ARTERY BYPASS GRAFT N/A 12/03/2013   Procedure: CORONARY ARTERY BYPASS GRAFTING (CABG) x4 using left internal mammary artery and right greater saphenous vein. LIMA to LAD, sequential SVG to OM 1 & OM 2, SVG to PD;  Surgeon: Grace Isaac, MD;  Location: [facility];  Service: Open Heart Surgery;  Laterality: N/A;   ENDOVEIN HARVEST OF GREATER SAPHENOUS VEIN Right 12/03/2013   Procedure: ENDOVEIN HARVEST OF GREATER SAPHENOUS VEIN;  Surgeon: Grace Isaac, MD;  Location: [facility];  Service: Open Heart Surgery;  Laterality: Right;    INTRAOPERATIVE TRANSESOPHAGEAL ECHOCARDIOGRAM N/A 12/03/2013   Procedure: INTRAOPERATIVE TRANSESOPHAGEAL ECHOCARDIOGRAM;  Surgeon: Grace Isaac, MD;  Location: [facility];  Service: Open Heart Surgery;  Laterality: N/A;    There were no vitals filed for this visit.   Subjective Assessment - 12/27/20 1331     Subjective No changes since he was last here. Has not been doing his exercises at home.    Patient is accompained by: Family member   spouse [name]   Pertinent History PMH: a fib, CVA,HLD, HTN, CAD, CABG (2015)    Limitations Walking;Standing;House hold activities    Patient Stated Goals wants to improve his balance and walking.    Currently in Pain? No/denies                               [facility] Adult PT Treatment/Exercise - 12/27/20 1337       Transfers   Transfers Stand to Sit;Sit to Stand    Sit to Stand 4: Min guard;With upper extremity assist;From bed;From chair/3-in-1    Sit to Stand Details (indicate cue type and reason) initial cues to press up from mat table vs. walker    Stand to Sit 4: Min guard;With upper extremity  assist;To bed;To chair/3-in-1    Stand to Sit Details cues to reach back for descent back to mat table      Ambulation/Gait   Ambulation/Gait Yes    Ambulation/Gait Assistance 4: Min guard    Ambulation/Gait Assistance Details cues for posture (looking approx. x10 ft ahead vs. down at ground), staying inside BOS and for foot clearance/stride length    Ambulation Distance (Feet) 115 Feet   plus into and out of clinic   Assistive device Rolling walker    Gait Pattern Step-through pattern;Decreased arm swing - right;Decreased arm swing - left;Decreased stride length;Decreased dorsiflexion - right;Decreased dorsiflexion - left;Poor foot clearance - left;Poor foot clearance - right;Trunk flexed    Ambulation Surface Level;Indoor    Gait velocity 18.62 seconds = 1.76 ft/sec                 Balance Exercises - 12/27/20 1359        Balance Exercises: Standing   Standing Eyes Opened Narrow base of support (BOS)    Standing Eyes Opened Limitations on level ground feet together: x10 reps head turns (needing cues throughout for pt to turn his head), x10 reps head nods. feet apart on single pillow: alternating UE raises x5 reps B    Standing Eyes Closed Wide (BOA);Foam/compliant surface;Solid surface    Standing Eyes Closed Limitations feet hip width on level ground 2 x 30 seconds (upgraded to HEP), wide BOS on foam 3 x 15 seconds (with min guard/min A for balance)    Heel Raises Right;10 reps   with BUE support          Access Code: [email or web address] URL: [email or web address] Date: 12/27/2020 Prepared by: Janann August  Reviewed and updated HEP as appropriate;    Exercises Sit to Stand with Armchair - 1 x daily - 5 x weekly - 2 sets - 10 reps Standing Balance with Eyes Closed - 1 x daily - 5 x weekly - 1 sets - 3 reps - 30 seconds hold - progressed from wider BOS to having feet hip width  Romberg Stance with Head Nods - 1 x daily - 5 x weekly - 1 sets - 10 reps - with eyes open, verbal cues throughout for proper technique and not trying to use UE support  Supine Bridge - 1 x daily - 5 x weekly - 1 sets - 10 reps - 2-3 seconds hold - cues for full ROM  Seated March - 1 x daily - 5 x weekly - 2 sets - 10 reps -updated from supine marching     PT Education - 12/27/20 1647     Education Details reviewed and updated HEP as appropriate.    Person(s) Educated Patient;Spouse    Methods Explanation;Handout;Demonstration;Tactile cues    Comprehension Verbalized understanding;Returned demonstration              PT Short Term Goals - 12/27/20 1336       PT SHORT TERM GOAL #1   Title Patient will improve Berg Balance to >/= 30/56 to demonstrate improved balance and reduced fall risk. ALL STGS DUE 12/21/20    Baseline 26/56- did not have time to assess on 12/27/20    Time 4    Period Weeks    Status Deferred     Target Date 12/21/20      PT SHORT TERM GOAL #2   Title Pt and pt's spouse will be independent with initial HEP in order to build upon functional gains made in therapy.  Baseline pt and pt's spouse reports that he has not been performing consistently    Time 4    Period Weeks    Status Partially Met      PT SHORT TERM GOAL #3   Title Pt will ambulate at least 13' with appropriate AD and supervision over indoor leel surfaces in order to demo improved household mobility.    Baseline needs min guard with RW, able to walk proper distance    Time 4    Period Weeks    Status Partially Met      PT SHORT TERM GOAL #4   Title Pt will improve gait speed to at least 2.0 ft/sec with appropriate AD in order to demo decr fall risk.    Baseline 1.73 ft/sec; 18.62 seconds = 1.76 ft/sec    Time 4    Period Weeks    Status Not Met               PT Long Term Goals - 12/06/20 1549       PT LONG TERM GOAL #1   Title Patient will improve Berg Balance to >/= 35/56 in order to demo decr fall risk. ALL LTGS DUE 01/18/21    Baseline 26/56    Time 8    Period Weeks    Status Revised      PT LONG TERM GOAL #2   Title Pt will decr TUG time to 25 seconds or less with appropriate AD vs. no AD in order to demo decr fall risk.    Baseline 33.22 seconds with single HHA from therapist    Time 8    Period Weeks    Status New      PT LONG TERM GOAL #3   Title Pt will ambulate at least 200' over outdoor unlevel surfaces with LRAD with supervision in order to demo improved community mobility.    Time 8    Period Weeks    Status New      PT LONG TERM GOAL #4   Title Pt will improve gait speed to at least 2.5 ft/sec with appropriate AD in order to demo improved community mobility    Baseline 1.73 ft/sec with HHA from therapist    Time 8    Period Weeks    Status New      PT LONG TERM GOAL #5   Title Pt will perform 5x sit <> stand in 18 seconds or less in order to demo decr fall risk and  improved functional BLE strength.    Baseline 23.63 seconds with single UE support/none    Time 8    Period Weeks    Status New      PT LONG TERM GOAL #6   Title Pt will improve FOTO score to at least a 72 in order to demo improved functional outcomes.    Baseline 58    Time 8    Period Weeks    Status New                   Plan - 12/27/20 1653     Clinical Impression Statement Began to check pt's STGs today. Unable to perform BERG due to time constraints as pt late for appt. Pt partially met STGs#2 and #3. Able to ambulate distance of 115' with walker, but does need min guard and cues to stay inside BOS of walker (esp with turns) and for foot clearance and stride length throughout. Pt did not meet  STG #3, performed gait speed today of 1.76 ft/sec with RW (previously 1.73 ft/sec with HHA). Remainder of session focused on reviewing and updating HEP as appropriate.    Personal Factors and Comorbidities Comorbidity 3+;Past/Current Experience    Comorbidities a fib, CVA,HLD, HTN, CAD, CABG (2015)    Examination-Activity Limitations Bathing;Dressing;Locomotion Level;Reach Overhead;Squat;Transfers;Stairs;Stand    Examination-Participation Restrictions Community Activity;Occupation;Cleaning    Stability/Clinical Decision Making Evolving/Moderate complexity    Rehab Potential Good    PT Frequency 2x / week    PT Duration 12 weeks   16 visits over 12 weeks   PT Treatment/Interventions ADLs/Self Care Home Management;DME Instruction;Gait training;Stair training;Functional mobility training;Therapeutic exercise;Therapeutic activities;Balance training;Neuromuscular re-education;Patient/family education;Orthotic Fit/Training;Vestibular    PT Next Visit Plan monitor BP. Continue gait training with AD. Continue supine and seated strengthening. NuStep/SciFit as warmup. Continue static balance and balance with head motions    Consulted and Agree with Plan of Care Patient;Family member/caregiver     Family Member Consulted pt's wife [name]             Patient will benefit from skilled therapeutic intervention in order to improve the following deficits and impairments:  Abnormal gait, Decreased balance, Decreased activity tolerance, Decreased coordination, Decreased endurance, Decreased knowledge of use of DME, Decreased strength, Postural dysfunction  Visit Diagnosis: Unsteadiness on feet  Other abnormalities of gait and mobility  Muscle weakness (generalized)  Abnormal posture     Problem List Patient Active Problem List   Diagnosis Date Noted   Stroke (cerebrum) ([place]) 11/04/2020   Hematoma of right thigh 11/19/2018   Symptomatic anemia 11/08/2018   Pseudophakia 03/06/2018   S/P laser cataract surgery 03/06/2018   AMD (age-related macular degeneration), bilateral 02/26/2018   Combined form of senile cataract 02/17/2018   Herpes zoster with complication 00/71/2197   UTI (urinary tract infection) 02/18/2016   Acute on chronic combined systolic and diastolic congestive heart failure ([place]) 02/17/2016   CHF (congestive heart failure) ([place]) 02/17/2016   Gait disturbance 02/17/2016   Near syncope 09/03/2015   Hyponatremia 09/03/2015   Bronchitis 08/29/2015   Encounter for therapeutic drug monitoring 12/12/2013   Anticoagulated on warfarin 12/10/2013   Anticoagulated 12/10/2013   Cardiomyopathy, ischemic- EF 40-45% 12/09/2013   Atrial fibrillation with RVR ([place]) 12/05/2013   CAD- last PCI [facility] yrs [facility] at [facility] 12/05/2013   GERD (gastroesophageal reflux disease)    Hyperlipemia    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 [place], [place], [address] Phone: [phone]   Fax:  [phone]  Name: Amier Hoyt MRN: [id number] Date of Birth: 1929/10/10

## 2020-12-27 NOTE — Patient Instructions (Signed)
Access Code: [facility] URL: [email or web address] Date: 12/27/2020 Prepared by: Sherlie Ban  Exercises Sit to Stand with Armchair - 1 x daily - 5 x weekly - 2 sets - 10 reps Standing Balance with Eyes Closed - 1 x daily - 5 x weekly - 1 sets - 3 reps - 30 seconds hold Romberg Stance with Head Nods - 1 x daily - 5 x weekly - 1 sets - 10 reps Supine Bridge - 1 x daily - 5 x weekly - 1 sets - 10 reps - 2-3 seconds hold Seated March - 1 x daily - 5 x weekly - 2 sets - 10 reps

## 2020-12-29 ENCOUNTER — Encounter: Payer: Self-pay | Admitting: Physical Therapy

## 2020-12-29 ENCOUNTER — Other Ambulatory Visit: Payer: Self-pay

## 2020-12-29 ENCOUNTER — Ambulatory Visit: Payer: Medicare Other | Admitting: Physical Therapy

## 2020-12-29 VITALS — BP 140/85

## 2020-12-29 DIAGNOSIS — R2681 Unsteadiness on feet: Secondary | ICD-10-CM

## 2020-12-29 DIAGNOSIS — R293 Abnormal posture: Secondary | ICD-10-CM | POA: Diagnosis not present

## 2020-12-29 DIAGNOSIS — M6281 Muscle weakness (generalized): Secondary | ICD-10-CM | POA: Diagnosis not present

## 2020-12-29 DIAGNOSIS — R2689 Other abnormalities of gait and mobility: Secondary | ICD-10-CM

## 2020-12-29 NOTE — Therapy (Addendum)
[facility] [address] [place], [place], [address] Phone: [phone]   Fax:  [phone]  Physical Therapy Treatment  Patient Details  Name: Edgar Thomas MRN: [id number] Date of Birth: [birth year] Referring Provider (PT): Dr. Jannifer Franklin   Encounter Date: 12/29/2020   PT End of Session - 12/29/20 1541     Visit Number 9    Number of Visits 17    Date for PT Re-Evaluation 02/20/21    Authorization Type BCBS Medicare    PT Start Time 1455   pt late to session   PT Stop Time 1    PT Time Calculation (min) 35 min    Equipment Utilized During Treatment Gait belt    Activity Tolerance Patient tolerated treatment well    Behavior During Therapy WFL for tasks assessed/performed             Past Medical History:  Diagnosis Date   A-fib ([place])    Anemia    CAD (coronary artery disease)    Constipation    GERD (gastroesophageal reflux disease)    Heart attack ([place])    Heartburn    Hyperlipemia    Hypertension    Post herpetic neuralgia    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 LIMA to LAD, sequential SVG to OM 1 & OM 2, SVG to PD;  Surgeon: Grace Isaac, MD;  Location: [facility];  Service: Open Heart Surgery;  Laterality: N/A;   ENDOVEIN HARVEST OF GREATER SAPHENOUS VEIN Right 12/03/2013   Procedure: ENDOVEIN HARVEST OF GREATER SAPHENOUS VEIN;  Surgeon: Grace Isaac, MD;  Location: [facility];  Service: Open Heart Surgery;  Laterality: Right;   INTRAOPERATIVE  TRANSESOPHAGEAL ECHOCARDIOGRAM N/A 12/03/2013   Procedure: INTRAOPERATIVE TRANSESOPHAGEAL ECHOCARDIOGRAM;  Surgeon: Grace Isaac, MD;  Location: [facility];  Service: Open Heart Surgery;  Laterality: N/A;    Vitals:   12/29/20 1500  BP: 140/85     Subjective Assessment - 12/29/20 1456     Subjective No falls, no changes.    Patient is accompained by: Family member   spouse [name]   Pertinent History PMH: a fib, CVA,HLD, HTN, CAD, CABG (2015)    Limitations Walking;Standing;House hold activities    Patient Stated Goals wants to improve his balance and walking.    Currently in Pain? No/denies                               [facility] Adult PT Treatment/Exercise - 12/29/20 1528       Transfers   Transfers Stand to Sit;Sit to Stand    Comments x5 reps from elevated mat table without UE support, cues for tall posture once in standing      Ambulation/Gait   Ambulation/Gait Yes    Ambulation/Gait Assistance 4: Min guard    Ambulation/Gait Assistance Details cues for posture, staying inside of BOS and for incr stride length (RLE>LLE)  Ambulation Distance (Feet) 115 Feet    Assistive device Rolling walker    Gait Pattern Step-through pattern;Decreased arm swing - right;Decreased arm swing - left;Decreased stride length;Decreased dorsiflexion - right;Decreased dorsiflexion - left;Poor foot clearance - left;Poor foot clearance - right;Trunk flexed    Ambulation Surface Level;Indoor                 Balance Exercises - 12/29/20 1523       Balance Exercises: Standing   Standing Eyes Opened Wide (BOA);Foam/compliant surface    Standing Eyes Opened Limitations on blue air ex, ball toss with PT tech x12 reps, trunk rotations x8 reps B with reaching towards target (ball held by PT), min guard/min A as needed for balance    Standing Eyes Closed Solid surface    Standing Eyes Closed Limitations feet hip width distance; 3 x 30 seconds, cues for posture before closing eyes     SLS Eyes open;Upper extremity support 1;Limitations    SLS Limitations alternating tapping to cones with BUE support x10 reps B, cues for incr hip/knee flexion    Step Ups Forward;UE support 2;4 inch;Limitations    Step Ups Limitations alternating legs x6 reps B, needs cues for sequencing    Sidestepping Upper extremity support;3 reps;Limitations    Sidestepping Limitations down and back 3 reps with UE support, needing manual cues to keep pelvis rotation forwards and cues for incr foot clearance with RLE, trying to do a couple without UE support    Other Standing Exercises with BUE support: x10 reps heel toe raises, cues for technique                 PT Short Term Goals - 12/27/20 1336       PT SHORT TERM GOAL #1   Title Patient will improve Berg Balance to >/= 30/56 to demonstrate improved balance and reduced fall risk. ALL STGS DUE 12/21/20    Baseline 26/56- did not have time to assess on 12/27/20    Time 4    Period Weeks    Status Deferred    Target Date 12/21/20      PT SHORT TERM GOAL #2   Title Pt and pt's spouse will be independent with initial HEP in order to build upon functional gains made in therapy.    Baseline pt and pt's spouse reports that he has not been performing consistently    Time 4    Period Weeks    Status Partially Met      PT SHORT TERM GOAL #3   Title Pt will ambulate at least 98' with appropriate AD and supervision over indoor leel surfaces in order to demo improved household mobility.    Baseline needs min guard with RW, able to walk proper distance    Time 4    Period Weeks    Status Partially Met      PT SHORT TERM GOAL #4   Title Pt will improve gait speed to at least 2.0 ft/sec with appropriate AD in order to demo decr fall risk.    Baseline 1.73 ft/sec; 18.62 seconds = 1.76 ft/sec    Time 4    Period Weeks    Status Not Met               PT Long Term Goals - 12/06/20 1549       PT LONG TERM GOAL #1   Title Patient will  improve Berg Balance to >/= 35/56 in order to demo  decr fall risk. ALL LTGS DUE 01/18/21    Baseline 26/56    Time 8    Period Weeks    Status Revised      PT LONG TERM GOAL #2   Title Pt will decr TUG time to 25 seconds or less with appropriate AD vs. no AD in order to demo decr fall risk.    Baseline 33.22 seconds with single HHA from therapist    Time 8    Period Weeks    Status New      PT LONG TERM GOAL #3   Title Pt will ambulate at least 200' over outdoor unlevel surfaces with LRAD with supervision in order to demo improved community mobility.    Time 8    Period Weeks    Status New      PT LONG TERM GOAL #4   Title Pt will improve gait speed to at least 2.5 ft/sec with appropriate AD in order to demo improved community mobility    Baseline 1.73 ft/sec with HHA from therapist    Time 8    Period Weeks    Status New      PT LONG TERM GOAL #5   Title Pt will perform 5x sit <> stand in 18 seconds or less in order to demo decr fall risk and improved functional BLE strength.    Baseline 23.63 seconds with single UE support/none    Time 8    Period Weeks    Status New      PT LONG TERM GOAL #6   Title Pt will improve FOTO score to at least a 72 in order to demo improved functional outcomes.    Baseline 58    Time 8    Period Weeks    Status New                   Plan - 12/29/20 1718     Clinical Impression Statement Continued to work on BLE strengthening and standing balance strategies today with trying to decr UE support. Pt able to perform sit <> stands from slightly elevated mat table without UE support with no LOB. Pt tolerated session well - does need cues throughout for proper technique and sequencing with step ups and side stepping. Will continue to progress towards LTGs.    Personal Factors and Comorbidities Comorbidity 3+;Past/Current Experience    Comorbidities a fib, CVA,HLD, HTN, CAD, CABG (2015)    Examination-Activity Limitations  Bathing;Dressing;Locomotion Level;Reach Overhead;Squat;Transfers;Stairs;Stand    Examination-Participation Restrictions Community Activity;Occupation;Cleaning    Stability/Clinical Decision Making Evolving/Moderate complexity    Rehab Potential Good    PT Frequency 2x / week    PT Duration 12 weeks   16 visits over 12 weeks   PT Treatment/Interventions ADLs/Self Care Home Management;DME Instruction;Gait training;Stair training;Functional mobility training;Therapeutic exercise;Therapeutic activities;Balance training;Neuromuscular re-education;Patient/family education;Orthotic Fit/Training;Vestibular    PT Next Visit Plan needs 10th visit PN. monitor BP. Continue gait training with AD. RLE>LLE strengthening .trying NuStep/SciFit. standing balance with SLS, on compliant surfaces, head motions, eyes closed.    Consulted and Agree with Plan of Care Patient;Family member/caregiver    Family Member Consulted pt's wife [name]             Patient will benefit from skilled therapeutic intervention in order to improve the following deficits and impairments:  Abnormal gait, Decreased balance, Decreased activity tolerance, Decreased coordination, Decreased endurance, Decreased knowledge of use of DME, Decreased strength, Postural dysfunction  Visit Diagnosis: Unsteadiness on feet  Other abnormalities of gait and mobility  Muscle weakness (generalized)     Problem List Patient Active Problem List   Diagnosis Date Noted   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   Hematoma of right thigh 11/19/2018   Symptomatic anemia 11/08/2018   Pseudophakia 03/06/2018   S/P laser cataract surgery 03/06/2018   AMD (age-related macular degeneration), bilateral 02/26/2018   Combined form of senile cataract 02/17/2018   Herpes zoster with complication 70/17/7939   UTI (urinary tract infection) 02/18/2016   Acute on chronic combined systolic and diastolic congestive heart failure ([place]) 02/17/2016   CHF (congestive  heart failure) ([place]) 02/17/2016   Gait disturbance 02/17/2016   Near syncope 09/03/2015   Hyponatremia 09/03/2015   Bronchitis 08/29/2015   Encounter for therapeutic drug monitoring 12/12/2013   Anticoagulated on warfarin 12/10/2013   Anticoagulated 12/10/2013   Cardiomyopathy, ischemic- EF 40-45% 12/09/2013   Atrial fibrillation with RVR ([place]) 12/05/2013   CAD- last PCI [facility] yrs ago at [facility] 12/05/2013   GERD (gastroesophageal reflux disease)    Hyperlipemia    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

## 2021-01-03 ENCOUNTER — Other Ambulatory Visit: Payer: Self-pay

## 2021-01-03 ENCOUNTER — Encounter: Payer: Self-pay | Admitting: Physical Therapy

## 2021-01-03 ENCOUNTER — Ambulatory Visit: Payer: Medicare Other | Admitting: Physical Therapy

## 2021-01-03 DIAGNOSIS — R2681 Unsteadiness on feet: Secondary | ICD-10-CM

## 2021-01-03 DIAGNOSIS — R2689 Other abnormalities of gait and mobility: Secondary | ICD-10-CM

## 2021-01-03 DIAGNOSIS — M6281 Muscle weakness (generalized): Secondary | ICD-10-CM

## 2021-01-03 DIAGNOSIS — R293 Abnormal posture: Secondary | ICD-10-CM | POA: Diagnosis not present

## 2021-01-03 NOTE — Therapy (Addendum)
[facility] [address] [place], [place], [address] Phone: [phone]   Fax:  [phone]  Physical Therapy Treatment/10th Visit Progress Note  Patient Details  Name: Edgar Thomas MRN: [id number] Date of Birth: 10/26/1929 Referring Provider (PT): Dr. Jannifer Franklin 10th Visit Physical Therapy Progress Note  Dates of Reporting Period: 11/22/20 to 01/03/21    Encounter Date: 01/03/2021   PT End of Session - 01/03/21 1401     Visit Number 10    Number of Visits 17    Date for PT Re-Evaluation 02/20/21    Authorization Type BCBS Medicare    PT Start Time 1329   pt arrived late   PT Stop Time 1400    PT Time Calculation (min) 31 min    Equipment Utilized During Treatment Gait belt    Activity Tolerance Patient tolerated treatment well    Behavior During Therapy WFL for tasks assessed/performed             Past Medical History:  Diagnosis Date   A-fib ([facility])    Anemia    CAD (coronary artery disease)    Constipation    GERD (gastroesophageal reflux disease)    Heart attack ([place])    Heartburn    Hyperlipemia    Hypertension    Post herpetic neuralgia    Stroke (cerebrum) ([facility]) 11/04/2020   Left lenticulostriate and external capsule   Stroke ([place])    TIA (transient ischemic attack)    Vitamin D deficiency     Past Surgical History:  Procedure Laterality Date   balloon angioplasty of LAD     CARDIOVERSION  2018   a fib   CATARACT EXTRACTION, BILATERAL  1019, 2022   COLONOSCOPY     CORONARY ARTERY BYPASS GRAFT N/A 12/03/2013   Procedure: CORONARY ARTERY BYPASS GRAFTING (CABG) x4 using left internal mammary artery and right greater saphenous vein. LIMA to LAD, sequential SVG to OM 1 & OM 2, SVG to PD;  Surgeon: Grace Isaac, MD;  Location: [facility];  Service: Open Heart Surgery;  Laterality: N/A;   ENDOVEIN HARVEST OF GREATER SAPHENOUS VEIN Right 12/03/2013   Procedure: ENDOVEIN HARVEST OF GREATER SAPHENOUS VEIN;   Surgeon: Grace Isaac, MD;  Location: [facility];  Service: Open Heart Surgery;  Laterality: Right;   INTRAOPERATIVE TRANSESOPHAGEAL ECHOCARDIOGRAM N/A 12/03/2013   Procedure: INTRAOPERATIVE TRANSESOPHAGEAL ECHOCARDIOGRAM;  Surgeon: Grace Isaac, MD;  Location: [facility];  Service: Open Heart Surgery;  Laterality: N/A;    There were no vitals filed for this visit.   Subjective Assessment - 01/03/21 1330     Subjective No changes, no falls. Pt's wife has ordered a cubie for home    Patient is accompained by: Family member   spouse [name]   Pertinent History PMH: a fib, CVA,HLD, HTN, CAD, CABG (2015)    Limitations Walking;Standing;House hold activities    Patient Stated Goals wants to improve his balance and walking.    Currently in Pain? No/denies                               [facility] Adult PT Treatment/Exercise - 01/03/21 1330       Transfers   Transfers Stand to Sit;Sit to Stand    Comments x6 reps from elevated mat table on blue air ex with BUE support, cues for forward weight shift and for tall posture before controlled descent back down to mat  Berg Balance Test   Sit to Stand Able to stand  independently using hands    Standing Unsupported Able to stand safely 2 minutes    Sitting with Back Unsupported but Feet Supported on Floor or Stool Able to sit safely and securely 2 minutes    Stand to Sit Sits safely with minimal use of hands    Transfers Able to transfer safely, definite need of hands    Standing Unsupported with Eyes Closed Able to stand 10 seconds with supervision    Standing Ubsupported with Feet Together Needs help to attain position but able to stand for 30 seconds with feet together   45 seconds   From Standing, Reach Forward with Outstretched Arm Can reach forward >12 cm safely (5")   8"   From Standing Position, Pick up Object from Floor Able to pick up shoe, needs supervision    From Standing Position, Turn to Look Behind Over each Shoulder  Looks behind from both sides and weight shifts well    Turn 360 Degrees Needs close supervision or verbal cueing   14 seconds each direction   Standing Unsupported, Alternately Place Feet on Step/Stool Able to complete >2 steps/needs minimal assist    Standing Unsupported, One Foot in Front Able to take small step independently and hold 30 seconds    Standing on One Leg Tries to lift leg/unable to hold 3 seconds but remains standing independently   1-2 seconds   Total Score 37                 Balance Exercises - 01/03/21 1358       Balance Exercises: Standing   SLS Eyes open;Solid surface;2 reps;10 secs    SLS Limitations with BUE support on chair, cues for posture               PT Education - 01/03/21 1401     Education Details progress towards goals    Person(s) Educated Patient;Spouse    Methods Explanation    Comprehension Verbalized understanding                PT Short Term Goals - 01/03/21 1348       PT SHORT TERM GOAL #1   Title Patient will improve Berg Balance to >/= 30/56 to demonstrate improved balance and reduced fall risk. ALL STGS DUE 12/21/20    Baseline 26/56- did not have time to assess on 12/27/20, 37/56 on 01/03/21    Time 4    Period Weeks    Status Achieved    Target Date 12/21/20      PT SHORT TERM GOAL #2   Title Pt and pt's spouse will be independent with initial HEP in order to build upon functional gains made in therapy.    Baseline pt and pt's spouse reports that he has not been performing consistently    Time 4    Period Weeks    Status Partially Met      PT SHORT TERM GOAL #3   Title Pt will ambulate at least 17' with appropriate AD and supervision over indoor leel surfaces in order to demo improved household mobility.    Baseline needs min guard with RW, able to walk proper distance    Time 4    Period Weeks    Status Partially Met      PT SHORT TERM GOAL #4   Title Pt will improve gait speed to at least 2.0 ft/sec with  appropriate  AD in order to demo decr fall risk.    Baseline 1.73 ft/sec; 18.62 seconds = 1.76 ft/sec    Time 4    Period Weeks    Status Not Met               PT Long Term Goals - 01/03/21 1403       PT LONG TERM GOAL #1   Title Patient will improve Berg Balance to >/= 42/56 in order to demo decr fall risk. ALL LTGS DUE 01/18/21    Baseline 26/56; 37/56 on 01/03/21    Time 8    Period Weeks    Status Revised      PT LONG TERM GOAL #2   Title Pt will decr TUG time to 25 seconds or less with appropriate AD vs. no AD in order to demo decr fall risk.    Baseline 33.22 seconds with single HHA from therapist    Time 8    Period Weeks    Status New      PT LONG TERM GOAL #3   Title Pt will ambulate at least 200' over outdoor unlevel surfaces with LRAD with supervision in order to demo improved community mobility.    Time 8    Period Weeks    Status New      PT LONG TERM GOAL #4   Title Pt will improve gait speed to at least 2.5 ft/sec with appropriate AD in order to demo improved community mobility    Baseline 1.73 ft/sec with HHA from therapist    Time 8    Period Weeks    Status New      PT LONG TERM GOAL #5   Title Pt will perform 5x sit <> stand in 18 seconds or less in order to demo decr fall risk and improved functional BLE strength.    Baseline 23.63 seconds with single UE support/none    Time 8    Period Weeks    Status New      PT LONG TERM GOAL #6   Title Pt will improve FOTO score to at least a 72 in order to demo improved functional outcomes.    Baseline 58    Time 8    Period Weeks    Status New                   Plan - 01/03/21 1634     Clinical Impression Statement 10th visit progress note: Assessed BERG today with pt scoring a 37/56 and met STG #1 (previously scored a 26/56). From an elevated mat table, pt demonstrating improved functional BLE strength by being able to stand without UE support. With RW, pt still needs cues to focus on  staying inside of BOS, posture, and incr step length/height to avoid scuffing and still needing min guard at times. Pt's spouse has ordered pt a cubie for home to assist with BLE strengthening. Will continue to progress towards LTGs.    Personal Factors and Comorbidities Comorbidity 3+;Past/Current Experience    Comorbidities a fib, CVA,HLD, HTN, CAD, CABG (2015)    Examination-Activity Limitations Bathing;Dressing;Locomotion Level;Reach Overhead;Squat;Transfers;Stairs;Stand    Examination-Participation Restrictions Community Activity;Occupation;Cleaning    Stability/Clinical Decision Making Evolving/Moderate complexity    Rehab Potential Good    PT Frequency 2x / week    PT Duration 12 weeks   16 visits over 12 weeks   PT Treatment/Interventions ADLs/Self Care Home Management;DME Instruction;Gait training;Stair training;Functional mobility training;Therapeutic exercise;Therapeutic activities;Balance training;Neuromuscular re-education;Patient/family education;Orthotic  Fit/Training;Vestibular    PT Next Visit Plan Continue gait training with AD. RLE>LLE strengthening .trying NuStep/SciFit. standing balance with SLS, on compliant surfaces, head motions, eyes closed. monitor BP.    Consulted and Agree with Plan of Care Patient;Family member/caregiver    Family Member Consulted pt's wife [name]             Patient will benefit from skilled therapeutic intervention in order to improve the following deficits and impairments:  Abnormal gait, Decreased balance, Decreased activity tolerance, Decreased coordination, Decreased endurance, Decreased knowledge of use of DME, Decreased strength, Postural dysfunction  Visit Diagnosis: Other abnormalities of gait and mobility  Unsteadiness on feet  Muscle weakness (generalized)     Problem List Patient Active Problem List   Diagnosis Date Noted   Stroke (cerebrum) ([facility]) 11/04/2020   Hematoma of right thigh 11/19/2018   Symptomatic anemia 11/08/2018    Pseudophakia 03/06/2018   S/P laser cataract surgery 03/06/2018   AMD (age-related macular degeneration), bilateral 02/26/2018   Combined form of senile cataract 02/17/2018   Herpes zoster with complication 38/32/9191   UTI (urinary tract infection) 02/18/2016   Acute on chronic combined systolic and diastolic congestive heart failure ([facility]) 02/17/2016   CHF (congestive heart failure) ([facility]) 02/17/2016   Gait disturbance 02/17/2016   Near syncope 09/03/2015   Hyponatremia 09/03/2015   Bronchitis 08/29/2015   Encounter for therapeutic drug monitoring 12/12/2013   Anticoagulated on warfarin 12/10/2013   Anticoagulated 12/10/2013   Cardiomyopathy, ischemic- EF 40-45% 12/09/2013   Atrial fibrillation with RVR ([facility] [place]) 12/05/2013   CAD- last PCI [facility] yrs [facility] at 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 [place], [place], [address] Phone: [phone]   Fax:  [phone]  Name: Waldron Gerry MRN: [id number] Date of Birth: 1929-12-08

## 2021-01-05 ENCOUNTER — Ambulatory Visit: Payer: Medicare Other | Admitting: Physical Therapy

## 2021-01-05 ENCOUNTER — Other Ambulatory Visit: Payer: Self-pay

## 2021-01-05 ENCOUNTER — Encounter: Payer: Self-pay | Admitting: Physical Therapy

## 2021-01-05 VITALS — BP 176/98

## 2021-01-05 DIAGNOSIS — R2689 Other abnormalities of gait and mobility: Secondary | ICD-10-CM

## 2021-01-05 DIAGNOSIS — M6281 Muscle weakness (generalized): Secondary | ICD-10-CM

## 2021-01-05 DIAGNOSIS — R2681 Unsteadiness on feet: Secondary | ICD-10-CM

## 2021-01-05 DIAGNOSIS — R293 Abnormal posture: Secondary | ICD-10-CM | POA: Diagnosis not present

## 2021-01-06 NOTE — Therapy (Signed)
[facility] [address] [place], [place], [address] Phone: [phone]   Fax:  [phone]  Physical Therapy Treatment  Patient Details  Name: Edgar Thomas MRN: [id number] Date of Birth: [birth year] Referring Provider (PT): Dr. Jannifer Franklin   Encounter Date: 01/05/2021   PT End of Session - 01/05/21 1630     Visit Number 11    Number of Visits 17    Date for PT Re-Evaluation 02/20/21    Authorization Type BCBS Medicare    PT Start Time 6837    PT Stop Time 1400    PT Time Calculation (min) 43 min    Equipment Utilized During Treatment Gait belt    Activity Tolerance Patient tolerated treatment well    Behavior During Therapy WFL for tasks assessed/performed             Past Medical History:  Diagnosis Date   A-fib ([facility])    Anemia    CAD (coronary artery disease)    Constipation    GERD (gastroesophageal reflux disease)    Heart attack ([facility])    Heartburn    Hyperlipemia    Hypertension    Post herpetic neuralgia    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 LIMA to LAD, sequential SVG to OM 1 & OM 2, SVG to PD;  Surgeon: Grace Isaac, MD;  Location: [facility];  Service: Open Heart Surgery;  Laterality: N/A;   ENDOVEIN HARVEST OF GREATER SAPHENOUS VEIN Right 12/03/2013   Procedure: ENDOVEIN HARVEST OF GREATER SAPHENOUS VEIN;  Surgeon: Grace Isaac, MD;  Location: [facility];  Service: Open Heart Surgery;  Laterality: Right;   INTRAOPERATIVE TRANSESOPHAGEAL  ECHOCARDIOGRAM N/A 12/03/2013   Procedure: INTRAOPERATIVE TRANSESOPHAGEAL ECHOCARDIOGRAM;  Surgeon: Grace Isaac, MD;  Location: [facility];  Service: Open Heart Surgery;  Laterality: N/A;    Vitals:   01/05/21 1321 01/05/21 1338 01/05/21 1344 01/05/21 1354  BP: (!) 160/96 (!) 170/98 (!) 178/98 (!) 176/98     Subjective Assessment - 01/05/21 1320     Subjective No new complaints. No falls or pain to report.    Patient is accompained by: Family member   spouse [name]   Pertinent History PMH: a fib, CVA,HLD, HTN, CAD, CABG (2015)    Limitations Walking;Standing;House hold activities    Patient Stated Goals wants to improve his balance and walking.    Currently in Pain? No/denies                      [facility] Adult PT Treatment/Exercise - 01/05/21 1325       Transfers   Transfers Sit to Stand;Stand to Sit    Sit to Stand 4: Min guard;With upper extremity assist;From bed;From chair/3-in-1    Sit to Stand Details Verbal cues for technique;Verbal cues for precautions/safety;Verbal cues for safe use of DME/AE    Sit to Stand Details (indicate cue type and reason) cues for hand placement each time  Stand to Sit 4: Min guard;With upper extremity assist;To bed;To chair/3-in-1    Stand to Sit Details (indicate cue type and reason) Verbal cues for technique;Verbal cues for precautions/safety;Verbal cues for safe use of DME/AE    Stand to Sit Details cues to fully turn toward surface and to reach back each time      Ambulation/Gait   Ambulation/Gait Yes    Ambulation/Gait Assistance 4: Min guard    Ambulation/Gait Assistance Details cues for posture, walker proximity and to increase bil (right>left) step length with gait.    Ambulation Distance (Feet) 115 Feet   x1, plus around clinic   Assistive device Rolling walker    Gait Pattern Step-through pattern;Decreased arm swing - right;Decreased arm swing - left;Decreased stride length;Decreased dorsiflexion - right;Decreased dorsiflexion -  left;Poor foot clearance - left;Poor foot clearance - right;Trunk flexed    Ambulation Surface Level;Indoor      High Level Balance   High Level Balance Activities Negotitating around obstacles    High Level Balance Comments with use of RW: chair<> 3 cones in a line with space between them<>chair. had pt stand from one chair, weave around cones and turn 180* to sit into other chair. Performed this for 6 reps with cues on technique with sit<>stands, to stay with walker with turns around cones and with turning to sit in chair.      Knee/Hip Exercises: Aerobic   Other Aerobic Completed SciFit on Level 2.5 x 6 mintues, cues to keep pace above > 80 for improved endurance/strengthening.                      PT Short Term Goals - 01/03/21 1348       PT SHORT TERM GOAL #1   Title Patient will improve Berg Balance to >/= 30/56 to demonstrate improved balance and reduced fall risk. ALL STGS DUE 12/21/20    Baseline 26/56- did not have time to assess on 12/27/20, 37/56 on 01/03/21    Time 4    Period Weeks    Status Achieved    Target Date 12/21/20      PT SHORT TERM GOAL #2   Title Pt and pt's spouse will be independent with initial HEP in order to build upon functional gains made in therapy.    Baseline pt and pt's spouse reports that he has not been performing consistently    Time 4    Period Weeks    Status Partially Met      PT SHORT TERM GOAL #3   Title Pt will ambulate at least 56' with appropriate AD and supervision over indoor leel surfaces in order to demo improved household mobility.    Baseline needs min guard with RW, able to walk proper distance    Time 4    Period Weeks    Status Partially Met      PT SHORT TERM GOAL #4   Title Pt will improve gait speed to at least 2.0 ft/sec with appropriate AD in order to demo decr fall risk.    Baseline 1.73 ft/sec; 18.62 seconds = 1.76 ft/sec    Time 4    Period Weeks    Status Not Met               PT Long Term  Goals - 01/03/21 1403       PT LONG TERM GOAL #1   Title Patient will improve Berg Balance to >/= 42/56 in order to demo  decr fall risk. ALL LTGS DUE 01/18/21    Baseline 26/56; 37/56 on 01/03/21    Time 8    Period Weeks    Status Revised      PT LONG TERM GOAL #2   Title Pt will decr TUG time to 25 seconds or less with appropriate AD vs. no AD in order to demo decr fall risk.    Baseline 33.22 seconds with single HHA from therapist    Time 8    Period Weeks    Status New      PT LONG TERM GOAL #3   Title Pt will ambulate at least 200' over outdoor unlevel surfaces with LRAD with supervision in order to demo improved community mobility.    Time 8    Period Weeks    Status New      PT LONG TERM GOAL #4   Title Pt will improve gait speed to at least 2.5 ft/sec with appropriate AD in order to demo improved community mobility    Baseline 1.73 ft/sec with HHA from therapist    Time 8    Period Weeks    Status New      PT LONG TERM GOAL #5   Title Pt will perform 5x sit <> stand in 18 seconds or less in order to demo decr fall risk and improved functional BLE strength.    Baseline 23.63 seconds with single UE support/none    Time 8    Period Weeks    Status New      PT LONG TERM GOAL #6   Title Pt will improve FOTO score to at least a 72 in order to demo improved functional outcomes.    Baseline 58    Time 8    Period Weeks    Status New                   Plan - 01/05/21 1630     Clinical Impression Statement Today's skilled session continued to focus on strengthening/activity tolerance with Scifit and gait/transfers with RW. Pt continues to need cues on safe use of walker with transfers and gait. Pt's BP running on high side with session today. Pt's spouse aware and to continue to monitor at home. The pt is progressing and should benefit from continued PT to progress toward unmet goals.    Personal Factors and Comorbidities Comorbidity 3+;Past/Current Experience     Comorbidities a fib, CVA,HLD, HTN, CAD, CABG (2015)    Examination-Activity Limitations Bathing;Dressing;Locomotion Level;Reach Overhead;Squat;Transfers;Stairs;Stand    Examination-Participation Restrictions Community Activity;Occupation;Cleaning    Stability/Clinical Decision Making Evolving/Moderate complexity    Rehab Potential Good    PT Frequency 2x / week    PT Duration 12 weeks   16 visits over 12 weeks   PT Treatment/Interventions ADLs/Self Care Home Management;DME Instruction;Gait training;Stair training;Functional mobility training;Therapeutic exercise;Therapeutic activities;Balance training;Neuromuscular re-education;Patient/family education;Orthotic Fit/Training;Vestibular    PT Next Visit Plan Continue gait training with AD. RLE>LLE strengthening .trying NuStep/SciFit. standing balance with SLS, on compliant surfaces, head motions, eyes closed. monitor BP.    Consulted and Agree with Plan of Care Patient;Family member/caregiver    Family Member Consulted pt's wife [name]             Patient will benefit from skilled therapeutic intervention in order to improve the following deficits and impairments:  Abnormal gait, Decreased balance, Decreased activity tolerance, Decreased coordination, Decreased endurance, Decreased knowledge of use of DME, Decreased strength, Postural dysfunction  Visit Diagnosis: Other abnormalities  of gait and mobility  Unsteadiness on feet  Muscle weakness (generalized)     Problem List Patient Active Problem List   Diagnosis Date Noted   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   Hematoma of right thigh 11/19/2018   Symptomatic anemia 11/08/2018   Pseudophakia 03/06/2018   S/P laser cataract surgery 03/06/2018   AMD (age-related macular degeneration), bilateral 02/26/2018   Combined form of senile cataract 02/17/2018   Herpes zoster with complication 06/89/3406   UTI (urinary tract infection) 02/18/2016   Acute on chronic combined systolic and  diastolic congestive heart failure ([facility]) 02/17/2016   CHF (congestive heart failure) ([facility]) 02/17/2016   Gait disturbance 02/17/2016   Near syncope 09/03/2015   Hyponatremia 09/03/2015   Bronchitis 08/29/2015   Encounter for therapeutic drug monitoring 12/12/2013   Anticoagulated on warfarin 12/10/2013   Anticoagulated 12/10/2013   Cardiomyopathy, ischemic- EF 40-45% 12/09/2013   Atrial fibrillation with RVR ([place]) 12/05/2013   CAD- last PCI [facility] yrs ago at [facility] 12/05/2013   GERD (gastroesophageal reflux disease)    Hyperlipemia    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

## 2021-01-10 ENCOUNTER — Ambulatory Visit: Payer: Medicare Other | Admitting: Physical Therapy

## 2021-01-10 ENCOUNTER — Encounter: Payer: Self-pay | Admitting: Physical Therapy

## 2021-01-10 ENCOUNTER — Other Ambulatory Visit: Payer: Self-pay

## 2021-01-10 VITALS — BP 160/92

## 2021-01-10 DIAGNOSIS — M6281 Muscle weakness (generalized): Secondary | ICD-10-CM | POA: Diagnosis not present

## 2021-01-10 DIAGNOSIS — R2681 Unsteadiness on feet: Secondary | ICD-10-CM | POA: Diagnosis not present

## 2021-01-10 DIAGNOSIS — R293 Abnormal posture: Secondary | ICD-10-CM | POA: Diagnosis not present

## 2021-01-10 DIAGNOSIS — R2689 Other abnormalities of gait and mobility: Secondary | ICD-10-CM

## 2021-01-10 NOTE — Therapy (Signed)
[facility] [address] [place], [place], [address] Phone: [phone]   Fax:  [phone]  Physical Therapy Treatment  Patient Details  Name: Edgar Thomas MRN: [id number] Date of Birth: 25-Nov-1929 Referring Provider (PT): Dr. Jannifer Franklin   Encounter Date: 01/10/2021   PT End of Session - 01/10/21 1402     Visit Number 12    Number of Visits 17    Date for PT Re-Evaluation 02/20/21    Authorization Type BCBS Medicare    PT Start Time 1316    PT Stop Time 5374    PT Time Calculation (min) 42 min    Equipment Utilized During Treatment Gait belt    Activity Tolerance Patient tolerated treatment well    Behavior During Therapy WFL for tasks assessed/performed             Past Medical History:  Diagnosis Date   A-fib ([place])    Anemia    CAD (coronary artery disease)    Constipation    GERD (gastroesophageal reflux disease)    Heart attack ([place])    Heartburn    Hyperlipemia    Hypertension    Post herpetic neuralgia    Stroke (cerebrum) ([place]) 11/04/2020   Left lenticulostriate and external capsule   Stroke ([place])    TIA (transient ischemic attack)    Vitamin D deficiency     Past Surgical History:  Procedure Laterality Date   balloon angioplasty of LAD     CARDIOVERSION  2018   a fib   CATARACT EXTRACTION, BILATERAL  1019, 2022   COLONOSCOPY     CORONARY ARTERY BYPASS GRAFT N/A 12/03/2013   Procedure: CORONARY ARTERY BYPASS GRAFTING (CABG) x4 using left internal mammary artery and right greater saphenous vein. LIMA to LAD, sequential SVG to OM 1 & OM 2, SVG to PD;  Surgeon: Grace Isaac, MD;  Location: [facility];  Service: Open Heart Surgery;  Laterality: N/A;   ENDOVEIN HARVEST OF GREATER SAPHENOUS VEIN Right 12/03/2013   Procedure: ENDOVEIN HARVEST OF GREATER SAPHENOUS VEIN;  Surgeon: Grace Isaac, MD;  Location: [facility];  Service: Open Heart Surgery;  Laterality: Right;   INTRAOPERATIVE TRANSESOPHAGEAL  ECHOCARDIOGRAM N/A 12/03/2013   Procedure: INTRAOPERATIVE TRANSESOPHAGEAL ECHOCARDIOGRAM;  Surgeon: Grace Isaac, MD;  Location: [facility];  Service: Open Heart Surgery;  Laterality: N/A;    Vitals:   01/10/21 1324 01/10/21 1356  BP: (!) 155/90 (!) 160/92     Subjective Assessment - 01/10/21 1319     Subjective BP has been lower at home. No falls. Cubie came in today.    Patient is accompained by: Family member   spouse [name]   Pertinent History PMH: a fib, CVA,HLD, HTN, CAD, CABG (2015)    Limitations Walking;Standing;House hold activities    Patient Stated Goals wants to improve his balance and walking.    Currently in Pain? No/denies                               [facility] Adult PT Treatment/Exercise - 01/10/21 0001       Transfers   Transfers Sit to Stand;Stand to Sit    Sit to Stand 4: Min guard;With upper extremity assist;From bed;From chair/3-in-1    Sit to Stand Details Verbal cues for technique;Verbal cues for precautions/safety;Verbal cues for safe use of DME/AE    Sit to Stand Details (indicate cue type and reason) cues to push  off from mat table    Stand to Sit 4: Min guard;With upper extremity assist;To bed;To chair/3-in-1    Stand to Sit Details (indicate cue type and reason) Verbal cues for technique;Verbal cues for precautions/safety;Verbal cues for safe use of DME/AE    Stand to Sit Details cues to reach back towards mat      Ambulation/Gait   Ambulation/Gait Yes    Ambulation/Gait Assistance 4: Min guard    Ambulation/Gait Assistance Details into and out of session, cues for posture and incr foot clearance with RLE and staying inside of RW    Assistive device Rolling walker    Gait Pattern Step-through pattern;Decreased arm swing - right;Decreased arm swing - left;Decreased stride length;Decreased dorsiflexion - right;Decreased dorsiflexion - left;Poor foot clearance - left;Poor foot clearance - right;Trunk flexed    Ambulation Surface Level;Indoor                  Balance Exercises - 01/10/21 1338       Balance Exercises: Standing   Standing Eyes Opened Wide (BOA);Foam/compliant surface    Standing Eyes Opened Limitations standing on blue air ex, bean bag toss into crate - reaching with either UE towards R to grab bean bag from therapist, min guard and intermittent UE support for balance    Rockerboard Lateral;EO;Limitations    Rockerboard Limitations weight shifting with BUE support to R/L with cues for upright posture and using the mirror x12 reps, then attempting to hold board 2 x 30 seconds with intermittent UE support as needed    Retro Gait Upper extremity support;4 reps;Limitations    Retro Gait Limitations down and back in // bars - cues throughout for step length and weight shifting, attemping 1 rep with no UE support with pt reverting to small shuffled steps   Other Standing Exercises alternating lateral stepping over 4" beam x10 reps alternating B with BUE support, x10 reps B alternating forward stepping over 2" beam to targets - cues throughout for incr foot clearance with RLE               PT Education - 01/10/21 1400     Education Details continuing to monitor BP at home (a couple times a day) and to record to bring to PCP appt in a couple weeks    Person(s) Educated Patient;Spouse    Methods Explanation    Comprehension Verbalized understanding              PT Short Term Goals - 01/03/21 1348       PT SHORT TERM GOAL #1   Title Patient will improve Berg Balance to >/= 30/56 to demonstrate improved balance and reduced fall risk. ALL STGS DUE 12/21/20    Baseline 26/56- did not have time to assess on 12/27/20, 37/56 on 01/03/21    Time 4    Period Weeks    Status Achieved    Target Date 12/21/20      PT SHORT TERM GOAL #2   Title Pt and pt's spouse will be independent with initial HEP in order to build upon functional gains made in therapy.    Baseline pt and pt's spouse reports that he has not  been performing consistently    Time 4    Period Weeks    Status Partially Met      PT SHORT TERM GOAL #3   Title Pt will ambulate at least 31' with appropriate AD and supervision over indoor leel surfaces in order to demo  improved household mobility.    Baseline needs min guard with RW, able to walk proper distance    Time 4    Period Weeks    Status Partially Met      PT SHORT TERM GOAL #4   Title Pt will improve gait speed to at least 2.0 ft/sec with appropriate AD in order to demo decr fall risk.    Baseline 1.73 ft/sec; 18.62 seconds = 1.76 ft/sec    Time 4    Period Weeks    Status Not Met               PT Long Term Goals - 01/03/21 1403       PT LONG TERM GOAL #1   Title Patient will improve Berg Balance to >/= 42/56 in order to demo decr fall risk. ALL LTGS DUE 01/18/21    Baseline 26/56; 37/56 on 01/03/21    Time 8    Period Weeks    Status Revised      PT LONG TERM GOAL #2   Title Pt will decr TUG time to 25 seconds or less with appropriate AD vs. no AD in order to demo decr fall risk.    Baseline 33.22 seconds with single HHA from therapist    Time 8    Period Weeks    Status New      PT LONG TERM GOAL #3   Title Pt will ambulate at least 200' over outdoor unlevel surfaces with LRAD with supervision in order to demo improved community mobility.    Time 8    Period Weeks    Status New      PT LONG TERM GOAL #4   Title Pt will improve gait speed to at least 2.5 ft/sec with appropriate AD in order to demo improved community mobility    Baseline 1.73 ft/sec with HHA from therapist    Time 8    Period Weeks    Status New      PT LONG TERM GOAL #5   Title Pt will perform 5x sit <> stand in 18 seconds or less in order to demo decr fall risk and improved functional BLE strength.    Baseline 23.63 seconds with single UE support/none    Time 8    Period Weeks    Status New      PT LONG TERM GOAL #6   Title Pt will improve FOTO score to at least a 72 in  order to demo improved functional outcomes.    Baseline 58    Time 8    Period Weeks    Status New                   Plan - 01/10/21 1406     Clinical Impression Statement Today's skilled session focused on standing balance strategies on compliant surfaces and for foot clearance with RLE and SLS. Pt tolerated session well, does need cues throughout activities for proper technique. BP WFL for therapy today, but diastolic still running on the high side. Pt's spouse to continue to monitor at home. Will continue to progress towards LTGs.    Personal Factors and Comorbidities Comorbidity 3+;Past/Current Experience    Comorbidities a fib, CVA,HLD, HTN, CAD, CABG (2015)    Examination-Activity Limitations Bathing;Dressing;Locomotion Level;Reach Overhead;Squat;Transfers;Stairs;Stand    Examination-Participation Restrictions Community Activity;Occupation;Cleaning    Stability/Clinical Decision Making Evolving/Moderate complexity    Rehab Potential Good    PT Frequency 2x / week  PT Duration 12 weeks   16 visits over 12 weeks   PT Treatment/Interventions ADLs/Self Care Home Management;DME Instruction;Gait training;Stair training;Functional mobility training;Therapeutic exercise;Therapeutic activities;Balance training;Neuromuscular re-education;Patient/family education;Orthotic Fit/Training;Vestibular    PT Next Visit Plan Continue gait training with AD and transfer training. RLE>LLE strengthening. foot clearance with RLE. continue NuStep/SciFit. standing balance with SLS, on compliant surfaces, head motions, eyes closed. monitor BP.    Consulted and Agree with Plan of Care Patient;Family member/caregiver    Family Member Consulted pt's wife [name]             Patient will benefit from skilled therapeutic intervention in order to improve the following deficits and impairments:  Abnormal gait, Decreased balance, Decreased activity tolerance, Decreased coordination, Decreased endurance,  Decreased knowledge of use of DME, Decreased strength, Postural dysfunction  Visit Diagnosis: Other abnormalities of gait and mobility  Muscle weakness (generalized)  Unsteadiness on feet     Problem List Patient Active Problem List   Diagnosis Date Noted   Stroke (cerebrum) ([place]) 11/04/2020   Hematoma of right thigh 11/19/2018   Symptomatic anemia 11/08/2018   Pseudophakia 03/06/2018   S/P laser cataract surgery 03/06/2018   AMD (age-related macular degeneration), bilateral 02/26/2018   Combined form of senile cataract 02/17/2018   Herpes zoster with complication 00/71/2197   UTI (urinary tract infection) 02/18/2016   Acute on chronic combined systolic and diastolic congestive heart failure ([place]) 02/17/2016   CHF (congestive heart failure) ([place]) 02/17/2016   Gait disturbance 02/17/2016   Near syncope 09/03/2015   Hyponatremia 09/03/2015   Bronchitis 08/29/2015   Encounter for therapeutic drug monitoring 12/12/2013   Anticoagulated on warfarin 12/10/2013   Anticoagulated 12/10/2013   Cardiomyopathy, ischemic- EF 40-45% 12/09/2013   Atrial fibrillation with RVR ([place]) 12/05/2013   CAD- last PCI [facility] yrs [facility] at [facility] 12/05/2013   GERD (gastroesophageal reflux disease)    Hyperlipemia    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 [address] [place], [place], [address] Phone: [phone]   Fax:  [phone]  Name: Edgar Thomas MRN: [id number] Date of Birth: [birth year]

## 2021-01-12 ENCOUNTER — Other Ambulatory Visit: Payer: Self-pay

## 2021-01-12 ENCOUNTER — Ambulatory Visit: Payer: Medicare Other | Admitting: Physical Therapy

## 2021-01-12 VITALS — BP 174/90

## 2021-01-12 DIAGNOSIS — M6281 Muscle weakness (generalized): Secondary | ICD-10-CM

## 2021-01-12 DIAGNOSIS — R2689 Other abnormalities of gait and mobility: Secondary | ICD-10-CM

## 2021-01-12 DIAGNOSIS — R293 Abnormal posture: Secondary | ICD-10-CM | POA: Diagnosis not present

## 2021-01-12 DIAGNOSIS — R2681 Unsteadiness on feet: Secondary | ICD-10-CM

## 2021-01-12 NOTE — Therapy (Signed)
[facility] [address] [place], [place], [address] Phone: [phone]   Fax:  [phone]  Physical Therapy Treatment  Patient Details  Name: Edgar Thomas MRN: [id number] Date of Birth: April 04, 1930 Referring Provider (PT): Dr. Jannifer Franklin   Encounter Date: 01/12/2021   PT End of Session - 01/12/21 1326     Visit Number 13    Number of Visits 17    Date for PT Re-Evaluation 02/20/21    Authorization Type BCBS Medicare    PT Start Time 7672   pt running late   PT Stop Time 1400    PT Time Calculation (min) 37 min    Equipment Utilized During Treatment Gait belt    Activity Tolerance Patient tolerated treatment well    Behavior During Therapy WFL for tasks assessed/performed             Past Medical History:  Diagnosis Date   A-fib ([place])    Anemia    CAD (coronary artery disease)    Constipation    GERD (gastroesophageal reflux disease)    Heart attack ([place])    Heartburn    Hyperlipemia    Hypertension    Post herpetic neuralgia    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 LIMA to LAD, sequential SVG to OM 1 & OM 2, SVG to PD;  Surgeon: Grace Isaac, MD;  Location: [facility];  Service: Open Heart Surgery;  Laterality: N/A;   ENDOVEIN HARVEST OF GREATER SAPHENOUS VEIN Right 12/03/2013   Procedure: ENDOVEIN HARVEST OF GREATER SAPHENOUS VEIN;  Surgeon: Grace Isaac, MD;  Location: [facility];  Service: Open Heart Surgery;  Laterality: Right;   INTRAOPERATIVE  TRANSESOPHAGEAL ECHOCARDIOGRAM N/A 12/03/2013   Procedure: INTRAOPERATIVE TRANSESOPHAGEAL ECHOCARDIOGRAM;  Surgeon: Grace Isaac, MD;  Location: [facility];  Service: Open Heart Surgery;  Laterality: N/A;    Vitals:   01/12/21 1329 01/12/21 1351  BP: (!) 174/94 (!) 174/90     Subjective Assessment - 01/12/21 1325     Subjective No new complaints. Has been using the Cubie at home. Denies any pain or falls.    Patient is accompained by: Family member   [name]   Pertinent History PMH: a fib, CVA,HLD, HTN, CAD, CABG (2015)    Limitations Walking;Standing;House hold activities    Currently in Pain? No/denies                    [facility] Adult PT Treatment/Exercise - 01/12/21 1330       Transfers   Transfers Sit to Stand;Stand to Sit    Sit to Stand 4: Min guard;With upper extremity assist;From bed;From chair/3-in-1    Sit to Stand Details Verbal cues for technique;Verbal cues for precautions/safety;Verbal cues for safe use of DME/AE    Sit to Stand Details (indicate cue type and reason) continued cues to scoot to edge of surface and for proper hand placement    Stand to Sit 4:  Min guard;With upper extremity assist;To bed;To chair/3-in-1    Stand to Sit Details (indicate cue type and reason) Verbal cues for technique;Verbal cues for precautions/safety;Verbal cues for safe use of DME/AE    Stand to Sit Details cues to reach back with sitting down after fully turning to ensure at the suface      Ambulation/Gait   Ambulation/Gait Yes    Ambulation/Gait Assistance 4: Min guard    Ambulation/Gait Assistance Details cues to stay with walker and for increased step length    Ambulation Distance (Feet) 230 Feet   x1, plus around clinic with session   Assistive device Rolling walker    Gait Pattern Step-through pattern;Decreased arm swing - right;Decreased arm swing - left;Decreased stride length;Decreased dorsiflexion - right;Decreased dorsiflexion - left;Poor foot clearance - left;Poor  foot clearance - right;Trunk flexed    Ambulation Surface Level;Indoor      Neuro Re-ed    Neuro Re-ed Details  for balance/NMR: standing with HHA- alternating forward/backward stepping over black foam beam for ~10 reps each side with mod HHA. cues for increasesd step height and length to clear beam surface.                 Balance Exercises - 01/12/21 1336       Balance Exercises: Standing   SLS with Vectors Solid surface;Upper extremity assist 1;Other reps (comment);Limitations    SLS with Vectors Limitations on solid floor with 2 foam bubbles- with min HHA on one side alternating forward foot taps, then cross foot taps for ~10 reps with emphasis on light foot taps. cues to maintain wide base of support and for weight shifting.    Rockerboard Anterior/posterior;Lateral;EO;Head turns;30 seconds;Other reps (comment);Limitations    Rockerboard Limitations performed both ways on balance board: rocking the board with emphasis on tall posture with EO, min assist with cues/facilitation for posture with UE support initially progressing to no UE support; then holding the board steady for EO head movements left<>right, up<>down with min assist, cues for no UE support and posture.                 PT Short Term Goals - 01/03/21 1348       PT SHORT TERM GOAL #1   Title Patient will improve Berg Balance to >/= 30/56 to demonstrate improved balance and reduced fall risk. ALL STGS DUE 12/21/20    Baseline 26/56- did not have time to assess on 12/27/20, 37/56 on 01/03/21    Time 4    Period Weeks    Status Achieved    Target Date 12/21/20      PT SHORT TERM GOAL #2   Title Pt and pt's spouse will be independent with initial HEP in order to build upon functional gains made in therapy.    Baseline pt and pt's spouse reports that he has not been performing consistently    Time 4    Period Weeks    Status Partially Met      PT SHORT TERM GOAL #3   Title Pt will ambulate at least 21' with  appropriate AD and supervision over indoor leel surfaces in order to demo improved household mobility.    Baseline needs min guard with RW, able to walk proper distance    Time 4    Period Weeks    Status Partially Met      PT SHORT TERM GOAL #4   Title Pt will improve gait speed to at least 2.0 ft/sec with appropriate  AD in order to demo decr fall risk.    Baseline 1.73 ft/sec; 18.62 seconds = 1.76 ft/sec    Time 4    Period Weeks    Status Not Met               PT Long Term Goals - 01/03/21 1403       PT LONG TERM GOAL #1   Title Patient will improve Berg Balance to >/= 42/56 in order to demo decr fall risk. ALL LTGS DUE 01/18/21    Baseline 26/56; 37/56 on 01/03/21    Time 8    Period Weeks    Status Revised      PT LONG TERM GOAL #2   Title Pt will decr TUG time to 25 seconds or less with appropriate AD vs. no AD in order to demo decr fall risk.    Baseline 33.22 seconds with single HHA from therapist    Time 8    Period Weeks    Status New      PT LONG TERM GOAL #3   Title Pt will ambulate at least 200' over outdoor unlevel surfaces with LRAD with supervision in order to demo improved community mobility.    Time 8    Period Weeks    Status New      PT LONG TERM GOAL #4   Title Pt will improve gait speed to at least 2.5 ft/sec with appropriate AD in order to demo improved community mobility    Baseline 1.73 ft/sec with HHA from therapist    Time 8    Period Weeks    Status New      PT LONG TERM GOAL #5   Title Pt will perform 5x sit <> stand in 18 seconds or less in order to demo decr fall risk and improved functional BLE strength.    Baseline 23.63 seconds with single UE support/none    Time 8    Period Weeks    Status New      PT LONG TERM GOAL #6   Title Pt will improve FOTO score to at least a 72 in order to demo improved functional outcomes.    Baseline 58    Time 8    Period Weeks    Status New                   Plan - 01/12/21 1327      Clinical Impression Statement Today's skilled session continued to focus on transfers, gait with RW and balance training with BP WFL's for session. No other issues noted or reported by patient. The pt is making steady progress toward goals and should benefit from continued PT to progress toward unmet goals.    Personal Factors and Comorbidities Comorbidity 3+;Past/Current Experience    Comorbidities a fib, CVA,HLD, HTN, CAD, CABG (2015)    Examination-Activity Limitations Bathing;Dressing;Locomotion Level;Reach Overhead;Squat;Transfers;Stairs;Stand    Examination-Participation Restrictions Community Activity;Occupation;Cleaning    Stability/Clinical Decision Making Evolving/Moderate complexity    Rehab Potential Good    PT Frequency 2x / week    PT Duration 12 weeks   16 visits over 12 weeks   PT Treatment/Interventions ADLs/Self Care Home Management;DME Instruction;Gait training;Stair training;Functional mobility training;Therapeutic exercise;Therapeutic activities;Balance training;Neuromuscular re-education;Patient/family education;Orthotic Fit/Training;Vestibular    PT Next Visit Plan monitor BP. LTGs due 01/18/21.    Consulted and Agree with Plan of Care Patient;Family member/caregiver    Family Member Consulted pt's wife [name]  Patient will benefit from skilled therapeutic intervention in order to improve the following deficits and impairments:  Abnormal gait, Decreased balance, Decreased activity tolerance, Decreased coordination, Decreased endurance, Decreased knowledge of use of DME, Decreased strength, Postural dysfunction  Visit Diagnosis: Other abnormalities of gait and mobility  Muscle weakness (generalized)  Unsteadiness on feet     Problem List Patient Active Problem List   Diagnosis Date Noted   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   Hematoma of right thigh 11/19/2018   Symptomatic anemia 11/08/2018   Pseudophakia 03/06/2018   S/P laser cataract  surgery 03/06/2018   AMD (age-related macular degeneration), bilateral 02/26/2018   Combined form of senile cataract 02/17/2018   Herpes zoster with complication 02/18/3006   UTI (urinary tract infection) 02/18/2016   Acute on chronic combined systolic and diastolic congestive heart failure ([place]) 02/17/2016   CHF (congestive heart failure) ([place]) 02/17/2016   Gait disturbance 02/17/2016   Near syncope 09/03/2015   Hyponatremia 09/03/2015   Bronchitis 08/29/2015   Encounter for therapeutic drug monitoring 12/12/2013   Anticoagulated on warfarin 12/10/2013   Anticoagulated 12/10/2013   Cardiomyopathy, ischemic- EF 40-45% 12/09/2013   Atrial fibrillation with RVR ([place]) 12/05/2013   CAD- last PCI [facility] yrs ago at [facility] 12/05/2013   GERD (gastroesophageal reflux disease)    Hyperlipemia    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, [place] [address] [phone] 01/13/21, 9:28 AM   Name: Schon Zeiders MRN: [id number] Date of Birth: 1930-04-24

## 2021-01-17 ENCOUNTER — Ambulatory Visit: Payer: Medicare Other | Admitting: Physical Therapy

## 2021-01-17 ENCOUNTER — Encounter: Payer: Self-pay | Admitting: Physical Therapy

## 2021-01-17 ENCOUNTER — Other Ambulatory Visit: Payer: Self-pay

## 2021-01-17 VITALS — BP 155/92

## 2021-01-17 DIAGNOSIS — M6281 Muscle weakness (generalized): Secondary | ICD-10-CM

## 2021-01-17 DIAGNOSIS — R2689 Other abnormalities of gait and mobility: Secondary | ICD-10-CM

## 2021-01-17 DIAGNOSIS — R2681 Unsteadiness on feet: Secondary | ICD-10-CM | POA: Diagnosis not present

## 2021-01-17 DIAGNOSIS — R293 Abnormal posture: Secondary | ICD-10-CM | POA: Diagnosis not present

## 2021-01-17 NOTE — Therapy (Signed)
[facility] [address] [place], [place], [address] Phone: [phone]   Fax:  [phone]  Physical Therapy Treatment  Patient Details  Name: Edgar Thomas MRN: [id number] Date of Birth: 01-04-1930 Referring Provider (PT): Dr. Jannifer Franklin   Encounter Date: 01/17/2021   PT End of Session - 01/17/21 1400     Visit Number 14    Number of Visits 17    Date for PT Re-Evaluation 02/20/21    Authorization Type BCBS Medicare    PT Start Time 9628    PT Stop Time 3662    PT Time Calculation (min) 40 min    Equipment Utilized During Treatment Gait belt    Activity Tolerance Patient tolerated treatment well    Behavior During Therapy WFL for tasks assessed/performed             Past Medical History:  Diagnosis Date   A-fib ([place])    Anemia    CAD (coronary artery disease)    Constipation    GERD (gastroesophageal reflux disease)    Heart attack ([place])    Heartburn    Hyperlipemia    Hypertension    Post herpetic neuralgia    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 LIMA to LAD, sequential SVG to OM 1 & OM 2, SVG to PD;  Surgeon: Grace Isaac, MD;  Location: [facility];  Service: Open Heart Surgery;  Laterality: N/A;   ENDOVEIN HARVEST OF GREATER SAPHENOUS VEIN Right 12/03/2013   Procedure: ENDOVEIN HARVEST OF GREATER SAPHENOUS VEIN;  Surgeon: Grace Isaac, MD;  Location: [facility];  Service: Open Heart Surgery;  Laterality: Right;   INTRAOPERATIVE TRANSESOPHAGEAL  ECHOCARDIOGRAM N/A 12/03/2013   Procedure: INTRAOPERATIVE TRANSESOPHAGEAL ECHOCARDIOGRAM;  Surgeon: Grace Isaac, MD;  Location: [facility];  Service: Open Heart Surgery;  Laterality: N/A;    Vitals:   01/17/21 1323 01/17/21 1341  BP: (!) 150/90 (!) 155/92     Subjective Assessment - 01/17/21 1318     Subjective No falls. Has been using his Cubie at home.    Patient is accompained by: Family member   spouse [name]   Pertinent History PMH: a fib, CVA,HLD, HTN, CAD, CABG (2015)    Limitations Walking;Standing;House hold activities    Patient Stated Goals wants to improve his balance and walking.    Currently in Pain? No/denies                [facility] PT Assessment - 01/17/21 1327       Timed Up and Go Test   Normal TUG (seconds) 22.72    TUG Comments with RW                           [facility] Adult PT Treatment/Exercise - 01/17/21 1327       Transfers   Transfers Sit to Stand;Stand to Sit    Sit to Stand 4: Min guard;With upper extremity  assist;From bed;From chair/3-in-1    Sit to Stand Details Verbal cues for technique;Verbal cues for precautions/safety;Verbal cues for safe use of DME/AE    Sit to Stand Details (indicate cue type and reason) needs initial cues to push up from mat vs. RW when performing sit > stand    Five time sit to stand comments  21.5 seconds with BUE support/single UE support    Stand to Sit 4: Min guard;With upper extremity assist;To bed;To chair/3-in-1    Stand to Sit Details (indicate cue type and reason) Verbal cues for technique;Verbal cues for precautions/safety;Verbal cues for safe use of DME/AE    Number of Reps 2 sets;Other reps (comment)   5 reps     Ambulation/Gait   Ambulation/Gait Yes    Ambulation/Gait Assistance 4: Min guard    Ambulation/Gait Assistance Details cues for incr step length B throughout, staying inside BOS of RW, and for posture    Ambulation Distance (Feet) 115 Feet   plus additional clinic distances between  activities   Assistive device Rolling walker    Gait Pattern Step-through pattern;Decreased arm swing - right;Decreased arm swing - left;Decreased stride length;Decreased dorsiflexion - right;Decreased dorsiflexion - left;Poor foot clearance - left;Poor foot clearance - right;Trunk flexed    Ambulation Surface Level;Indoor    Gait velocity 14.8 seconds = 2.21 ft/sec      Therapeutic Activites    Therapeutic Activities Other Therapeutic Activities    Other Therapeutic Activities discussed POC going forwards as this is week 8 in pt's POC, discussed that due to progress with goals and continued areas to work on with therapy with re-cert for an additional 2x week for 4 weeks, pt and pt's spouse in agreement. continued to discuss monitoring pt's BP as it has been high but still [facility] for therapy. pt's spouse to make pt an appt with his PCP and cardiologist                 Balance Exercises - 01/17/21 1354       Balance Exercises: Standing   Standing Eyes Opened Foam/compliant surface;Limitations    Standing Eyes Opened Limitations feet closer than hip width distance on blue air ex; 3 x 30 seconds with intermittent taps to bars for balance    Standing Eyes Closed Wide (BOA);Foam/compliant surface    Standing Eyes Closed Limitations standing on blue air ex, 4 x 15-20 seconds with intermittent taps to bars for balance    SLS with Vectors Solid surface;Intermittent upper extremity assist;Upper extremity assist 2;Limitations    SLS with Vectors Limitations alternating toe taps to 2 cones with BUE support, cues for incr hip/knee flexion x10 reps each leg, performed alternating toe taps toe taps to blue air ex - trying to perform without UE support with HHA as needed for balance x10 reps               PT Education - 01/17/21 1359     Education Details see TA    Person(s) Educated Patient;Spouse    Methods Explanation    Comprehension Verbalized understanding              PT Short  Term Goals - 01/03/21 1348       PT SHORT TERM GOAL #1   Title Patient will improve Berg Balance to >/= 30/56 to demonstrate improved balance and reduced fall risk. ALL STGS DUE 12/21/20    Baseline 26/56- did not have time to assess on 12/27/20, 37/56 on 01/03/21  Time 4    Period Weeks    Status Achieved    Target Date 12/21/20      PT SHORT TERM GOAL #2   Title Pt and pt's spouse will be independent with initial HEP in order to build upon functional gains made in therapy.    Baseline pt and pt's spouse reports that he has not been performing consistently    Time 4    Period Weeks    Status Partially Met      PT SHORT TERM GOAL #3   Title Pt will ambulate at least 63' with appropriate AD and supervision over indoor leel surfaces in order to demo improved household mobility.    Baseline needs min guard with RW, able to walk proper distance    Time 4    Period Weeks    Status Partially Met      PT SHORT TERM GOAL #4   Title Pt will improve gait speed to at least 2.0 ft/sec with appropriate AD in order to demo decr fall risk.    Baseline 1.73 ft/sec; 18.62 seconds = 1.76 ft/sec    Time 4    Period Weeks    Status Not Met               PT Long Term Goals - 01/17/21 1328       PT LONG TERM GOAL #1   Title Patient will improve Berg Balance to >/= 42/56 in order to demo decr fall risk. ALL LTGS DUE 01/18/21    Baseline 26/56; 37/56 on 01/03/21    Time 8    Period Weeks    Status Revised      PT LONG TERM GOAL #2   Title Pt will decr TUG time to 25 seconds or less with appropriate AD vs. no AD in order to demo decr fall risk.    Baseline 33.22 seconds with single HHA from therapist; 22.72 seconds on 01/17/21 with RW    Time 8    Period Weeks    Status Achieved      PT LONG TERM GOAL #3   Title Pt will ambulate at least 200' over outdoor unlevel surfaces with LRAD with supervision in order to demo improved community mobility.    Time 8    Period Weeks    Status New       PT LONG TERM GOAL #4   Title Pt will improve gait speed to at least 2.5 ft/sec with appropriate AD in order to demo improved community mobility    Baseline 1.73 ft/sec with HHA from therapist; 2.21 ft/sec on 01/17/21    Time 8    Period Weeks    Status Not Met      PT LONG TERM GOAL #5   Title Pt will perform 5x sit <> stand in 18 seconds or less in order to demo decr fall risk and improved functional BLE strength.    Baseline 23.63 seconds with single UE support/none; 21.5 seconds on 01/17/21    Time 8    Period Weeks    Status Not Met      PT LONG TERM GOAL #6   Title Pt will improve FOTO score to at least a 72 in order to demo improved functional outcomes.    Baseline 58    Time 8    Period Weeks    Status New  Plan - 01/17/21 1406     Clinical Impression Statement Began to check pt's LTGs today. Pt met LTG #2 - improved TUG to 22.72 seconds today with RW (previously was 33.22 seconds with HHA). Pt improved gait speed to 2.21 ft/sec with RW, but not quite to goal level (previously was 1.73 ft/sec). Pt improved 5x sit <> stand with BUE support to 21.5 seconds (previously 23.63seconds), but not quite to goal level. Remainder of session focused on standing balance strategies working on SLS and compliant surfaces and trying to decr UE support. Will continue to progress towards LTGs.    Personal Factors and Comorbidities Comorbidity 3+;Past/Current Experience    Comorbidities a fib, CVA,HLD, HTN, CAD, CABG (2015)    Examination-Activity Limitations Bathing;Dressing;Locomotion Level;Reach Overhead;Squat;Transfers;Stairs;Stand    Examination-Participation Restrictions Community Activity;Occupation;Cleaning    Stability/Clinical Decision Making Evolving/Moderate complexity    Rehab Potential Good    PT Frequency 2x / week    PT Duration 12 weeks   16 visits over 12 weeks   PT Treatment/Interventions ADLs/Self Care Home Management;DME Instruction;Gait  training;Stair training;Functional mobility training;Therapeutic exercise;Therapeutic activities;Balance training;Neuromuscular re-education;Patient/family education;Orthotic Fit/Training;Vestibular    PT Next Visit Plan monitor BP. LTGs due 01/18/21. plan to re-cert for an additional month.    Consulted and Agree with Plan of Care Patient;Family member/caregiver    Family Member Consulted pt's wife [name]             Patient will benefit from skilled therapeutic intervention in order to improve the following deficits and impairments:  Abnormal gait, Decreased balance, Decreased activity tolerance, Decreased coordination, Decreased endurance, Decreased knowledge of use of DME, Decreased strength, Postural dysfunction  Visit Diagnosis: Other abnormalities of gait and mobility  Muscle weakness (generalized)  Unsteadiness on feet     Problem List Patient Active Problem List   Diagnosis Date Noted   Stroke (cerebrum) ([facility]) 11/04/2020   Hematoma of right thigh 11/19/2018   Symptomatic anemia 11/08/2018   Pseudophakia 03/06/2018   S/P laser cataract surgery 03/06/2018   AMD (age-related macular degeneration), bilateral 02/26/2018   Combined form of senile cataract 02/17/2018   Herpes zoster with complication 91/79/1505   UTI (urinary tract infection) 02/18/2016   Acute on chronic combined systolic and diastolic congestive heart failure ([place]) 02/17/2016   CHF (congestive heart failure) ([place]) 02/17/2016   Gait disturbance 02/17/2016   Near syncope 09/03/2015   Hyponatremia 09/03/2015   Bronchitis 08/29/2015   Encounter for therapeutic drug monitoring 12/12/2013   Anticoagulated on warfarin 12/10/2013   Anticoagulated 12/10/2013   Cardiomyopathy, ischemic- EF 40-45% 12/09/2013   Atrial fibrillation with RVR ([place]) 12/05/2013   CAD- last PCI [facility] yrs ago at [facility] 12/05/2013   GERD (gastroesophageal reflux disease)    Hyperlipemia    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 [place], [place], [address] Phone: [phone]   Fax:  [phone]  Name: Edgar Thomas MRN: [id number] Date of Birth: 10/06/1929

## 2021-01-19 ENCOUNTER — Encounter: Payer: Self-pay | Admitting: Physical Therapy

## 2021-01-19 ENCOUNTER — Ambulatory Visit: Payer: Medicare Other | Admitting: Physical Therapy

## 2021-01-19 ENCOUNTER — Other Ambulatory Visit: Payer: Self-pay

## 2021-01-19 VITALS — BP 140/80

## 2021-01-19 DIAGNOSIS — R2689 Other abnormalities of gait and mobility: Secondary | ICD-10-CM

## 2021-01-19 DIAGNOSIS — R2681 Unsteadiness on feet: Secondary | ICD-10-CM | POA: Diagnosis not present

## 2021-01-19 DIAGNOSIS — R293 Abnormal posture: Secondary | ICD-10-CM | POA: Diagnosis not present

## 2021-01-19 DIAGNOSIS — M6281 Muscle weakness (generalized): Secondary | ICD-10-CM

## 2021-01-19 NOTE — Therapy (Signed)
[facility] [address] [place], [place], [address] Phone: [phone]   Fax:  [phone]  Physical Therapy Treatment/RE-Cert  Patient Details  Name: Edgar Thomas MRN: [id number] Date of Birth: 23-May-1930 Referring Provider (PT): Dr. Jannifer Franklin   Encounter Date: 01/19/2021   PT End of Session - 01/19/21 1520     Visit Number 15    Number of Visits 23    Date for PT Re-Evaluation 02/18/21    Authorization Type BCBS Medicare    Progress Note Due on Visit 20    PT Start Time 1317    PT Stop Time 9163    PT Time Calculation (min) 41 min    Equipment Utilized During Treatment Gait belt    Activity Tolerance Patient tolerated treatment well    Behavior During Therapy WFL for tasks assessed/performed             Past Medical History:  Diagnosis Date   A-fib ([place])    Anemia    CAD (coronary artery disease)    Constipation    GERD (gastroesophageal reflux disease)    Heart attack ([place])    Heartburn    Hyperlipemia    Hypertension    Post herpetic neuralgia    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 LIMA to LAD, sequential SVG to OM 1 & OM 2, SVG to PD;  Surgeon: Grace Isaac, MD;  Location: [facility];  Service: Open Heart Surgery;  Laterality: N/A;   ENDOVEIN HARVEST OF GREATER SAPHENOUS VEIN Right 12/03/2013   Procedure: ENDOVEIN HARVEST OF GREATER SAPHENOUS VEIN;  Surgeon: Grace Isaac, MD;  Location: [facility];  Service: Open Heart Surgery;  Laterality:  Right;   INTRAOPERATIVE TRANSESOPHAGEAL ECHOCARDIOGRAM N/A 12/03/2013   Procedure: INTRAOPERATIVE TRANSESOPHAGEAL ECHOCARDIOGRAM;  Surgeon: Grace Isaac, MD;  Location: [facility];  Service: Open Heart Surgery;  Laterality: N/A;    Vitals:   01/19/21 1322  BP: 140/80     Subjective Assessment - 01/19/21 1319     Subjective Had a fall the other day. Got up and was using no AD and thinks he missed the chair. Wife was able to help get him up safely.    Patient is accompained by: Family member   [name]   Pertinent History PMH: a fib, CVA,HLD, HTN, CAD, CABG (2015)    Limitations Walking;Standing;House hold activities    Patient Stated Goals wants to improve his balance and walking.    Currently in Pain? No/denies                [facility] PT Assessment - 01/19/21 1325       Assessment   Medical Diagnosis CVA/gait abnormality    Referring Provider (PT) Dr. Jannifer Franklin    Onset Date/Surgical Date 10/26/20      Timed Up and Go Test   Normal TUG (seconds) 28.06   no AD  [facility] Adult PT Treatment/Exercise - 01/19/21 1328       Transfers   Transfers Sit to Stand;Stand to Sit    Sit to Stand 4: Min guard;With upper extremity assist;From bed;From chair/3-in-1    Sit to Stand Details (indicate cue type and reason) initial cues to scoot out to edge    Stand to Sit 4: Min guard;With upper extremity assist;To bed;To chair/3-in-1    Stand to Sit Details (indicate cue type and reason) Verbal cues for technique;Verbal cues for precautions/safety;Verbal cues for safe use of DME/AE    Stand to Sit Details cues to fully back BLE to mat before sitting    Comments performed TUG shuttle with no AD: standing up from chair, walking 10' and turning fully to sit down in chair (backing up and reaching back), performed x5 reps total, pt able to perform with incr reps without cues to fully back up to mat      Ambulation/Gait   Ambulation/Gait Yes    Ambulation/Gait  Assistance 4: Min guard;4: Min assist    Ambulation/Gait Assistance Details ambulated into clinic with RW, performed gait with no AD in session with cues for looking ahead and step length, pt needing primarily min guard, does need min A when performing turns    Ambulation Distance (Feet) 115 Feet   x2   Assistive device None    Gait Pattern Step-through pattern;Decreased arm swing - right;Decreased arm swing - left;Decreased stride length;Decreased dorsiflexion - right;Decreased dorsiflexion - left;Poor foot clearance - left;Poor foot clearance - right;Trunk flexed    Ambulation Surface Level;Indoor    Gait velocity 13.37 seconds = 2.45 ft/sec      Berg Balance Test   Sit to Stand Able to stand  independently using hands    Standing Unsupported Able to stand safely 2 minutes    Sitting with Back Unsupported but Feet Supported on Floor or Stool Able to sit safely and securely 2 minutes    Stand to Sit Sits safely with minimal use of hands    Transfers Able to transfer safely, definite need of hands    Standing Unsupported with Eyes Closed Able to stand 10 seconds safely    Standing Ubsupported with Feet Together Needs help to attain position but able to stand for 30 seconds with feet together    From Standing, Reach Forward with Outstretched Arm Can reach forward >12 cm safely (5")    From Standing Position, Pick up Object from Floor Able to pick up shoe, needs supervision    From Standing Position, Turn to Look Behind Over each Shoulder Looks behind from both sides and weight shifts well    Turn 360 Degrees Able to turn 360 degrees safely but slowly   15.75 to R, 11.16 to L   Standing Unsupported, Alternately Place Feet on Step/Stool Able to complete >2 steps/needs minimal assist    Standing Unsupported, One Foot in Front Able to take small step independently and hold 30 seconds    Standing on One Leg Tries to lift leg/unable to hold 3 seconds but remains standing independently    Total Score  39      Therapeutic Activites    Therapeutic Activities Other Therapeutic Activities    Other Therapeutic Activities discussed that due to score on BERG pt still needs to be using RW at all times for incr safety due to fall risk. going forwards will practice without AD to work towards pt ambulating small distances in the house with no AD.  discussed possibility of speech therapy to work on [employer], pt's spouse reports that his memory is the same since before the CVA, but is appreciative of the information and knows in the future that if things change then speech therapy can be an option                      PT Short Term Goals - 01/03/21 1348       PT SHORT TERM GOAL #1   Title Patient will improve Berg Balance to >/= 30/56 to demonstrate improved balance and reduced fall risk. ALL STGS DUE 12/21/20    Baseline 26/56- did not have time to assess on 12/27/20, 37/56 on 01/03/21    Time 4    Period Weeks    Status Achieved    Target Date 12/21/20      PT SHORT TERM GOAL #2   Title Pt and pt's spouse will be independent with initial HEP in order to build upon functional gains made in therapy.    Baseline pt and pt's spouse reports that he has not been performing consistently    Time 4    Period Weeks    Status Partially Met      PT SHORT TERM GOAL #3   Title Pt will ambulate at least 61' with appropriate AD and supervision over indoor leel surfaces in order to demo improved household mobility.    Baseline needs min guard with RW, able to walk proper distance    Time 4    Period Weeks    Status Partially Met      PT SHORT TERM GOAL #4   Title Pt will improve gait speed to at least 2.0 ft/sec with appropriate AD in order to demo decr fall risk.    Baseline 1.73 ft/sec; 18.62 seconds = 1.76 ft/sec    Time 4    Period Weeks    Status Not Met               PT Long Term Goals - 01/19/21 1519       PT LONG TERM GOAL #1   Title Patient will improve Berg Balance  to >/= 42/56 in order to demo decr fall risk. ALL LTGS DUE 01/18/21    Baseline 26/56; 37/56; on 01/03/21, 39/56 on 01/19/21    Time 8    Period Weeks    Status Not Met      PT LONG TERM GOAL #2   Title Pt will decr TUG time to 25 seconds or less with appropriate AD vs. no AD in order to demo decr fall risk.    Baseline 33.22 seconds with single HHA from therapist; 22.72 seconds on 01/17/21 with RW    Time 8    Period Weeks    Status Achieved      PT LONG TERM GOAL #3   Title Pt will ambulate at least 200' over outdoor unlevel surfaces with LRAD with supervision in order to demo improved community mobility.    Baseline did not have time to assess on 01/19/21    Time 8    Period Weeks    Status Deferred      PT LONG TERM GOAL #4   Title Pt will improve gait speed to at least 2.5 ft/sec with appropriate AD in order to demo improved community mobility    Baseline 1.73 ft/sec with HHA from therapist; 2.21 ft/sec on 01/17/21    Time 8    Period  Weeks    Status Not Met      PT LONG TERM GOAL #5   Title Pt will perform 5x sit <> stand in 18 seconds or less in order to demo decr fall risk and improved functional BLE strength.    Baseline 23.63 seconds with single UE support/none; 21.5 seconds on 01/17/21    Time 8    Period Weeks    Status Not Met      PT LONG TERM GOAL #6   Title Pt will improve FOTO score to at least a 72 in order to demo improved functional outcomes.    Baseline 58 - therapist unable to log onto FOTO account today, this goal will be ongoing    Time 8    Period Weeks    Status Deferred            Revised/ongoing LTGs for re-cert:     PT Long Term Goals - 01/19/21 1534       PT LONG TERM GOAL #1   Title Patient will improve Berg Balance to >/= 42/56 in order to demo decr fall risk. ALL LTGS DUE 02/16/21    Baseline 26/56; 37/56; on 01/03/21, 39/56 on 01/19/21    Time 4    Period Weeks    Status On-going    Target Date 02/16/21      PT LONG TERM GOAL #2    Title Pt will decr TUG time to 23 seconds or less with no AD in order to demo decr fall risk.    Baseline 28.06 seconds no AD    Time 4    Period Weeks    Status Revised      PT LONG TERM GOAL #3   Title Pt will ambulate at least 230' over indoor level surfaces with supervision in order to demo improved household mobility.    Time 4    Period Weeks    Status Revised      PT LONG TERM GOAL #4   Title Pt will improve gait speed to at least 2.7 ft/sec with appropriate AD vs. no AD in order to demo improved community mobility    Baseline 2.21 ft/sec on 01/17/21 with rollator, 2.45 ft/sec with no AD    Time 4    Period Weeks    Status Revised      PT LONG TERM GOAL #5   Title Pt will perform 5x sit <> stand in 18 seconds or less in order to demo decr fall risk and improved functional BLE strength.    Baseline 23.63 seconds with single UE support/none; 21.5 seconds on 01/17/21    Time 4    Period Weeks    Status On-going      PT LONG TERM GOAL #6   Title Pt will improve FOTO score to at least a 72 in order to demo improved functional outcomes.    Baseline 58 - therapist unable to log onto FOTO account today, this goal will be ongoing    Time 4    Period Weeks    Status On-going                 Plan - 01/19/21 1528     Clinical Impression Statement Checked remainder of pt's LTGs today. Pt did not meet LTG #1, improved BERG score to a 39/56 (previously 37/56 and at eval was 26/56), but did not improve it to goal level. Still putting pt at a significant risk for falls. Pt did  not meet LTGs #4 and #5 - improved sit to stands and gait speed, but not quite to goal level. Unable to check LTG #3 and #6 today due to time constraints and therapist not being able to log into FOTO. Ambulated with pt without AD today with pt needing min guard and instances of min A when turning for balance. Pt's gait speed with no AD was 2.45 ft/sec, indicating a limited community ambulator and TUG was 28.06  seconds with no AD. Pt's BP WFL throughout - will follow up with cardiologist next week. Will re-cert for an additional 2x week for 4 weeks to continue to address strengthening, gait, balance, transfers, and safety in order to demo decr fall risk and improved functional mobility/independence. LTGs updated/revised as appropriate.    Personal Factors and Comorbidities Comorbidity 3+;Past/Current Experience    Comorbidities a fib, CVA,HLD, HTN, CAD, CABG (2015)    Examination-Activity Limitations Bathing;Dressing;Locomotion Level;Reach Overhead;Squat;Transfers;Stairs;Stand    Examination-Participation Restrictions Community Activity;Occupation;Cleaning    Stability/Clinical Decision Making Evolving/Moderate complexity    Rehab Potential Good    PT Frequency 2x / week    PT Duration 4 weeks    PT Treatment/Interventions ADLs/Self Care Home Management;DME Instruction;Gait training;Stair training;Functional mobility training;Therapeutic exercise;Therapeutic activities;Balance training;Neuromuscular re-education;Patient/family education;Orthotic Fit/Training;Vestibular    PT Next Visit Plan monitor BP. look at HEP and update for balance. standing balance with narrow BOS, SLS. gait training with no AD.    Consulted and Agree with Plan of Care Patient;Family member/caregiver    Family Member Consulted pt's wife Edgar Thomas             Patient will benefit from skilled therapeutic intervention in order to improve the following deficits and impairments:  Abnormal gait, Decreased balance, Decreased activity tolerance, Decreased coordination, Decreased endurance, Decreased knowledge of use of DME, Decreased strength, Postural dysfunction  Visit Diagnosis: Other abnormalities of gait and mobility  Muscle weakness (generalized)  Unsteadiness on feet  Abnormal posture     Problem List Patient Active Problem List   Diagnosis Date Noted   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   Hematoma of right thigh  11/19/2018   Symptomatic anemia 11/08/2018   Pseudophakia 03/06/2018   S/P laser cataract surgery 03/06/2018   AMD (age-related macular degeneration), bilateral 02/26/2018   Combined form of senile cataract 02/17/2018   Herpes zoster with complication 96/78/9381   UTI (urinary tract infection) 02/18/2016   Acute on chronic combined systolic and diastolic congestive heart failure ([place]) 02/17/2016   CHF (congestive heart failure) ([place]) 02/17/2016   Gait disturbance 02/17/2016   Near syncope 09/03/2015   Hyponatremia 09/03/2015   Bronchitis 08/29/2015   Encounter for therapeutic drug monitoring 12/12/2013   Anticoagulated on warfarin 12/10/2013   Anticoagulated 12/10/2013   Cardiomyopathy, ischemic- EF 40-45% 12/09/2013   Atrial fibrillation with RVR ([place]) 12/05/2013   CAD- last PCI [facility] yrs ago at [facility] 12/05/2013   GERD (gastroesophageal reflux disease)    Hyperlipemia    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 [place], [place], [address] Phone: [phone]   Fax:  [phone]  Name: Edgar Thomas MRN: [id number] Date of Birth: January 24, 1930

## 2021-01-24 ENCOUNTER — Encounter: Payer: Self-pay | Admitting: Physical Therapy

## 2021-01-24 ENCOUNTER — Ambulatory Visit: Payer: Medicare Other | Admitting: Physical Therapy

## 2021-01-24 ENCOUNTER — Other Ambulatory Visit: Payer: Self-pay

## 2021-01-24 VITALS — BP 160/82

## 2021-01-24 DIAGNOSIS — R2689 Other abnormalities of gait and mobility: Secondary | ICD-10-CM | POA: Diagnosis not present

## 2021-01-24 DIAGNOSIS — R293 Abnormal posture: Secondary | ICD-10-CM

## 2021-01-24 DIAGNOSIS — M6281 Muscle weakness (generalized): Secondary | ICD-10-CM

## 2021-01-24 DIAGNOSIS — R2681 Unsteadiness on feet: Secondary | ICD-10-CM

## 2021-01-24 NOTE — Patient Instructions (Signed)
Access Code: [facility] URL: [email or web address] Date: 01/24/2021 Prepared by: Sherlie Ban  Exercises Sit to Stand with Armchair - 1 x daily - 5 x weekly - 2 sets - 10 reps Supine Bridge - 1 x daily - 5 x weekly - 1 sets - 10 reps - 2-3 seconds hold Seated Heel Toe Raises - 1 x daily - 5 x weekly - 2 sets - 10 reps Romberg Stance - 2 x daily - 5 x weekly - 3 sets - 30 hold Alternating Step Taps with Counter Support - 1 x daily - 5 x weekly - 2 sets - 10 reps Standing Single Leg Stance with Counter Support - 2 x daily - 5 x weekly - 3 sets - 10 hold

## 2021-01-24 NOTE — Therapy (Signed)
[facility] [address] [place], [place], [address] Phone: [phone]   Fax:  [phone]  Physical Therapy Treatment  Patient Details  Name: Edgar ChurchBilly Thomas MRN: [id number] Date of Birth: 09/30/1929 Referring Provider (PT): Dr. Anne HahnWillis   Encounter Date: 01/24/2021   PT End of Session - 01/24/21 1633     Visit Number 16    Number of Visits 23    Date for PT Re-Evaluation 02/18/21    Authorization Type BCBS Medicare    Progress Note Due on Visit 20    PT Start Time 1317    PT Stop Time 1359    PT Time Calculation (min) 42 min    Equipment Utilized During Treatment Gait belt    Activity Tolerance Patient tolerated treatment well    Behavior During Therapy WFL for tasks assessed/performed             Past Medical History:  Diagnosis Date   A-fib (HCC)    Anemia    CAD (coronary artery disease)    Constipation    GERD (gastroesophageal reflux disease)    Heart attack (HCC)    Heartburn    Hyperlipemia    Hypertension    Post herpetic neuralgia    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 LIMA to LAD, sequential SVG to OM 1 & OM 2, SVG to PD;  Surgeon: Delight OvensEdward B Gerhardt, MD;  [facility];  Service: Open Heart Surgery;  Laterality: N/A;   ENDOVEIN HARVEST OF GREATER SAPHENOUS VEIN Right 12/03/2013   Procedure: ENDOVEIN HARVEST OF GREATER SAPHENOUS VEIN;  Surgeon: Delight OvensEdward B Gerhardt, MD;  Location: [facility] OR;  Service: Open Heart Surgery;  Laterality: Right;    INTRAOPERATIVE TRANSESOPHAGEAL ECHOCARDIOGRAM N/A 12/03/2013   Procedure: INTRAOPERATIVE TRANSESOPHAGEAL ECHOCARDIOGRAM;  Surgeon: Delight OvensEdward B Gerhardt, MD;  [facility];  Service: Open Heart Surgery;  Laterality: N/A;    Vitals:   01/24/21 1323 01/24/21 1356  BP: (!) 160/85 (!) 160/82     Subjective Assessment - 01/24/21 1319     Subjective No falls. Sees the cardiologist on wednesday.    Patient is accompained by: Family member   spouse [name]   Pertinent History PMH: a fib, CVA,HLD, HTN, CAD, CABG (2015)    Limitations Walking;Standing;House hold activities    Patient Stated Goals wants to improve his balance and walking.    Currently in Pain? No/denies                               [facility] Adult PT Treatment/Exercise - 01/24/21 0001       Ambulation/Gait   Ambulation/Gait Yes    Ambulation/Gait Assistance 4: Min guard    Ambulation/Gait Assistance Details cues for posture and incr foot clearance with no AD    Ambulation Distance (Feet) 80 Feet   x2, plus additional distances   Assistive device None    Gait Pattern Step-through  pattern;Decreased arm swing - right;Decreased arm swing - left;Decreased stride length;Decreased dorsiflexion - right;Decreased dorsiflexion - left;Poor foot clearance - left;Poor foot clearance - right;Trunk flexed    Ambulation Surface Level;Indoor              Access Code: [email or web address] URL: [email or web address] Date: 01/24/2021 Prepared by: Sherlie Ban  Reviewed and updated HEP. See MedBridge for further details   Exercises Sit to Stand with Armchair - 1 x daily - 5 x weekly - 2 sets - 10 reps Supine Bridge - 1 x daily - 5 x weekly - 1 sets - 10 reps - 2-3 seconds hold Seated Heel Toe Raises - 1 x daily - 5 x weekly - 2 sets - 10 reps Romberg Stance - 2 x daily - 5 x weekly - 3 sets - 30 hold Alternating Step Taps with Counter Support - 1 x daily - 5 x weekly - 2 sets - 10 reps - opening up cabinets and  using bottom steps  Standing Single Leg Stance with Counter Support - 2 x daily - 5 x weekly - 3 sets - 10 hold - cues for posture and looking ahead.     Balance Exercises - 01/24/21 1353       Balance Exercises: Standing   Standing Eyes Opened Narrow base of support (BOS);Solid surface    Standing Eyes Opened Limitations feet together head turns x5 reps, head nods x5 reps, max verbal cues to perform head motions throughout    Partial Tandem Stance Eyes open;Intermittent upper extremity support;3 reps;Limitations   10-15 seconds bilat in // bars   Partial Tandem Stance Limitations pt needing verbal/demo cues to get feet in proper position               PT Education - 01/24/21 1632     Education Details pt sees cardiologist on wednesday - pt's spouse to bring in BP log that she has been keeping at home. PT also wrote down on sheet of paper for pt to bring in with recent  BP readings at therapy. updates to HEP for balance/strengthening    Person(s) Educated Patient;Spouse    Methods Explanation;Demonstration;Verbal cues;Handout    Comprehension Verbalized understanding;Returned demonstration              PT Short Term Goals - 01/19/21 1534       PT SHORT TERM GOAL #1   Title ALL STGS = LTGS               PT Long Term Goals - 01/19/21 1534       PT LONG TERM GOAL #1   Title Patient will improve Berg Balance to >/= 42/56 in order to demo decr fall risk. ALL LTGS DUE 02/16/21    Baseline 26/56; 37/56; on 01/03/21, 39/56 on 01/19/21    Time 4    Period Weeks    Status On-going    Target Date 02/16/21      PT LONG TERM GOAL #2   Title Pt will decr TUG time to 23 seconds or less with no AD in order to demo decr fall risk.    Baseline 28.06 seconds no AD    Time 4    Period Weeks    Status Revised      PT LONG TERM GOAL #3   Title Pt will ambulate at least 230' over indoor level surfaces with supervision in order to demo improved household mobility.    Time 4  Period Weeks    Status Revised      PT LONG TERM GOAL #4   Title Pt will improve gait speed to at least 2.7 ft/sec with appropriate AD vs. no AD in order to demo improved community mobility    Baseline 2.21 ft/sec on 01/17/21 with rollator, 2.45 ft/sec with no AD    Time 4    Period Weeks    Status Revised      PT LONG TERM GOAL #5   Title Pt will perform 5x sit <> stand in 18 seconds or less in order to demo decr fall risk and improved functional BLE strength.    Baseline 23.63 seconds with single UE support/none; 21.5 seconds on 01/17/21    Time 4    Period Weeks    Status On-going      PT LONG TERM GOAL #6   Title Pt will improve FOTO score to at least a 72 in order to demo improved functional outcomes.    Baseline 58 - therapist unable to log onto FOTO account today, this goal will be ongoing    Time 4    Period Weeks    Status On-going                   Plan - 01/24/21 1637     Clinical Impression Statement Today's skilled session focused on updating pt's HEP to include standing balance with narrow BOS, ankle strenthening, and SLS activities. Pt tolerated well - did need cues throughout for proper technique and for posture. Pt to perform at home with spouse for safety. Pt to see cardiologist this week. Ambulated throughout session with no AD and min guard. Will continue to progress towards LTGs.    Personal Factors and Comorbidities Comorbidity 3+;Past/Current Experience    Comorbidities a fib, CVA,HLD, HTN, CAD, CABG (2015)    Examination-Activity Limitations Bathing;Dressing;Locomotion Level;Reach Overhead;Squat;Transfers;Stairs;Stand    Examination-Participation Restrictions Community Activity;Occupation;Cleaning    Stability/Clinical Decision Making Evolving/Moderate complexity    Rehab Potential Good    PT Frequency 2x / week    PT Duration 4 weeks    PT Treatment/Interventions ADLs/Self Care Home Management;DME Instruction;Gait training;Stair training;Functional  mobility training;Therapeutic exercise;Therapeutic activities;Balance training;Neuromuscular re-education;Patient/family education;Orthotic Fit/Training;Vestibular    PT Next Visit Plan monitor BP. how was cardiologist? balance with narrow BOS, SLS. gait training with no AD. functional BLE strength    Consulted and Agree with Plan of Care Patient;Family member/caregiver    Family Member Consulted pt's wife [name]             Patient will benefit from skilled therapeutic intervention in order to improve the following deficits and impairments:  Abnormal gait, Decreased balance, Decreased activity tolerance, Decreased coordination, Decreased endurance, Decreased knowledge of use of DME, Decreased strength, Postural dysfunction  Visit Diagnosis: Other abnormalities of gait and mobility  Muscle weakness (generalized)  Abnormal posture  Unsteadiness on feet     Problem List Patient Active Problem List   Diagnosis Date Noted   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   Hematoma of right thigh 11/19/2018   Symptomatic anemia 11/08/2018   Pseudophakia 03/06/2018   S/P laser cataract surgery 03/06/2018   AMD (age-related macular degeneration), bilateral 02/26/2018   Combined form of senile cataract 02/17/2018   Herpes zoster with complication 04/09/2017   UTI (urinary tract infection) 02/18/2016   Acute on chronic combined systolic and diastolic congestive heart failure (HCC) 02/17/2016   CHF (congestive heart failure) (HCC) 02/17/2016   Gait disturbance 02/17/2016  Near syncope 09/03/2015   Hyponatremia 09/03/2015   Bronchitis 08/29/2015   Encounter for therapeutic drug monitoring 12/12/2013   Anticoagulated on warfarin 12/10/2013   Anticoagulated 12/10/2013   Cardiomyopathy, ischemic- EF 40-45% 12/09/2013   Atrial fibrillation with RVR (HCC) 12/05/2013   CAD- last PCI [facility] yrs ago at [facility] 12/05/2013   GERD (gastroesophageal reflux disease)    Hyperlipemia    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 [place], [place], [address] Phone: [phone]   Fax:  [phone]  Name: Edgar Thomas MRN: [id number] Date of Birth: 1929-08-07

## 2021-01-26 ENCOUNTER — Ambulatory Visit: Payer: Medicare Other | Admitting: Physical Therapy

## 2021-01-26 DIAGNOSIS — I5030 Unspecified diastolic (congestive) heart failure: Secondary | ICD-10-CM | POA: Diagnosis not present

## 2021-01-26 DIAGNOSIS — I480 Paroxysmal atrial fibrillation: Secondary | ICD-10-CM | POA: Diagnosis not present

## 2021-01-26 DIAGNOSIS — I2510 Atherosclerotic heart disease of native coronary artery without angina pectoris: Secondary | ICD-10-CM | POA: Diagnosis not present

## 2021-01-26 DIAGNOSIS — E7800 Pure hypercholesterolemia, unspecified: Secondary | ICD-10-CM | POA: Diagnosis not present

## 2021-02-02 ENCOUNTER — Ambulatory Visit: Payer: Medicare Other | Admitting: Physical Therapy

## 2021-02-03 ENCOUNTER — Ambulatory Visit: Payer: Medicare Other | Attending: Neurology

## 2021-02-03 ENCOUNTER — Other Ambulatory Visit: Payer: Self-pay

## 2021-02-03 VITALS — BP 144/95 | HR 72

## 2021-02-03 DIAGNOSIS — R2681 Unsteadiness on feet: Secondary | ICD-10-CM | POA: Diagnosis not present

## 2021-02-03 DIAGNOSIS — R293 Abnormal posture: Secondary | ICD-10-CM | POA: Insufficient documentation

## 2021-02-03 DIAGNOSIS — M6281 Muscle weakness (generalized): Secondary | ICD-10-CM | POA: Diagnosis not present

## 2021-02-03 DIAGNOSIS — R2689 Other abnormalities of gait and mobility: Secondary | ICD-10-CM | POA: Insufficient documentation

## 2021-02-03 NOTE — Therapy (Signed)
[facility] [address] [place], [place], [address] Phone: [phone]   Fax:  [phone]  Physical Therapy Treatment  Patient Details  Name: Edgar Thomas MRN: [id number] Date of Birth: 08/20/1929 Referring Provider (PT): Dr. Anne Thomas   Encounter Date: 02/03/2021   PT End of Session - 02/03/21 1534     Visit Number 17    Number of Visits 23    Date for PT Re-Evaluation 02/18/21    Authorization Type BCBS Medicare    Progress Note Due on Visit 20    PT Start Time 1531    PT Stop Time 1616    PT Time Calculation (min) 45 min    Equipment Utilized During Treatment Gait belt    Activity Tolerance Patient tolerated treatment well    Behavior During Therapy WFL for tasks assessed/performed             Past Medical History:  Diagnosis Date   A-fib (HCC)    Anemia    CAD (coronary artery disease)    Constipation    GERD (gastroesophageal reflux disease)    Heart attack (HCC)    Heartburn    Hyperlipemia    Hypertension    Post herpetic neuralgia    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 LIMA to LAD, sequential SVG to OM 1 & OM 2, SVG to PD;  Surgeon: Edgar Ovens, MD;  [facility] OR;  Service: Open Heart Surgery;  Laterality: N/A;   ENDOVEIN HARVEST OF GREATER SAPHENOUS VEIN Right 12/03/2013   Procedure: ENDOVEIN HARVEST OF GREATER SAPHENOUS VEIN;  Surgeon: Edgar Ovens, MD;  Location: [facility] OR;  Service: Open Heart Surgery;  Laterality: Right;    INTRAOPERATIVE TRANSESOPHAGEAL ECHOCARDIOGRAM N/A 12/03/2013   Procedure: INTRAOPERATIVE TRANSESOPHAGEAL ECHOCARDIOGRAM;  Surgeon: Edgar Ovens, MD;  Location[facility];  Service: Open Heart Surgery;  Laterality: N/A;    Vitals:   02/03/21 1536 02/03/21 1555  BP: (!) 156/88 (!) 144/95  Pulse: 69 72     Subjective Assessment - 02/03/21 1534     Subjective Patient saw cardiologist and they increased dosage on the lisonopril to 10 mg. No other changes. No falls. No pain.    Patient is accompained by: Family member   [name]   Pertinent History PMH: a fib, CVA,HLD, HTN, CAD, CABG (2015)    Limitations Walking;Standing;House hold activities    Patient Stated Goals wants to improve his balance and walking.    Currently in Pain? No/denies                 [facility] Adult PT Treatment/Exercise - 02/03/21 0001       Transfers   Transfers Sit to Stand;Stand to Sit    Sit to Stand 4: Min guard;With upper extremity assist;From bed;From chair/3-in-1    Stand to Sit 4: Min guard;With upper extremity assist;To bed;To chair/3-in-1    Stand to Sit Details (indicate cue type and reason) Verbal cues  for technique;Verbal cues for precautions/safety;Verbal cues for safe use of DME/AE    Stand to Sit Details cues to fully back up to surface and improved alignment prior to descent for improved safety.      Ambulation/Gait   Ambulation/Gait Yes    Ambulation/Gait Assistance 4: Min guard;4: Min assist    Ambulation/Gait Assistance Details Completed ambulation x 230 ft without AD working on improved step length bilaterally and control. Patietn demonstrating intemrittent increases in cadence with shortened step length. Reuqire intemrittent cues from PT. often pausing to restart.    Ambulation Distance (Feet) 230 Feet    Assistive device None    Gait Pattern Step-through pattern;Decreased arm swing - right;Decreased arm swing - left;Decreased stride length;Decreased dorsiflexion - right;Decreased  dorsiflexion - left;Poor foot clearance - left;Poor foot clearance - right;Trunk flexed    Ambulation Surface Level;Indoor      High Level Balance   High Level Balance Activities Negotitating around obstacles    High Level Balance Comments Completed ambulation without use of AD and CGA from PT, completed negotaition around obstalces including cones x 3 laps down and back, cues for improved turns and avoidance of bumping into cones. CGA. Vitals after ambulation: BP 174/94, HR: 78. Required extended seated rest break and returned to baseline.              Balance Exercises - 02/03/21 0001       Balance Exercises: Standing   SLS with Vectors Solid surface;Intermittent upper extremity assist;Upper extremity assist 2;Limitations    SLS with Vectors Limitations alternating toe taps to 6" step with BUE support initially, then worked toward reduced UE support progressing to single and then no UE support. increases cues and CGA required with no UE Support, completed x 15 reps bilaterally.    Step Ups Forward;6 inch;UE support 1;Limitations    Step Ups Limitations completed forward step ups with alternating LE in // bars with single UE support on R side, x 10 reps. Cues for upright posture and utilization of mirror for visual feedback.    Other Standing Exercises after balance activities, vitals: BP: 160/104, HR: 70. Required extended rest break for improvements.                PT Education - 02/03/21 1553     Education Details Continue to monitor BP as it is elevating with activity    Person(s) Educated Patient;Spouse    Methods Explanation    Comprehension Verbalized understanding              PT Short Term Goals - 01/19/21 1534       PT SHORT TERM GOAL #1   Title ALL STGS = LTGS               PT Long Term Goals - 01/19/21 1534       PT LONG TERM GOAL #1   Title Patient will improve Berg Balance to >/= 42/56 in order to demo decr fall risk. ALL LTGS DUE 02/16/21     Baseline 26/56; 37/56; on 01/03/21, 39/56 on 01/19/21    Time 4    Period Weeks    Status On-going    Target Date 02/16/21      PT LONG TERM GOAL #2   Title Pt will decr TUG time to 23 seconds or less with no AD in order to demo decr fall risk.    Baseline 28.06 seconds no AD    Time 4    Period Weeks  Status Revised      PT LONG TERM GOAL #3   Title Pt will ambulate at least 230' over indoor level surfaces with supervision in order to demo improved household mobility.    Time 4    Period Weeks    Status Revised      PT LONG TERM GOAL #4   Title Pt will improve gait speed to at least 2.7 ft/sec with appropriate AD vs. no AD in order to demo improved community mobility    Baseline 2.21 ft/sec on 01/17/21 with rollator, 2.45 ft/sec with no AD    Time 4    Period Weeks    Status Revised      PT LONG TERM GOAL #5   Title Pt will perform 5x sit <> stand in 18 seconds or less in order to demo decr fall risk and improved functional BLE strength.    Baseline 23.63 seconds with single UE support/none; 21.5 seconds on 01/17/21    Time 4    Period Weeks    Status On-going      PT LONG TERM GOAL #6   Title Pt will improve FOTO score to at least a 72 in order to demo improved functional outcomes.    Baseline 58 - therapist unable to log onto FOTO account today, this goal will be ongoing    Time 4    Period Weeks    Status On-going                   Plan - 02/03/21 1624     Clinical Impression Statement Continued gait training and balance working on negotiation around obstacles. Activites to promote SLS and improved BLE strengthening. Session limited today due to continued BP elevating with activities. BP returns to baseline with extended rest breaks. PT educating on continuance of monitoring BP, especially with activity at home. Spouse verbalize agreement. Will continue to progress toward all LTGs.    Personal Factors and Comorbidities Comorbidity 3+;Past/Current Experience     Comorbidities a fib, CVA,HLD, HTN, CAD, CABG (2015)    Examination-Activity Limitations Bathing;Dressing;Locomotion Level;Reach Overhead;Squat;Transfers;Stairs;Stand    Examination-Participation Restrictions Community Activity;Occupation;Cleaning    Stability/Clinical Decision Making Evolving/Moderate complexity    Rehab Potential Good    PT Frequency 2x / week    PT Duration 4 weeks    PT Treatment/Interventions ADLs/Self Care Home Management;DME Instruction;Gait training;Stair training;Functional mobility training;Therapeutic exercise;Therapeutic activities;Balance training;Neuromuscular re-education;Patient/family education;Orthotic Fit/Training;Vestibular    PT Next Visit Plan monitor BP. balance with narrow BOS, SLS. gait training with no AD. functional BLE strength    Consulted and Agree with Plan of Care Patient;Family member/caregiver    Family Member Consulted pt's wife Edgar Thomas             Patient will benefit from skilled therapeutic intervention in order to improve the following deficits and impairments:  Abnormal gait, Decreased balance, Decreased activity tolerance, Decreased coordination, Decreased endurance, Decreased knowledge of use of DME, Decreased strength, Postural dysfunction  Visit Diagnosis: Other abnormalities of gait and mobility  Muscle weakness (generalized)  Abnormal posture  Unsteadiness on feet     Problem List Patient Active Problem List   Diagnosis Date Noted   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   Hematoma of right thigh 11/19/2018   Symptomatic anemia 11/08/2018   Pseudophakia 03/06/2018   S/P laser cataract surgery 03/06/2018   AMD (age-related macular degeneration), bilateral 02/26/2018   Combined form of senile cataract 02/17/2018   Herpes zoster with complication 04/09/2017  UTI (urinary tract infection) 02/18/2016   Acute on chronic combined systolic and diastolic congestive heart failure (HCC) 02/17/2016   CHF (congestive heart  failure) (HCC) 02/17/2016   Gait disturbance 02/17/2016   Near syncope 09/03/2015   Hyponatremia 09/03/2015   Bronchitis 08/29/2015   Encounter for therapeutic drug monitoring 12/12/2013   Anticoagulated on warfarin 12/10/2013   Anticoagulated 12/10/2013   Cardiomyopathy, ischemic- EF 40-45% 12/09/2013   Atrial fibrillation with RVR (HCC) 12/05/2013   CAD- last PCI 19 yrs ago at [facility] 12/05/2013   GERD (gastroesophageal reflux disease)    Hyperlipemia    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 [place], [place], [address] Phone: [phone]   Fax:  [phone]  Name: Edgar Thomas MRN: [id number] Date of Birth: 1929-08-25

## 2021-02-07 ENCOUNTER — Other Ambulatory Visit: Payer: Self-pay

## 2021-02-07 ENCOUNTER — Ambulatory Visit: Payer: Medicare Other | Admitting: Physical Therapy

## 2021-02-07 VITALS — BP 180/86

## 2021-02-07 DIAGNOSIS — M6281 Muscle weakness (generalized): Secondary | ICD-10-CM | POA: Diagnosis not present

## 2021-02-07 DIAGNOSIS — I2510 Atherosclerotic heart disease of native coronary artery without angina pectoris: Secondary | ICD-10-CM | POA: Diagnosis not present

## 2021-02-07 DIAGNOSIS — R2689 Other abnormalities of gait and mobility: Secondary | ICD-10-CM | POA: Diagnosis not present

## 2021-02-07 DIAGNOSIS — J0100 Acute maxillary sinusitis, unspecified: Secondary | ICD-10-CM | POA: Diagnosis not present

## 2021-02-07 DIAGNOSIS — R293 Abnormal posture: Secondary | ICD-10-CM

## 2021-02-07 DIAGNOSIS — I10 Essential (primary) hypertension: Secondary | ICD-10-CM | POA: Diagnosis not present

## 2021-02-07 DIAGNOSIS — Z8673 Personal history of transient ischemic attack (TIA), and cerebral infarction without residual deficits: Secondary | ICD-10-CM | POA: Diagnosis not present

## 2021-02-07 DIAGNOSIS — R2681 Unsteadiness on feet: Secondary | ICD-10-CM | POA: Diagnosis not present

## 2021-02-07 NOTE — Therapy (Addendum)
[facility] [address] [place], [place], [address] Phone: [phone]   Fax:  [phone]  Physical Therapy Treatment  Patient Details  Name: Edgar ChurchBilly Thomas MRN: [id number] Date of Birth: 05/04/1930 Referring Provider (PT): Dr. Anne HahnWillis   Encounter Date: 02/07/2021   PT End of Session - 02/07/21 1417     Visit Number 18    Number of Visits 23    Date for PT Re-Evaluation 02/18/21    Authorization Type BCBS Medicare    Progress Note Due on Visit 20    PT Start Time 1321   pt late to session   PT Stop Time 1401    PT Time Calculation (min) 40 min    Equipment Utilized During Treatment Gait belt    Activity Tolerance Patient tolerated treatment well    Behavior During Therapy WFL for tasks assessed/performed             Past Medical History:  Diagnosis Date   A-fib (HCC)    Anemia    CAD (coronary artery disease)    Constipation    GERD (gastroesophageal reflux disease)    Heart attack (HCC)    Heartburn    Hyperlipemia    Hypertension    Post herpetic neuralgia    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 LIMA to LAD, sequential SVG to OM 1 & OM 2, SVG to PD;  Surgeon: Delight OvensEdward B Gerhardt, MD;  [facility];  Service: Open Heart Surgery;  Laterality: N/A;   ENDOVEIN HARVEST OF GREATER SAPHENOUS VEIN Right 12/03/2013   Procedure: ENDOVEIN HARVEST OF GREATER SAPHENOUS VEIN;  Surgeon: Delight OvensEdward B Gerhardt, MD;  Location: [facility] OR;  Service: Open Heart Surgery;   Laterality: Right;   INTRAOPERATIVE TRANSESOPHAGEAL ECHOCARDIOGRAM N/A 12/03/2013   Procedure: INTRAOPERATIVE TRANSESOPHAGEAL ECHOCARDIOGRAM;  Surgeon: Delight OvensEdward B Gerhardt, MD;  [facility];  Service: Open Heart Surgery;  Laterality: N/A;    Vitals:   02/07/21 1326 02/07/21 1347 02/07/21 1401  BP: (!) 178/82 (!) 180/86 (!) 180/86     Subjective Assessment - 02/07/21 1324     Subjective No falls, no changes.    Patient is accompained by: Family member   spouse [name]   Pertinent History PMH: a fib, CVA,HLD, HTN, CAD, CABG (2015)    Limitations Walking;Standing;House hold activities    Patient Stated Goals wants to improve his balance and walking.    Currently in Pain? No/denies                               [facility]/Exercise - 02/07/21 1418       Transfers   Transfers Sit to Stand;Stand to Sit    Stand to Sit 4: Min guard;With upper extremity assist;To bed;To chair/3-in-1    Stand to Sit Details (indicate cue type and reason) Verbal cues for technique;Verbal cues for precautions/safety;Verbal cues for safe use of DME/AE  Stand to Sit Details cues to fully back up to surface before sitting down    Comments performed x3 reps from elevated mat table with BUE support on blue air ex, needing min guard in standing for balance      Ambulation/Gait   Ambulation/Gait Yes    Ambulation/Gait Assistance 4: Min guard;4: Min assist    Ambulation/Gait Assistance Details small distances between activities with no AD, cues for step length and posture    Assistive device None    Gait Pattern Step-through pattern;Decreased arm swing - right;Decreased arm swing - left;Decreased stride length;Decreased dorsiflexion - right;Decreased dorsiflexion - left;Poor foot clearance - left;Poor foot clearance - right;Trunk flexed    Ambulation Surface Level;Indoor                 Balance Exercises - 02/07/21 1348       Balance Exercises: Standing   Standing Eyes  Opened Wide (BOA);Foam/compliant surface    Standing Eyes Opened Limitations standing on blue air ex: static standing with no UE support with feet apart 2 x 30 seconds - intermittent taps to wall for balance, head turns x10 reps with verbal cues for directions, alternating UE raises x5 reps bilat,  then with feet hip width > narrow BOS ball toss with PT tech x15 reps total    Standing Eyes Closed Limitations    Standing Eyes Closed Limitations on level ground with feet less than hip distance 2 x 30 seconds    Step Over Hurdles / Cones stepping over four 4" obstacles next to countertop, down and back x4 reps, using single UE support and min guard, needing cues for sequencing and foot clearance, performing with step to pattern alternating between lifting LLE and RLE                PT Education - 02/07/21 1416     Education Details continuing to monitor BP as it remains elevated after seeing cardiologist, pt to see his PCP next week    Person(s) Educated Patient;Spouse    Methods Explanation    Comprehension Verbalized understanding              PT Short Term Goals - 01/19/21 1534       PT SHORT TERM GOAL #1   Title ALL STGS = LTGS               PT Long Term Goals - 01/19/21 1534       PT LONG TERM GOAL #1   Title Patient will improve Berg Balance to >/= 42/56 in order to demo decr fall risk. ALL LTGS DUE 02/16/21    Baseline 26/56; 37/56; on 01/03/21, 39/56 on 01/19/21    Time 4    Period Weeks    Status On-going    Target Date 02/16/21      PT LONG TERM GOAL #2   Title Pt will decr TUG time to 23 seconds or less with no AD in order to demo decr fall risk.    Baseline 28.06 seconds no AD    Time 4    Period Weeks    Status Revised      PT LONG TERM GOAL #3   Title Pt will ambulate at least 230' over indoor level surfaces with supervision in order to demo improved household mobility.    Time 4    Period Weeks    Status Revised      PT LONG TERM GOAL #4    Title  Pt will improve gait speed to at least 2.7 ft/sec with appropriate AD vs. no AD in order to demo improved community mobility    Baseline 2.21 ft/sec on 01/17/21 with rollator, 2.45 ft/sec with no AD    Time 4    Period Weeks    Status Revised      PT LONG TERM GOAL #5   Title Pt will perform 5x sit <> stand in 18 seconds or less in order to demo decr fall risk and improved functional BLE strength.    Baseline 23.63 seconds with single UE support/none; 21.5 seconds on 01/17/21    Time 4    Period Weeks    Status On-going      PT LONG TERM GOAL #6   Title Pt will improve FOTO score to at least a 72 in order to demo improved functional outcomes.    Baseline 58 - therapist unable to log onto FOTO account today, this goal will be ongoing    Time 4    Period Weeks    Status On-going                   Plan - 02/07/21 1600     Clinical Impression Statement Pt continues with elevated BP (but WFL to participate in therapy) and pt needing intermittent seated rest breaks. Continued to educate on importance of monitoring BP as pt follows up with his PCP next week. Continued to work on balance strategies with SLS, narrow BOS, and unlevel surfaces. Will continue to progress towards LTGs.    Personal Factors and Comorbidities Comorbidity 3+;Past/Current Experience    Comorbidities a fib, CVA,HLD, HTN, CAD, CABG (2015)    Examination-Activity Limitations Bathing;Dressing;Locomotion Level;Reach Overhead;Squat;Transfers;Stairs;Stand    Examination-Participation Restrictions Community Activity;Occupation;Cleaning    Stability/Clinical Decision Making Evolving/Moderate complexity    Rehab Potential Good    PT Frequency 2x / week    PT Duration 4 weeks    PT Treatment/Interventions ADLs/Self Care Home Management;DME Instruction;Gait training;Stair training;Functional mobility training;Therapeutic exercise;Therapeutic activities;Balance training;Neuromuscular re-education;Patient/family  education;Orthotic Fit/Training;Vestibular    PT Next Visit Plan monitor BP (manual works best). balance with narrow BOS, SLS, compliant surfaces. gait training with no AD. functional BLE strength    Consulted and Agree with Plan of Care Patient;Family member/caregiver    Family Member Consulted pt's wife [name]             Patient will benefit from skilled therapeutic intervention in order to improve the following deficits and impairments:  Abnormal gait, Decreased balance, Decreased activity tolerance, Decreased coordination, Decreased endurance, Decreased knowledge of use of DME, Decreased strength, Postural dysfunction  Visit Diagnosis: Other abnormalities of gait and mobility  Muscle weakness (generalized)  Abnormal posture     Problem List Patient Active Problem List   Diagnosis Date Noted   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   Hematoma of right thigh 11/19/2018   Symptomatic anemia 11/08/2018   Pseudophakia 03/06/2018   S/P laser cataract surgery 03/06/2018   AMD (age-related macular degeneration), bilateral 02/26/2018   Combined form of senile cataract 02/17/2018   Herpes zoster with complication 04/09/2017   UTI (urinary tract infection) 02/18/2016   Acute on chronic combined systolic and diastolic congestive heart failure (HCC) 02/17/2016   CHF (congestive heart failure) (HCC) 02/17/2016   Gait disturbance 02/17/2016   Near syncope 09/03/2015   Hyponatremia 09/03/2015   Bronchitis 08/29/2015   Encounter for therapeutic drug monitoring 12/12/2013   Anticoagulated on warfarin 12/10/2013   Anticoagulated 12/10/2013  Cardiomyopathy, ischemic- EF 40-45% 12/09/2013   Atrial fibrillation with RVR (HCC) 12/05/2013   CAD- last PCI 19 yrs ago at [facility] 12/05/2013   GERD (gastroesophageal reflux disease)    Hyperlipemia    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

## 2021-02-10 ENCOUNTER — Ambulatory Visit: Payer: Medicare Other

## 2021-02-10 ENCOUNTER — Other Ambulatory Visit: Payer: Self-pay

## 2021-02-10 VITALS — BP 139/68 | HR 74

## 2021-02-10 DIAGNOSIS — M6281 Muscle weakness (generalized): Secondary | ICD-10-CM

## 2021-02-10 DIAGNOSIS — R2681 Unsteadiness on feet: Secondary | ICD-10-CM | POA: Diagnosis not present

## 2021-02-10 DIAGNOSIS — R2689 Other abnormalities of gait and mobility: Secondary | ICD-10-CM | POA: Diagnosis not present

## 2021-02-10 DIAGNOSIS — R293 Abnormal posture: Secondary | ICD-10-CM | POA: Diagnosis not present

## 2021-02-10 NOTE — Therapy (Signed)
[facility] [address] [place], [place], [address] Phone: [phone]   Fax:  [phone]  Physical Therapy Treatment  Patient Details  Name: Edgar Thomas MRN: [id number] Date of Birth: 1929/06/12 Referring Provider (PT): Dr. Anne Hahn   Encounter Date: 02/10/2021   PT End of Session - 02/10/21 1247     Visit Number 19    Number of Visits 23    Date for PT Re-Evaluation 02/18/21    Authorization Type BCBS Medicare    Progress Note Due on Visit 20    PT Start Time 1244   patient arriving late   PT Stop Time 1315    PT Time Calculation (min) 31 min    Equipment Utilized During Treatment Gait belt    Activity Tolerance Patient tolerated treatment well    Behavior During Therapy WFL for tasks assessed/performed             Past Medical History:  Diagnosis Date   A-fib (HCC)    Anemia    CAD (coronary artery disease)    Constipation    GERD (gastroesophageal reflux disease)    Heart attack (HCC)    Heartburn    Hyperlipemia    Hypertension    Post herpetic neuralgia    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 LIMA to LAD, sequential SVG to OM 1 & OM 2, SVG to PD;  Surgeon: Delight Ovens, MD;  Location: [facility] OR;  Service: Open Heart Surgery;  Laterality: N/A;   ENDOVEIN HARVEST OF GREATER SAPHENOUS VEIN Right 12/03/2013   Procedure: ENDOVEIN HARVEST OF GREATER SAPHENOUS VEIN;  Surgeon: Delight Ovens, MD;  Location: [facility] OR;  Service: Open Heart Surgery;   Laterality: Right;   INTRAOPERATIVE TRANSESOPHAGEAL ECHOCARDIOGRAM N/A 12/03/2013   Procedure: INTRAOPERATIVE TRANSESOPHAGEAL ECHOCARDIOGRAM;  Surgeon: Delight Ovens, MD;  Location: [facility] OR;  Service: Open Heart Surgery;  Laterality: N/A;    Vitals:   02/10/21 1248  BP: 139/68  Pulse: 74     Subjective Assessment - 02/10/21 1246     Subjective Patient reports no new change/complaints. Still plans to follow up with PCP regarding BP. No falls and no pain.    Patient is accompained by: Family member   spouse [name]   Pertinent History PMH: a fib, CVA,HLD, HTN, CAD, CABG (2015)    Limitations Walking;Standing;House hold activities    Patient Stated Goals wants to improve his balance and walking.    Currently in Pain? No/denies               [facility] Adult PT Treatment/Exercise - 02/10/21 0001       Transfers   Transfers Sit to Stand;Stand to Sit    Sit to Stand 4: Min guard;With upper extremity assist;From bed;From chair/3-in-1    Stand to Sit 4: Min guard;With upper extremity assist;To bed;To chair/3-in-1    Stand to Sit Details (indicate cue type and reason) Verbal cues for technique;Verbal cues for precautions/safety;Verbal  cues for safe use of DME/AE    Comments performed x 5 reps from with BUE support on blue air ex, needing min guard in standing for balance, Cues for improved forward lean "nose over toes"      Ambulation/Gait   Ambulation/Gait Yes    Ambulation/Gait Assistance 4: Min guard    Ambulation/Gait Assistance Details completed ambulation around therapy gym x 230 ft without AD, CGA from PT working on improved step length bilaterally. plus additional distances throughout therapy session without AD, with PT CGA. into/out of therapy session with walker.    Ambulation Distance (Feet) 230 Feet    Assistive device None    Gait Pattern Step-through pattern;Decreased arm swing - right;Decreased arm swing - left;Decreased stride length;Decreased dorsiflexion - right;Decreased  dorsiflexion - left;Poor foot clearance - left;Poor foot clearance - right;Trunk flexed    Ambulation Surface Level;Indoor      High Level Balance   High Level Balance Activities Backward walking;Marching forwards   BP: 137/71, HR: 78   High Level Balance Comments at countertop on blue mat: completed forward ambulation 10' followed by backwards walking 10' x 4 laps down and back. Then completed side stepping x 3 laps down and back, with cues for step length and technique. increased challenge with RLE trailing.      Neuro Re-ed    Neuro Re-ed Details  standing with single UE support at countertop: completed alternating forward/backward stepping over black foam beam x 10 reps each side. cues for increasesd step height and length to clear beam surface, increased challenge with RLE > LLE.                       PT Short Term Goals - 01/19/21 1534       PT SHORT TERM GOAL #1   Title ALL STGS = LTGS               PT Long Term Goals - 01/19/21 1534       PT LONG TERM GOAL #1   Title Patient will improve Berg Balance to >/= 42/56 in order to demo decr fall risk. ALL LTGS DUE 02/16/21    Baseline 26/56; 37/56; on 01/03/21, 39/56 on 01/19/21    Time 4    Period Weeks    Status On-going    Target Date 02/16/21      PT LONG TERM GOAL #2   Title Pt will decr TUG time to 23 seconds or less with no AD in order to demo decr fall risk.    Baseline 28.06 seconds no AD    Time 4    Period Weeks    Status Revised      PT LONG TERM GOAL #3   Title Pt will ambulate at least 230' over indoor level surfaces with supervision in order to demo improved household mobility.    Time 4    Period Weeks    Status Revised      PT LONG TERM GOAL #4   Title Pt will improve gait speed to at least 2.7 ft/sec with appropriate AD vs. no AD in order to demo improved community mobility    Baseline 2.21 ft/sec on 01/17/21 with rollator, 2.45 ft/sec with no AD    Time 4    Period Weeks    Status  Revised      PT LONG TERM GOAL #5   Title Pt will perform 5x sit <> stand in 18 seconds or less  in order to demo decr fall risk and improved functional BLE strength.    Baseline 23.63 seconds with single UE support/none; 21.5 seconds on 01/17/21    Time 4    Period Weeks    Status On-going      PT LONG TERM GOAL #6   Title Pt will improve FOTO score to at least a 72 in order to demo improved functional outcomes.    Baseline 58 - therapist unable to log onto FOTO account today, this goal will be ongoing    Time 4    Period Weeks    Status On-going                   Plan - 02/10/21 1337     Clinical Impression Statement Session limited due to patient arriving late. Continued gait training without AD and balance activities on complaint surfaces with intermittent UE support. Continued to require intermittent rest breaks. Vitals stable and responding to activity well today.    Personal Factors and Comorbidities Comorbidity 3+;Past/Current Experience    Comorbidities a fib, CVA,HLD, HTN, CAD, CABG (2015)    Examination-Activity Limitations Bathing;Dressing;Locomotion Level;Reach Overhead;Squat;Transfers;Stairs;Stand    Examination-Participation Restrictions Community Activity;Occupation;Cleaning    Stability/Clinical Decision Making Evolving/Moderate complexity    Rehab Potential Good    PT Frequency 2x / week    PT Duration 4 weeks    PT Treatment/Interventions ADLs/Self Care Home Management;DME Instruction;Gait training;Stair training;Functional mobility training;Therapeutic exercise;Therapeutic activities;Balance training;Neuromuscular re-education;Patient/family education;Orthotic Fit/Training;Vestibular    PT Next Visit Plan monitor BP (manual works best). balance with narrow BOS, SLS, compliant surfaces. gait training with no AD. functional BLE strength    Consulted and Agree with Plan of Care Patient;Family member/caregiver    Family Member Consulted pt's wife [name]              Patient will benefit from skilled therapeutic intervention in order to improve the following deficits and impairments:  Abnormal gait, Decreased balance, Decreased activity tolerance, Decreased coordination, Decreased endurance, Decreased knowledge of use of DME, Decreased strength, Postural dysfunction  Visit Diagnosis: Other abnormalities of gait and mobility  Muscle weakness (generalized)  Abnormal posture  Unsteadiness on feet     Problem List Patient Active Problem List   Diagnosis Date Noted   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   Hematoma of right thigh 11/19/2018   Symptomatic anemia 11/08/2018   Pseudophakia 03/06/2018   S/P laser cataract surgery 03/06/2018   AMD (age-related macular degeneration), bilateral 02/26/2018   Combined form of senile cataract 02/17/2018   Herpes zoster with complication 04/09/2017   UTI (urinary tract infection) 02/18/2016   Acute on chronic combined systolic and diastolic congestive heart failure (HCC) 02/17/2016   CHF (congestive heart failure) (HCC) 02/17/2016   Gait disturbance 02/17/2016   Near syncope 09/03/2015   Hyponatremia 09/03/2015   Bronchitis 08/29/2015   Encounter for therapeutic drug monitoring 12/12/2013   Anticoagulated on warfarin 12/10/2013   Anticoagulated 12/10/2013   Cardiomyopathy, ischemic- EF 40-45% 12/09/2013   Atrial fibrillation with RVR (HCC) 12/05/2013   CAD- last PCI 19 yrs ago at [facility] 12/05/2013   GERD (gastroesophageal reflux disease)    Hyperlipemia    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 [place], [place], [address] Phone: [phone]   Fax:  [phone]  Name: Edgar Thomas MRN: [id number] Date of Birth: 1930-05-24

## 2021-02-14 ENCOUNTER — Ambulatory Visit: Payer: Medicare Other

## 2021-02-14 ENCOUNTER — Other Ambulatory Visit: Payer: Self-pay

## 2021-02-14 VITALS — BP 172/104 | HR 67

## 2021-02-14 DIAGNOSIS — R2689 Other abnormalities of gait and mobility: Secondary | ICD-10-CM

## 2021-02-14 DIAGNOSIS — M6281 Muscle weakness (generalized): Secondary | ICD-10-CM

## 2021-02-14 DIAGNOSIS — R293 Abnormal posture: Secondary | ICD-10-CM

## 2021-02-14 DIAGNOSIS — R2681 Unsteadiness on feet: Secondary | ICD-10-CM

## 2021-02-14 NOTE — Therapy (Signed)
[facility] [address] [place], [place], [address] Phone: [phone]   Fax:  [phone]  Physical Therapy Treatment/Progress Note  Patient Details  Name: Edgar Thomas MRN: [id number] Date of Birth: [birth year] Referring Provider (PT): Dr. Jannifer Franklin  Physical Therapy Progress Note   Dates of Reporting Period: 01/05/21 - 02/14/21  See Note below for Objective Data and Assessment of Progress/Goals.  Thank you for the referral of this patient. Guillermina [place], PT, DPT  Encounter Date: 02/14/2021   PT End of Session - 02/14/21 1321     Visit Number 20    Number of Visits 23    Date for PT Re-Evaluation 02/18/21    Authorization Type BCBS Medicare    Progress Note Due on Visit 72    PT Start Time 1321   patient late   PT Stop Time 1400    PT Time Calculation (min) 39 min    Equipment Utilized During Treatment Gait belt    Activity Tolerance Patient tolerated treatment well    Behavior During Therapy WFL for tasks assessed/performed             Past Medical History:  Diagnosis Date   A-fib ([place])    Anemia    CAD (coronary artery disease)    Constipation    GERD (gastroesophageal reflux disease)    Heart attack ([place])    Heartburn    Hyperlipemia    Hypertension    Post herpetic neuralgia    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 LIMA to LAD, sequential SVG to OM 1 & OM 2, SVG to PD;  Surgeon: Grace Isaac, MD;  Location: [facility];  Service: Open Heart  Surgery;  Laterality: N/A;   ENDOVEIN HARVEST OF GREATER SAPHENOUS VEIN Right 12/03/2013   Procedure: ENDOVEIN HARVEST OF GREATER SAPHENOUS VEIN;  Surgeon: Grace Isaac, MD;  Location: [facility];  Service: Open Heart Surgery;  Laterality: Right;   INTRAOPERATIVE TRANSESOPHAGEAL ECHOCARDIOGRAM N/A 12/03/2013   Procedure: INTRAOPERATIVE TRANSESOPHAGEAL ECHOCARDIOGRAM;  Surgeon: Grace Isaac, MD;  Location: [facility];  Service: Open Heart Surgery;  Laterality: N/A;    Vitals:   02/14/21 1327 02/14/21 1329 02/14/21 1354  BP: (!) 166/101 (!) 173/98 (!) 172/104  Pulse: 67 67      Subjective Assessment - 02/14/21 1323     Subjective No new changes or complaints. No falls. Is planning to see PCP visit tomorrow.    Patient is accompained by: Family member   spouse [name]   Pertinent History PMH: a fib, CVA,HLD, HTN, CAD, CABG (2015)    Limitations Walking;Standing;House hold activities    Patient Stated Goals wants to improve his balance and walking.    Currently in Pain? No/denies                [facility] PT Assessment - 02/14/21 0001       Observation/Other Assessments   Focus on Therapeutic Outcomes (FOTO)  60%  Stroke LE     Standardized Balance Assessment   Standardized Balance Assessment Berg Balance Test      Berg Balance Test   Sit to Stand Able to stand  independently using hands    Standing Unsupported Able to stand safely 2 minutes    Sitting with Back Unsupported but Feet Supported on Floor or Stool Able to sit safely and securely 2 minutes    Stand to Sit Controls descent by using hands    Transfers Able to transfer with verbal cueing and /or supervision    Standing Unsupported with Eyes Closed Able to stand 10 seconds safely    Standing Unsupported with Feet Together Able to place feet together independently and stand for 1 minute with supervision    From Standing, Reach Forward with Outstretched Arm Can reach forward >12 cm safely (5")   6"   From Standing Position, Pick  up Object from [place] to pick up shoe safely and easily    From Standing Position, Turn to Look Behind Over each Shoulder Turn sideways only but maintains balance    Turn 360 Degrees Able to turn 360 degrees safely but slowly    Standing Unsupported, Alternately Place Feet on Step/Stool Able to complete 4 steps without aid or supervision    Standing Unsupported, One Foot in Front Able to take small step independently and hold 30 seconds    Standing on One Leg Able to lift leg independently and hold equal to or more than 3 seconds    Total Score 40    Berg comment: 40/56              [facility] Adult PT Treatment/Exercise - 02/14/21 0001       Transfers   Transfers Sit to Stand;Stand to Sit    Sit to Stand 5: Supervision    Five time sit to stand comments  21.1 seconds with BUE support from chair    Stand to Sit 5: Supervision    Comments completed x 2 trials, require verbal cues from PT/      Ambulation/Gait   Ambulation/Gait Yes    Ambulation/Gait Assistance 4: Min guard    Ambulation/Gait Assistance Details ambulation into/out of therapy session with RW    Assistive device Rolling walker    Gait Pattern Step-through pattern;Decreased arm swing - right;Decreased arm swing - left;Decreased stride length;Decreased dorsiflexion - right;Decreased dorsiflexion - left;Poor foot clearance - left;Poor foot clearance - right;Trunk flexed    Ambulation Surface Level;Indoor                     PT Education - 02/14/21 1328     Education Details progress toward LTGs; monitor BP    Person(s) Educated Patient;Spouse    Methods Explanation    Comprehension Verbalized understanding              PT Short Term Goals - 01/19/21 1534       PT SHORT TERM GOAL #1   Title ALL STGS = LTGS               PT Long Term Goals - 02/14/21 1324       PT LONG TERM GOAL #1   Title Patient will improve Berg Balance to >/= 42/56 in order to demo decr fall risk. ALL LTGS DUE  02/16/21    Baseline 26/56; 37/56; on 01/03/21, 39/56 on 01/19/21; 40/56    Time 4    Period Weeks  Status Not Met      PT LONG TERM GOAL #2   Title Pt will decr TUG time to 23 seconds or less with no AD in order to demo decr fall risk.    Baseline 28.06 seconds no AD    Time 4    Period Weeks    Status Revised      PT LONG TERM GOAL #3   Title Pt will ambulate at least 230' over indoor level surfaces with supervision in order to demo improved household mobility.    Time 4    Period Weeks    Status Revised      PT LONG TERM GOAL #4   Title Pt will improve gait speed to at least 2.7 ft/sec with appropriate AD vs. no AD in order to demo improved community mobility    Baseline 2.21 ft/sec on 01/17/21 with rollator, 2.45 ft/sec with no AD    Time 4    Period Weeks    Status Revised      PT LONG TERM GOAL #5   Title Pt will perform 5x sit <> stand in 18 seconds or less in order to demo decr fall risk and improved functional BLE strength.    Baseline 23.63 seconds with single UE support/none; 21.5 seconds on 01/17/21; 21.1 seconds with BUE support    Time 4    Period Weeks    Status Not Met      PT LONG TERM GOAL #6   Title Pt will improve FOTO score to at least a 72 in order to demo improved functional outcomes.    Baseline 58 - therapist unable to log onto FOTO account today, this goal will be ongoing; 60% for Stroke LE    Time 4    Period Weeks    Status Not Met                   Plan - 02/14/21 1443     Clinical Impression Statement Began assesment of patient's progress toward LTGs. Patient making minimal progress toward LTGs. Patient improved Berg Balance and 5x sit <> stand but not to STG/LTG goal level. Session limited due to continued elevated BP readings require frequent rest breaks and reassesment to physical activity. Will finish assesment of LTG next visit with expected d/c.    Personal Factors and Comorbidities Comorbidity 3+;Past/Current Experience     Comorbidities a fib, CVA,HLD, HTN, CAD, CABG (2015)    Examination-Activity Limitations Bathing;Dressing;Locomotion Level;Reach Overhead;Squat;Transfers;Stairs;Stand    Examination-Participation Restrictions Community Activity;Occupation;Cleaning    Stability/Clinical Decision Making Evolving/Moderate complexity    Rehab Potential Good    PT Frequency 2x / week    PT Duration 4 weeks    PT Treatment/Interventions ADLs/Self Care Home Management;DME Instruction;Gait training;Stair training;Functional mobility training;Therapeutic exercise;Therapeutic activities;Balance training;Neuromuscular re-education;Patient/family education;Orthotic Fit/Training;Vestibular    PT Next Visit Plan monitor BP (manual works best). finish checking LTG + D/C    Consulted and Agree with Plan of Care Patient;Family member/caregiver    Family Member Consulted pt's wife [name]             Patient will benefit from skilled therapeutic intervention in order to improve the following deficits and impairments:  Abnormal gait, Decreased balance, Decreased activity tolerance, Decreased coordination, Decreased endurance, Decreased knowledge of use of DME, Decreased strength, Postural dysfunction  Visit Diagnosis: Other abnormalities of gait and mobility  Muscle weakness (generalized)  Abnormal posture  Unsteadiness on feet     Problem List Patient Active Problem  List   Diagnosis Date Noted   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   Hematoma of right thigh 11/19/2018   Symptomatic anemia 11/08/2018   Pseudophakia 03/06/2018   S/P laser cataract surgery 03/06/2018   AMD (age-related macular degeneration), bilateral 02/26/2018   Combined form of senile cataract 02/17/2018   Herpes zoster with complication 86/48/4720   UTI (urinary tract infection) 02/18/2016   Acute on chronic combined systolic and diastolic congestive heart failure ([place]) 02/17/2016   CHF (congestive heart failure) ([place]) 02/17/2016   Gait  disturbance 02/17/2016   Near syncope 09/03/2015   Hyponatremia 09/03/2015   Bronchitis 08/29/2015   Encounter for therapeutic drug monitoring 12/12/2013   Anticoagulated on warfarin 12/10/2013   Anticoagulated 12/10/2013   Cardiomyopathy, ischemic- EF 40-45% 12/09/2013   Atrial fibrillation with RVR ([place]) 12/05/2013   CAD- last PCI [facility] yrs ago at [facility] 12/05/2013   GERD (gastroesophageal reflux disease)    Hyperlipemia    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

## 2021-02-15 DIAGNOSIS — Z8673 Personal history of transient ischemic attack (TIA), and cerebral infarction without residual deficits: Secondary | ICD-10-CM | POA: Diagnosis not present

## 2021-02-15 DIAGNOSIS — R600 Localized edema: Secondary | ICD-10-CM | POA: Diagnosis not present

## 2021-02-15 DIAGNOSIS — I2510 Atherosclerotic heart disease of native coronary artery without angina pectoris: Secondary | ICD-10-CM | POA: Diagnosis not present

## 2021-02-15 DIAGNOSIS — I10 Essential (primary) hypertension: Secondary | ICD-10-CM | POA: Diagnosis not present

## 2021-02-15 DIAGNOSIS — B0229 Other postherpetic nervous system involvement: Secondary | ICD-10-CM | POA: Diagnosis not present

## 2021-02-16 ENCOUNTER — Ambulatory Visit: Payer: Medicare Other

## 2021-02-16 ENCOUNTER — Other Ambulatory Visit: Payer: Self-pay

## 2021-02-16 VITALS — BP 159/87 | HR 69

## 2021-02-16 DIAGNOSIS — R2681 Unsteadiness on feet: Secondary | ICD-10-CM | POA: Diagnosis not present

## 2021-02-16 DIAGNOSIS — R293 Abnormal posture: Secondary | ICD-10-CM | POA: Diagnosis not present

## 2021-02-16 DIAGNOSIS — M6281 Muscle weakness (generalized): Secondary | ICD-10-CM

## 2021-02-16 DIAGNOSIS — R2689 Other abnormalities of gait and mobility: Secondary | ICD-10-CM | POA: Diagnosis not present

## 2021-02-16 NOTE — Therapy (Signed)
[facility] [address] [place], [place], [address] Phone: [phone]   Fax:  [phone]  Physical Therapy Treatment/Discharge Summary  Patient Details  Name: Edgar Thomas MRN: [id number] Date of Birth: Nov 23, 1929 Referring Provider (PT): Dr. Jannifer Franklin  PHYSICAL THERAPY DISCHARGE SUMMARY  Visits from Start of Care: 21  Current functional level related to goals / functional outcomes: See Clinical Impression Statement   Remaining deficits: Imbalance, Abnormal Gait   Education / Equipment: HEP provided   Patient agrees to discharge. Patient goals were partially met. Patient is being discharged due to being pleased with the current functional level.  Encounter Date: 02/16/2021   PT End of Session - 02/16/21 1330     Visit Number 21    Number of Visits 23    Date for PT Re-Evaluation 02/18/21    Authorization Type BCBS Medicare    Progress Note Due on Visit 32    PT Start Time 1328    PT Stop Time 1352    PT Time Calculation (min) 24 min    Equipment Utilized During Treatment Gait belt    Activity Tolerance Patient tolerated treatment well    Behavior During Therapy WFL for tasks assessed/performed             Past Medical History:  Diagnosis Date   A-fib ([place])    Anemia    CAD (coronary artery disease)    Constipation    GERD (gastroesophageal reflux disease)    Heart attack ([place])    Heartburn    Hyperlipemia    Hypertension    Post herpetic neuralgia    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 LIMA to LAD, sequential SVG to OM 1 & OM 2, SVG to PD;  Surgeon: Grace Isaac, MD;  Location: [facility];  Service: Open Heart Surgery;  Laterality: N/A;   ENDOVEIN HARVEST OF GREATER SAPHENOUS VEIN Right 12/03/2013   Procedure: ENDOVEIN HARVEST OF GREATER SAPHENOUS VEIN;  Surgeon: Grace Isaac, MD;  Location: [facility];  Service: Open Heart Surgery;  Laterality: Right;   INTRAOPERATIVE TRANSESOPHAGEAL ECHOCARDIOGRAM N/A 12/03/2013   Procedure: INTRAOPERATIVE TRANSESOPHAGEAL ECHOCARDIOGRAM;  Surgeon: Grace Isaac, MD;  Location: [facility];  Service: Open Heart Surgery;  Laterality: N/A;    Vitals:   02/16/21 1332  BP: (!) 159/87  Pulse: 69     Subjective Assessment - 02/16/21 1329     Subjective No new changes/complaints. No pain or falls. Reports visit with PCP went well.    Patient is accompained by: Family member   spouse [name]   Pertinent History PMH: a fib, CVA,HLD, HTN, CAD, CABG (2015)    Limitations Walking;Standing;House hold activities    Patient Stated Goals wants to improve his balance and walking.    Currently in Pain? No/denies                [facility] PT Assessment - 02/16/21 0001       Assessment  Medical Diagnosis CVA/gait abnormality    Referring Provider (PT) Dr. Thane Edu Adult PT Treatment/Exercise - 02/16/21 0001       Ambulation/Gait   Ambulation/Gait Yes    Ambulation/Gait Assistance 4: Min guard    Ambulation/Gait Assistance Details ambulation x 230 ft with RW indoors, supervision level.    Ambulation Distance (Feet) 230 Feet    Assistive device Rolling walker    Gait Pattern Step-through pattern;Decreased arm swing - right;Decreased arm swing - left;Decreased stride length;Decreased dorsiflexion - right;Decreased dorsiflexion - left;Poor foot clearance - left;Poor foot clearance - right;Trunk flexed    Ambulation Surface Level;Indoor    Gait velocity 13.2 seconds = 2.48  ft/sec      Standardized Balance Assessment   Standardized Balance Assessment Timed Up and Go Test      Timed Up and Go Test   TUG Normal TUG    Normal TUG (seconds) 21.72            Reviewed HEP. Continued education focused on safety and reduced fall risk.   Access Code: [facility] URL: [email or web address] Date: 01/24/2021 Prepared by: Janann August   Exercises Sit to Stand with Armchair - 1 x daily - 5 x weekly - 2 sets - 10 reps Supine Bridge - 1 x daily - 5 x weekly - 1 sets - 10 reps - 2-3 seconds hold Seated Heel Toe Raises - 1 x daily - 5 x weekly - 2 sets - 10 reps Romberg Stance - 2 x daily - 5 x weekly - 3 sets - 30 hold Alternating Step Taps with Counter Support - 1 x daily - 5 x weekly - 2 sets - 10 reps Standing Single Leg Stance with Counter Support - 2 x daily - 5 x weekly - 3 sets - 10 hold    PT Education - 02/16/21 1446     Education Details LTG; HEP compliance; Continue fall risk and safety information    Person(s) Educated Patient;Spouse    Methods Explanation    Comprehension Verbalized understanding              PT Short Term Goals - 01/19/21 1534       PT SHORT TERM GOAL #1   Title ALL STGS = LTGS               PT Long Term Goals - 02/16/21 1338       PT LONG TERM GOAL #1   Title Patient will improve Berg Balance to >/= 42/56 in order to demo decr fall risk. ALL LTGS DUE 02/16/21    Baseline 26/56; 37/56; on 01/03/21, 39/56 on 01/19/21; 40/56    Time 4    Period Weeks    Status Not Met      PT LONG TERM GOAL #2   Title Pt will decr TUG time to 23 seconds or less with no AD in order to demo decr fall risk.    Baseline 28.06 seconds no AD; 21.72 secs no AD    Time 4    Period Weeks    Status Achieved      PT LONG TERM GOAL #3   Title Pt will ambulate at least 230' over indoor level surfaces with supervision in order to demo improved household mobility.    Baseline 230' with supervisoin with RW indoors    Time  4    Period Weeks    Status Achieved  PT LONG TERM GOAL #4   Title Pt will improve gait speed to at least 2.7 ft/sec with appropriate AD vs. no AD in order to demo improved community mobility    Baseline 2.21 ft/sec on 01/17/21 with rollator, 2.45 ft/sec with no AD; 2.48 ft/secon with rollator    Time 4    Period Weeks    Status Not Met      PT LONG TERM GOAL #5   Title Pt will perform 5x sit <> stand in 18 seconds or less in order to demo decr fall risk and improved functional BLE strength.    Baseline 23.63 seconds with single UE support/none; 21.5 seconds on 01/17/21; 21.1 seconds with BUE support    Time 4    Period Weeks    Status Not Met      PT LONG TERM GOAL #6   Title Pt will improve FOTO score to at least a 72 in order to demo improved functional outcomes.    Baseline 58 - therapist unable to log onto FOTO account today, this goal will be ongoing; 60% for Stroke LE    Time 4    Period Weeks    Status Not Met                   Plan - 02/16/21 1359     Clinical Impression Statement Finished assessment of patient's progress toward all LTGs. Patient able to meet LTG #2-4, patient able to make progress toward all other LTGs but not to goal level at this time. Patient improved TUG to 21.72 secs, gait speed to 2.48 ft/sec with RW. Patient and spouse comfortable with current functional level. Reviewed HEP and contineud education on importance of compliance. Patient and spouse agreeable to d/c, with PT verbalizing agreement. Patient made slow steady progress with PT services but still at fall risk at this time. Therefore PT continuing to educate on safety.    Personal Factors and Comorbidities Comorbidity 3+;Past/Current Experience    Comorbidities a fib, CVA,HLD, HTN, CAD, CABG (2015)    Examination-Activity Limitations Bathing;Dressing;Locomotion Level;Reach Overhead;Squat;Transfers;Stairs;Stand    Examination-Participation Restrictions Community  Activity;Occupation;Cleaning    Stability/Clinical Decision Making Evolving/Moderate complexity    Rehab Potential Good    PT Frequency 2x / week    PT Duration 4 weeks    PT Treatment/Interventions ADLs/Self Care Home Management;DME Instruction;Gait training;Stair training;Functional mobility training;Therapeutic exercise;Therapeutic activities;Balance training;Neuromuscular re-education;Patient/family education;Orthotic Fit/Training;Vestibular    PT Next Visit Plan --    Consulted and Agree with Plan of Care Patient;Family member/caregiver    Family Member Consulted pt's wife [name]             Patient will benefit from skilled therapeutic intervention in order to improve the following deficits and impairments:  Abnormal gait, Decreased balance, Decreased activity tolerance, Decreased coordination, Decreased endurance, Decreased knowledge of use of DME, Decreased strength, Postural dysfunction  Visit Diagnosis: Other abnormalities of gait and mobility  Muscle weakness (generalized)  Abnormal posture  Unsteadiness on feet     Problem List Patient Active Problem List   Diagnosis Date Noted   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   Hematoma of right thigh 11/19/2018   Symptomatic anemia 11/08/2018   Pseudophakia 03/06/2018   S/P laser cataract surgery 03/06/2018   AMD (age-related macular degeneration), bilateral 02/26/2018   Combined form of senile cataract 02/17/2018   Herpes zoster with complication 73/41/9379   UTI (urinary tract infection) 02/18/2016   Acute on chronic combined systolic and diastolic congestive heart  failure ([place]) 02/17/2016   CHF (congestive heart failure) ([place]) 02/17/2016   Gait disturbance 02/17/2016   Near syncope 09/03/2015   Hyponatremia 09/03/2015   Bronchitis 08/29/2015   Encounter for therapeutic drug monitoring 12/12/2013   Anticoagulated on warfarin 12/10/2013   Anticoagulated 12/10/2013   Cardiomyopathy, ischemic- EF 40-45% 12/09/2013    Atrial fibrillation with RVR ([place]) 12/05/2013   CAD- last PCI 19 yrs ago at [facility] 12/05/2013   GERD (gastroesophageal reflux disease)    Hyperlipemia    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 [place], [place], [address] Phone: [phone]   Fax:  [phone]  Name: Edgar Thomas MRN: [id number] Date of Birth: 1929-11-27

## 2021-02-17 ENCOUNTER — Ambulatory Visit: Payer: Medicare Other

## 2021-03-09 DIAGNOSIS — D649 Anemia, unspecified: Secondary | ICD-10-CM | POA: Diagnosis not present

## 2021-03-09 DIAGNOSIS — R600 Localized edema: Secondary | ICD-10-CM | POA: Diagnosis not present

## 2021-03-09 DIAGNOSIS — I4891 Unspecified atrial fibrillation: Secondary | ICD-10-CM | POA: Diagnosis not present

## 2021-03-09 DIAGNOSIS — B0229 Other postherpetic nervous system involvement: Secondary | ICD-10-CM | POA: Diagnosis not present

## 2021-03-09 DIAGNOSIS — I2510 Atherosclerotic heart disease of native coronary artery without angina pectoris: Secondary | ICD-10-CM | POA: Diagnosis not present

## 2021-04-18 DIAGNOSIS — K5909 Other constipation: Secondary | ICD-10-CM | POA: Diagnosis not present

## 2021-04-18 DIAGNOSIS — B0229 Other postherpetic nervous system involvement: Secondary | ICD-10-CM | POA: Diagnosis not present

## 2021-04-18 DIAGNOSIS — R600 Localized edema: Secondary | ICD-10-CM | POA: Diagnosis not present

## 2021-04-18 DIAGNOSIS — D649 Anemia, unspecified: Secondary | ICD-10-CM | POA: Diagnosis not present

## 2021-04-18 DIAGNOSIS — I2510 Atherosclerotic heart disease of native coronary artery without angina pectoris: Secondary | ICD-10-CM | POA: Diagnosis not present

## 2021-04-18 DIAGNOSIS — E782 Mixed hyperlipidemia: Secondary | ICD-10-CM | POA: Diagnosis not present

## 2021-04-24 ENCOUNTER — Encounter (HOSPITAL_BASED_OUTPATIENT_CLINIC_OR_DEPARTMENT_OTHER): Payer: Self-pay

## 2021-04-24 ENCOUNTER — Emergency Department (HOSPITAL_BASED_OUTPATIENT_CLINIC_OR_DEPARTMENT_OTHER): Payer: Medicare Other

## 2021-04-24 ENCOUNTER — Emergency Department (HOSPITAL_BASED_OUTPATIENT_CLINIC_OR_DEPARTMENT_OTHER)
Admission: EM | Admit: 2021-04-24 | Discharge: 2021-04-24 | Disposition: A | Discharge status: Home / Self Care | Payer: Medicare Other | Attending: Emergency Medicine | Admitting: Emergency Medicine

## 2021-04-24 DIAGNOSIS — Z79899 Other long term (current) drug therapy: Secondary | ICD-10-CM | POA: Insufficient documentation

## 2021-04-24 DIAGNOSIS — N400 Enlarged prostate without lower urinary tract symptoms: Secondary | ICD-10-CM | POA: Diagnosis not present

## 2021-04-24 DIAGNOSIS — I2510 Atherosclerotic heart disease of native coronary artery without angina pectoris: Secondary | ICD-10-CM | POA: Diagnosis not present

## 2021-04-24 DIAGNOSIS — Z951 Presence of aortocoronary bypass graft: Secondary | ICD-10-CM | POA: Insufficient documentation

## 2021-04-24 DIAGNOSIS — N200 Calculus of kidney: Secondary | ICD-10-CM | POA: Diagnosis not present

## 2021-04-24 DIAGNOSIS — I5043 Acute on chronic combined systolic (congestive) and diastolic (congestive) heart failure: Secondary | ICD-10-CM | POA: Diagnosis not present

## 2021-04-24 DIAGNOSIS — N210 Calculus in bladder: Secondary | ICD-10-CM | POA: Diagnosis not present

## 2021-04-24 DIAGNOSIS — R410 Disorientation, unspecified: Secondary | ICD-10-CM | POA: Insufficient documentation

## 2021-04-24 DIAGNOSIS — I110 Hypertensive heart disease with heart failure: Secondary | ICD-10-CM | POA: Insufficient documentation

## 2021-04-24 DIAGNOSIS — R319 Hematuria, unspecified: Secondary | ICD-10-CM | POA: Diagnosis present

## 2021-04-24 DIAGNOSIS — R310 Gross hematuria: Secondary | ICD-10-CM | POA: Diagnosis not present

## 2021-04-24 DIAGNOSIS — Z7901 Long term (current) use of anticoagulants: Secondary | ICD-10-CM | POA: Insufficient documentation

## 2021-04-24 DIAGNOSIS — K5900 Constipation, unspecified: Secondary | ICD-10-CM | POA: Diagnosis not present

## 2021-04-24 DIAGNOSIS — G9389 Other specified disorders of brain: Secondary | ICD-10-CM | POA: Diagnosis not present

## 2021-04-24 LAB — URINALYSIS, ROUTINE W REFLEX MICROSCOPIC
Bilirubin Urine: NEGATIVE
Glucose, UA: NEGATIVE mg/dL
Ketones, ur: NEGATIVE mg/dL
Leukocytes,Ua: NEGATIVE
Nitrite: NEGATIVE
Protein, ur: 30 mg/dL — AB
RBC / HPF: >50 RBC/hpf — ABNORMAL HIGH (ref 0–5)
Specific Gravity, Urine: 1.014 (ref 1.005–1.030)
pH: 7.5 (ref 5.0–8.0)

## 2021-04-24 LAB — CBC WITH DIFFERENTIAL/PLATELET
Abs Immature Granulocytes: 0.01 10*3/uL (ref 0.00–0.07)
Basophils Absolute: 0 10*3/uL (ref 0.0–0.1)
Basophils Relative: 1 %
Eosinophils Absolute: 0.3 10*3/uL (ref 0.0–0.5)
Eosinophils Relative: 4 %
HCT: 45 % (ref 39.0–52.0)
Hemoglobin: 15.2 g/dL (ref 13.0–17.0)
Immature Granulocytes: 0 %
Lymphocytes Relative: 34 %
Lymphs Abs: 2.8 10*3/uL (ref 0.7–4.0)
MCH: 32.4 pg (ref 26.0–34.0)
MCHC: 33.8 g/dL (ref 30.0–36.0)
MCV: 95.9 fL (ref 80.0–100.0)
Monocytes Absolute: 0.7 10*3/uL (ref 0.1–1.0)
Monocytes Relative: 8 %
Neutro Abs: 4.3 10*3/uL (ref 1.7–7.7)
Neutrophils Relative %: 53 %
Platelets: 131 10*3/uL — ABNORMAL LOW (ref 150–400)
RBC: 4.69 MIL/uL (ref 4.22–5.81)
RDW: 13.2 % (ref 11.5–15.5)
WBC: 8.1 10*3/uL (ref 4.0–10.5)
nRBC: 0 % (ref 0.0–0.2)

## 2021-04-24 LAB — COMPREHENSIVE METABOLIC PANEL
ALT: 13 U/L (ref 0–44)
AST: 26 U/L (ref 15–41)
Albumin: 4.1 g/dL (ref 3.5–5.0)
Alkaline Phosphatase: 63 U/L (ref 38–126)
Anion gap: 9 (ref 5–15)
BUN: 26 mg/dL — ABNORMAL HIGH (ref 8–23)
CO2: 26 mmol/L (ref 22–32)
Calcium: 9.9 mg/dL (ref 8.9–10.3)
Chloride: 105 mmol/L (ref 98–111)
Creatinine, Ser: 0.77 mg/dL (ref 0.61–1.24)
GFR, Estimated: >60 mL/min (ref >60)
Glucose, Bld: 127 mg/dL — ABNORMAL HIGH (ref 70–99)
Potassium: 4.3 mmol/L (ref 3.5–5.1)
Sodium: 140 mmol/L (ref 135–145)
Total Bilirubin: 0.9 mg/dL (ref 0.3–1.2)
Total Protein: 7.4 g/dL (ref 6.5–8.1)

## 2021-04-24 MED ORDER — SODIUM CHLORIDE 0.9 % IV BOLUS
1000.0000 mL | Freq: Once | INTRAVENOUS | Status: AC
  Administered 2021-04-24: 10:00:00 1000 mL via INTRAVENOUS

## 2021-04-24 NOTE — ED Notes (Signed)
Patient transported to CT 

## 2021-04-24 NOTE — Discharge Instructions (Signed)
Please follow-up with the urologist in the office.  Please return for worsening symptoms.  Fever chills worsening confusion.

## 2021-04-24 NOTE — ED Notes (Signed)
Bladder Scanned Pt. Pt's Wife stated he used the Bathroom in the Waiting Room. Pt stated he did feel like he emptied his bladder when he went. 0 mL noted on Bladder Scanner x2.

## 2021-04-24 NOTE — ED Provider Notes (Signed)
[facility] EMERGENCY DEPT Provider Note   CSN: [id number] Arrival date & time: 04/24/21  [id number]     History Chief Complaint  Patient presents with   Hematuria    Edgar Thomas is a 87 y.o. male.  87 yo M with a chief complaints of hematuria.  Going on for about 48 hours.  The patient's wife also feels like he is a little bit off.  Tells me that he is not really been eating or drinking as much and has been speaking a bit less than normal.  Denies any abdominal discomfort denies cough or congestion denies fevers or chills denies dysuria increased frequency or hesitancy.  Denies difficulty emptying his bladder.  Has chronic problems with constipation.  Not particularly worse now than normal.  Recently stopped taking gabapentin as it was thought that maybe that was making him have worsening issues with his balance which is been an ongoing issue since he had a stroke in May.  The history is provided by the patient and the spouse.  Hematuria Pertinent negatives include no chest pain, no abdominal pain, no headaches and no shortness of breath.      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   Hematoma of right thigh 11/19/2018   Symptomatic anemia 11/08/2018   Pseudophakia 03/06/2018   S/P laser cataract surgery 03/06/2018   AMD (age-related macular degeneration), bilateral 02/26/2018   Combined form of senile cataract 02/17/2018   Herpes zoster with complication [id number]   UTI (urinary tract infection) 02/18/2016    Acute on chronic combined systolic and diastolic congestive heart failure ([place]) 02/17/2016   CHF (congestive heart failure) ([place]) 02/17/2016   Gait disturbance 02/17/2016   Near syncope 09/03/2015   Hyponatremia 09/03/2015   Bronchitis 08/29/2015   Encounter for therapeutic drug monitoring 12/12/2013   Anticoagulated on warfarin 12/10/2013   Anticoagulated 12/10/2013   Cardiomyopathy, ischemic- EF 40-45% 12/09/2013   Atrial fibrillation with RVR ([place]) 12/05/2013   CAD- last PCI [facility] yrs ago at [facility] 12/05/2013   GERD (gastroesophageal reflux disease)    Hyperlipemia    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 LIMA to LAD, sequential SVG to OM 1 & OM 2, SVG to PD;  Surgeon: Grace Isaac, MD;  Location: [facility];  Service: Open Heart Surgery;  Laterality: N/A;   ENDOVEIN HARVEST OF GREATER SAPHENOUS VEIN Right 12/03/2013   Procedure: ENDOVEIN HARVEST OF GREATER SAPHENOUS VEIN;  Surgeon: Grace Isaac, MD;  Location: [facility];  Service: Open Heart  Surgery;  Laterality: Right;   INTRAOPERATIVE TRANSESOPHAGEAL ECHOCARDIOGRAM N/A 12/03/2013   Procedure: INTRAOPERATIVE TRANSESOPHAGEAL ECHOCARDIOGRAM;  Surgeon: Delight Ovens, MD;  Location: [facility] OR;  Service: Open Heart Surgery;  Laterality: N/A;       Family History  Problem Relation Age of Onset   Heart disease Mother        died at age 37 of heart attack   Leukemia Sister    Heart disease Brother    Hypertension Brother     Social History   Tobacco Use   Smoking status: Never   Smokeless tobacco: Never  Substance Use Topics   Alcohol use: No   Drug use: No    Home  Medications Prior to Admission medications   Medication Sig Start Date End Date Taking? Authorizing Provider  apixaban (ELIQUIS) 5 MG TABS tablet Take 5 mg by mouth 2 (two) times daily.   Yes [provider]  benzonatate (TESSALON) 100 MG capsule Take 1 capsule (100 mg total) by mouth 3 (three) times daily as needed for cough. 08/30/15   Rai, Ripudeep Kirtland Bouchard, MD  Cholecalciferol (VITAMIN D-3) 25 MCG (1000 UT) CAPS Take 1,000 Units by mouth daily after breakfast.    [provider]  CRESTOR 20 MG tablet Take 20 mg by mouth every morning.  08/26/14   [provider]  dofetilide (TIKOSYN) 125 MCG capsule Take 125 mcg by mouth 2 (two) times daily. 05/20/18   [provider]  ferrous sulfate 325 (65 FE) MG tablet Take 325 mg by mouth daily with breakfast.    [provider]  gabapentin (NEURONTIN) 100 MG capsule Take 100 mg by mouth 2 (two) times a day. Morning and bedtime 04/19/18   [provider]  guaiFENesin (MUCINEX) 600 MG 12 hr tablet Take 2 tablets (1,200 mg total) by mouth 2 (two) times daily. 08/30/15   Rai, Ripudeep K, MD  lisinopril (ZESTRIL) 5 MG tablet Take 5 mg by mouth daily.    [provider]  metoprolol tartrate (LOPRESSOR) 25 MG tablet Take 0.5 tablets (12.5 mg total) by mouth 2 (two) times daily. 11/12/18   Mirian Mo, MD  Multiple Vitamins-Minerals (ONE-A-DAY MENS 50+ ADVANTAGE) TABS Take 1 tablet by mouth daily with breakfast.     [provider]  multivitamin-lutein (OCUVITE-LUTEIN) CAPS capsule Take 1 capsule by mouth daily with breakfast.     [provider]  nitroGLYCERIN (NITROSTAT) 0.4 MG SL tablet Place 0.4 mg under the tongue every 5 (five) minutes as needed for chest pain.    [provider]  NON FORMULARY Place under the tongue See admin instructions. CBD oil: Place 2-3 droppersful sublingually 2 times a day    [provider]  Omega 3 1000 MG CAPS Take 1,000 mg by mouth daily with  breakfast.     [provider]  polyethylene glycol (MIRALAX / GLYCOLAX) packet Take 17 g by mouth daily as needed for moderate constipation. 08/30/15   Rai, Delene Ruffini, MD    Allergies    Contrast media [iodinated diagnostic agents], Metrizamide, Other, Amiodarone, Ciprofloxacin, and Tramadol  Review of Systems   Review of Systems  Constitutional:  Positive for activity change. Negative for chills and fever.  HENT:  Negative for congestion and facial swelling.   Eyes:  Negative for discharge and visual disturbance.  Respiratory:  Negative for shortness of breath.   Cardiovascular:  Negative for chest pain and palpitations.  Gastrointestinal:  Negative for abdominal pain, diarrhea and vomiting.  Genitourinary:  Positive for hematuria.  Musculoskeletal:  Negative for arthralgias and myalgias.  Skin:  Negative for color change and rash.  Neurological:  Negative for tremors, syncope and headaches.  Psychiatric/Behavioral:  Negative for confusion and dysphoric mood.    Physical Exam Updated Vital Signs BP (!) 193/129   Pulse 70   Temp 98.6 F (37 C) (Oral)   Resp 16   SpO2 96%   Physical Exam Vitals and nursing note reviewed.  Constitutional:      Appearance: He is well-developed.  HENT:     Head: Normocephalic and atraumatic.  Eyes:     Pupils: Pupils are equal, round, and reactive to light.  Neck:     Vascular: No JVD.  Cardiovascular:     Rate and Rhythm: Normal rate and regular rhythm.     Heart sounds: No murmur heard.   No friction rub. No gallop.  Pulmonary:     Effort: No respiratory distress.     Breath sounds: No wheezing.  Abdominal:     General: There is no distension.     Tenderness: There is no abdominal tenderness. There is no guarding or rebound.  Musculoskeletal:        General: Normal range of motion.     Cervical back: Normal range of motion and neck supple.  Skin:    Coloration: Skin is not pale.     Findings: No rash.  Neurological:      Mental Status: He is alert and oriented to person, place, and time.  Psychiatric:        Behavior: Behavior normal.    ED Results / Procedures / Treatments   Labs (all labs ordered are listed, but only abnormal results are displayed) Labs Reviewed  CBC WITH DIFFERENTIAL/PLATELET - Abnormal; Notable for the following components:      Result Value   Platelets 131 (*)    All other components within normal limits  COMPREHENSIVE METABOLIC PANEL - Abnormal; Notable for the following components:   Glucose, Bld 127 (*)    BUN 26 (*)    All other components within normal limits  URINALYSIS, ROUTINE W REFLEX MICROSCOPIC - Abnormal; Notable for the following components:   APPearance CLOUDY (*)    Hgb urine dipstick LARGE (*)    Protein, ur 30 (*)    RBC / HPF >50 (*)    Bacteria, UA RARE (*)    All other components within normal limits  URINE CULTURE    EKG None  Radiology CT Head Wo Contrast  Result Date: 04/24/2021 CLINICAL DATA:  Delirium.  History of "mini stroke" in May. EXAM: CT HEAD WITHOUT CONTRAST TECHNIQUE: Contiguous axial images were obtained from the base of the skull through the vertex without intravenous contrast. COMPARISON:  CT head 11/09/2018 FINDINGS: Brain: No evidence of acute infarction, hemorrhage, hydrocephalus, extra-axial collection or mass lesion/mass effect. Chronic infarcts in the right internal capsule and left thalamus. Hypodensities in the periventricular and subcortical white matter, consistent with chronic small-vessel ischemic changes. Generalized parenchymal volume loss with associated enlargement of the ventricles. Vascular: No hyperdense vessel. Vascular calcifications at the skull base, unchanged compared to 11/09/2018 Skull: Normal. Negative for fracture or focal lesion. Sinuses/Orbits: No acute finding. Mild mucosal thickening in the left frontal sinus and a few scattered bilateral ethmoid air cells. Trace mucosal thickening also noted in the right  frontal sinus. Other visualized paranasal sinuses and mastoid air cells are clear. Other: None. IMPRESSION: No acute intracranial abnormality. Stable exam with  generalized parenchymal volume loss and chronic infarcts in the right internal capsule and left thalamus. Electronically Signed   By: Ileana Roup M.D.   On: 04/24/2021 09:52   CT Renal Stone Study  Result Date: 04/24/2021 CLINICAL DATA:  Hematuria for 2 days of unknown cause EXAM: CT ABDOMEN AND PELVIS WITHOUT CONTRAST TECHNIQUE: Multidetector CT imaging of the abdomen and pelvis was performed following the standard protocol without IV contrast. COMPARISON:  CT pelvis 11/08/2018 FINDINGS: Lower chest: Accentuation of bibasilar interstitial markings and minimal bibasilar atelectasis Hepatobiliary: Calcified granulomata and cysts within liver. Gallbladder unremarkable. No additional hepatic masses. Pancreas: Atrophic pancreas without mass Spleen: Normal appearance.  Small adjacent splenule Adrenals/Urinary Tract: Adrenal glands normal appearance. Small calcifications at LEFT kidney which could be renovascular or tiny calculi. No renal mass, hydronephrosis or hydroureter. No ureteral calculus. Large bladder calculus identified on RIGHT at/adjacent to ureterovesical junction, 13 x 11 mm. Stomach/Bowel: Increased stool in rectum. Prominent stool in proximal half of colon. Stomach decompressed. Remaining bowel loops unremarkable. Vascular/Lymphatic: Pelvic phleboliths. Extensive atherosclerotic calcifications aorta, iliac arteries, visceral arteries, coronary arteries. Enlargement of cardiac chambers. No adenopathy. Reproductive: Mild prostatic enlargement, gland measuring 5.3 x 3.5 x 3.9 cm (volume = 38 cm^3) Other: No free air or free fluid. Question small BILATERAL inguinal hernias containing fat. Musculoskeletal: Bones demineralized IMPRESSION: Large bladder calculus at/adjacent to RIGHT ureterovesical junction measuring 13 x 11 mm without hydronephrosis  or hydroureter. Small calcifications at LEFT kidney which could be renovascular or tiny nonobstructing calculi. Mild prostatic enlargement. Increased stool in rectum and proximal half of colon. Question small BILATERAL inguinal hernias containing fat. Aortic Atherosclerosis (ICD10-I70.0). Electronically Signed   By: Lavonia Dana M.D.   On: 04/24/2021 12:09    Procedures Procedures   Medications Ordered in ED Medications  sodium chloride 0.9 % bolus 1,000 mL (1,000 mLs Intravenous New Bag/Given 04/24/21 1025)    ED Course  I have reviewed the triage vital signs and the nursing notes.  Pertinent labs & imaging results that were available during my care of the patient were reviewed by me and considered in my medical decision making (see chart for details).    MDM Rules/Calculators/A&P                           87 yo M with a chief complaints of hematuria and decreased activity and decreased oral intake over the past couple days.  Unfortunately the patient urinated in triage and we are unable to obtain the urine.  Will await for the urine sample.  Bolus of IV fluids blood work CT of the head.  So far the work-up has been largely unremarkable, UA with blood but not obviously infected.  Sent off for culture.  No significant leukocytosis no significant electrolyte abnormality no anemia.  I discussed the results with the family and was discussing discharge plan they added a component to the history that the patient actually fell prior to the onset of hematuria.  Again he denies any flank pain pain in the chest pain in the abdomen but with a history of possibly traumatic hematuria we will obtain a CT scan.  We will not be able to perform a contrasted study due to a dye allergy.  CT with a stone in the bladder.  I am not sure if this would cause him to have hematuria, and fairly large and so may have been in there for some time.  We will have him  follow-up with urology.  12:24 PM:  I have discussed  the diagnosis/risks/treatment options with the patient and family and believe the pt to be eligible for discharge home to follow-up with Urology. We also discussed returning to the ED immediately if new or worsening sx occur. We discussed the sx which are most concerning (e.g., sudden worsening pain, fever, inability to tolerate by mouth) that necessitate immediate return. Medications administered to the patient during their visit and any new prescriptions provided to the patient are listed below.  Medications given during this visit Medications  sodium chloride 0.9 % bolus 1,000 mL (1,000 mLs Intravenous New Bag/Given 04/24/21 1025)     The patient appears reasonably screen and/or stabilized for discharge and I doubt any other medical condition or other [facility] requiring further screening, evaluation, or treatment in the ED at this time prior to discharge.   Final Clinical Impression(s) / ED Diagnoses Final diagnoses:  Bladder stone  Gross hematuria    Rx / DC Orders ED Discharge Orders     None        Deno Etienne, DO 04/24/21 1224

## 2021-04-24 NOTE — ED Notes (Signed)
Dc instructions reviewed with patient. Patient voiced understanding. Dc with belongings.  °

## 2021-04-24 NOTE — ED Triage Notes (Signed)
His wife is with him and tells me that pt. Has had some hematuria x 2 days. She states pt. "Had a mini stroke in May". She further tells me that pt. "Is a little off". Pt. Is awake, alert and ambulatory with assist.

## 2021-04-25 DIAGNOSIS — R8271 Bacteriuria: Secondary | ICD-10-CM | POA: Diagnosis not present

## 2021-04-25 DIAGNOSIS — R310 Gross hematuria: Secondary | ICD-10-CM | POA: Diagnosis not present

## 2021-04-25 DIAGNOSIS — N210 Calculus in bladder: Secondary | ICD-10-CM | POA: Diagnosis not present

## 2021-04-25 LAB — URINE CULTURE: Culture: <10000 — AB

## 2021-04-26 ENCOUNTER — Other Ambulatory Visit: Payer: Self-pay | Admitting: Urology

## 2021-04-27 DIAGNOSIS — R310 Gross hematuria: Secondary | ICD-10-CM | POA: Diagnosis not present

## 2021-05-02 ENCOUNTER — Emergency Department (HOSPITAL_BASED_OUTPATIENT_CLINIC_OR_DEPARTMENT_OTHER)
Admission: EM | Admit: 2021-05-02 | Discharge: 2021-05-02 | Disposition: A | Discharge status: Home / Self Care | Payer: Medicare Other | Attending: Emergency Medicine | Admitting: Emergency Medicine

## 2021-05-02 ENCOUNTER — Emergency Department (HOSPITAL_BASED_OUTPATIENT_CLINIC_OR_DEPARTMENT_OTHER): Payer: Medicare Other

## 2021-05-02 ENCOUNTER — Encounter (HOSPITAL_BASED_OUTPATIENT_CLINIC_OR_DEPARTMENT_OTHER): Payer: Self-pay

## 2021-05-02 ENCOUNTER — Other Ambulatory Visit: Payer: Self-pay

## 2021-05-02 DIAGNOSIS — R000 Tachycardia, unspecified: Secondary | ICD-10-CM | POA: Diagnosis not present

## 2021-05-02 DIAGNOSIS — I5043 Acute on chronic combined systolic (congestive) and diastolic (congestive) heart failure: Secondary | ICD-10-CM | POA: Diagnosis not present

## 2021-05-02 DIAGNOSIS — I110 Hypertensive heart disease with heart failure: Secondary | ICD-10-CM | POA: Insufficient documentation

## 2021-05-02 DIAGNOSIS — Z7901 Long term (current) use of anticoagulants: Secondary | ICD-10-CM | POA: Insufficient documentation

## 2021-05-02 DIAGNOSIS — W19XXXA Unspecified fall, initial encounter: Secondary | ICD-10-CM

## 2021-05-02 DIAGNOSIS — I2510 Atherosclerotic heart disease of native coronary artery without angina pectoris: Secondary | ICD-10-CM | POA: Insufficient documentation

## 2021-05-02 DIAGNOSIS — Z951 Presence of aortocoronary bypass graft: Secondary | ICD-10-CM | POA: Diagnosis not present

## 2021-05-02 DIAGNOSIS — Z043 Encounter for examination and observation following other accident: Secondary | ICD-10-CM | POA: Diagnosis not present

## 2021-05-02 DIAGNOSIS — M545 Low back pain, unspecified: Secondary | ICD-10-CM | POA: Diagnosis present

## 2021-05-02 DIAGNOSIS — R531 Weakness: Secondary | ICD-10-CM | POA: Diagnosis not present

## 2021-05-02 DIAGNOSIS — Z79899 Other long term (current) drug therapy: Secondary | ICD-10-CM | POA: Insufficient documentation

## 2021-05-02 DIAGNOSIS — N210 Calculus in bladder: Secondary | ICD-10-CM | POA: Insufficient documentation

## 2021-05-02 DIAGNOSIS — K5900 Constipation, unspecified: Secondary | ICD-10-CM | POA: Diagnosis not present

## 2021-05-02 DIAGNOSIS — R319 Hematuria, unspecified: Secondary | ICD-10-CM | POA: Insufficient documentation

## 2021-05-02 DIAGNOSIS — M47816 Spondylosis without myelopathy or radiculopathy, lumbar region: Secondary | ICD-10-CM | POA: Diagnosis not present

## 2021-05-02 DIAGNOSIS — R9431 Abnormal electrocardiogram [ECG] [EKG]: Secondary | ICD-10-CM | POA: Diagnosis not present

## 2021-05-02 LAB — CBC WITH DIFFERENTIAL/PLATELET
Abs Immature Granulocytes: 0.03 10*3/uL (ref 0.00–0.07)
Basophils Absolute: 0 10*3/uL (ref 0.0–0.1)
Basophils Relative: 0 %
Eosinophils Absolute: 0 10*3/uL (ref 0.0–0.5)
Eosinophils Relative: 0 %
HCT: 44.5 % (ref 39.0–52.0)
Hemoglobin: 15 g/dL (ref 13.0–17.0)
Immature Granulocytes: 0 %
Lymphocytes Relative: 16 %
Lymphs Abs: 1.7 10*3/uL (ref 0.7–4.0)
MCH: 32.5 pg (ref 26.0–34.0)
MCHC: 33.7 g/dL (ref 30.0–36.0)
MCV: 96.5 fL (ref 80.0–100.0)
Monocytes Absolute: 1.1 10*3/uL — ABNORMAL HIGH (ref 0.1–1.0)
Monocytes Relative: 10 %
Neutro Abs: 8.1 10*3/uL — ABNORMAL HIGH (ref 1.7–7.7)
Neutrophils Relative %: 74 %
Platelets: 176 10*3/uL (ref 150–400)
RBC: 4.61 MIL/uL (ref 4.22–5.81)
RDW: 13.3 % (ref 11.5–15.5)
WBC: 11 10*3/uL — ABNORMAL HIGH (ref 4.0–10.5)
nRBC: 0 % (ref 0.0–0.2)

## 2021-05-02 LAB — COMPREHENSIVE METABOLIC PANEL
ALT: 11 U/L (ref 0–44)
AST: 17 U/L (ref 15–41)
Albumin: 4.3 g/dL (ref 3.5–5.0)
Alkaline Phosphatase: 87 U/L (ref 38–126)
Anion gap: 9 (ref 5–15)
BUN: 20 mg/dL (ref 8–23)
CO2: 28 mmol/L (ref 22–32)
Calcium: 10.3 mg/dL (ref 8.9–10.3)
Chloride: 102 mmol/L (ref 98–111)
Creatinine, Ser: 0.78 mg/dL (ref 0.61–1.24)
GFR, Estimated: >60 mL/min (ref >60)
Glucose, Bld: 117 mg/dL — ABNORMAL HIGH (ref 70–99)
Potassium: 4 mmol/L (ref 3.5–5.1)
Sodium: 139 mmol/L (ref 135–145)
Total Bilirubin: 1.4 mg/dL — ABNORMAL HIGH (ref 0.3–1.2)
Total Protein: 7.7 g/dL (ref 6.5–8.1)

## 2021-05-02 LAB — TROPONIN I (HIGH SENSITIVITY)
Troponin I (High Sensitivity): 30 ng/L — ABNORMAL HIGH (ref <18)
Troponin I (High Sensitivity): 31 ng/L — ABNORMAL HIGH (ref <18)

## 2021-05-02 LAB — MAGNESIUM: Magnesium: 2.1 mg/dL (ref 1.7–2.4)

## 2021-05-02 LAB — URINALYSIS, ROUTINE W REFLEX MICROSCOPIC
Bilirubin Urine: NEGATIVE
Glucose, UA: NEGATIVE mg/dL
Hgb urine dipstick: NEGATIVE
Leukocytes,Ua: NEGATIVE
Nitrite: NEGATIVE
Protein, ur: 30 mg/dL — AB
Specific Gravity, Urine: 1.027 (ref 1.005–1.030)
pH: 6 (ref 5.0–8.0)

## 2021-05-02 LAB — CK: Total CK: 37 U/L — ABNORMAL LOW (ref 49–397)

## 2021-05-02 LAB — LIPASE, BLOOD: Lipase: <10 U/L — ABNORMAL LOW (ref 11–51)

## 2021-05-02 MED ORDER — ASPIRIN 81 MG PO CHEW
324.0000 mg | CHEWABLE_TABLET | Freq: Once | ORAL | Status: AC
  Administered 2021-05-02: 324 mg via ORAL
  Filled 2021-05-02: qty 4

## 2021-05-02 MED ORDER — LACTATED RINGERS IV BOLUS
500.0000 mL | Freq: Once | INTRAVENOUS | Status: AC
  Administered 2021-05-02: 500 mL via INTRAVENOUS

## 2021-05-02 MED ORDER — ACETAMINOPHEN 325 MG PO TABS
650.0000 mg | ORAL_TABLET | Freq: Once | ORAL | Status: AC
  Administered 2021-05-02: 650 mg via ORAL
  Filled 2021-05-02: qty 2

## 2021-05-02 NOTE — ED Notes (Signed)
Patient transported to CT 

## 2021-05-02 NOTE — ED Triage Notes (Addendum)
Pt recently seen here for a kidney stone. Pt woke up at 0400 this am, pt slid out of his bed, family found him this am. He was unable to stand, family brought him here d/t back pain from the fall. Pt denies hitting his head.   Pt's blood thinners were dc'd Sunday, 27th for upcoming lithotripsy surgery, 12/20. Pt was scheduled for pre test blood work today    Pt was found legs on the stool at the foot of bed, bottom almost touching floor, arms/elbows holding on to bed post and mattress and head on bed resting on mattress

## 2021-05-02 NOTE — ED Provider Notes (Signed)
Care of the patient assumed at the change of shift. Here for a fall, general weakness. His ED workup is essentially at baseline. Repeat trop is unchanged. He has a known bladder stone but able to urinate small amounts. No signs of obstruction. Scheduled for removal in about 2 weeks. He is able to stand and walk with assistance which is his baseline. Patient and family are comfortably going home.    Pollyann Savoy, MD 05/02/21 [phone]

## 2021-05-02 NOTE — ED Provider Notes (Signed)
[facility] EMERGENCY DEPT Provider Note   CSN: [id number] Arrival date & time: 05/02/21  1221     History Chief Complaint  Patient presents with   Lytle Michaels    Edgar Thomas is a 87 y.o. male.   Fall Pertinent negatives include no chest pain, no abdominal pain, no headaches and no shortness of breath. Patient presents following a slide out of bed.  He has been diagnosed with a 1.8 cm stone in his bladder.  Since his diagnosis, he has been urinating in small amounts.  Early this morning, patient was sliding out of bed to get to the bathroom.  There is a piece of furniture next to the bed, described as a cushioned bench.  He ended up wedged between the bed and this piece of furniture.  He remained there until family woke up and found him in this position.  Since this episode, he has had lower back pain, which is new for him.  He denies any other areas of discomfort.  For his bladder stone, patient has a lithotripsy planned for the 20th.  His blood thinners were discontinued recently.  His other medical history is notable for atrial fibrillation.  Patient takes metoprolol and Tikosyn.  He has not taken his morning doses yet.       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   Hematoma of right thigh 11/19/2018   Symptomatic anemia 11/08/2018   Pseudophakia 03/06/2018   S/P laser cataract surgery 03/06/2018   AMD (age-related macular degeneration), bilateral 02/26/2018   Combined form of senile cataract 02/17/2018    Herpes zoster with complication [id number]   UTI (urinary tract infection) 02/18/2016   Acute on chronic combined systolic and diastolic congestive heart failure ([place]) 02/17/2016   CHF (congestive heart failure) ([place]) 02/17/2016   Gait disturbance 02/17/2016   Near syncope 09/03/2015   Hyponatremia 09/03/2015   Bronchitis 08/29/2015   Encounter for therapeutic drug monitoring 12/12/2013   Anticoagulated on warfarin 12/10/2013   Anticoagulated 12/10/2013   Cardiomyopathy, ischemic- EF 40-45% 12/09/2013   Atrial fibrillation with RVR ([place]) 12/05/2013   CAD- last PCI [facility] yrs ago at [facility] 12/05/2013   GERD (gastroesophageal reflux disease)    Hyperlipemia    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 LIMA to LAD, sequential SVG to OM 1 & OM 2, SVG to PD;  Surgeon: Grace Isaac, MD;  Location: [facility];  Service: Open Heart Surgery;  Laterality: N/A;   ENDOVEIN HARVEST OF GREATER SAPHENOUS VEIN Right  12/03/2013   Procedure: ENDOVEIN HARVEST OF GREATER SAPHENOUS VEIN;  Surgeon: Grace Isaac, MD;  Location: [facility];  Service: Open Heart Surgery;  Laterality: Right;   INTRAOPERATIVE TRANSESOPHAGEAL ECHOCARDIOGRAM N/A 12/03/2013   Procedure: INTRAOPERATIVE TRANSESOPHAGEAL ECHOCARDIOGRAM;  Surgeon: Grace Isaac, MD;  Location: [facility];  Service: Open Heart Surgery;  Laterality: N/A;       Family History  Problem Relation Age of Onset   Heart disease Mother        died at age 52 of heart attack   Leukemia Sister    Heart disease Brother    Hypertension Brother     Social History   Tobacco Use   Smoking status: Never   Smokeless  tobacco: Never  Substance Use Topics   Alcohol use: No   Drug use: No    Home Medications Prior to Admission medications   Medication Sig Start Date End Date Taking? Authorizing Provider  apixaban (ELIQUIS) 5 MG TABS tablet Take 5 mg by mouth 2 (two) times daily.    [provider]  benzonatate (TESSALON) 100 MG capsule Take 1 capsule (100 mg total) by mouth 3 (three) times daily as needed for cough. 08/30/15   Rai, Ripudeep Raliegh Ip, MD  Cholecalciferol (VITAMIN D-3) 25 MCG (1000 UT) CAPS Take 1,000 Units by mouth daily after breakfast.    [provider]  CRESTOR 20 MG tablet Take 20 mg by mouth every morning.  08/26/14   [provider]  dofetilide (TIKOSYN) 125 MCG capsule Take 125 mcg by mouth 2 (two) times daily. 05/20/18   [provider]  ferrous sulfate 325 (65 FE) MG tablet Take 325 mg by mouth daily with breakfast.    [provider]  gabapentin (NEURONTIN) 100 MG capsule Take 100 mg by mouth 2 (two) times a day. Morning and bedtime 04/19/18   [provider]  guaiFENesin (MUCINEX) 600 MG 12 hr tablet Take 2 tablets (1,200 mg total) by mouth 2 (two) times daily. 08/30/15   Rai, Ripudeep K, MD  lisinopril (ZESTRIL) 5 MG tablet Take 5 mg by mouth daily.    [provider]  metoprolol tartrate (LOPRESSOR) 25 MG tablet Take 0.5 tablets (12.5 mg total) by mouth 2 (two) times daily. 11/12/18   Matilde Haymaker, MD  Multiple Vitamins-Minerals (ONE-A-DAY MENS 50+ ADVANTAGE) TABS Take 1 tablet by mouth daily with breakfast.     [provider]  multivitamin-lutein (OCUVITE-LUTEIN) CAPS capsule Take 1 capsule by mouth daily with breakfast.     [provider]  nitroGLYCERIN (NITROSTAT) 0.4 MG SL tablet Place 0.4 mg under the tongue every 5 (five) minutes as needed for chest pain.    [provider]  NON FORMULARY Place under the tongue See admin instructions. CBD oil: Place 2-3 droppersful sublingually 2 times a day     [provider]  Omega 3 1000 MG CAPS Take 1,000 mg by mouth daily with breakfast.     [provider]  polyethylene glycol (MIRALAX / GLYCOLAX) packet Take 17 g by mouth daily as needed for moderate constipation. 08/30/15   Rai, Vernelle Emerald, MD    Allergies    Contrast media [iodinated diagnostic agents], Metrizamide, Other, Amiodarone, Ciprofloxacin, and Tramadol  Review of Systems   Review of Systems  Constitutional:  Negative for activity change, chills, fatigue and fever.  HENT:  Negative for ear pain and sore throat.   Eyes:  Negative for pain and visual disturbance.  Respiratory:  Negative for  cough, chest tightness, shortness of breath and wheezing.   Cardiovascular:  Negative for chest pain, palpitations and leg swelling.  Gastrointestinal:  Negative for abdominal pain, diarrhea, nausea and vomiting.  Genitourinary:  Positive for decreased urine volume. Negative for dysuria, flank pain and hematuria.  Musculoskeletal:  Positive for back pain. Negative for arthralgias, joint swelling, myalgias and neck pain.  Skin:  Negative for color change and rash.  Neurological:  Negative for dizziness, seizures, syncope, speech difficulty, weakness, light-headedness, numbness and headaches.  All other systems reviewed and are negative.  Physical Exam Updated Vital Signs BP (!) 143/96   Pulse (!) 111   Temp 98.5 F (36.9 C)   Resp (!) 27   SpO2 96%   Physical Exam Vitals and nursing note reviewed.  Constitutional:      General: He is not in acute distress.    Appearance: Normal appearance. He is well-developed and normal weight. He is not ill-appearing, toxic-appearing or diaphoretic.  HENT:     Head: Normocephalic and atraumatic.     Right Ear: External ear normal.     Left Ear: External ear normal.     Nose: Nose normal.     Mouth/Throat:     Mouth: Mucous membranes are moist.     Pharynx: Oropharynx is clear.  Eyes:     General: No scleral icterus.     Extraocular Movements: Extraocular movements intact.     Conjunctiva/sclera: Conjunctivae normal.  Cardiovascular:     Rate and Rhythm: Tachycardia present. Rhythm irregular.     Heart sounds: No murmur heard. Pulmonary:     Effort: Pulmonary effort is normal. No respiratory distress.     Breath sounds: Normal breath sounds. No wheezing or rales.  Chest:     Chest wall: No tenderness.  Abdominal:     Palpations: Abdomen is soft.     Tenderness: There is no abdominal tenderness.  Musculoskeletal:        General: No swelling.     Cervical back: Normal range of motion and neck supple. No rigidity.     Right lower leg: No edema.     Left lower leg: No edema.  Skin:    General: Skin is warm and dry.     Capillary Refill: Capillary refill takes less than 2 seconds.     Coloration: Skin is not jaundiced or pale.  Neurological:     General: No focal deficit present.     Mental Status: He is alert and oriented to person, place, and time.     Cranial Nerves: No cranial nerve deficit.     Sensory: No sensory deficit.     Motor: No weakness.     Coordination: Coordination normal.  Psychiatric:        Mood and Affect: Mood normal.        Behavior: Behavior normal.        Thought Content: Thought content normal.        Judgment: Judgment normal.    ED Results / Procedures / Treatments   Labs (all labs ordered are listed, but only abnormal results are displayed) Labs Reviewed  COMPREHENSIVE METABOLIC PANEL - Abnormal; Notable for the following components:      Result Value   Glucose, Bld 117 (*)    Total Bilirubin 1.4 (*)    All other components within normal limits  CBC WITH DIFFERENTIAL/PLATELET - Abnormal; Notable for the following components:   WBC 11.0 (*)    Neutro Abs 8.1 (*)  Monocytes Absolute 1.1 (*)    All other components within normal limits  URINALYSIS, ROUTINE W REFLEX MICROSCOPIC - Abnormal; Notable for the following components:   Ketones, ur TRACE (*)     Protein, ur 30 (*)    All other components within normal limits  LIPASE, BLOOD - Abnormal; Notable for the following components:   Lipase <10 (*)    All other components within normal limits  CK - Abnormal; Notable for the following components:   Total CK 37 (*)    All other components within normal limits  TROPONIN I (HIGH SENSITIVITY) - Abnormal; Notable for the following components:   Troponin I (High Sensitivity) 30 (*)    All other components within normal limits  TROPONIN I (HIGH SENSITIVITY) - Abnormal; Notable for the following components:   Troponin I (High Sensitivity) 31 (*)    All other components within normal limits  MAGNESIUM    EKG EKG Interpretation  Date/Time:  Monday May 02 2021 15:00:33 EST Ventricular Rate:  109 PR Interval:    QRS Duration: 136 QT Interval:  370 QTC Calculation: 499 R Axis:   -74 Text Interpretation: Atrial fibrillation Nonspecific IVCD with LAD LVH with secondary repolarization abnormality Anterior Q waves, possibly due to LVH Confirmed by Godfrey Pick ([id number]) on 05/02/2021 3:07:38 PM  Radiology CT Head Wo Contrast  Result Date: 05/02/2021 CLINICAL DATA:  Fall EXAM: CT HEAD WITHOUT CONTRAST TECHNIQUE: Contiguous axial images were obtained from the base of the skull through the vertex without intravenous contrast. COMPARISON:  Brain MRI 10/28/2020, CT head 04/24/2021 FINDINGS: Brain: There is no evidence of acute intracranial hemorrhage, extra-axial fluid collection, or acute infarct. There is moderate global parenchymal volume loss with enlargement of the ventricular system and extra-axial CSF spaces, unchanged. There is extensive confluent hypodensity throughout the subcortical and periventricular white matter likely reflecting advanced chronic white matter microangiopathy. There are remote infarcts in the right caudate head, right internal capsule, and left basal ganglia and thalamus. There is no solid mass lesion.  There is no midline shift.  Vascular: There is dense calcification in the vertebrobasilar system and cavernous ICAs. Skull: Normal. Negative for fracture or focal lesion. Sinuses/Orbits: There is mild mucosal thickening in the paranasal sinuses. Bilateral lens implants are in place. The globes and orbits are otherwise unremarkable. Other: None. IMPRESSION: 1. No acute intracranial hemorrhage or calvarial fracture. 2. Unchanged global parenchymal volume loss, advanced chronic white matter microangiopathy, and remote lacunar infarcts as above. Electronically Signed   By: Valetta Mole M.D.   On: 05/02/2021 15:10   CT L-SPINE NO CHARGE  Result Date: 05/02/2021 CLINICAL DATA:  Fall EXAM: CT LUMBAR SPINE WITHOUT CONTRAST TECHNIQUE: Multidetector CT imaging of the lumbar spine was performed without intravenous contrast administration. Multiplanar CT image reconstructions were also generated. COMPARISON:  CT abdomen/pelvis 04/24/2021, CT pelvis 11/08/2018 FINDINGS: Segmentation: Standard; the lowest formed disc space is designated L5-S1 Alignment: There is grade 1 anterolisthesis of L4 on L5, unchanged since the prior CT. Alignment is otherwise normal. Vertebrae: Lumbar vertebral body heights are preserved. There is no evidence of acute fracture. There is no suspicious osseous lesion. Paraspinal and other soft tissues: The paraspinal soft tissues are unremarkable. The abdominal and pelvic viscera are described on the separately dictated CT abdomen/pelvis. Disc levels: There is marked disc space narrowing with vacuum disc phenomenon and associated degenerative endplate change at 075-GRM. There is more mild disc space narrowing with vacuum disc phenomenon at L4-L5. The other disc spaces are preserved.  There is mild facet arthropathy throughout the lumbar spine, most advanced at L4-L5. The osseous spinal canal is patent. There is mild bilateral neural foraminal stenosis at L4-L5 and L5-S1. IMPRESSION: 1. No acute fracture or traumatic malalignment of  the lumbar spine. 2. Grade 1 anterolisthesis of L4 on L5, likely degenerative in nature. 3. Disc space narrowing with associated degenerative endplate change and bilateral facet arthropathy at L4-L5 and L5-S1, more advanced at L5-S1. Mild bilateral neural foraminal stenosis at both levels. Electronically Signed   By: Valetta Mole M.D.   On: 05/02/2021 15:06   CT Renal Stone Study  Result Date: 05/02/2021 CLINICAL DATA:  Fall, hematuria, history of kidney stones EXAM: CT ABDOMEN AND PELVIS WITHOUT CONTRAST TECHNIQUE: Multidetector CT imaging of the abdomen and pelvis was performed following the standard protocol without IV contrast. COMPARISON:  CT abdomen/pelvis 04/24/2021 FINDINGS: Lower chest: Reticular opacities in the lung bases likely reflects atelectasis. The heart is enlarged. There are dense coronary artery calcifications. Hepatobiliary: Multiple liver cysts are again seen, unchanged. Scattered parenchymal calcifications are also unchanged likely reflecting small calcified granulomas. The gallbladder is unremarkable. There is no biliary ductal dilatation. Pancreas: The pancreas is atrophic. There are no focal lesions or contour abnormalities. There is no main pancreatic ductal dilatation or peripancreatic inflammatory change. Spleen: Unremarkable. Adrenals/Urinary Tract: The adrenals are unremarkable. A 4 mm nonobstructing left renal stone versus vascular calcification in the interpolar region is unchanged. There are no other stones in either kidney. There are no focal lesions, within the confines of noncontrast technique. There is no hydronephrosis or hydroureter. A large bladder stone measuring 1.8 cm is again seen in the bladder. The stone is now layering dependently at the midline. The decompressed bladder is otherwise unremarkable. Stomach/Bowel: The stomach is unremarkable. There is no evidence of bowel Vascular/Lymphatic: There is dense calcified atherosclerotic plaque throughout the nonaneurysmal  abdominal aorta. There is no abdominal or pelvic lymphadenopathy. Reproductive: The prostate is mildly enlarged. The seminal vesicles are unremarkable. Other: There is no ascites or free air. Musculoskeletal: There is age-indeterminate mild compression deformity of the T10 vertebral body, not imaged on the prior study from 04/24/2021, but favored chronic. Otherwise, there is no acute osseous abnormality or aggressive osseous lesion. IMPRESSION: 1. Large calculus in the bladder measuring up to 1.8 cm layering dependently at the midline. 2. Unchanged 4 mm renal stone versus vascular calcification in the left interpolar region. No hydronephrosis or hydroureter or obstructing stone. 3. Mild age-indeterminate compression deformity of the T10 vertebral body, favored chronic. Correlate with any point tenderness, and MRI may be considered as indicated to evaluate for acuity 4. Cardiomegaly with dense coronary artery calcifications. 5. Moderate stool burden throughout the colon. Aortic Atherosclerosis (ICD10-I70.0). Electronically Signed   By: Valetta Mole M.D.   On: 05/02/2021 15:01    Procedures Procedures   Medications Ordered in ED Medications  acetaminophen (TYLENOL) tablet 650 mg (650 mg Oral Given 05/02/21 1356)  lactated ringers bolus 500 mL ( Intravenous Stopped 05/02/21 1641)  aspirin chewable tablet 324 mg (324 mg Oral Given 05/02/21 1502)    ED Course  I have reviewed the triage vital signs and the nursing notes.  Pertinent labs & imaging results that were available during my care of the patient were reviewed by me and considered in my medical decision making (see chart for details).    MDM Rules/Calculators/A&P  Patient is an 87 year old male who presents to the ED with his wife following a slide out of bed early this morning.  Patient was wedged between the bed and another piece of furniture for several hours.  He has since endorsed lower back pain.  On arrival, vital  signs are notable for tachycardia.  EKG shows atrial fibrillation.  Patient has not taken his morning doses of metoprolol and Tikosyn.  Patient's wife does have these at bedside and did give them in the ED.  He endorses some mild lumbar tenderness.  Tylenol was given for analgesia.  Patient undergo work-up, including imaging of abdomen, pelvis, and lumbar spine.  Due to his known bladder stone and recent decreased urinary amounts.  Patient did undergo a postvoid ultrasound of his bladder.  Patient had minimal postvoid volume in his bladder (approximately 30 cc).  Do not suspect urinary retention.  CT scan did show redemonstration of the stone.  Urinalysis showed no evidence of infection.  Patient's initial troponin was 31.  He continues to deny chest discomfort.  Patient's EKG is abnormal at baseline. For this and was given.  Repeat troponin was ordered.  CT imaging showed no acute findings.  There was an age-indeterminate T10 compression deformity.  This is not in the area of the patient's pain or tenderness.  At time of signout, results of remaining lab work are pending.  Care of patient was signed out to oncoming ED provider.  Final Clinical Impression(s) / ED Diagnoses Final diagnoses:  Fall  Bladder stone  Generalized weakness    Rx / DC Orders ED Discharge Orders     None        Godfrey Pick, MD 05/03/21 1610

## 2021-05-03 DIAGNOSIS — R4182 Altered mental status, unspecified: Secondary | ICD-10-CM | POA: Diagnosis not present

## 2021-05-03 DIAGNOSIS — K219 Gastro-esophageal reflux disease without esophagitis: Secondary | ICD-10-CM | POA: Diagnosis not present

## 2021-05-03 DIAGNOSIS — Z8673 Personal history of transient ischemic attack (TIA), and cerebral infarction without residual deficits: Secondary | ICD-10-CM | POA: Diagnosis not present

## 2021-05-03 DIAGNOSIS — I6782 Cerebral ischemia: Secondary | ICD-10-CM | POA: Diagnosis not present

## 2021-05-03 DIAGNOSIS — I4892 Unspecified atrial flutter: Secondary | ICD-10-CM | POA: Diagnosis not present

## 2021-05-03 DIAGNOSIS — E785 Hyperlipidemia, unspecified: Secondary | ICD-10-CM | POA: Diagnosis not present

## 2021-05-03 DIAGNOSIS — I517 Cardiomegaly: Secondary | ICD-10-CM | POA: Diagnosis not present

## 2021-05-03 DIAGNOSIS — I110 Hypertensive heart disease with heart failure: Secondary | ICD-10-CM | POA: Diagnosis not present

## 2021-05-03 DIAGNOSIS — R509 Fever, unspecified: Secondary | ICD-10-CM | POA: Diagnosis not present

## 2021-05-03 DIAGNOSIS — R079 Chest pain, unspecified: Secondary | ICD-10-CM | POA: Diagnosis not present

## 2021-05-03 DIAGNOSIS — I248 Other forms of acute ischemic heart disease: Secondary | ICD-10-CM | POA: Diagnosis not present

## 2021-05-03 DIAGNOSIS — R6510 Systemic inflammatory response syndrome (SIRS) of non-infectious origin without acute organ dysfunction: Secondary | ICD-10-CM | POA: Diagnosis not present

## 2021-05-03 DIAGNOSIS — I700 Atherosclerosis of aorta: Secondary | ICD-10-CM | POA: Diagnosis not present

## 2021-05-03 DIAGNOSIS — I358 Other nonrheumatic aortic valve disorders: Secondary | ICD-10-CM | POA: Diagnosis not present

## 2021-05-03 DIAGNOSIS — Z951 Presence of aortocoronary bypass graft: Secondary | ICD-10-CM | POA: Diagnosis not present

## 2021-05-03 DIAGNOSIS — G9340 Encephalopathy, unspecified: Secondary | ICD-10-CM | POA: Diagnosis not present

## 2021-05-03 DIAGNOSIS — I69354 Hemiplegia and hemiparesis following cerebral infarction affecting left non-dominant side: Secondary | ICD-10-CM | POA: Diagnosis not present

## 2021-05-03 DIAGNOSIS — J9601 Acute respiratory failure with hypoxia: Secondary | ICD-10-CM | POA: Diagnosis not present

## 2021-05-03 DIAGNOSIS — R7401 Elevation of levels of liver transaminase levels: Secondary | ICD-10-CM | POA: Diagnosis not present

## 2021-05-03 DIAGNOSIS — I771 Stricture of artery: Secondary | ICD-10-CM | POA: Diagnosis not present

## 2021-05-03 DIAGNOSIS — I2510 Atherosclerotic heart disease of native coronary artery without angina pectoris: Secondary | ICD-10-CM | POA: Diagnosis not present

## 2021-05-03 DIAGNOSIS — G3289 Other specified degenerative disorders of nervous system in diseases classified elsewhere: Secondary | ICD-10-CM | POA: Diagnosis not present

## 2021-05-03 DIAGNOSIS — R9431 Abnormal electrocardiogram [ECG] [EKG]: Secondary | ICD-10-CM | POA: Diagnosis not present

## 2021-05-03 DIAGNOSIS — R748 Abnormal levels of other serum enzymes: Secondary | ICD-10-CM | POA: Diagnosis not present

## 2021-05-03 DIAGNOSIS — I480 Paroxysmal atrial fibrillation: Secondary | ICD-10-CM | POA: Diagnosis not present

## 2021-05-03 DIAGNOSIS — I69392 Facial weakness following cerebral infarction: Secondary | ICD-10-CM | POA: Diagnosis not present

## 2021-05-03 DIAGNOSIS — Z20822 Contact with and (suspected) exposure to covid-19: Secondary | ICD-10-CM | POA: Diagnosis not present

## 2021-05-03 DIAGNOSIS — I340 Nonrheumatic mitral (valve) insufficiency: Secondary | ICD-10-CM | POA: Diagnosis not present

## 2021-05-03 DIAGNOSIS — I6381 Other cerebral infarction due to occlusion or stenosis of small artery: Secondary | ICD-10-CM | POA: Diagnosis not present

## 2021-05-03 DIAGNOSIS — J811 Chronic pulmonary edema: Secondary | ICD-10-CM | POA: Diagnosis not present

## 2021-05-03 DIAGNOSIS — R778 Other specified abnormalities of plasma proteins: Secondary | ICD-10-CM | POA: Diagnosis not present

## 2021-05-03 DIAGNOSIS — I69328 Other speech and language deficits following cerebral infarction: Secondary | ICD-10-CM | POA: Diagnosis not present

## 2021-05-03 DIAGNOSIS — R0902 Hypoxemia: Secondary | ICD-10-CM | POA: Diagnosis not present

## 2021-05-03 DIAGNOSIS — I10 Essential (primary) hypertension: Secondary | ICD-10-CM | POA: Diagnosis not present

## 2021-05-03 DIAGNOSIS — A419 Sepsis, unspecified organism: Secondary | ICD-10-CM | POA: Diagnosis not present

## 2021-05-03 DIAGNOSIS — I672 Cerebral atherosclerosis: Secondary | ICD-10-CM | POA: Diagnosis not present

## 2021-05-03 DIAGNOSIS — I5042 Chronic combined systolic (congestive) and diastolic (congestive) heart failure: Secondary | ICD-10-CM | POA: Diagnosis not present

## 2021-05-03 DIAGNOSIS — I4891 Unspecified atrial fibrillation: Secondary | ICD-10-CM | POA: Diagnosis not present

## 2021-05-04 DIAGNOSIS — J811 Chronic pulmonary edema: Secondary | ICD-10-CM | POA: Diagnosis not present

## 2021-05-04 DIAGNOSIS — I517 Cardiomegaly: Secondary | ICD-10-CM | POA: Diagnosis not present

## 2021-05-04 DIAGNOSIS — R509 Fever, unspecified: Secondary | ICD-10-CM | POA: Diagnosis not present

## 2021-05-06 NOTE — Progress Notes (Addendum)
COVID swab appointment:  COVID Vaccine Completed: Date COVID Vaccine completed: Has received booster: COVID vaccine manufacturer: Pfizer    [facility] [employer]   Date of COVID positive in last 90 days:  PCP - Simone Curia, MD Cardiologist -   Chest x-ray - 05-05-21 [facility] EKG - 05-02-21 Epic Stress Test -  ECHO - 05-04-21 [facility] Cardiac Cath -  Pacemaker/ICD device last checked: Spinal Cord Stimulator:  Sleep Study -  CPAP -   Fasting Blood Sugar -  Checks Blood Sugar _____ times a day  Blood Thinner Instructions: Eliquis Aspirin Instructions: Last Dose:  Activity level:  Can go up a flight of stairs and perform activities of daily living without stopping and without symptoms of chest pain or shortness of breath.   Able to exercise without symptoms  Unable to go up a flight of stairs without symptoms of      Anesthesia review: CAD, cardiomyopathy, Afib, CHF, hx of storke. S/P CABG. Hx of MI.  Recent hospitalization for altered mental status  Patient denies shortness of breath, fever, cough and chest pain at PAT appointment   Patient verbalized understanding of instructions that were given to them at the PAT appointment. Patient was also instructed that they will need to review over the PAT instructions again at home before surgery.

## 2021-05-06 NOTE — Patient Instructions (Signed)
DUE TO COVID-19 ONLY ONE VISITOR IS ALLOWED TO COME WITH YOU AND STAY IN THE WAITING ROOM ONLY DURING PRE OP AND PROCEDURE.   **NO VISITORS ARE ALLOWED IN THE SHORT STAY AREA OR RECOVERY ROOM!!**   You are not required to quarantine, however you are required to wear a well-fitted mask when you are out and around people not in your household.  Hand Hygiene often Do NOT share personal items Notify your provider if you are in close contact with someone who has COVID or you develop fever 100.4 or greater, new onset of sneezing, cough, sore throat, shortness of breath or body aches.        Your procedure is scheduled on:  Tuesday, 05-17-21   Report to [facility] Main  Entrance     Report to admitting at 5:15 AM   Call this number if you have problems the morning of surgery [phone]   Do not eat food :After Midnight.   May have liquids until 4:30 AM day of surgery  CLEAR LIQUID DIET  Foods Allowed                                                                     Foods Excluded  Water, Black Coffee (no milk/no creamer) and tea, regular and decaf                              liquids that you cannot  Plain Jell-O in any flavor  (No red)                         see through such as: Fruit ices (not with fruit pulp)                                 milk, soups, orange juice  Iced Popsicles (No red)                                    All solid food                             Apple juices Sports drinks like Gatorade (No red) Lightly seasoned clear broth or consume(fat free) Sugar   Oral Hygiene is also important to reduce your risk of infection.                                    Remember - BRUSH YOUR TEETH THE MORNING OF SURGERY WITH YOUR REGULAR TOOTHPASTE   Do NOT smoke after Midnight   Take these medicines the morning of surgery with A SIP OF WATER:  Crestor, Dofetilide, Metoprolol  DO NOT TAKE ANY ORAL DIABETIC MEDICATIONS DAY OF YOUR SURGERY                     Eliquis/Plavix   Stop all vitamins and herbal supplements a week before surgery   Bring CPAP  mask and tubing day of surgery             You may not have any metal on your body including  jewelry, and body piercing             Do not wear lotions, powders, cologne, or deodorant              Men may shave face and neck.  Do not bring valuables to the hospital. [facility] IS NOT RESPONSIBLE FOR VALUABLES.   Contacts, dentures or bridgework may not be worn into surgery.    Patients discharged the day of surgery will not be allowed to drive home.  Special Instructions: Bring a copy of your healthcare power of attorney and living will documents the day of surgery if you haven't scanned them in before.  Please read over the following fact sheets you were given: IF YOU HAVE QUESTIONS ABOUT YOUR PRE OP INSTRUCTIONS PLEASE CALL [phone]   [facility] - Preparing for Surgery Before surgery, you can play an important role.  Because skin is not sterile, your skin needs to be as free of germs as possible.  You can reduce the number of germs on your skin by washing with CHG (chlorahexidine gluconate) soap before surgery.  CHG is an antiseptic cleaner which kills germs and bonds with the skin to continue killing germs even after washing. Please DO NOT use if you have an allergy to CHG or antibacterial soaps.  If your skin becomes reddened/irritated stop using the CHG and inform your nurse when you arrive at Short Stay. Do not shave (including legs and underarms) for at least 48 hours prior to the first CHG shower.  You may shave your face/neck.  Please follow these instructions carefully:  1.  Shower with CHG Soap the night before surgery and the  morning of surgery.  2.  If you choose to wash your hair, wash your hair first as usual with your normal  shampoo.  3.  After you shampoo, rinse your hair and body thoroughly to remove the shampoo.                             4.  Use CHG as you would  any other liquid soap.  You can apply chg directly to the skin and wash.  Gently with a scrungie or clean washcloth.  5.  Apply the CHG Soap to your body ONLY FROM THE NECK DOWN.   Do   not use on face/ open                           Wound or open sores. Avoid contact with eyes, ears mouth and   genitals (private parts).                       Wash face,  Genitals (private parts) with your normal soap.             6.  Wash thoroughly, paying special attention to the area where your    surgery  will be performed.  7.  Thoroughly rinse your body with warm water from the neck down.  8.  DO NOT shower/wash with your normal soap after using and rinsing off the CHG Soap.                9.  Pat yourself dry with  a clean towel.            10.  Wear clean pajamas.            11.  Place clean sheets on your bed the night of your first shower and do not  sleep with pets. Day of Surgery : Do not apply any lotions/deodorants the morning of surgery.  Please wear clean clothes to the hospital/surgery center.  FAILURE TO FOLLOW THESE INSTRUCTIONS MAY RESULT IN THE CANCELLATION OF YOUR SURGERY  PATIENT SIGNATURE_________________________________  NURSE SIGNATURE__________________________________  ________________________________________________________________________

## 2021-05-10 ENCOUNTER — Encounter (HOSPITAL_COMMUNITY)
Admission: RE | Admit: 2021-05-10 | Discharge: 2021-05-10 | Disposition: A | Discharge status: Home / Self Care | Payer: Medicare Other | Source: Ambulatory Visit | Attending: Internal Medicine | Admitting: Internal Medicine

## 2021-05-10 NOTE — Progress Notes (Signed)
Patient scheduled for PAT appointment today but is currently in hospital in [place].  Spoke to [facility] at Dr. Belva Crome office and advised her of this. She will discuss with Dr. Annabell Howells and let us know about pending surgery on 05-17-21.

## 2021-05-13 DIAGNOSIS — B0229 Other postherpetic nervous system involvement: Secondary | ICD-10-CM | POA: Diagnosis not present

## 2021-05-13 DIAGNOSIS — I2510 Atherosclerotic heart disease of native coronary artery without angina pectoris: Secondary | ICD-10-CM | POA: Diagnosis not present

## 2021-05-13 DIAGNOSIS — I959 Hypotension, unspecified: Secondary | ICD-10-CM | POA: Diagnosis not present

## 2021-05-13 DIAGNOSIS — R600 Localized edema: Secondary | ICD-10-CM | POA: Diagnosis not present

## 2021-05-17 ENCOUNTER — Ambulatory Visit: Admission: RE | Admit: 2021-05-17 | Payer: Medicare Other | Source: Home / Self Care | Admitting: Urology

## 2021-05-17 ENCOUNTER — Encounter: Admission: RE | Payer: Self-pay | Source: Home / Self Care

## 2021-05-17 ENCOUNTER — Inpatient Hospital Stay (HOSPITAL_COMMUNITY)
Admission: EM | Admit: 2021-05-17 | Discharge: 2021-05-19 | DRG: 193 | Disposition: A | Discharge status: Home Health | Payer: Medicare Other | Attending: Internal Medicine | Admitting: Internal Medicine

## 2021-05-17 ENCOUNTER — Other Ambulatory Visit: Payer: Self-pay

## 2021-05-17 ENCOUNTER — Emergency Department (HOSPITAL_COMMUNITY): Payer: Medicare Other

## 2021-05-17 DIAGNOSIS — I252 Old myocardial infarction: Secondary | ICD-10-CM | POA: Diagnosis not present

## 2021-05-17 DIAGNOSIS — J09X9 Influenza due to identified novel influenza A virus with other manifestations: Secondary | ICD-10-CM | POA: Diagnosis not present

## 2021-05-17 DIAGNOSIS — Z8673 Personal history of transient ischemic attack (TIA), and cerebral infarction without residual deficits: Secondary | ICD-10-CM

## 2021-05-17 DIAGNOSIS — G9341 Metabolic encephalopathy: Secondary | ICD-10-CM | POA: Diagnosis not present

## 2021-05-17 DIAGNOSIS — Z20822 Contact with and (suspected) exposure to covid-19: Secondary | ICD-10-CM | POA: Diagnosis not present

## 2021-05-17 DIAGNOSIS — Z7901 Long term (current) use of anticoagulants: Secondary | ICD-10-CM

## 2021-05-17 DIAGNOSIS — J101 Influenza due to other identified influenza virus with other respiratory manifestations: Secondary | ICD-10-CM | POA: Diagnosis present

## 2021-05-17 DIAGNOSIS — I517 Cardiomegaly: Secondary | ICD-10-CM | POA: Diagnosis not present

## 2021-05-17 DIAGNOSIS — Z951 Presence of aortocoronary bypass graft: Secondary | ICD-10-CM

## 2021-05-17 DIAGNOSIS — I5042 Chronic combined systolic (congestive) and diastolic (congestive) heart failure: Secondary | ICD-10-CM | POA: Diagnosis not present

## 2021-05-17 DIAGNOSIS — E782 Mixed hyperlipidemia: Secondary | ICD-10-CM | POA: Diagnosis present

## 2021-05-17 DIAGNOSIS — I10 Essential (primary) hypertension: Secondary | ICD-10-CM | POA: Diagnosis not present

## 2021-05-17 DIAGNOSIS — Z8249 Family history of ischemic heart disease and other diseases of the circulatory system: Secondary | ICD-10-CM | POA: Diagnosis not present

## 2021-05-17 DIAGNOSIS — R5383 Other fatigue: Secondary | ICD-10-CM | POA: Diagnosis not present

## 2021-05-17 DIAGNOSIS — I482 Chronic atrial fibrillation, unspecified: Secondary | ICD-10-CM | POA: Diagnosis not present

## 2021-05-17 DIAGNOSIS — N390 Urinary tract infection, site not specified: Secondary | ICD-10-CM | POA: Diagnosis not present

## 2021-05-17 DIAGNOSIS — N210 Calculus in bladder: Secondary | ICD-10-CM | POA: Diagnosis present

## 2021-05-17 DIAGNOSIS — B0229 Other postherpetic nervous system involvement: Secondary | ICD-10-CM | POA: Diagnosis not present

## 2021-05-17 DIAGNOSIS — E559 Vitamin D deficiency, unspecified: Secondary | ICD-10-CM | POA: Diagnosis not present

## 2021-05-17 DIAGNOSIS — Z885 Allergy status to narcotic agent status: Secondary | ICD-10-CM | POA: Diagnosis not present

## 2021-05-17 DIAGNOSIS — R531 Weakness: Secondary | ICD-10-CM | POA: Diagnosis not present

## 2021-05-17 DIAGNOSIS — R319 Hematuria, unspecified: Secondary | ICD-10-CM

## 2021-05-17 DIAGNOSIS — I2510 Atherosclerotic heart disease of native coronary artery without angina pectoris: Secondary | ICD-10-CM | POA: Diagnosis present

## 2021-05-17 DIAGNOSIS — B379 Candidiasis, unspecified: Secondary | ICD-10-CM | POA: Diagnosis not present

## 2021-05-17 DIAGNOSIS — Z66 Do not resuscitate: Secondary | ICD-10-CM | POA: Diagnosis present

## 2021-05-17 DIAGNOSIS — J1000 Influenza due to other identified influenza virus with unspecified type of pneumonia: Secondary | ICD-10-CM | POA: Diagnosis not present

## 2021-05-17 DIAGNOSIS — I110 Hypertensive heart disease with heart failure: Secondary | ICD-10-CM | POA: Diagnosis not present

## 2021-05-17 DIAGNOSIS — Z91041 Radiographic dye allergy status: Secondary | ICD-10-CM

## 2021-05-17 DIAGNOSIS — E860 Dehydration: Secondary | ICD-10-CM | POA: Diagnosis not present

## 2021-05-17 DIAGNOSIS — Z806 Family history of leukemia: Secondary | ICD-10-CM | POA: Diagnosis not present

## 2021-05-17 DIAGNOSIS — Z881 Allergy status to other antibiotic agents status: Secondary | ICD-10-CM

## 2021-05-17 DIAGNOSIS — R0902 Hypoxemia: Secondary | ICD-10-CM | POA: Diagnosis not present

## 2021-05-17 DIAGNOSIS — K219 Gastro-esophageal reflux disease without esophagitis: Secondary | ICD-10-CM | POA: Diagnosis present

## 2021-05-17 DIAGNOSIS — Z888 Allergy status to other drugs, medicaments and biological substances status: Secondary | ICD-10-CM

## 2021-05-17 DIAGNOSIS — K7689 Other specified diseases of liver: Secondary | ICD-10-CM | POA: Diagnosis not present

## 2021-05-17 LAB — RESP PANEL BY RT-PCR (FLU A&B, COVID) ARPGX2
Influenza A by PCR: POSITIVE — AB
Influenza B by PCR: NEGATIVE
SARS Coronavirus 2 by RT PCR: NEGATIVE

## 2021-05-17 LAB — BRAIN NATRIURETIC PEPTIDE: B Natriuretic Peptide: 627.9 pg/mL — ABNORMAL HIGH (ref 0.0–100.0)

## 2021-05-17 LAB — CBC WITH DIFFERENTIAL/PLATELET
Abs Immature Granulocytes: 0.01 10*3/uL (ref 0.00–0.07)
Basophils Absolute: 0 10*3/uL (ref 0.0–0.1)
Basophils Relative: 0 %
Eosinophils Absolute: 0 10*3/uL (ref 0.0–0.5)
Eosinophils Relative: 0 %
HCT: 43.4 % (ref 39.0–52.0)
Hemoglobin: 14.1 g/dL (ref 13.0–17.0)
Immature Granulocytes: 0 %
Lymphocytes Relative: 17 %
Lymphs Abs: 1.4 10*3/uL (ref 0.7–4.0)
MCH: 32.1 pg (ref 26.0–34.0)
MCHC: 32.5 g/dL (ref 30.0–36.0)
MCV: 98.9 fL (ref 80.0–100.0)
Monocytes Absolute: 1.3 10*3/uL — ABNORMAL HIGH (ref 0.1–1.0)
Monocytes Relative: 17 %
Neutro Abs: 5.3 10*3/uL (ref 1.7–7.7)
Neutrophils Relative %: 66 %
Platelets: 265 10*3/uL (ref 150–400)
RBC: 4.39 MIL/uL (ref 4.22–5.81)
RDW: 13.3 % (ref 11.5–15.5)
WBC: 8 10*3/uL (ref 4.0–10.5)
nRBC: 0 % (ref 0.0–0.2)

## 2021-05-17 LAB — URINALYSIS, ROUTINE W REFLEX MICROSCOPIC
Bilirubin Urine: NEGATIVE
Glucose, UA: >=500 mg/dL — AB
Ketones, ur: NEGATIVE mg/dL
Nitrite: NEGATIVE
Protein, ur: NEGATIVE mg/dL
Specific Gravity, Urine: 1.025 (ref 1.005–1.030)
pH: 6 (ref 5.0–8.0)

## 2021-05-17 LAB — COMPREHENSIVE METABOLIC PANEL
ALT: 31 U/L (ref 0–44)
AST: 45 U/L — ABNORMAL HIGH (ref 15–41)
Albumin: 3.2 g/dL — ABNORMAL LOW (ref 3.5–5.0)
Alkaline Phosphatase: 105 U/L (ref 38–126)
Anion gap: 9 (ref 5–15)
BUN: 22 mg/dL (ref 8–23)
CO2: 27 mmol/L (ref 22–32)
Calcium: 8.7 mg/dL — ABNORMAL LOW (ref 8.9–10.3)
Chloride: 102 mmol/L (ref 98–111)
Creatinine, Ser: 1.15 mg/dL (ref 0.61–1.24)
GFR, Estimated: >60 mL/min (ref >60)
Glucose, Bld: 113 mg/dL — ABNORMAL HIGH (ref 70–99)
Potassium: 3.9 mmol/L (ref 3.5–5.1)
Sodium: 138 mmol/L (ref 135–145)
Total Bilirubin: 0.7 mg/dL (ref 0.3–1.2)
Total Protein: 7.2 g/dL (ref 6.5–8.1)

## 2021-05-17 LAB — TROPONIN I (HIGH SENSITIVITY): Troponin I (High Sensitivity): 48 ng/L — ABNORMAL HIGH (ref <18)

## 2021-05-17 LAB — URINALYSIS, MICROSCOPIC (REFLEX): RBC / HPF: >50 RBC/hpf (ref 0–5)

## 2021-05-17 LAB — TSH: TSH: 1.315 u[IU]/mL (ref 0.350–4.500)

## 2021-05-17 LAB — AMMONIA: Ammonia: <10 umol/L (ref 9–35)

## 2021-05-17 SURGERY — CYSTOSCOPY, WITH BLADDER CALCULUS LITHOLAPAXY
Anesthesia: General | Laterality: Bilateral

## 2021-05-17 MED ORDER — SODIUM CHLORIDE 0.9 % IV SOLN
1.0000 g | Freq: Once | INTRAVENOUS | Status: AC
  Administered 2021-05-17: 23:00:00 1 g via INTRAVENOUS
  Filled 2021-05-17: qty 10

## 2021-05-17 NOTE — ED Provider Notes (Signed)
Emergency Medicine Provider Triage Evaluation Note  Edgar Thomas , a 87 y.o. male  was evaluated in triage.  Pt complains of progressive generalized weakness.  Patient was recently admitted at [facility].  He had A. fib, possible pneumonia/CHF, weakness.  Patient had a stroke earlier this year.  Family notes patient has been more weak, unable to perform ADLs, unable to walk.  No fevers, URI symptoms.  Patient states that he is "fine".   Review of Systems  Positive: Weakness Negative: Fever  Physical Exam  BP 114/85 (BP Location: Left Arm)    Pulse 71    Temp 98.5 F (36.9 C) (Oral)    Resp 18    SpO2 94%  Gen:   Awake, no distress   Resp:  Normal effort  MSK:   Moves extremities without difficulty  Other:  healed prior sternotomy scar on chest  Medical Decision Making  Medically screening exam initiated at 3:34 PM.  Appropriate orders placed.  Edgar Thomas was informed that the remainder of the evaluation will be completed by another provider, this initial triage assessment does not replace that evaluation, and the importance of remaining in the ED until their evaluation is complete.     Edgar Crigler, PA-C 05/17/21 1537    Edgar Munch, MD 05/18/21 [phone]

## 2021-05-17 NOTE — ED Notes (Signed)
Patient transported to Ultrasound 

## 2021-05-17 NOTE — ED Notes (Signed)
Provider at bedside

## 2021-05-17 NOTE — ED Provider Notes (Signed)
[facility] EMERGENCY DEPARTMENT Provider Note   CSN: [id number] Arrival date & time: 05/17/21  1522     History Chief Complaint  Patient presents with   Weakness   Fatigue    Edgar Thomas is a 87 y.o. male.  The history is provided by the patient, medical records and a relative. No language interpreter was used.  Weakness Severity:  Severe Onset quality:  Gradual Timing:  Constant Progression:  Worsening Relieved by:  Nothing Worsened by:  Nothing Ineffective treatments:  None tried Associated symptoms: difficulty walking (too tired), frequency and urgency   Associated symptoms: no abdominal pain, no chest pain, no cough, no diarrhea, no dizziness, no numbness in extremities, no falls, no fever, no foul-smelling urine, no headaches, no loss of consciousness, no nausea, no shortness of breath and no vomiting       Past Medical History:  Diagnosis Date   A-fib ([place])    Anemia    CAD (coronary artery disease)    Constipation    GERD (gastroesophageal reflux disease)    Heart attack ([place])    Heartburn    Hyperlipemia    Hypertension    Post herpetic neuralgia    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   Left lenticulostriate and external capsule   Stroke ([place])    TIA (transient ischemic attack)    Vitamin D deficiency     Patient Active Problem List   Diagnosis Date Noted   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   Hematoma of right thigh 11/19/2018   Symptomatic anemia 11/08/2018   Pseudophakia 03/06/2018   S/P laser cataract surgery 03/06/2018   AMD (age-related macular degeneration), bilateral 02/26/2018   Combined form of senile cataract 02/17/2018   Herpes zoster with complication [id number]   UTI (urinary tract infection) 02/18/2016   Acute on chronic combined systolic and diastolic congestive heart failure ([place]) 02/17/2016   CHF (congestive heart failure) ([place]) 02/17/2016   Gait disturbance 02/17/2016   Near syncope 09/03/2015   Hyponatremia  09/03/2015   Bronchitis 08/29/2015   Encounter for therapeutic drug monitoring 12/12/2013   Anticoagulated on warfarin 12/10/2013   Anticoagulated 12/10/2013   Cardiomyopathy, ischemic- EF 40-45% 12/09/2013   Atrial fibrillation with RVR ([place]) 12/05/2013   CAD- last PCI 19 yrs ago at [facility] 12/05/2013   GERD (gastroesophageal reflux disease)    Hyperlipemia    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 LIMA to LAD, sequential SVG to OM 1 & OM 2, SVG to PD;  Surgeon: Grace Isaac, MD;  Location: [facility];  Service: Open Heart Surgery;  Laterality: N/A;   ENDOVEIN HARVEST OF GREATER SAPHENOUS VEIN Right 12/03/2013   Procedure: ENDOVEIN HARVEST OF GREATER SAPHENOUS VEIN;  Surgeon: Grace Isaac, MD;  Location: [facility];  Service: Open Heart Surgery;  Laterality: Right;   INTRAOPERATIVE TRANSESOPHAGEAL ECHOCARDIOGRAM N/A 12/03/2013   Procedure: INTRAOPERATIVE TRANSESOPHAGEAL ECHOCARDIOGRAM;  Surgeon: Grace Isaac, MD;  Location: [facility];  Service: Open Heart Surgery;  Laterality: N/A;       Family History  Problem Relation Age of Onset  Heart disease Mother        died at age 68 of heart attack   Leukemia Sister    Heart disease Brother    Hypertension Brother     Social History   Tobacco Use   Smoking status: Never   Smokeless tobacco: Never  Substance Use Topics   Alcohol use: No   Drug use: No    Home Medications Prior to Admission medications   Medication Sig Start Date End Date Taking? Authorizing Provider  apixaban (ELIQUIS) 5 MG TABS tablet Take 5 mg by mouth 2 (two) times daily.    [provider]   Cholecalciferol (VITAMIN D-3) 25 MCG (1000 UT) CAPS Take 1,000 Units by mouth daily after breakfast.    [provider]  CRESTOR 20 MG tablet Take 20 mg by mouth every morning.  08/26/14   [provider]  diltiazem (CARDIZEM CD) 120 MG 24 hr capsule Take 120 mg by mouth every morning. 05/10/21   [provider]  dofetilide (TIKOSYN) 125 MCG capsule Take 125 mcg by mouth 2 (two) times daily. 05/20/18   [provider]  ferrous sulfate 325 (65 FE) MG tablet Take 325 mg by mouth daily with breakfast.    [provider]  furosemide (LASIX) 40 MG tablet Take 40 mg by mouth 2 (two) times daily. 05/10/21   [provider]  gabapentin (NEURONTIN) 100 MG capsule Take by mouth. 05/16/21   [provider]  guaiFENesin (MUCINEX) 600 MG 12 hr tablet Take 2 tablets (1,200 mg total) by mouth 2 (two) times daily. Patient taking differently: Take 1,200 mg by mouth 2 (two) times daily as needed for cough. 08/30/15   Rai, Ripudeep K, MD  JARDIANCE 10 MG TABS tablet Take 10 mg by mouth every morning. 05/10/21   [provider]  lisinopril (ZESTRIL) 10 MG tablet Take 10 mg by mouth daily.    [provider]  metoprolol tartrate (LOPRESSOR) 25 MG tablet Take 0.5 tablets (12.5 mg total) by mouth 2 (two) times daily. Patient taking differently: Take 25 mg by mouth 2 (two) times daily. 11/12/18   Matilde Haymaker, MD  metoprolol tartrate (LOPRESSOR) 50 MG tablet Take 50 mg by mouth 2 (two) times daily. 05/10/21   [provider]  Multiple Vitamins-Minerals (ONE-A-DAY MENS 50+ ADVANTAGE) TABS Take 1 tablet by mouth daily with breakfast.     [provider]  multivitamin-lutein (OCUVITE-LUTEIN) CAPS capsule Take 1 capsule by mouth daily with breakfast.     [provider]  nitroGLYCERIN (NITROSTAT) 0.4 MG SL tablet Place 0.4 mg under the tongue every 5 (five) minutes as needed for chest pain.    [provider]   Omega 3 1000 MG CAPS Take 2,000 mg by mouth daily with breakfast.    [provider]  OVER THE COUNTER MEDICATION Take 2 capsules by mouth in the morning and at bedtime. Balance of Nature    [provider]  polyethylene glycol (MIRALAX / GLYCOLAX) packet Take 17 g by mouth daily as needed for moderate constipation. 08/30/15   Rai, Ripudeep K, MD  polyethylene glycol powder (GLYCOLAX/MIRALAX) 17 GM/SCOOP powder Take 17 g by mouth daily. 04/18/21   [provider]    Allergies    Contrast media [iodinated diagnostic agents], Metrizamide, Other, Amiodarone, Ciprofloxacin, and Tramadol  Review of Systems   Review of Systems  Constitutional:  Positive for fatigue. Negative for chills and fever.  HENT:  Negative for congestion.   Eyes:  Negative for visual disturbance.  Respiratory:  Negative for cough, chest tightness, shortness of breath and wheezing.   Cardiovascular:  Negative for chest pain, palpitations and leg swelling.  Gastrointestinal:  Negative for abdominal pain, constipation, diarrhea, nausea and vomiting.  Genitourinary:  Positive for frequency and urgency. Negative for flank pain.  Musculoskeletal:  Negative for back pain and falls.  Skin:  Negative for rash and wound.  Neurological:  Positive for weakness and light-headedness. Negative for dizziness, loss of consciousness and headaches.  Psychiatric/Behavioral:  Positive for confusion. Negative for agitation.   All other systems reviewed and are negative.  Physical Exam Updated Vital Signs BP 114/85 (BP Location: Left Arm)    Pulse 71    Temp 98.5 F (36.9 C) (Oral)    Resp 18    SpO2 94%   Physical Exam Vitals and nursing note reviewed.  Constitutional:      Appearance: He is well-developed. He is ill-appearing. He is not toxic-appearing or diaphoretic.  HENT:     Head: Normocephalic and atraumatic.  Eyes:     Conjunctiva/sclera: Conjunctivae normal.  Cardiovascular:     Rate and Rhythm:  Regular rhythm. Tachycardia present.     Heart sounds: No murmur heard. Pulmonary:     Effort: Pulmonary effort is normal. No respiratory distress.     Breath sounds: Normal breath sounds.  Abdominal:     Palpations: Abdomen is soft.     Tenderness: There is no abdominal tenderness.  Musculoskeletal:        General: No swelling or tenderness.     Cervical back: Neck supple. No tenderness.  Skin:    General: Skin is warm and dry.     Capillary Refill: Capillary refill takes less than 2 seconds.  Neurological:     Mental Status: He is alert.  Psychiatric:        Mood and Affect: Mood normal.    ED Results / Procedures / Treatments   Labs (all labs ordered are listed, but only abnormal results are displayed) Labs Reviewed  RESP PANEL BY RT-PCR (FLU A&B, COVID) ARPGX2 - Abnormal; Notable for the following components:      Result Value   Influenza A by PCR POSITIVE (*)    All other components within normal limits  CBC WITH DIFFERENTIAL/PLATELET - Abnormal; Notable for the following components:   Monocytes Absolute 1.3 (*)    All other components within normal limits  COMPREHENSIVE METABOLIC PANEL - Abnormal; Notable for the following components:   Glucose, Bld 113 (*)    Calcium 8.7 (*)    Albumin 3.2 (*)    AST 45 (*)    All other components within normal limits  URINALYSIS, ROUTINE W REFLEX MICROSCOPIC - Abnormal; Notable for the following components:   Glucose, UA >=500 (*)    Hgb urine dipstick LARGE (*)    Leukocytes,Ua TRACE (*)    All other components within normal limits  BRAIN NATRIURETIC PEPTIDE - Abnormal; Notable for the following components:   B Natriuretic Peptide 627.9 (*)    All other components within normal limits  URINALYSIS, MICROSCOPIC (REFLEX) - Abnormal; Notable for the following components:   Bacteria, UA RARE (*)    All other components within normal limits  TROPONIN I (HIGH SENSITIVITY) - Abnormal; Notable for the following components:   Troponin  I (High Sensitivity) 48 (*)    All other components within normal limits  URINE CULTURE  AMMONIA  TSH    EKG EKG Interpretation  Date/Time:  Tuesday May 17 2021 15:33:03 EST Ventricular Rate:  88 PR Interval:    QRS Duration: 102 QT Interval:  374 QTC Calculation: 452 R Axis:   -72 Text Interpretation: Atrial fibrillation Left axis deviation Inferior infarct , age undetermined Anterior infarct , age undetermined ST & T wave abnormality, consider lateral ischemia Abnormal ECG when compared to prior, similar a fib. No STEMI Confirmed by Theda Belfast ([id number]) on 05/17/2021 5:54:15 PM  Radiology DG Chest 2 View  Result Date: 05/17/2021 CLINICAL DATA:  Weakness, fatigue EXAM: CHEST - 2 VIEW COMPARISON:  Chest radiograph 11/08/2018 FINDINGS: Postsurgical changes reflecting prior CABG are again noted. The heart is enlarged, unchanged. The mediastinal contours are stable. There is no focal consolidation or pulmonary edema. There is no pleural effusion or pneumothorax. There is no acute osseous abnormality. IMPRESSION: Unchanged cardiomegaly. No radiographic evidence of acute cardiopulmonary process. Electronically Signed   By: Lesia Hausen M.D.   On: 05/17/2021 16:25   US RENAL  Result Date: 05/17/2021 CLINICAL DATA:  Urinary bladder stone. EXAM: RENAL / URINARY TRACT ULTRASOUND COMPLETE COMPARISON:  CT renal 05/02/2021 FINDINGS: Right Kidney: Renal measurements: 9.6 x 4 x 3.4 cm = volume: 70 mL. Echogenicity within normal limits. No mass or hydronephrosis visualized. Left Kidney: Renal measurements: 8.9 x 5.3 x 4 1 cm = volume: 103 mL. Echogenicity within normal limits. No mass or hydronephrosis visualized. Urinary bladder: There is a 1.9 cm calcified stone within the urinary bladder lumen. Otherwise appears normal for degree of bladder distention. Other: Hepatic cystic lesions incidentally noted as seen on CT renal 05/02/2021. IMPRESSION: 1.  A 1.9 cm calcified stone within the urinary  bladder lumen. 2. Otherwise unremarkable renal ultrasound. Electronically Signed   By: Tish Frederickson M.D.   On: 05/17/2021 22:06    Procedures Procedures   Medications Ordered in ED Medications  cefTRIAXone (ROCEPHIN) 1 g in sodium chloride 0.9 % 100 mL IVPB (0 g Intravenous Stopped 05/17/21 2314)    ED Course  I have reviewed the triage vital signs and the nursing notes.  Pertinent labs & imaging results that were available during my care of the patient were reviewed by me and considered in my medical decision making (see chart for details).    MDM Rules/Calculators/A&P                          Gradie Ohm is a 87 y.o. male with a past medical history significant for CAD with previous PCI and CABG, CHF, GERD, hyperlipidemia, A. fib with RVR on Eliquis therapy, previous stroke, hypertension, recurrent UTIs, and known bladder stone who presents with worsening generalized fatigue, continued urinary urgency, and altered mental status.  According to family, patient was admitted last week for new A. fib and treated for possible pneumonia.  He currently was on doxycycline for the possible pneumonia and possible UTI.  He was post to have his bladder stone removed but as he was feeling ill that was canceled and pushed back and he was told to come in for evaluation.  Family reports he is getting more and more fatigued and somnolent at times.  She reports she has not had any reported nausea or vomiting and has not any diarrhea or constipation but does have the urinary frequency and urgency.  She is concerned about his bladder infection and possible infected stone.  On my evaluation, patient is somnolent but is arousable.  He is difficult to make follow all  commands and had difficulty with finger-nose-finger testing on the left arm compared to the right.  He also was slow to speech which family reports is new.  They do not have a good last known normal and this is all been worsening over the last few  days.  Patient was afebrile but oxygen saturations were dipping occasionally into the 80s when he was asleep.  It does not appear he take oxygen at home.  Patient does have some rales at the bases and some faint rhonchi.  No wheezing.  Clinically I am concerned about multiple causes of altered mental status in this patient.  He clearly has something abnormal.  Patient will have broad work-up including chest x-ray and COVID/flu testing given the dry cough I appreciated, and will also get work-up including MRI given the slow speech and abnormal left upper extremity coordination.  We will order a bladder ultrasound.  10:11 PM Work-up has continued to return.  Patient was found to be flu positive.  I suspect this is also contributing to his symptoms and the intermittent hypoxia I saw at times.  Due to the speech slowness and the left upper extremity difficulty finger-nose-finger testing, will still order MRI but will give antibiotics for likely UTI seen on urine with leukocytes and bacteria and will send a culture.  Ultrasound the bladder does show the stone still present so that may need a consultation to urology tomorrow but I do not feel he needs emergent transfer for this given the new flu positivity as well.  Will call for admission for fatigue, altered mental status, UTI, and now influenza with intermittent hypoxia.  Anticipated MRI during his admission and will defer to medicine team for urology involvement.     Final Clinical Impression(s) / ED Diagnoses Final diagnoses:  Bladder stone  Urinary tract infection with hematuria, site unspecified  Influenza A    Clinical Impression: 1. Urinary tract infection with hematuria, site unspecified   2. Bladder stone   3. Influenza A     Disposition: Admit  This note was prepared with assistance of Dragon voice recognition software. Occasional wrong-word or sound-a-like substitutions may have occurred due to the inherent limitations of voice  recognition software.     Nevah Dalal, Gwenyth Allegra, MD 05/17/21 [phone]

## 2021-05-17 NOTE — ED Triage Notes (Signed)
Pt here w worsening weakness, fatigue. Hospitalized for approx 1wk for new-onset afib, treated for pneumonia. On doxycycline for kidney stone/potential UTI. Advised hx stroke (10/2020)

## 2021-05-17 NOTE — ED Notes (Signed)
Returned from U/S

## 2021-05-18 ENCOUNTER — Encounter (HOSPITAL_COMMUNITY): Payer: Self-pay | Admitting: Internal Medicine

## 2021-05-18 DIAGNOSIS — E782 Mixed hyperlipidemia: Secondary | ICD-10-CM

## 2021-05-18 DIAGNOSIS — J1000 Influenza due to other identified influenza virus with unspecified type of pneumonia: Secondary | ICD-10-CM | POA: Diagnosis not present

## 2021-05-18 DIAGNOSIS — I5042 Chronic combined systolic (congestive) and diastolic (congestive) heart failure: Secondary | ICD-10-CM | POA: Diagnosis not present

## 2021-05-18 DIAGNOSIS — N210 Calculus in bladder: Secondary | ICD-10-CM | POA: Diagnosis not present

## 2021-05-18 DIAGNOSIS — J09X9 Influenza due to identified novel influenza A virus with other manifestations: Secondary | ICD-10-CM

## 2021-05-18 DIAGNOSIS — I482 Chronic atrial fibrillation, unspecified: Secondary | ICD-10-CM

## 2021-05-18 DIAGNOSIS — Z66 Do not resuscitate: Secondary | ICD-10-CM | POA: Diagnosis not present

## 2021-05-18 DIAGNOSIS — J101 Influenza due to other identified influenza virus with other respiratory manifestations: Secondary | ICD-10-CM

## 2021-05-18 DIAGNOSIS — Z20822 Contact with and (suspected) exposure to covid-19: Secondary | ICD-10-CM | POA: Diagnosis not present

## 2021-05-18 DIAGNOSIS — I2510 Atherosclerotic heart disease of native coronary artery without angina pectoris: Secondary | ICD-10-CM

## 2021-05-18 DIAGNOSIS — I10 Essential (primary) hypertension: Secondary | ICD-10-CM

## 2021-05-18 DIAGNOSIS — G9341 Metabolic encephalopathy: Secondary | ICD-10-CM | POA: Diagnosis not present

## 2021-05-18 LAB — CBC WITH DIFFERENTIAL/PLATELET
Abs Immature Granulocytes: 0.01 10*3/uL (ref 0.00–0.07)
Basophils Absolute: 0 10*3/uL (ref 0.0–0.1)
Basophils Relative: 1 %
Eosinophils Absolute: 0.1 10*3/uL (ref 0.0–0.5)
Eosinophils Relative: 2 %
HCT: 46.3 % (ref 39.0–52.0)
Hemoglobin: 15.1 g/dL (ref 13.0–17.0)
Immature Granulocytes: 0 %
Lymphocytes Relative: 23 %
Lymphs Abs: 1.3 10*3/uL (ref 0.7–4.0)
MCH: 32.1 pg (ref 26.0–34.0)
MCHC: 32.6 g/dL (ref 30.0–36.0)
MCV: 98.5 fL (ref 80.0–100.0)
Monocytes Absolute: 1 10*3/uL (ref 0.1–1.0)
Monocytes Relative: 17 %
Neutro Abs: 3.3 10*3/uL (ref 1.7–7.7)
Neutrophils Relative %: 57 %
Platelets: 252 10*3/uL (ref 150–400)
RBC: 4.7 MIL/uL (ref 4.22–5.81)
RDW: 13.3 % (ref 11.5–15.5)
WBC: 5.8 10*3/uL (ref 4.0–10.5)
nRBC: 0 % (ref 0.0–0.2)

## 2021-05-18 LAB — C-REACTIVE PROTEIN: CRP: 0.7 mg/dL (ref <1.0)

## 2021-05-18 LAB — COMPREHENSIVE METABOLIC PANEL
ALT: 30 U/L (ref 0–44)
AST: 43 U/L — ABNORMAL HIGH (ref 15–41)
Albumin: 3.2 g/dL — ABNORMAL LOW (ref 3.5–5.0)
Alkaline Phosphatase: 103 U/L (ref 38–126)
Anion gap: 9 (ref 5–15)
BUN: 23 mg/dL (ref 8–23)
CO2: 24 mmol/L (ref 22–32)
Calcium: 8.7 mg/dL — ABNORMAL LOW (ref 8.9–10.3)
Chloride: 102 mmol/L (ref 98–111)
Creatinine, Ser: 0.96 mg/dL (ref 0.61–1.24)
GFR, Estimated: >60 mL/min (ref >60)
Glucose, Bld: 94 mg/dL (ref 70–99)
Potassium: 3.6 mmol/L (ref 3.5–5.1)
Sodium: 135 mmol/L (ref 135–145)
Total Bilirubin: 0.8 mg/dL (ref 0.3–1.2)
Total Protein: 7.5 g/dL (ref 6.5–8.1)

## 2021-05-18 LAB — PROCALCITONIN: Procalcitonin: <0.1 ng/mL

## 2021-05-18 LAB — MAGNESIUM
Magnesium: 2.2 mg/dL (ref 1.7–2.4)
Magnesium: 2.2 mg/dL (ref 1.7–2.4)

## 2021-05-18 LAB — VITAMIN B12: Vitamin B-12: 660 pg/mL (ref 180–914)

## 2021-05-18 LAB — TROPONIN I (HIGH SENSITIVITY): Troponin I (High Sensitivity): 51 ng/L — ABNORMAL HIGH (ref <18)

## 2021-05-18 MED ORDER — LACTATED RINGERS IV SOLN: INTRAVENOUS | Status: DC

## 2021-05-18 MED ORDER — METOPROLOL TARTRATE 5 MG/5ML IV SOLN
5.0000 mg | Freq: Once | INTRAVENOUS | Status: AC
  Administered 2021-05-18: 04:00:00 5 mg via INTRAVENOUS
  Filled 2021-05-18: qty 5

## 2021-05-18 MED ORDER — LISINOPRIL 10 MG PO TABS
10.0000 mg | ORAL_TABLET | Freq: Every day | ORAL | Status: DC
  Administered 2021-05-18 – 2021-05-19 (×2): 10 mg via ORAL
  Filled 2021-05-18 (×2): qty 1

## 2021-05-18 MED ORDER — SENNOSIDES-DOCUSATE SODIUM 8.6-50 MG PO TABS: 1.0000 | ORAL_TABLET | Freq: Every evening | ORAL | Status: DC | PRN

## 2021-05-18 MED ORDER — LACTATED RINGERS IV SOLN: INTRAVENOUS | Status: AC

## 2021-05-18 MED ORDER — DILTIAZEM HCL ER COATED BEADS 120 MG PO CP24
120.0000 mg | ORAL_CAPSULE | Freq: Every morning | ORAL | Status: DC
  Filled 2021-05-18: qty 1

## 2021-05-18 MED ORDER — METOPROLOL TARTRATE 5 MG/5ML IV SOLN: 5.0000 mg | INTRAVENOUS | Status: DC | PRN

## 2021-05-18 MED ORDER — ACETAMINOPHEN 650 MG RE SUPP: 650.0000 mg | Freq: Four times a day (QID) | RECTAL | Status: DC | PRN

## 2021-05-18 MED ORDER — REVEFENACIN 175 MCG/3ML IN SOLN
175.0000 ug | Freq: Every day | RESPIRATORY_TRACT | Status: DC
  Filled 2021-05-18: qty 3

## 2021-05-18 MED ORDER — IPRATROPIUM-ALBUTEROL 0.5-2.5 (3) MG/3ML IN SOLN: 3.0000 mL | RESPIRATORY_TRACT | Status: DC | PRN

## 2021-05-18 MED ORDER — DOFETILIDE 125 MCG PO CAPS
125.0000 ug | ORAL_CAPSULE | Freq: Two times a day (BID) | ORAL | Status: DC
  Administered 2021-05-18 – 2021-05-19 (×3): 125 ug via ORAL
  Filled 2021-05-18 (×4): qty 1

## 2021-05-18 MED ORDER — METOPROLOL TARTRATE 25 MG PO TABS
25.0000 mg | ORAL_TABLET | Freq: Two times a day (BID) | ORAL | Status: DC
  Administered 2021-05-18 (×2): 25 mg via ORAL
  Filled 2021-05-18 (×2): qty 1

## 2021-05-18 MED ORDER — HYDRALAZINE HCL 20 MG/ML IJ SOLN
10.0000 mg | Freq: Four times a day (QID) | INTRAMUSCULAR | Status: DC | PRN
  Administered 2021-05-18: 04:00:00 10 mg via INTRAVENOUS
  Filled 2021-05-18: qty 1

## 2021-05-18 MED ORDER — HYDRALAZINE HCL 20 MG/ML IJ SOLN: 10.0000 mg | INTRAMUSCULAR | Status: DC | PRN

## 2021-05-18 MED ORDER — ARFORMOTEROL TARTRATE 15 MCG/2ML IN NEBU
15.0000 ug | INHALATION_SOLUTION | Freq: Two times a day (BID) | RESPIRATORY_TRACT | Status: DC
  Administered 2021-05-18 – 2021-05-19 (×2): 15 ug via RESPIRATORY_TRACT
  Filled 2021-05-18 (×2): qty 2

## 2021-05-18 MED ORDER — ROSUVASTATIN CALCIUM 20 MG PO TABS
20.0000 mg | ORAL_TABLET | Freq: Every morning | ORAL | Status: DC
  Administered 2021-05-18 – 2021-05-19 (×2): 20 mg via ORAL
  Filled 2021-05-18 (×2): qty 1

## 2021-05-18 MED ORDER — ALBUTEROL SULFATE (2.5 MG/3ML) 0.083% IN NEBU: 2.5000 mg | INHALATION_SOLUTION | RESPIRATORY_TRACT | Status: DC | PRN

## 2021-05-18 MED ORDER — DOFETILIDE 125 MCG PO CAPS
125.0000 ug | ORAL_CAPSULE | Freq: Two times a day (BID) | ORAL | Status: DC
  Filled 2021-05-18: qty 1

## 2021-05-18 MED ORDER — POLYETHYLENE GLYCOL 3350 17 G PO PACK
17.0000 g | PACK | Freq: Every day | ORAL | Status: DC | PRN
  Administered 2021-05-19: 06:00:00 17 g via ORAL
  Filled 2021-05-18: qty 1

## 2021-05-18 MED ORDER — POTASSIUM CHLORIDE CRYS ER 20 MEQ PO TBCR
40.0000 meq | EXTENDED_RELEASE_TABLET | Freq: Once | ORAL | Status: AC
  Administered 2021-05-18: 10:00:00 40 meq via ORAL
  Filled 2021-05-18: qty 2

## 2021-05-18 MED ORDER — TRAZODONE HCL 50 MG PO TABS: 50.0000 mg | ORAL_TABLET | Freq: Every evening | ORAL | Status: DC | PRN

## 2021-05-18 MED ORDER — DILTIAZEM HCL ER COATED BEADS 120 MG PO CP24
120.0000 mg | ORAL_CAPSULE | Freq: Every morning | ORAL | Status: DC
  Administered 2021-05-18 – 2021-05-19 (×2): 120 mg via ORAL
  Filled 2021-05-18 (×2): qty 1

## 2021-05-18 MED ORDER — ONDANSETRON HCL 4 MG PO TABS: 4.0000 mg | ORAL_TABLET | Freq: Four times a day (QID) | ORAL | Status: DC | PRN

## 2021-05-18 MED ORDER — LACTATED RINGERS IV BOLUS
500.0000 mL | Freq: Once | INTRAVENOUS | Status: AC
  Administered 2021-05-18: 03:00:00 500 mL via INTRAVENOUS

## 2021-05-18 MED ORDER — POTASSIUM CHLORIDE CRYS ER 10 MEQ PO TBCR
20.0000 meq | EXTENDED_RELEASE_TABLET | Freq: Once | ORAL | Status: AC
  Administered 2021-05-18: 03:00:00 20 meq via ORAL
  Filled 2021-05-18: qty 2

## 2021-05-18 MED ORDER — ONDANSETRON HCL 4 MG/2ML IJ SOLN: 4.0000 mg | Freq: Four times a day (QID) | INTRAMUSCULAR | Status: DC | PRN

## 2021-05-18 MED ORDER — CEPHALEXIN 500 MG PO CAPS
500.0000 mg | ORAL_CAPSULE | Freq: Three times a day (TID) | ORAL | Status: DC
  Administered 2021-05-18 – 2021-05-19 (×4): 500 mg via ORAL
  Filled 2021-05-18: qty 1
  Filled 2021-05-18 (×2): qty 2
  Filled 2021-05-18: qty 1

## 2021-05-18 MED ORDER — OXYCODONE HCL 5 MG PO TABS: 5.0000 mg | ORAL_TABLET | ORAL | Status: DC | PRN

## 2021-05-18 MED ORDER — APIXABAN 5 MG PO TABS
5.0000 mg | ORAL_TABLET | Freq: Two times a day (BID) | ORAL | Status: DC
  Administered 2021-05-18 – 2021-05-19 (×3): 5 mg via ORAL
  Filled 2021-05-18 (×3): qty 1

## 2021-05-18 MED ORDER — ACETAMINOPHEN 325 MG PO TABS: 650.0000 mg | ORAL_TABLET | Freq: Four times a day (QID) | ORAL | Status: DC | PRN

## 2021-05-18 MED ORDER — OSELTAMIVIR PHOSPHATE 30 MG PO CAPS
30.0000 mg | ORAL_CAPSULE | Freq: Two times a day (BID) | ORAL | Status: DC
  Administered 2021-05-18 – 2021-05-19 (×4): 30 mg via ORAL
  Filled 2021-05-18 (×7): qty 1

## 2021-05-18 NOTE — ED Notes (Signed)
Provider sent a message reference heart rate at this time

## 2021-05-18 NOTE — ED Notes (Signed)
Admit provider at bedside 

## 2021-05-18 NOTE — Evaluation (Signed)
Occupational Therapy Evaluation Patient Details Name: Edgar Thomas MRN: [id number] DOB: 1930-04-14 Today's Date: 05/18/2021   History of Present Illness 87 y.o. M admitted on 12/20 due to weakness, lethargy, and confusion. Pt found to have influenza A. PMH significant for coronary artery disease status post CABG (0000000), systolic and diastolic congestive heart failure (Echo 05/04/2021 EF 45-50%), chronic atrial fibrillation (on eliquis), hypertension, hyperlipidemia, gastroesophageal reflux disease, and stroke.   Clinical Impression   Pt admitted for concerns listed above. PTA pt's wife reported that she assists him with all ADL's and provides min guard for mobility and mod A for ADL's. At this time, pt presents with increased weakness, difficulty following commands, decreased balance and activity tolerance. Pt requiring min-max A for all ADL's and functional mobility. OT will continue to follow acutely to address concerns listed above.       Recommendations for follow up therapy are one component of a multi-disciplinary discharge planning process, led by the attending physician.  Recommendations may be updated based on patient status, additional functional criteria and insurance authorization.   Follow Up Recommendations  Home health OT    Assistance Recommended at Discharge Frequent or constant Supervision/Assistance  Functional Status Assessment  Patient has had a recent decline in their functional status and demonstrates the ability to make significant improvements in function in a reasonable and predictable amount of time.  Equipment Recommendations  None recommended by OT    Recommendations for Other Services       Precautions / Restrictions Precautions Precautions: Fall Restrictions Weight Bearing Restrictions: No      Mobility Bed Mobility Overal bed mobility: Needs Assistance Bed Mobility: Supine to Sit;Sit to Supine     Supine to sit: Mod assist Sit to supine: Mod  assist   General bed mobility comments: Pt requiring increased assist to come to sitting as well as bring BLE back into bed.    Transfers Overall transfer level: Needs assistance Equipment used: 1 person hand held assist Transfers: Sit to/from Stand Sit to Stand: Max assist           General transfer comment: Pt with posterior lean, requiring max A to stand, as well as taking small shuffle steps with an unsteady gait, reaching for all surfaces.      Balance Overall balance assessment: Needs assistance Sitting-balance support: Bilateral upper extremity supported;Feet supported Sitting balance-Leahy Scale: Fair   Postural control: Posterior lean Standing balance support: Bilateral upper extremity supported Standing balance-Leahy Scale: Poor Standing balance comment: Pt with posterior lean, requiring max support intermittently to remain standing, especially initially.                           ADL either performed or assessed with clinical judgement   ADL Overall ADL's : Needs assistance/impaired Eating/Feeding: Minimal assistance;Sitting   Grooming: Minimal assistance;Sitting   Upper Body Bathing: Minimal assistance;Sitting   Lower Body Bathing: Maximal assistance;Sitting/lateral leans;Sit to/from stand   Upper Body Dressing : Minimal assistance;Sitting   Lower Body Dressing: Maximal assistance;Sitting/lateral leans;Sit to/from stand   Toilet Transfer: Maximal assistance;Ambulation   Toileting- Clothing Manipulation and Hygiene: Maximal assistance;Sitting/lateral lean;Sit to/from stand       Functional mobility during ADLs: Maximal assistance General ADL Comments: Pt with difficulty following commands this session or initiating tasks, requirin gmultimodal cuing as well as increased assist due to weakness and poor balance.     Vision Baseline Vision/History: 0 No visual deficits Ability to See in Adequate Light:  0 Adequate Patient Visual Report: No  change from baseline Vision Assessment?: No apparent visual deficits     Perception     Praxis      Pertinent Vitals/Pain Pain Assessment: No/denies pain     Hand Dominance Right   Extremity/Trunk Assessment Upper Extremity Assessment Upper Extremity Assessment: Overall WFL for tasks assessed   Lower Extremity Assessment Lower Extremity Assessment: Defer to PT evaluation   Cervical / Trunk Assessment Cervical / Trunk Assessment: Kyphotic   Communication Communication Communication: No difficulties   Cognition Arousal/Alertness: Awake/alert Behavior During Therapy: Flat affect Overall Cognitive Status: History of cognitive impairments - at baseline                                 General Comments: Pt slow to respond at times with poor recall and requiring multimodal cuing to follow all commands.     General Comments  VSS on RA    Exercises     Shoulder Instructions      Home Living Family/patient expects to be discharged to:: Private residence Living Arrangements: Spouse/significant other Available Help at Discharge: Family;Available 24 hours/day Type of Home: House Home Access: Stairs to enter [employer] of Steps: 6 steps Entrance Stairs-Rails: Right Home Layout: Two level Alternate Level Stairs-Number of Steps: 2 steps to bed room, 2 steps to living room Alternate Level Stairs-Rails: Can reach both Bathroom Shower/Tub: Occupational psychologist: Handicapped height Bathroom Accessibility: Yes How Accessible: Accessible via walker Home Equipment: Conservation officer, nature (2 wheels);BSC/3in1;Shower seat - built in;Grab bars - toilet;Grab bars - tub/shower;Hand held shower head          Prior Functioning/Environment Prior Level of Function : Needs assist             Mobility Comments: Wife reports holding on to his belt with all mobility as pt refuses to use a rw ADLs Comments: Pt's wife states that she assists him with all  ADL's.        OT Problem List: Decreased strength;Decreased range of motion;Decreased activity tolerance;Impaired balance (sitting and/or standing);Decreased coordination;Decreased cognition;Decreased safety awareness;Decreased knowledge of use of DME or AE;Impaired UE functional use      OT Treatment/Interventions: Self-care/ADL training;Therapeutic exercise;Energy conservation;DME and/or AE instruction;Therapeutic activities;Patient/family education;Balance training    OT Goals(Current goals can be found in the care plan section) Acute Rehab OT Goals Patient Stated Goal: To get stronger OT Goal Formulation: With patient Time For Goal Achievement: 06/01/21 Potential to Achieve Goals: Fair ADL Goals Pt Will Perform Grooming: with supervision;standing Pt Will Perform Lower Body Bathing: with supervision;sitting/lateral leans;sit to/from stand Pt Will Perform Lower Body Dressing: with supervision;sitting/lateral leans;sit to/from stand Pt Will Transfer to Toilet: with supervision;ambulating Pt Will Perform Toileting - Clothing Manipulation and hygiene: with supervision;sitting/lateral leans;sit to/from stand  OT Frequency: Min 2X/week   Barriers to D/C:            Co-evaluation              AM-PAC OT "6 Clicks" Daily Activity     Outcome Measure Help from another person eating meals?: A Little Help from another person taking care of personal grooming?: A Little Help from another person toileting, which includes using toliet, bedpan, or urinal?: A Lot Help from another person bathing (including washing, rinsing, drying)?: A Lot Help from another person to put on and taking off regular upper body clothing?: A Little Help from another  person to put on and taking off regular lower body clothing?: A Lot 6 Click Score: 15   End of Session Equipment Utilized During Treatment: Gait belt Nurse Communication: Mobility status  Activity Tolerance: Patient limited by fatigue Patient  left: in bed;with call bell/phone within reach;with bed alarm set;with family/visitor present  OT Visit Diagnosis: Unsteadiness on feet (R26.81);Other abnormalities of gait and mobility (R26.89);Muscle weakness (generalized) (M62.81)                Time: 1700-1720 OT Time Calculation (min): 20 min Charges:  OT General Charges $OT Visit: 1 Visit OT Evaluation $OT Eval Moderate Complexity: 1 Mod  Conn Trombetta H., OTR/L Acute Rehabilitation  Roye Gustafson Elane Bing Plume 05/18/2021, 5:46 PM

## 2021-05-18 NOTE — ED Notes (Signed)
Provider made aware of bp being elevated at this time

## 2021-05-18 NOTE — Assessment & Plan Note (Addendum)
•   Patient is currently chest pain free  Monitoring patient on telemetry  Continue home regimen of anticoagulation, lipid lowering therapy and AV nodal blocking therapy  PRN Nitroglycerin

## 2021-05-18 NOTE — ED Notes (Signed)
ED TO INPATIENT HANDOFF REPORT  ED Nurse Name and Phone #: [phone]  S Name/Age/Gender Edgar Thomas 87 y.o. male Room/Bed: 022C/022C  Code Status   Code Status: DNR  Home/SNF/Other Home Patient oriented to: self and place Is this baseline? No   Triage Complete: Triage complete  Chief Complaint Acute metabolic encephalopathy [id number] Influenza A [J10.1]  Triage Note Pt here w worsening weakness, fatigue. Hospitalized for approx 1wk for new-onset afib, treated for pneumonia. On doxycycline for kidney stone/potential UTI. Advised hx stroke (10/2020)   Allergies Allergies  Allergen Reactions   Contrast Media [Iodinated Diagnostic Agents] Rash    19 years ago at [facility]   Metrizamide Rash    19 years ago at [facility] ([place])   Other Other (See Comments)    Patient preference: NO MEAT OR DAIRY!!   Amiodarone Other (See Comments)    Makes the patient walk sideways and he falls   Ciprofloxacin Other (See Comments)    Was told by MD to "not take"   Tramadol Nausea And Vomiting    Level of Care/Admitting Diagnosis ED Disposition     ED Disposition  Admit   Condition  --   [place]: [place] [[id number]]  Level of Care: Progressive [102]  Admit to Progressive based on following criteria: CARDIOVASCULAR & THORACIC of moderate stability with acute coronary syndrome symptoms/low risk myocardial infarction/hypertensive urgency/arrhythmias/heart failure potentially compromising stability and stable post cardiovascular intervention patients.  May admit patient to [facility] or [facility] if equivalent level of care is available:: No  Covid Evaluation: Confirmed COVID Negative  Diagnosis: Influenza A [id number]  Admitting Physician: Vernelle Emerald [id number]  Attending Physician: Vernelle Emerald [id number]  Estimated length of stay: 3 - 4 days  Certification:: I certify this patient will need inpatient  services for at least 2 midnights          B Medical/Surgery History Past Medical History:  Diagnosis Date   A-fib ([place])    Anemia    CAD (coronary artery disease)    Constipation    GERD (gastroesophageal reflux disease)    Heart attack ([place])    Heartburn    Hyperlipemia    Hypertension    Post herpetic neuralgia    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 LIMA to LAD, sequential SVG to OM 1 & OM 2, SVG to PD;  Surgeon: Grace Isaac, MD;  Location: [place];  Service: Open Heart Surgery;  Laterality: N/A;   ENDOVEIN HARVEST OF GREATER SAPHENOUS VEIN Right 12/03/2013   Procedure: ENDOVEIN HARVEST OF GREATER SAPHENOUS VEIN;  Surgeon: Grace Isaac, MD;  Location: [place];  Service: Open Heart Surgery;  Laterality: Right;   INTRAOPERATIVE TRANSESOPHAGEAL ECHOCARDIOGRAM N/A 12/03/2013   Procedure: INTRAOPERATIVE TRANSESOPHAGEAL ECHOCARDIOGRAM;  Surgeon: Grace Isaac, MD;  Location: [place];  Service: Open Heart Surgery;  Laterality: N/A;     A IV Location/Drains/Wounds Patient Lines/Drains/Airways Status  Active Line/Drains/Airways     Name Placement date Placement time Site Days   Peripheral IV 05/17/21 18 G Right Antecubital 05/17/21  1910  Antecubital  1   Peripheral IV 05/18/21 20 G Posterior;Right Forearm 05/18/21  0514  Forearm  less than 1            Intake/Output Last 24 hours  Intake/Output Summary (Last 24 hours) at 05/18/2021 1424 Last data filed at 05/18/2021 [id number] Gross per 24 hour  Intake  594.87 ml  Output --  Net 594.87 ml    Labs/Imaging Results for orders placed or performed during the hospital encounter of 05/17/21 (from the past 48 hour(s))  Urinalysis, Routine w reflex microscopic Nasopharyngeal Swab     Status: Abnormal   Collection Time: 05/17/21  3:33 PM  Result Value Ref Range   Color, Urine YELLOW YELLOW   APPearance CLEAR CLEAR   Specific Gravity, Urine 1.025 1.005 - 1.030   pH 6.0 5.0 - 8.0   Glucose, UA >=500 (A) NEGATIVE mg/dL   Hgb urine dipstick LARGE (A) NEGATIVE   Bilirubin Urine NEGATIVE NEGATIVE   Ketones, ur NEGATIVE NEGATIVE mg/dL   Protein, ur NEGATIVE NEGATIVE mg/dL   Nitrite NEGATIVE NEGATIVE   Leukocytes,Ua TRACE (A) NEGATIVE    Comment: Performed at [facility] [address], [place], [place] [address]  Urinalysis, Microscopic (reflex)     Status: Abnormal   Collection Time: 05/17/21  3:33 PM  Result Value Ref Range   RBC / HPF >50 0 - 5 RBC/hpf   WBC, UA 11-20 0 - 5 WBC/hpf   Bacteria, UA RARE (A) NONE SEEN   Squamous Epithelial / LPF 11-20 0 - 5   Mucus PRESENT    Budding Yeast PRESENT    Ca Oxalate Crys, UA PRESENT     Comment: Performed at [facility] [address], [place], [place] [address]  CBC with Differential     Status: Abnormal   Collection Time: 05/17/21  3:40 PM  Result Value Ref Range   WBC 8.0 4.0 - 10.5 K/uL   RBC 4.39 4.22 - 5.81 MIL/uL   Hemoglobin 14.1 13.0 - 17.0 g/dL   HCT 43.4 39.0 - 52.0 %   MCV 98.9 80.0 - 100.0 fL   MCH 32.1 26.0 - 34.0 pg   MCHC 32.5 30.0 - 36.0 g/dL   RDW 13.3 11.5 - 15.5 %   Platelets 265 150 - 400 K/uL   nRBC 0.0 0.0 - 0.2 %   Neutrophils Relative % 66 %   Neutro Abs 5.3 1.7 - 7.7 K/uL   Lymphocytes Relative 17 %   Lymphs Abs 1.4 0.7 - 4.0 K/uL   Monocytes Relative 17 %   Monocytes Absolute 1.3 (H) 0.1 - 1.0 K/uL   Eosinophils Relative 0 %   Eosinophils Absolute 0.0 0.0 - 0.5 K/uL   Basophils Relative 0 %   Basophils Absolute 0.0 0.0 - 0.1 K/uL    Immature Granulocytes 0 %   Abs Immature Granulocytes 0.01 0.00 - 0.07 K/uL    Comment: Performed at [facility] [address] [place], [place] [address]  Comprehensive metabolic panel     Status: Abnormal   Collection Time: 05/17/21  3:40 PM  Result Value Ref Range   Sodium 138 135 - 145 mmol/L   Potassium 3.9 3.5 - 5.1 mmol/L   Chloride 102 98 - 111 mmol/L   CO2 27 22 - 32 mmol/L   Glucose,  Bld 113 (H) 70 - 99 mg/dL    Comment: Glucose reference range applies only to samples taken after fasting for at least 8 hours.   BUN 22 8 - 23 mg/dL   Creatinine, Ser 1.15 0.61 - 1.24 mg/dL   Calcium 8.7 (L) 8.9 - 10.3 mg/dL   Total Protein 7.2 6.5 - 8.1 g/dL   Albumin 3.2 (L) 3.5 - 5.0 g/dL   AST 45 (H) 15 - 41 U/L   ALT 31 0 - 44 U/L   Alkaline Phosphatase 105 38 - 126 U/L   Total Bilirubin 0.7 0.3 - 1.2 mg/dL   GFR, Estimated >60 >60 mL/min    Comment: (NOTE) Calculated using the CKD-EPI Creatinine Equation (2021)    Anion gap 9 5 - 15    Comment: Performed at [facility], [place] [address], [place], [place] [address]  Troponin I (High Sensitivity)     Status: Abnormal   Collection Time: 05/17/21  3:40 PM  Result Value Ref Range   Troponin I (High Sensitivity) 48 (H) <18 ng/L    Comment: (NOTE) Elevated high sensitivity troponin I (hsTnI) values and significant  changes across serial measurements may suggest ACS but many other  chronic and acute conditions are known to elevate hsTnI results.  Refer to the "Links" section for chest pain algorithms and additional  guidance. Performed at [facility], [place] [address], [place], [place] [address]   Ammonia     Status: None   Collection Time: 05/17/21  7:05 PM  Result Value Ref Range   Ammonia <10 9 - 35 umol/L    Comment: Performed at [facility], [place] [address] [place], [place] [address]  TSH     Status: None   Collection Time: 05/17/21  7:05 PM  Result Value Ref Range   TSH 1.315 0.350 - 4.500 uIU/mL     Comment: Performed by a 3rd Generation assay with a functional sensitivity of <=0.01 uIU/mL. Performed at [facility], [place] [address] [place], [place] [address]   Brain natriuretic peptide     Status: Abnormal   Collection Time: 05/17/21  7:05 PM  Result Value Ref Range   B Natriuretic Peptide 627.9 (H) 0.0 - 100.0 pg/mL    Comment: Performed at [facility] [address] [place], [place] [address]  Resp Panel by RT-PCR (Flu A&B, Covid) Nasopharyngeal Swab     Status: Abnormal   Collection Time: 05/17/21  8:08 PM   Specimen: Nasopharyngeal Swab; Nasopharyngeal(NP) swabs in vial transport medium  Result Value Ref Range   SARS Coronavirus 2 by RT PCR NEGATIVE NEGATIVE    Comment: (NOTE) SARS-CoV-2 target nucleic acids are NOT DETECTED.  The SARS-CoV-2 RNA is generally detectable in upper respiratory specimens during the acute phase of infection. The lowest concentration of SARS-CoV-2 viral copies this assay can detect is 138 copies/mL. A negative result does not preclude SARS-Cov-2 infection and should not be used as the sole basis for treatment or other patient management decisions. A negative result may occur with  improper specimen collection/handling, submission of specimen other than nasopharyngeal swab, presence of viral mutation(s) within the areas targeted by this assay, and inadequate number of viral copies(<138 copies/mL). A negative result must be combined with clinical observations, patient history, and epidemiological information. The expected result is Negative.  Fact Sheet for Patients:  EntrepreneurPulse.com.au  Fact Sheet for Healthcare Providers:  IncredibleEmployment.be  This test is no t yet approved  or cleared by the [place] and  has been authorized for detection and/or diagnosis of SARS-CoV-2 by FDA under an Emergency Use Authorization (EUA). This EUA will remain  in effect (meaning this test can  be used) for the duration of the COVID-19 declaration under Section 564(b)(1) of the Act, 21 U.S.C.section 360bbb-3(b)(1), unless the authorization is terminated  or revoked sooner.       Influenza A by PCR POSITIVE (A) NEGATIVE   Influenza B by PCR NEGATIVE NEGATIVE    Comment: (NOTE) The Xpert Xpress SARS-CoV-2/FLU/RSV plus assay is intended as an aid in the diagnosis of influenza from Nasopharyngeal swab specimens and should not be used as a sole basis for treatment. Nasal washings and aspirates are unacceptable for Xpert Xpress SARS-CoV-2/FLU/RSV testing.  Fact Sheet for Patients: EntrepreneurPulse.com.au  Fact Sheet for Healthcare Providers: IncredibleEmployment.be  This test is not yet approved or cleared by the [place] FDA and has been authorized for detection and/or diagnosis of SARS-CoV-2 by FDA under an Emergency Use Authorization (EUA). This EUA will remain in effect (meaning this test can be used) for the duration of the COVID-19 declaration under Section 564(b)(1) of the Act, 21 U.S.C. section 360bbb-3(b)(1), unless the authorization is terminated or revoked.  Performed at [facility], [place] [address] [place], [place] [address]   Comprehensive metabolic panel     Status: Abnormal   Collection Time: 05/18/21  2:14 AM  Result Value Ref Range   Sodium 135 135 - 145 mmol/L   Potassium 3.6 3.5 - 5.1 mmol/L   Chloride 102 98 - 111 mmol/L   CO2 24 22 - 32 mmol/L   Glucose, Bld 94 70 - 99 mg/dL    Comment: Glucose reference range applies only to samples taken after fasting for at least 8 hours.   BUN 23 8 - 23 mg/dL   Creatinine, Ser 0.96 0.61 - 1.24 mg/dL   Calcium 8.7 (L) 8.9 - 10.3 mg/dL   Total Protein 7.5 6.5 - 8.1 g/dL   Albumin 3.2 (L) 3.5 - 5.0 g/dL   AST 43 (H) 15 - 41 U/L   ALT 30 0 - 44 U/L   Alkaline Phosphatase 103 38 - 126 U/L   Total Bilirubin 0.8 0.3 - 1.2 mg/dL   GFR, Estimated >60 >60 mL/min     Comment: (NOTE) Calculated using the CKD-EPI Creatinine Equation (2021)    Anion gap 9 5 - 15    Comment: Performed at [facility], [place] [address] [place], [place] [address]  Magnesium     Status: None   Collection Time: 05/18/21  2:14 AM  Result Value Ref Range   Magnesium 2.2 1.7 - 2.4 mg/dL    Comment: Performed at [facility] [address] [place], [place] [address]  CBC WITH DIFFERENTIAL     Status: None   Collection Time: 05/18/21  2:14 AM  Result Value Ref Range   WBC 5.8 4.0 - 10.5 K/uL   RBC 4.70 4.22 - 5.81 MIL/uL   Hemoglobin 15.1 13.0 - 17.0 g/dL   HCT 46.3 39.0 - 52.0 %   MCV 98.5 80.0 - 100.0 fL   MCH 32.1 26.0 - 34.0 pg   MCHC 32.6 30.0 - 36.0 g/dL   RDW 13.3 11.5 - 15.5 %   Platelets 252 150 - 400 K/uL   nRBC 0.0 0.0 - 0.2 %   Neutrophils Relative % 57 %   Neutro Abs 3.3 1.7 -  7.7 K/uL   Lymphocytes Relative 23 %   Lymphs Abs 1.3 0.7 - 4.0 K/uL   Monocytes Relative 17 %   Monocytes Absolute 1.0 0.1 - 1.0 K/uL   Eosinophils Relative 2 %   Eosinophils Absolute 0.1 0.0 - 0.5 K/uL   Basophils Relative 1 %   Basophils Absolute 0.0 0.0 - 0.1 K/uL   Immature Granulocytes 0 %   Abs Immature Granulocytes 0.01 0.00 - 0.07 K/uL    Comment: Performed at [facility], [address], [place], [place] [address]  Troponin I (High Sensitivity)     Status: Abnormal   Collection Time: 05/18/21  2:14 AM  Result Value Ref Range   Troponin I (High Sensitivity) 51 (H) <18 ng/L    Comment: (NOTE) Elevated high sensitivity troponin I (hsTnI) values and significant  changes across serial measurements may suggest ACS but many other  chronic and acute conditions are known to elevate hsTnI results.  Refer to the "Links" section for chest pain algorithms and additional  guidance. Performed at [facility], [address], [place], [place] [address]   C-reactive protein     Status: None   Collection Time: 05/18/21  2:14 AM  Result Value Ref Range    CRP 0.7 <1.0 mg/dL    Comment: Performed at [facility], [address], [place], [place] [address]  Procalcitonin - Baseline     Status: None   Collection Time: 05/18/21  2:14 AM  Result Value Ref Range   Procalcitonin <0.10 ng/mL    Comment:        Interpretation: PCT (Procalcitonin) <= 0.5 ng/mL: Systemic infection (sepsis) is not likely. Local bacterial infection is possible. (NOTE)       Sepsis PCT Algorithm           Lower Respiratory Tract                                      Infection PCT Algorithm    ----------------------------     ----------------------------         PCT < 0.25 ng/mL                PCT < 0.10 ng/mL          Strongly encourage             Strongly discourage   discontinuation of antibiotics    initiation of antibiotics    ----------------------------     -----------------------------       PCT 0.25 - 0.50 ng/mL            PCT 0.10 - 0.25 ng/mL               OR       >80% decrease in PCT            Discourage initiation of                                            antibiotics      Encourage discontinuation           of antibiotics    ----------------------------     -----------------------------         PCT >= 0.50 ng/mL  PCT 0.26 - 0.50 ng/mL               AND        <80% decrease in PCT             Encourage initiation of                                             antibiotics       Encourage continuation           of antibiotics    ----------------------------     -----------------------------        PCT >= 0.50 ng/mL                  PCT > 0.50 ng/mL               AND         increase in PCT                  Strongly encourage                                      initiation of antibiotics    Strongly encourage escalation           of antibiotics                                     -----------------------------                                           PCT <= 0.25 ng/mL                                                 OR                                         > 80% decrease in PCT                                      Discontinue / Do not initiate                                             antibiotics  Performed at [facility], [address] [place], [place] [address]   Culture, blood (routine x 2)     Status: None (Preliminary result)   Collection Time: 05/18/21  2:26 AM   Specimen: BLOOD  Result Value Ref Range   Specimen Description BLOOD LEFT ANTECUBITAL    Special Requests      BOTTLES DRAWN AEROBIC AND ANAEROBIC Blood Culture adequate volume   Culture      NO  GROWTH < 12 HOURS Performed at [facility] [address], [place], [place] [address]    Report Status PENDING   Culture, blood (routine x 2)     Status: None (Preliminary result)   Collection Time: 05/18/21  2:31 AM   Specimen: BLOOD RIGHT HAND  Result Value Ref Range   Specimen Description BLOOD RIGHT HAND    Special Requests      BOTTLES DRAWN AEROBIC AND ANAEROBIC Blood Culture results may not be optimal due to an inadequate volume of blood received in culture bottles   Culture      NO GROWTH < 12 HOURS Performed at [facility] [address], [place],  [address]    Report Status PENDING    DG Chest 2 View  Result Date: 05/17/2021 CLINICAL DATA:  Weakness, fatigue EXAM: CHEST - 2 VIEW COMPARISON:  Chest radiograph 11/08/2018 FINDINGS: Postsurgical changes reflecting prior CABG are again noted. The heart is enlarged, unchanged. The mediastinal contours are stable. There is no focal consolidation or pulmonary edema. There is no pleural effusion or pneumothorax. There is no acute osseous abnormality. IMPRESSION: Unchanged cardiomegaly. No radiographic evidence of acute cardiopulmonary process. Electronically Signed   By: Valetta Mole M.D.   On: 05/17/2021 16:25   US RENAL  Result Date: 05/17/2021 CLINICAL DATA:  Urinary bladder stone. EXAM: RENAL / URINARY TRACT ULTRASOUND COMPLETE COMPARISON:  CT renal  05/02/2021 FINDINGS: Right Kidney: Renal measurements: 9.6 x 4 x 3.4 cm = volume: 70 mL. Echogenicity within normal limits. No mass or hydronephrosis visualized. Left Kidney: Renal measurements: 8.9 x 5.3 x 4 1 cm = volume: 103 mL. Echogenicity within normal limits. No mass or hydronephrosis visualized. Urinary bladder: There is a 1.9 cm calcified stone within the urinary bladder lumen. Otherwise appears normal for degree of bladder distention. Other: Hepatic cystic lesions incidentally noted as seen on CT renal 05/02/2021. IMPRESSION: 1.  A 1.9 cm calcified stone within the urinary bladder lumen. 2. Otherwise unremarkable renal ultrasound. Electronically Signed   By: Iven Finn M.D.   On: 05/17/2021 22:06    Pending Labs Unresulted Labs (From admission, onward)     Start     Ordered   05/19/21 [id number]  Basic metabolic panel  Daily,   R     Question:  Specimen collection method  Answer:  Lab=Lab collect   05/18/21 0838   05/19/21 0500  CBC  Daily,   R     Question:  Specimen collection method  Answer:  Lab=Lab collect   05/18/21 0838   05/19/21 0500  Magnesium  Daily,   R     Question:  Specimen collection method  Answer:  Lab=Lab collect   05/18/21 0838   05/18/21 0500  Magnesium  Once-Timed,   TIMED        05/18/21 0220   05/18/21 0500  Vitamin B12  Tomorrow morning,   R        05/18/21 0229   05/17/21 1836  Urine Culture  Once,   STAT       Question:  Indication  Answer:  Urgency/frequency   05/17/21 1837            Vitals/Pain Today's Vitals   05/18/21 0705 05/18/21 0715 05/18/21 1000 05/18/21 1300  BP:  121/83 (!) 139/118 (!) 143/89  Pulse:  (!) 53 (!) 101 78  Resp:  (!) 22 (!) 25 (!) 26  Temp: 99.1 F (37.3 C)     TempSrc:  Oral     SpO2:  97% 98% 95%  Weight:      Height:      PainSc:        Isolation Precautions No active isolations  Medications Medications  rosuvastatin (CRESTOR) tablet 20 mg (20 mg Oral Given 05/18/21 0958)  lisinopril (ZESTRIL) tablet 10  mg (10 mg Oral Given 05/18/21 0931)  metoprolol tartrate (LOPRESSOR) tablet 25 mg (25 mg Oral Given 05/18/21 0930)  apixaban (ELIQUIS) tablet 5 mg (5 mg Oral Given 05/18/21 0930)  acetaminophen (TYLENOL) tablet 650 mg (has no administration in time range)    Or  acetaminophen (TYLENOL) suppository 650 mg (has no administration in time range)  polyethylene glycol (MIRALAX / GLYCOLAX) packet 17 g (has no administration in time range)  ondansetron (ZOFRAN) tablet 4 mg (has no administration in time range)    Or  ondansetron (ZOFRAN) injection 4 mg (has no administration in time range)  oseltamivir (TAMIFLU) capsule 30 mg (30 mg Oral Given 05/18/21 0930)  diltiazem (CARDIZEM CD) 24 hr capsule 120 mg (120 mg Oral Given 05/18/21 0819)  dofetilide (TIKOSYN) capsule 125 mcg (125 mcg Oral Given 05/18/21 0819)  cephALEXin (KEFLEX) capsule 500 mg (500 mg Oral Given 05/18/21 1315)  ipratropium-albuterol (DUONEB) 0.5-2.5 (3) MG/3ML nebulizer solution 3 mL (has no administration in time range)  oxyCODONE (Oxy IR/ROXICODONE) immediate release tablet 5 mg (has no administration in time range)  senna-docusate (Senokot-S) tablet 1 tablet (has no administration in time range)  traZODone (DESYREL) tablet 50 mg (has no administration in time range)  metoprolol tartrate (LOPRESSOR) injection 5 mg (has no administration in time range)  hydrALAZINE (APRESOLINE) injection 10 mg (has no administration in time range)  lactated ringers bolus 500 mL (0 mLs Intravenous Stopped 05/18/21 0311)    Followed by  lactated ringers infusion ( Intravenous Rate/Dose Change 05/18/21 0844)  arformoterol (BROVANA) nebulizer solution 15 mcg (has no administration in time range)  revefenacin (YUPELRI) nebulizer solution 175 mcg (175 mcg Nebulization Not Given 05/18/21 1400)  cefTRIAXone (ROCEPHIN) 1 g in sodium chloride 0.9 % 100 mL IVPB (0 g Intravenous Stopped 05/17/21 2314)  potassium chloride (KLOR-CON M) CR tablet 20 mEq (20 mEq  Oral Given 05/18/21 0238)  metoprolol tartrate (LOPRESSOR) injection 5 mg (5 mg Intravenous Given 05/18/21 0426)  potassium chloride SA (KLOR-CON M) CR tablet 40 mEq (40 mEq Oral Given 05/18/21 0930)    Mobility non-ambulatory High fall risk   Focused Assessments Cardiac Assessment Handoff:  Cardiac Rhythm: (S) Atrial fibrillation Lab Results  Component Value Date   CKTOTAL 37 (L) 05/02/2021   No results found for: DDIMER Does the Patient currently have chest pain? No   , Neuro Assessment Handoff:  Swallow screen pass? Yes  Cardiac Rhythm: (S) Atrial fibrillation       Neuro Assessment: Exceptions to [facility] Neuro Checks:      Last Documented NIHSS Modified Score:   Has TPA been given? No If patient is a Neuro Trauma and patient is going to OR before floor call report to [place] nurse: [phone] or [phone]  , Pulmonary Assessment Handoff:  Lung sounds:   O2 Device: Room Air      R Recommendations: See Admitting Provider Note  Report given to:   Additional Notes: none

## 2021-05-18 NOTE — ED Notes (Signed)
Family out of the room requesting for the provider to come see the pt

## 2021-05-18 NOTE — Assessment & Plan Note (Signed)
·   Patient presenting with 2-day history of increasing lethargy weakness and difficulty with ambulation  Felt to be secondary to underlying influenza with superimposed dehydration  Treating underlying influenza with Tamiflu  Additionally hydrating patient gently with intravenous isotonic fluids with temporarily holding diuretics  While emergency department provider believed patient may have a UTI, I do not feel that the urinalysis is particularly compelling for urinary tract infection  Ammonia, TSH unremarkable  No significant electrolyte derangements noted

## 2021-05-18 NOTE — Assessment & Plan Note (Signed)
·   Known bladder stone for which patient is following with Dr. Annabell Howells with urology  To undergo stone removal today (12/20) presented to the hospital instead  Will need to follow-up again with urology at time of discharge.

## 2021-05-18 NOTE — ED Notes (Signed)
Pt was given incentive spirometer.  

## 2021-05-18 NOTE — ED Notes (Signed)
Pt cleaned, changed, and adjusted in bed at this time

## 2021-05-18 NOTE — Assessment & Plan Note (Addendum)
·   Patient presenting with 2-day history of worsening cough generalized weakness, confusion and difficulty with ambulation  Patient's been found to be influenza A+ via PCR which is felt to most likely be the culprit of the patient's symptoms  Additionally, patient is exhibiting intermittent mild hypoxia in the emergency department  Initiating Tamiflu 75 mg twice daily  Supplemental oxygen for bouts of hypoxia  Gentle intravenous hydration and temporarily holding diuretic for concurrent volume depletion  Chest x-ray does not reveal a definitive pneumonia but will obtain procalcitonin and if this is elevated will obtain CT imaging of the chest to identify any early infiltrates most on chest x-ray

## 2021-05-18 NOTE — Assessment & Plan Note (Signed)
.   Continuing home regimen of lipid lowering therapy.  

## 2021-05-18 NOTE — Assessment & Plan Note (Signed)
•   No clinical evidence of cardiogenic volume overload  Temporarily holding home regimen of diuretics due to concern for volume depletion

## 2021-05-18 NOTE — Assessment & Plan Note (Signed)
•   Patient presenting with chronic atrial fibrillation with rapid ventricular response  Rate control likely exacerbated by underlying infection and volume depletion  Resuming home regimen of decreasing, metoprolol diltiazem  Obtaining magnesium  Continuing home regimen of Eliquis  Monitoring patient on telemetry  TSH within normal limits

## 2021-05-18 NOTE — ED Notes (Signed)
Per visitor pt pulled his pants off and she believed he had tinkled everywhere

## 2021-05-18 NOTE — H&P (Signed)
History and Physical    Edgar Thomas [id number] DOB: 06-11-1929 DOA: 05/17/2021  PCP: [name], MD  Patient coming from: Home   Chief Complaint:  Chief Complaint  Patient presents with   Weakness   Fatigue     HPI:    87 year old male with past medical history of coronary artery disease status post CABG (0000000), systolic and diastolic congestive heart failure (Echo 05/04/2021 EF 45-50%), chronic atrial fibrillation (on eliquis), hypertension, hyperlipidemia, gastroesophageal reflux disease, previous stroke who presents to [facility] emergency department weakness lethargy and confusion.  Of note, patient was recently hospitalized at [facility] regional hospital from 12/6 until 12/13.  Patient was treated for rapid atrial fibrillation as well as suspected infection although exact source of infection was never identified.  Patient received 4 days of intravenous antibiotics and rate controlling agents were titrated the patient eventually being discharged on a regimen of diltiazem 120 mg daily, metoprolol 50 mg twice daily (which was reduced to 25BID by the PCP a few days ago) and dofetilide 125 mg twice daily.  Patient was intermittently confused during that hospitalization with mention that patient should pursue dementia work-up as an outpatient.  Patient is a poor historian due to ongoing confusion.  Majority of history is been obtained with the daughter at the bedside as well as discussion with the emergency department staff.    Daughter explains that for the past 2 days the patient has been exhibiting increasingly worsening cough.  Cough is nonproductive.  There is no associated shortness of breath fever or chest pain.  As the patient's cough is worsened patient has exhibited progressively worsening lethargy generalized malaise and confusion.  Patient's weakness has become so severe that the patient has developed difficulty with ambulation as of 12/20.  Due to  patient's progressively worsening symptoms the patient was eventually brought into [facility] emergency department for evaluation.  Upon evaluation in the emergency department patient was found to be positive for influenza A via PCR testing.  Patient was found to exhibit intermittent mild hypoxia throughout the emergency department stay.  Patient was additionally identified to have a large bladder stone in the bladder.  ER provider was concerned for urinary tract infection and therefore administered 1 dose of intravenous ceftriaxone.  The hospitalist group was then called to assess the patient for admission to the hospital.      Review of Systems:   Review of Systems  Unable to perform ROS: Mental status change   Past Medical History:  Diagnosis Date   A-fib ([place])    Anemia    CAD (coronary artery disease)    Constipation    GERD (gastroesophageal reflux disease)    Heart attack ([place])    Heartburn    Hyperlipemia    Hypertension    Post herpetic neuralgia    Stroke (cerebrum) ([place]) 11/04/2020   Left lenticulostriate and external capsule   Stroke ([place])    TIA (transient ischemic attack)    Vitamin D deficiency     Past Surgical History:  Procedure Laterality Date   balloon angioplasty of LAD     CARDIOVERSION  2018   a fib   CATARACT EXTRACTION, BILATERAL  1019, 2022   COLONOSCOPY     CORONARY ARTERY BYPASS GRAFT N/A 12/03/2013   Procedure: CORONARY ARTERY BYPASS GRAFTING (CABG) x4 using left internal mammary artery and right greater saphenous vein. LIMA to LAD, sequential SVG to OM 1 & OM 2, SVG to PD;  Surgeon:  Grace Isaac, MD;  Location: [place];  Service: Open Heart Surgery;  Laterality: N/A;   ENDOVEIN HARVEST OF GREATER SAPHENOUS VEIN Right 12/03/2013   Procedure: ENDOVEIN HARVEST OF GREATER SAPHENOUS VEIN;  Surgeon: Grace Isaac, MD;  Location: [place];  Service: Open Heart Surgery;  Laterality: Right;   INTRAOPERATIVE TRANSESOPHAGEAL ECHOCARDIOGRAM N/A  12/03/2013   Procedure: INTRAOPERATIVE TRANSESOPHAGEAL ECHOCARDIOGRAM;  Surgeon: Grace Isaac, MD;  Location: [place];  Service: Open Heart Surgery;  Laterality: N/A;     reports that he has never smoked. He has never used smokeless tobacco. He reports that he does not drink alcohol and does not use drugs.  Allergies  Allergen Reactions   Contrast Media [Iodinated Diagnostic Agents] Rash    19 years ago at [facility]   Metrizamide Rash    19 years ago at [facility] (Amipaque)   Other Other (See Comments)    Patient preference: NO MEAT OR DAIRY!!   Amiodarone Other (See Comments)    Makes the patient walk sideways and he falls   Ciprofloxacin Other (See Comments)    Was told by MD to "not take"   Tramadol Nausea And Vomiting    Family History  Problem Relation Age of Onset   Heart disease Mother        died at age 2 of heart attack   Leukemia Sister    Heart disease Brother    Hypertension Brother      Prior to Admission medications   Medication Sig Start Date End Date Taking? Authorizing Provider  apixaban (ELIQUIS) 5 MG TABS tablet Take 5 mg by mouth 2 (two) times daily.    [provider]  Cholecalciferol (VITAMIN D-3) 25 MCG (1000 UT) CAPS Take 1,000 Units by mouth daily after breakfast.    [provider]  CRESTOR 20 MG tablet Take 20 mg by mouth every morning.  08/26/14   [provider]  diltiazem (CARDIZEM CD) 120 MG 24 hr capsule Take 120 mg by mouth every morning. 05/10/21   [provider]  dofetilide (TIKOSYN) 125 MCG capsule Take 125 mcg by mouth 2 (two) times daily. 05/20/18   [provider]  ferrous sulfate 325 (65 FE) MG tablet Take 325 mg by mouth daily with breakfast.    [provider]  furosemide (LASIX) 40 MG tablet Take 40 mg by mouth 2 (two) times daily. 05/10/21   [provider]  gabapentin (NEURONTIN) 100 MG capsule Take by mouth. 05/16/21   [provider]  guaiFENesin (MUCINEX) 600 MG 12 hr tablet Take 2 tablets (1,200 mg total) by mouth 2 (two) times daily. Patient taking differently: Take 1,200 mg by mouth 2 (two) times daily as needed for cough. 08/30/15   Rai, Ripudeep K, MD  JARDIANCE 10 MG TABS tablet Take 10 mg by mouth every morning. 05/10/21   [provider]  lisinopril (ZESTRIL) 10 MG tablet Take 10 mg by mouth daily.    [provider]  metoprolol tartrate (LOPRESSOR) 25 MG tablet Take 0.5 tablets (12.5 mg total) by mouth 2 (two) times daily. Patient taking differently: Take 25 mg by mouth 2 (two) times daily. 11/12/18   Matilde Haymaker, MD  metoprolol tartrate (LOPRESSOR) 50 MG tablet Take 50 mg by mouth 2 (two) times daily. 05/10/21   [provider]  Multiple Vitamins-Minerals (ONE-A-DAY MENS 50+ ADVANTAGE) TABS Take 1 tablet by mouth daily with breakfast.     [provider]  multivitamin-lutein (OCUVITE-LUTEIN) CAPS capsule Take 1 capsule by mouth daily with breakfast.     [provider]  nitroGLYCERIN (NITROSTAT) 0.4 MG SL tablet Place 0.4 mg under the tongue every 5 (five) minutes as needed for chest pain.    [provider]  Omega 3 1000 MG CAPS Take 2,000 mg by mouth daily with breakfast.    [provider]  OVER THE COUNTER MEDICATION Take 2 capsules by mouth in the morning and at bedtime. Balance of Nature    [provider]  polyethylene glycol (MIRALAX / GLYCOLAX) packet Take 17 g by mouth daily as needed for moderate constipation. 08/30/15   Rai, Ripudeep K, MD  polyethylene glycol powder (GLYCOLAX/MIRALAX) 17 GM/SCOOP powder Take 17 g by mouth daily. 04/18/21   [provider]    Physical Exam: Vitals:   05/17/21 2345 05/18/21 0030 05/18/21 0115 05/18/21 0200  BP: (!) 144/101 137/65 (!) 148/88 (!) 153/88  Pulse: (!) 49 99 (!) 104 (!) 106  Resp: 20 (!) 28 (!) 30   Temp:      TempSrc:      SpO2: 99% 97% 95% 95%  Weight:       Height:        Constitutional: Lethargic but arousable oriented x 2.  no associated distress.   Skin: no rashes, no lesions, poor skin turgor noted. Eyes: Pupils are equally reactive to light.  No evidence of scleral icterus or conjunctival pallor.  ENMT: Dry mucous membranes noted.  Posterior pharynx clear of any exudate or lesions.   Neck: normal, supple, no masses, no thyromegaly.  No evidence of jugular venous distension.   Respiratory: Bibasilar rales with interimttent expiratory wheezing.  No accessory muscle use.  Cardiovascular: Tachycardic rate with irregularly irregular rhythm, no murmurs / rubs / gallops. No extremity edema. 2+ pedal pulses. No carotid bruits.  Chest:   Nontender without crepitus or deformity.   Back:   Nontender without crepitus or deformity. Abdomen: Abdomen is soft and nontender.  No evidence of intra-abdominal masses.  Positive bowel sounds noted in all quadrants.   Musculoskeletal: No joint deformity upper and lower extremities. Good ROM, no contractures. Normal muscle tone.  Neurologic: CN 2-12 grossly intact. Sensation intact.  Patient moving all 4 extremities spontaneously.  Patient is following all commands.  Patient is responsive to verbal stimuli.   Psychiatric: Unable to fully assess due to lethargy.  Patient does not seem to possess insight as to current situation.    Labs on Admission: I have personally reviewed following labs and imaging studies -   CBC: Recent Labs  Lab 05/17/21 1540  WBC 8.0  NEUTROABS 5.3  HGB 14.1  HCT 43.4  MCV 98.9  PLT 99991111   Basic Metabolic Panel: Recent Labs  Lab 05/17/21 1540  NA 138  K 3.9  CL 102  CO2 27  GLUCOSE 113*  BUN 22  CREATININE 1.15  CALCIUM 8.7*   GFR: Estimated Creatinine Clearance: 40.5 mL/min (by C-G formula based on SCr of 1.15 mg/dL). Liver Function Tests: Recent Labs  Lab 05/17/21 1540  AST 45*  ALT 31  ALKPHOS 105  BILITOT 0.7  PROT 7.2  ALBUMIN 3.2*   No results for  input(s): LIPASE, AMYLASE in the last 168 hours. Recent Labs  Lab 05/17/21 1905  AMMONIA <10   Coagulation Profile: No results for input(s): INR, PROTIME in the last 168 hours. Cardiac Enzymes: No results for input(s): CKTOTAL, CKMB, CKMBINDEX, TROPONINI in the last 168 hours.  BNP (last 3 results) No results for input(s): PROBNP in the last 8760 hours. HbA1C: No results for input(s): HGBA1C in the last 72 hours. CBG: No results for input(s): GLUCAP in the last 168 hours. Lipid Profile: No results for input(s): CHOL, HDL, LDLCALC, TRIG, CHOLHDL, LDLDIRECT in the last 72 hours. Thyroid Function Tests: Recent Labs    05/17/21 1905  TSH 1.315   Anemia Panel: No results for input(s): VITAMINB12, FOLATE, FERRITIN, TIBC, IRON, RETICCTPCT in the last 72 hours. Urine analysis:    Component Value Date/Time   COLORURINE YELLOW 05/17/2021 1533   APPEARANCEUR CLEAR 05/17/2021 1533   LABSPEC 1.025 05/17/2021 1533   PHURINE 6.0 05/17/2021 1533   GLUCOSEU >=500 (A) 05/17/2021 1533   HGBUR LARGE (A) 05/17/2021 1533   BILIRUBINUR NEGATIVE 05/17/2021 1533   KETONESUR NEGATIVE 05/17/2021 1533   PROTEINUR NEGATIVE 05/17/2021 1533   UROBILINOGEN 0.2 12/01/2013 1410   NITRITE NEGATIVE 05/17/2021 1533   LEUKOCYTESUR TRACE (A) 05/17/2021 1533    Radiological Exams on Admission - Personally Reviewed: DG Chest 2 View  Result Date: 05/17/2021 CLINICAL DATA:  Weakness, fatigue EXAM: CHEST - 2 VIEW COMPARISON:  Chest radiograph 11/08/2018 FINDINGS: Postsurgical changes reflecting prior CABG are again noted. The heart is enlarged, unchanged. The mediastinal contours are stable. There is no focal consolidation or pulmonary edema. There is no pleural effusion or pneumothorax. There is no acute osseous abnormality. IMPRESSION: Unchanged cardiomegaly. No radiographic evidence of acute cardiopulmonary process. Electronically Signed   By: Lesia Hausen M.D.   On: 05/17/2021 16:25   US RENAL  Result  Date: 05/17/2021 CLINICAL DATA:  Urinary bladder stone. EXAM: RENAL / URINARY TRACT ULTRASOUND COMPLETE COMPARISON:  CT renal 05/02/2021 FINDINGS: Right Kidney: Renal measurements: 9.6 x 4 x 3.4 cm = volume: 70 mL. Echogenicity within normal limits. No mass or hydronephrosis visualized. Left Kidney: Renal measurements: 8.9 x 5.3 x 4 1 cm = volume: 103 mL. Echogenicity within normal limits. No mass or hydronephrosis visualized. Urinary bladder: There is a 1.9 cm calcified stone within the urinary bladder lumen. Otherwise appears normal for degree of bladder distention. Other: Hepatic cystic lesions incidentally noted as seen on CT renal 05/02/2021. IMPRESSION: 1.  A 1.9 cm calcified stone within the urinary bladder lumen. 2. Otherwise unremarkable renal ultrasound. Electronically Signed   By: Tish Frederickson M.D.   On: 05/17/2021 22:06    EKG: Personally reviewed.  Rhythm is atrial fibrillation at 88bpm with ST segment elevation in precordial leads that is unchanged from prior ECG's.    Assessment/Plan  * Influenza A Patient presenting with 2-day history of worsening cough generalized weakness, confusion and difficulty with ambulation Patient's been found to be influenza A+ via PCR which is felt to most likely be the culprit of the patient's symptoms Additionally, patient is exhibiting intermittent mild hypoxia in the emergency department Initiating Tamiflu 75 mg twice daily Supplemental oxygen for bouts of hypoxia Gentle intravenous hydration and temporarily holding diuretic for concurrent volume depletion Chest x-ray does not reveal a definitive pneumonia but will obtain procalcitonin and if this is elevated will obtain CT imaging of the chest to identify any early infiltrates most on chest x-ray  Encephalopathy due to novel influenza A Patient presenting with 2-day history of increasing lethargy weakness and difficulty with ambulation Felt to be secondary to underlying influenza with superimposed  dehydration Treating underlying influenza with Tamiflu Additionally hydrating patient gently with intravenous isotonic fluids with temporarily holding diuretics While emergency department provider believed patient may have  a UTI, I do not feel that the urinalysis is particularly compelling for urinary tract infection Ammonia, TSH unremarkable No significant electrolyte derangements noted  Chronic atrial fibrillation with rapid ventricular response ([place]) Patient presenting with chronic atrial fibrillation with rapid ventricular response Rate control likely exacerbated by underlying infection and volume depletion Resuming home regimen of decreasing, metoprolol diltiazem Obtaining magnesium Continuing home regimen of Eliquis Monitoring patient on telemetry TSH within normal limits   Chronic combined systolic (congestive) and diastolic (congestive) heart failure (HCC) No clinical evidence of cardiogenic volume overload Temporarily holding home regimen of diuretics due to concern for volume depletion  Essential hypertension Daughter reports that patient has become increasingly hypotensive as of late prompting his primary care provider to reduce his home regimen of Lasix and metoprolol. Will attempt to resume home regimen of antihypertensives while monitoring blood pressures closely PRN intravenous antihypertensives for excessively elevated blood pressure    Bladder stone Known bladder stone for which patient is following with Dr. Jeffie Pollock with urology To undergo stone removal today (12/20) presented to the hospital instead Will need to follow-up again with urology at time of discharge.  Coronary artery disease involving native coronary artery of native heart without angina pectoris Patient is currently chest pain free Monitoring patient on telemetry Continue home regimen of anticoagulation, lipid lowering therapy and AV nodal blocking therapy PRN Nitroglycerin   Mixed  hyperlipidemia Continuing home regimen of lipid lowering therapy.       Code Status:  DNR  code status decision has been confirmed with: daughter Family Communication: Discussed plan of care With daughter who is at the bedside.  Status is: Inpatient  Remains inpatient appropriate because:   Influenza A infection complicated by acute metabolic encephalopathy required treatment with Tamiflu, hydration with intravenous fluids, supplemental oxygen, as needed bronchodilator therapy as well as initiation and management of AV nodal blocking agents for atrial fibrillation.        Vernelle Emerald MD [facility] Pager [phone]  If 7PM-7AM, please contact night-coverage [email or web address] Use universal [place] password for that web site. If you do not have the password, please call the hospital operator.  05/18/2021, 2:33 AM

## 2021-05-18 NOTE — Assessment & Plan Note (Signed)
•   Daughter reports that patient has become increasingly hypotensive as of late prompting his primary care provider to reduce his home regimen of Lasix and metoprolol.  Will attempt to resume home regimen of antihypertensives while monitoring blood pressures closely  PRN intravenous antihypertensives for excessively elevated blood pressure

## 2021-05-18 NOTE — Progress Notes (Signed)
°  Transition of Care [facility]) Screening Note   Patient Details  Name: Edgar Thomas Date of Birth: Jun 11, 1929   [facility] of [facility]) CM/SW Contact:    Baldemar Lenis, LCSW Phone Number: 05/18/2021, 3:37 PM    Transition of Care Department [facility]) has reviewed patient. We will continue to monitor patient advancement through interdisciplinary progression rounds. If new patient transition needs arise, please place a TOC consult.

## 2021-05-18 NOTE — ED Notes (Signed)
Pt had removed pulse ox and ecg cords and his gown at this time. Family remains at bedside. Pt placed back on monitor and pulse ox with gown placed back on at this time

## 2021-05-18 NOTE — Progress Notes (Signed)
87 year old male with history of CAD status post CABG, systolic CHF EF 45%, chronic A. fib on Eliquis, HTN, HLD, GERD, previous CVA admitted for lethargy and confusion.  He was found to have influenza A pneumonia, dehydration complicated by atrial fibrillation with RVR.   Seen and examined at bedside, sitting up in bed.  Alert to name and place.  Wife is also present at bedside during my visit.  On physical exam vital signs stable Not in acute distress some bilateral rhonchi noted.  Irregularly irregular rhythm.  Abdomen is nontender nondistended  Assessment and plan-  Acute metabolic encephalopathy, improved - Secondary to underlying infection and dehydration.  Supportive care.  Ammonia and TSH levels are unremarkable.  Influenza A infection - 2-day history of this.  Started on Tamiflu - Gentle hydration, bronchodilators.  I-S/flutter valve. - Procalcitonin-neg  Chronic atrial fibrillation with RVR - Suspect from underlying infection and dehydration - Continue Eliquis.  Rate control drugs metoprolol, Cardizem, Tikosyn  Combined CHF, EF 45% - Appears to be slightly dehydrated therefore getting IV fluids  Essential hypertension - On metoprolol, lisinopril, Cardizem  Bladder stone Mild dysuria - Currently not causing an issue.  Follows outpatient Dr. Annabell Howells, he can follow this up outpatient upon discharge -UA shows mild UTI therefore will treat with oral Keflex  CAD status post CABG - Currently chest pain-free.  Hyperlipidemia - Statin  Wife present at bedside  Discussed with RN  Stephania Fragmin MD [facility]

## 2021-05-19 ENCOUNTER — Other Ambulatory Visit (HOSPITAL_COMMUNITY): Payer: Self-pay

## 2021-05-19 DIAGNOSIS — N210 Calculus in bladder: Secondary | ICD-10-CM | POA: Diagnosis not present

## 2021-05-19 DIAGNOSIS — J101 Influenza due to other identified influenza virus with other respiratory manifestations: Secondary | ICD-10-CM | POA: Diagnosis not present

## 2021-05-19 DIAGNOSIS — R531 Weakness: Secondary | ICD-10-CM | POA: Diagnosis not present

## 2021-05-19 DIAGNOSIS — J1000 Influenza due to other identified influenza virus with unspecified type of pneumonia: Secondary | ICD-10-CM | POA: Diagnosis not present

## 2021-05-19 LAB — BASIC METABOLIC PANEL
Anion gap: 7 (ref 5–15)
BUN: 18 mg/dL (ref 8–23)
CO2: 24 mmol/L (ref 22–32)
Calcium: 8.2 mg/dL — ABNORMAL LOW (ref 8.9–10.3)
Chloride: 103 mmol/L (ref 98–111)
Creatinine, Ser: 0.84 mg/dL (ref 0.61–1.24)
GFR, Estimated: >60 mL/min (ref >60)
Glucose, Bld: 88 mg/dL (ref 70–99)
Potassium: 3.7 mmol/L (ref 3.5–5.1)
Sodium: 134 mmol/L — ABNORMAL LOW (ref 135–145)

## 2021-05-19 LAB — CBC
HCT: 41.5 % (ref 39.0–52.0)
Hemoglobin: 13.7 g/dL (ref 13.0–17.0)
MCH: 31.9 pg (ref 26.0–34.0)
MCHC: 33 g/dL (ref 30.0–36.0)
MCV: 96.5 fL (ref 80.0–100.0)
Platelets: 215 10*3/uL (ref 150–400)
RBC: 4.3 MIL/uL (ref 4.22–5.81)
RDW: 13.5 % (ref 11.5–15.5)
WBC: 3.9 10*3/uL — ABNORMAL LOW (ref 4.0–10.5)
nRBC: 0 % (ref 0.0–0.2)

## 2021-05-19 LAB — URINE CULTURE

## 2021-05-19 LAB — MAGNESIUM: Magnesium: 1.9 mg/dL (ref 1.7–2.4)

## 2021-05-19 MED ORDER — OSELTAMIVIR PHOSPHATE 30 MG PO CAPS
30.0000 mg | ORAL_CAPSULE | Freq: Two times a day (BID) | ORAL | 0 refills | Status: DC
  Filled 2021-05-19: qty 8, 4d supply, fill #0

## 2021-05-19 MED ORDER — CEPHALEXIN 500 MG PO CAPS
500.0000 mg | ORAL_CAPSULE | Freq: Three times a day (TID) | ORAL | 0 refills | Status: DC
  Filled 2021-05-19: qty 12, 4d supply, fill #0

## 2021-05-19 MED ORDER — METOPROLOL TARTRATE 25 MG PO TABS
37.5000 mg | ORAL_TABLET | Freq: Two times a day (BID) | ORAL | Status: DC
  Administered 2021-05-19: 10:00:00 37.5 mg via ORAL
  Filled 2021-05-19: qty 1

## 2021-05-19 MED ORDER — METOPROLOL TARTRATE 25 MG PO TABS
37.5000 mg | ORAL_TABLET | Freq: Two times a day (BID) | ORAL | 0 refills | Status: DC
  Filled 2021-05-19: qty 90, 30d supply, fill #0

## 2021-05-19 MED ORDER — POTASSIUM CHLORIDE CRYS ER 20 MEQ PO TBCR
40.0000 meq | EXTENDED_RELEASE_TABLET | Freq: Once | ORAL | Status: AC
  Administered 2021-05-19: 10:00:00 40 meq via ORAL
  Filled 2021-05-19: qty 2

## 2021-05-19 NOTE — Plan of Care (Signed)
  Problem: Acute Rehab OT Goals (only OT should resolve) Goal: Pt. Will Perform Grooming Outcome: Adequate for Discharge Goal: Pt. Will Perform Lower Body Bathing Outcome: Adequate for Discharge Goal: Pt. Will Perform Lower Body Dressing Outcome: Adequate for Discharge Goal: Pt. Will Transfer To Toilet Outcome: Adequate for Discharge Goal: Pt. Will Perform Toileting-Clothing Manipulation Outcome: Adequate for Discharge   

## 2021-05-19 NOTE — Evaluation (Signed)
Physical Therapy Evaluation Patient Details Name: Edgar Thomas MRN: [id number] DOB: 04-18-1930 Today's Date: 05/19/2021  History of Present Illness  87 y.o. M admitted on 12/20 due to weakness, lethargy, and confusion. Pt found to have influenza A. PMH significant for coronary artery disease status post CABG (06/7251), systolic and diastolic congestive heart failure (Echo 05/04/2021 EF 45-50%), chronic atrial fibrillation (on eliquis), hypertension, hyperlipidemia, gastroesophageal reflux disease, and stroke.   Clinical Impression  PT eval complete. Pt required mod assist bed mobility, mod assist sit to stand, min assist SPT, and min assist amb 25' with RW. Wife present in room during eval and confirms ability to provide needed level of assist at home.  Pt fatigues quickly. Resting HR in 90s and sustained in 140s during activity. SpO2 in 90s on RA. Pt in recliner at end of session. Plan is for d/c home today. All further therapy needs to be addressed by home health. Recommend a rollator for home.     Recommendations for follow up therapy are one component of a multi-disciplinary discharge planning process, led by the attending physician.  Recommendations may be updated based on patient status, additional functional criteria and insurance authorization.  Follow Up Recommendations Home health PT    Assistance Recommended at Discharge Frequent or constant Supervision/Assistance  Functional Status Assessment Patient has had a recent decline in their functional status and demonstrates the ability to make significant improvements in function in a reasonable and predictable amount of time.   Equipment Recommendations  Rollator (4 wheels)    Recommendations for Other Services       Precautions / Restrictions Precautions Precautions: Fall;Other (comment) Precaution Comments: watch HR Restrictions Weight Bearing Restrictions: No      Mobility  Bed Mobility Overal bed mobility: Needs  Assistance Bed Mobility: Supine to Sit     Supine to sit: Mod assist;HOB elevated     General bed mobility comments: +rail, increased time    Transfers Overall transfer level: Needs assistance Equipment used: Ambulation equipment used Transfers: Sit to/from Stand;Bed to chair/wheelchair/BSC Sit to Stand: Mod assist Stand pivot transfers: Min assist         General transfer comment: assist to power up and stabilize balance    Ambulation/Gait Ambulation/Gait assistance: Min assist Gait Distance (Feet): 25 Feet Assistive device: Rolling walker (2 wheels) Gait Pattern/deviations: Step-through pattern;Decreased stride length Gait velocity: decreased Gait velocity interpretation: <1.8 ft/sec, indicate of risk for recurrent falls   General Gait Details: assist to maintain balance and manage RW. HR sustained in 140s during minimal activity.  Stairs Stairs:  (Discussed front entry stairs with wife. She reports no concerns getting pt up stairs upon d/c.)          Wheelchair Mobility    Modified Rankin (Stroke Patients Only)       Balance Overall balance assessment: Needs assistance Sitting-balance support: Feet supported;No upper extremity supported Sitting balance-Leahy Scale: Fair     Standing balance support: Single extremity supported;Bilateral upper extremity supported;During functional activity Standing balance-Leahy Scale: Poor Standing balance comment: reliant on external support                             Pertinent Vitals/Pain Pain Assessment: Faces Pain Score: 0-No pain    Home Living Family/patient expects to be discharged to:: Private residence Living Arrangements: Spouse/significant other Available Help at Discharge: Family;Available 24 hours/day Type of Home: House Home Access: Stairs to enter Entrance Stairs-Rails: Right Entrance Stairs-Number of  Steps: 6 Alternate Level Stairs-Number of Steps: 2 steps to bed room, 2 steps to  living room Home Layout: Two level Home Equipment: Rolling Walker (2 wheels);BSC/3in1;Shower seat - built in;Grab bars - toilet;Grab bars - tub/shower;Hand held shower head      Prior Function Prior Level of Function : Needs assist       Physical Assist : Mobility (physical);ADLs (physical) Mobility (physical): Gait;Transfers   Mobility Comments: wife assists with ambulation household distances. Pt refuses RW. Wife holds onto pt's belt to provide support. ADLs Comments: Wife assists with all ADLs.     Hand Dominance   Dominant Hand: Right    Extremity/Trunk Assessment   Upper Extremity Assessment Upper Extremity Assessment: Defer to OT evaluation    Lower Extremity Assessment Lower Extremity Assessment: Generalized weakness    Cervical / Trunk Assessment Cervical / Trunk Assessment: Kyphotic  Communication   Communication: No difficulties  Cognition Arousal/Alertness: Awake/alert Behavior During Therapy: Flat affect Overall Cognitive Status: History of cognitive impairments - at baseline                                 General Comments: Slow to respond.        General Comments General comments (skin integrity, edema, etc.): SpO2 in 90s on RA. Resting HR in 90s. Sustained in 140s during in room activity.    Exercises     Assessment/Plan    PT Assessment All further PT needs can be met in the next venue of care  PT Problem List Decreased strength;Decreased mobility;Decreased activity tolerance;Decreased balance;Decreased knowledge of use of DME;Decreased cognition       PT Treatment Interventions      PT Goals (Current goals can be found in the Care Plan section)  Acute Rehab PT Goals Patient Stated Goal: home per wife PT Goal Formulation: All assessment and education complete, DC therapy    Frequency     Barriers to discharge        Co-evaluation               AM-PAC PT "6 Clicks" Mobility  Outcome Measure Help needed turning  from your back to your side while in a flat bed without using bedrails?: A Little Help needed moving from lying on your back to sitting on the side of a flat bed without using bedrails?: A Lot Help needed moving to and from a bed to a chair (including a wheelchair)?: A Little Help needed standing up from a chair using your arms (e.g., wheelchair or bedside chair)?: A Lot Help needed to walk in hospital room?: A Little Help needed climbing 3-5 steps with a railing? : A Lot 6 Click Score: 15    End of Session Equipment Utilized During Treatment: Gait belt Activity Tolerance: Treatment limited secondary to medical complications (Comment) (tachycardia) Patient left: in chair;with call bell/phone within reach;with family/visitor present Nurse Communication: Mobility status PT Visit Diagnosis: Muscle weakness (generalized) (M62.81);Difficulty in walking, not elsewhere classified (R26.2)    Time: 1019-1040 PT Time Calculation (min) (ACUTE ONLY): 21 min   Charges:   PT Evaluation $PT Eval Moderate Complexity: 1 Mod          Lorrin Goodell, PT  Office # [phone] Pager [phone]   Lorriane Shire 05/19/2021, 11:07 AM

## 2021-05-19 NOTE — TOC Transition Note (Signed)
Transition of Care [facility]) - CM/SW Discharge Note   Patient Details  Name: Edgar Thomas MRN: [id number] Date of Birth: May 12, 1930  [facility]) CM/SW Contact:  Kermit Balo, RN Phone Number: 05/19/2021, 10:56 AM   Clinical Narrative:    Patient is discharging home with home health services through [facility]. Patient/ spouse provided choice with [email or web address]. Wife without a preference. Information on the home health agency on AVS. Rollator ordered for home through [facility] and will be delivered to the home.  Pt has transport home and supervision at home.    Final next level of care: Home w Home Health Services Barriers to Discharge: No Barriers Identified   Patient Goals and CMS Choice   CMS Medicare.gov Compare Post Acute Care list provided to:: Patient Represenative (must comment) Choice offered to / list presented to : Spouse  Discharge Placement                       Discharge Plan and Services                DME Arranged: Walker rolling with seat DME Agency: [employer] Date DME Agency Contacted: 05/19/21   Representative spoke with at DME Agency: Velna Hatchet HH Arranged: PT, OT [facility] Agency: [facility] Date [facility] Agency Contacted: 05/19/21   Representative spoke with at [facility] Agency: Kandee Keen  Social Determinants of Health (SDOH) Interventions     Readmission Risk Interventions No flowsheet data found.

## 2021-05-19 NOTE — Progress Notes (Signed)
Discharge instructions (including medications) discussed with and copy provided to patient/caregiver 

## 2021-05-19 NOTE — Discharge Summary (Signed)
Physician Discharge Summary  Edgar Thomas [id number] DOB: [birth year] DOA: 05/17/2021  PCP: [name], MD  Admit date: 05/17/2021 Discharge date: 05/19/2021  Admitted From: Home Disposition:  Home  Recommendations for Outpatient Follow-up:  Follow up with PCP in 1-2 weeks Please obtain BMP/CBC in one week your next doctors visit.  Keflex PO q8hrs x 4 days  Tamiflu BID x 4 days  Increase to Metoprolol 37.5mg  po bid   Home Health: PT/OT Equipment/Devices: Incentive spirometer and flutter Discharge Condition: Stable CODE STATUS: DNR Diet recommendation: Heart healthy  Brief/Interim Summary: 87 year old male with history of CAD status post CABG, systolic CHF EF [id number], chronic A. fib on Eliquis, HTN, HLD, GERD, previous CVA admitted for lethargy and confusion.  He was found to have influenza A pneumonia, dehydration complicated by atrial fibrillation with RVR.  For his A. fib, metoprolol was increased.  His Eliquis was continued.  Mentation improved with IV fluids and starting him on Tamiflu.  The following day he was medically stable to be discharged.  UA was consistent with UTI therefore treated with 5 days of oral Keflex.  He does have nonobstructive renal stone for which she will need to follow-up outpatient urology.  Wife present at bedside and she has been updated.   Acute metabolic encephalopathy, resolved - Secondary to underlying infection and dehydration.  Supportive care.  Ammonia and TSH levels are unremarkable.   Influenza A infection, stable - Continue I-S/flutter valve.  4 more days of oral Tamiflu prescribed - Procalcitonin-neg   Chronic atrial fibrillation with RVR - Suspect from underlying infection and dehydration - Continue Eliquis.  Rate control drugs metoprolol, Cardizem, Tikosyn.  Heart rate running slightly higher therefore metoprolol increased to 37.5 mg twice daily.  Follow-up outpatient PCP and cardiology   Combined CHF, EF 45% - Currently appears to be  stable   Essential hypertension - On metoprolol, lisinopril, Cardizem   Bladder stone, nonobstructive Mild dysuria - Currently not causing an issue.  Follows outpatient Dr. Jeffie Pollock, he can follow this up outpatient upon discharge -UA shows mild UTI therefore will treat with oral Keflex, complete 4 more day upon discharge   CAD status post CABG - Currently chest pain-free.   Hyperlipidemia - Statin    Body mass index is 20.81 kg/m.         Discharge Diagnoses:  Principal Problem:   Influenza A Active Problems:   Coronary artery disease involving native coronary artery of native heart without angina pectoris   Mixed hyperlipidemia   Chronic combined systolic (congestive) and diastolic (congestive) heart failure (HCC)   Encephalopathy due to novel influenza A   Chronic atrial fibrillation with rapid ventricular response ([place])   Bladder stone   Essential hypertension      Consultations: None  Subjective: Feels better no complaints.  Wife is present at bedside.  Discharge Exam: Vitals:   05/19/21 0854 05/19/21 1004  BP: (!) 130/97   Pulse: 96 94  Resp: 18   Temp: 98.5 F (36.9 C)   SpO2: 95%    Vitals:   05/19/21 0410 05/19/21 0812 05/19/21 0854 05/19/21 1004  BP:   (!) 130/97   Pulse:   96 94  Resp: 20  18   Temp:   98.5 F (36.9 C)   TempSrc:      SpO2: 96% 96% 95%   Weight:      Height:        General: Pt is alert, awake, not in acute distress Cardiovascular: RRR, S1/S2 +,  no rubs, no gallops Respiratory: Minimal bilateral rhonchi Abdominal: Soft, NT, ND, bowel sounds + Extremities: no edema, no cyanosis  Discharge Instructions   Allergies as of 05/19/2021       Reactions   Contrast Media [iodinated Diagnostic Agents] Rash   19 years ago at [facility]   Metrizamide Rash   19 years ago at [facility] (Amipaque)   Other Other (See Comments)   Patient preference: NO MEAT OR DAIRY!!   Amiodarone Other (See  Comments)   Makes the patient walk sideways and he falls   Ciprofloxacin Other (See Comments)   Was told by MD to "not take"   Tramadol Nausea And Vomiting        Medication List     STOP taking these medications    doxycycline 100 MG tablet Commonly known as: VIBRA-TABS       TAKE these medications    cephALEXin 500 MG capsule Commonly known as: KEFLEX Take 1 capsule (500 mg total) by mouth every 8 (eight) hours for 4 days.   Crestor 20 MG tablet Generic drug: rosuvastatin Take 20 mg by mouth every morning.   diltiazem 120 MG 24 hr capsule Commonly known as: CARDIZEM CD Take 120 mg by mouth every morning.   dofetilide 125 MCG capsule Commonly known as: TIKOSYN Take 125 mcg by mouth 2 (two) times daily.   Eliquis 5 MG Tabs tablet Generic drug: apixaban Take 5 mg by mouth 2 (two) times daily.   ferrous sulfate 325 (65 FE) MG tablet Take 325 mg by mouth daily with breakfast.   furosemide 40 MG tablet Commonly known as: LASIX Take 20 mg by mouth daily.   guaiFENesin 600 MG 12 hr tablet Commonly known as: MUCINEX Take 2 tablets (1,200 mg total) by mouth 2 (two) times daily.   Jardiance 10 MG Tabs tablet Generic drug: empagliflozin Take 10 mg by mouth every morning.   metoprolol tartrate 25 MG tablet Commonly known as: LOPRESSOR Take 1 & 1/2 tablets (37.5 mg total) by mouth 2 (two) times daily. What changed: how much to take   multivitamin-lutein Caps capsule Take 1 capsule by mouth daily with breakfast.   One-A-Day Mens 50+ Advantage Tabs Take 1 tablet by mouth daily with breakfast.   nitroGLYCERIN 0.4 MG SL tablet Commonly known as: NITROSTAT Place 0.4 mg under the tongue every 5 (five) minutes as needed for chest pain.   oseltamivir 30 MG capsule Commonly known as: TAMIFLU Take 1 capsule (30 mg total) by mouth 2 (two) times daily for 4 days.   polyethylene glycol 17 g packet Commonly known as: MIRALAX / GLYCOLAX Take 17 g by mouth daily as  needed for moderate constipation.   Vitamin D-3 25 MCG (1000 UT) Caps Take 1,000 Units by mouth daily after breakfast.               Durable Medical Equipment  (From admission, onward)           Start     Ordered   05/19/21 1050  For home use only DME 4 wheeled rolling walker with seat  Once       Question:  Patient needs a walker to treat with the following condition  Answer:  Weakness   05/19/21 1049            Follow-up Information     Simone Curia, MD. Call in 1 month(s).   Specialty: Internal Medicine Contact information: [address]  [place] [address] [phone]         Care, [facility] Follow up.   Specialty: Home Health Services Why: The home health agency will contact you for the first home visit. Contact information: [address] [place] [address] [phone]                Allergies  Allergen Reactions   Contrast Media [Iodinated Diagnostic Agents] Rash    19 years ago at [facility]   Metrizamide Rash    19 years ago at [facility] (Amipaque)   Other Other (See Comments)    Patient preference: NO MEAT OR DAIRY!!   Amiodarone Other (See Comments)    Makes the patient walk sideways and he falls   Ciprofloxacin Other (See Comments)    Was told by MD to "not take"   Tramadol Nausea And Vomiting    You were cared for by a hospitalist during your hospital stay. If you have any questions about your discharge medications or the care you received while you were in the hospital after you are discharged, you can call the unit and asked to speak with the hospitalist on call if the hospitalist that took care of you is not available. Once you are discharged, your primary care physician will handle any further medical issues. Please note that no refills for any discharge medications will be authorized once you are discharged, as it is imperative that you return to your primary  care physician (or establish a relationship with a primary care physician if you do not have one) for your aftercare needs so that they can reassess your need for medications and monitor your lab values.   Procedures/Studies: DG Chest 2 View  Result Date: 05/17/2021 CLINICAL DATA:  Weakness, fatigue EXAM: CHEST - 2 VIEW COMPARISON:  Chest radiograph 11/08/2018 FINDINGS: Postsurgical changes reflecting prior CABG are again noted. The heart is enlarged, unchanged. The mediastinal contours are stable. There is no focal consolidation or pulmonary edema. There is no pleural effusion or pneumothorax. There is no acute osseous abnormality. IMPRESSION: Unchanged cardiomegaly. No radiographic evidence of acute cardiopulmonary process. Electronically Signed   By: Valetta Mole M.D.   On: 05/17/2021 16:25   CT Head Wo Contrast  Result Date: 05/02/2021 CLINICAL DATA:  Fall EXAM: CT HEAD WITHOUT CONTRAST TECHNIQUE: Contiguous axial images were obtained from the base of the skull through the vertex without intravenous contrast. COMPARISON:  Brain MRI 10/28/2020, CT head 04/24/2021 FINDINGS: Brain: There is no evidence of acute intracranial hemorrhage, extra-axial fluid collection, or acute infarct. There is moderate global parenchymal volume loss with enlargement of the ventricular system and extra-axial CSF spaces, unchanged. There is extensive confluent hypodensity throughout the subcortical and periventricular white matter likely reflecting advanced chronic white matter microangiopathy. There are remote infarcts in the right caudate head, right internal capsule, and left basal ganglia and thalamus. There is no solid mass lesion.  There is no midline shift. Vascular: There is dense calcification in the vertebrobasilar system and cavernous ICAs. Skull: Normal. Negative for fracture or focal lesion. Sinuses/Orbits: There is mild mucosal thickening in the paranasal sinuses. Bilateral lens implants are in place. The  globes and orbits are otherwise unremarkable. Other: None. IMPRESSION: 1. No acute intracranial hemorrhage or calvarial fracture. 2. Unchanged global parenchymal volume loss, advanced chronic white matter microangiopathy, and remote lacunar infarcts as above. Electronically Signed   By: Valetta Mole M.D.   On: 05/02/2021 15:10   CT  Head Wo Contrast  Result Date: 04/24/2021 CLINICAL DATA:  Delirium.  History of "mini stroke" in May. EXAM: CT HEAD WITHOUT CONTRAST TECHNIQUE: Contiguous axial images were obtained from the base of the skull through the vertex without intravenous contrast. COMPARISON:  CT head 11/09/2018 FINDINGS: Brain: No evidence of acute infarction, hemorrhage, hydrocephalus, extra-axial collection or mass lesion/mass effect. Chronic infarcts in the right internal capsule and left thalamus. Hypodensities in the periventricular and subcortical white matter, consistent with chronic small-vessel ischemic changes. Generalized parenchymal volume loss with associated enlargement of the ventricles. Vascular: No hyperdense vessel. Vascular calcifications at the skull base, unchanged compared to 11/09/2018 Skull: Normal. Negative for fracture or focal lesion. Sinuses/Orbits: No acute finding. Mild mucosal thickening in the left frontal sinus and a few scattered bilateral ethmoid air cells. Trace mucosal thickening also noted in the right frontal sinus. Other visualized paranasal sinuses and mastoid air cells are clear. Other: None. IMPRESSION: No acute intracranial abnormality. Stable exam with generalized parenchymal volume loss and chronic infarcts in the right internal capsule and left thalamus. Electronically Signed   By: Ileana Roup M.D.   On: 04/24/2021 09:52   US RENAL  Result Date: 05/17/2021 CLINICAL DATA:  Urinary bladder stone. EXAM: RENAL / URINARY TRACT ULTRASOUND COMPLETE COMPARISON:  CT renal 05/02/2021 FINDINGS: Right Kidney: Renal measurements: 9.6 x 4 x 3.4 cm = volume: 70 mL.  Echogenicity within normal limits. No mass or hydronephrosis visualized. Left Kidney: Renal measurements: 8.9 x 5.3 x 4 1 cm = volume: 103 mL. Echogenicity within normal limits. No mass or hydronephrosis visualized. Urinary bladder: There is a 1.9 cm calcified stone within the urinary bladder lumen. Otherwise appears normal for degree of bladder distention. Other: Hepatic cystic lesions incidentally noted as seen on CT renal 05/02/2021. IMPRESSION: 1.  A 1.9 cm calcified stone within the urinary bladder lumen. 2. Otherwise unremarkable renal ultrasound. Electronically Signed   By: Iven Finn M.D.   On: 05/17/2021 22:06   CT L-SPINE NO CHARGE  Result Date: 05/02/2021 CLINICAL DATA:  Fall EXAM: CT LUMBAR SPINE WITHOUT CONTRAST TECHNIQUE: Multidetector CT imaging of the lumbar spine was performed without intravenous contrast administration. Multiplanar CT image reconstructions were also generated. COMPARISON:  CT abdomen/pelvis 04/24/2021, CT pelvis 11/08/2018 FINDINGS: Segmentation: Standard; the lowest formed disc space is designated L5-S1 Alignment: There is grade 1 anterolisthesis of L4 on L5, unchanged since the prior CT. Alignment is otherwise normal. Vertebrae: Lumbar vertebral body heights are preserved. There is no evidence of acute fracture. There is no suspicious osseous lesion. Paraspinal and other soft tissues: The paraspinal soft tissues are unremarkable. The abdominal and pelvic viscera are described on the separately dictated CT abdomen/pelvis. Disc levels: There is marked disc space narrowing with vacuum disc phenomenon and associated degenerative endplate change at 075-GRM. There is more mild disc space narrowing with vacuum disc phenomenon at L4-L5. The other disc spaces are preserved. There is mild facet arthropathy throughout the lumbar spine, most advanced at L4-L5. The osseous spinal canal is patent. There is mild bilateral neural foraminal stenosis at L4-L5 and L5-S1. IMPRESSION: 1. No  acute fracture or traumatic malalignment of the lumbar spine. 2. Grade 1 anterolisthesis of L4 on L5, likely degenerative in nature. 3. Disc space narrowing with associated degenerative endplate change and bilateral facet arthropathy at L4-L5 and L5-S1, more advanced at L5-S1. Mild bilateral neural foraminal stenosis at both levels. Electronically Signed   By: Valetta Mole M.D.   On: 05/02/2021 15:06   CT Renal  Stone Study  Result Date: 05/02/2021 CLINICAL DATA:  Fall, hematuria, history of kidney stones EXAM: CT ABDOMEN AND PELVIS WITHOUT CONTRAST TECHNIQUE: Multidetector CT imaging of the abdomen and pelvis was performed following the standard protocol without IV contrast. COMPARISON:  CT abdomen/pelvis 04/24/2021 FINDINGS: Lower chest: Reticular opacities in the lung bases likely reflects atelectasis. The heart is enlarged. There are dense coronary artery calcifications. Hepatobiliary: Multiple liver cysts are again seen, unchanged. Scattered parenchymal calcifications are also unchanged likely reflecting small calcified granulomas. The gallbladder is unremarkable. There is no biliary ductal dilatation. Pancreas: The pancreas is atrophic. There are no focal lesions or contour abnormalities. There is no main pancreatic ductal dilatation or peripancreatic inflammatory change. Spleen: Unremarkable. Adrenals/Urinary Tract: The adrenals are unremarkable. A 4 mm nonobstructing left renal stone versus vascular calcification in the interpolar region is unchanged. There are no other stones in either kidney. There are no focal lesions, within the confines of noncontrast technique. There is no hydronephrosis or hydroureter. A large bladder stone measuring 1.8 cm is again seen in the bladder. The stone is now layering dependently at the midline. The decompressed bladder is otherwise unremarkable. Stomach/Bowel: The stomach is unremarkable. There is no evidence of bowel Vascular/Lymphatic: There is dense calcified  atherosclerotic plaque throughout the nonaneurysmal abdominal aorta. There is no abdominal or pelvic lymphadenopathy. Reproductive: The prostate is mildly enlarged. The seminal vesicles are unremarkable. Other: There is no ascites or free air. Musculoskeletal: There is age-indeterminate mild compression deformity of the T10 vertebral body, not imaged on the prior study from 04/24/2021, but favored chronic. Otherwise, there is no acute osseous abnormality or aggressive osseous lesion. IMPRESSION: 1. Large calculus in the bladder measuring up to 1.8 cm layering dependently at the midline. 2. Unchanged 4 mm renal stone versus vascular calcification in the left interpolar region. No hydronephrosis or hydroureter or obstructing stone. 3. Mild age-indeterminate compression deformity of the T10 vertebral body, favored chronic. Correlate with any point tenderness, and MRI may be considered as indicated to evaluate for acuity 4. Cardiomegaly with dense coronary artery calcifications. 5. Moderate stool burden throughout the colon. Aortic Atherosclerosis (ICD10-I70.0). Electronically Signed   By: Valetta Mole M.D.   On: 05/02/2021 15:01   CT Renal Stone Study  Result Date: 04/24/2021 CLINICAL DATA:  Hematuria for 2 days of unknown cause EXAM: CT ABDOMEN AND PELVIS WITHOUT CONTRAST TECHNIQUE: Multidetector CT imaging of the abdomen and pelvis was performed following the standard protocol without IV contrast. COMPARISON:  CT pelvis 11/08/2018 FINDINGS: Lower chest: Accentuation of bibasilar interstitial markings and minimal bibasilar atelectasis Hepatobiliary: Calcified granulomata and cysts within liver. Gallbladder unremarkable. No additional hepatic masses. Pancreas: Atrophic pancreas without mass Spleen: Normal appearance.  Small adjacent splenule Adrenals/Urinary Tract: Adrenal glands normal appearance. Small calcifications at LEFT kidney which could be renovascular or tiny calculi. No renal mass, hydronephrosis or  hydroureter. No ureteral calculus. Large bladder calculus identified on RIGHT at/adjacent to ureterovesical junction, 13 x 11 mm. Stomach/Bowel: Increased stool in rectum. Prominent stool in proximal half of colon. Stomach decompressed. Remaining bowel loops unremarkable. Vascular/Lymphatic: Pelvic phleboliths. Extensive atherosclerotic calcifications aorta, iliac arteries, visceral arteries, coronary arteries. Enlargement of cardiac chambers. No adenopathy. Reproductive: Mild prostatic enlargement, gland measuring 5.3 x 3.5 x 3.9 cm (volume = 38 cm^3) Other: No free air or free fluid. Question small BILATERAL inguinal hernias containing fat. Musculoskeletal: Bones demineralized IMPRESSION: Large bladder calculus at/adjacent to RIGHT ureterovesical junction measuring 13 x 11 mm without hydronephrosis or hydroureter. Small calcifications at LEFT kidney  which could be renovascular or tiny nonobstructing calculi. Mild prostatic enlargement. Increased stool in rectum and proximal half of colon. Question small BILATERAL inguinal hernias containing fat. Aortic Atherosclerosis (ICD10-I70.0). Electronically Signed   By: Lavonia Dana M.D.   On: 04/24/2021 12:09     The results of significant diagnostics from this hospitalization (including imaging, microbiology, ancillary and laboratory) are listed below for reference.     Microbiology: Recent Results (from the past 240 hour(s))  Urine Culture     Status: Abnormal   Collection Time: 05/17/21  6:36 PM   Specimen: Urine, Clean Catch  Result Value Ref Range Status   Specimen Description URINE, CLEAN CATCH  Final   Special Requests   Final    NONE Performed at [facility], [address], [place], [place] [address]    Culture MULTIPLE SPECIES PRESENT, SUGGEST RECOLLECTION (A)  Final   Report Status 05/19/2021 FINAL  Final  Resp Panel by RT-PCR (Flu A&B, Covid) Nasopharyngeal Swab     Status: Abnormal   Collection Time: 05/17/21  8:08 PM   Specimen:  Nasopharyngeal Swab; Nasopharyngeal(NP) swabs in vial transport medium  Result Value Ref Range Status   SARS Coronavirus 2 by RT PCR NEGATIVE NEGATIVE Final    Comment: (NOTE) SARS-CoV-2 target nucleic acids are NOT DETECTED.  The SARS-CoV-2 RNA is generally detectable in upper respiratory specimens during the acute phase of infection. The lowest concentration of SARS-CoV-2 viral copies this assay can detect is 138 copies/mL. A negative result does not preclude SARS-Cov-2 infection and should not be used as the sole basis for treatment or other patient management decisions. A negative result may occur with  improper specimen collection/handling, submission of specimen other than nasopharyngeal swab, presence of viral mutation(s) within the areas targeted by this assay, and inadequate number of viral copies(<138 copies/mL). A negative result must be combined with clinical observations, patient history, and epidemiological information. The expected result is Negative.  Fact Sheet for Patients:  EntrepreneurPulse.com.au  Fact Sheet for Healthcare Providers:  IncredibleEmployment.be  This test is no t yet approved or cleared by the [place] FDA and  has been authorized for detection and/or diagnosis of SARS-CoV-2 by FDA under an Emergency Use Authorization (EUA). This EUA will remain  in effect (meaning this test can be used) for the duration of the COVID-19 declaration under Section 564(b)(1) of the Act, 21 U.S.C.section 360bbb-3(b)(1), unless the authorization is terminated  or revoked sooner.       Influenza A by PCR POSITIVE (A) NEGATIVE Final   Influenza B by PCR NEGATIVE NEGATIVE Final    Comment: (NOTE) The Xpert Xpress SARS-CoV-2/FLU/RSV plus assay is intended as an aid in the diagnosis of influenza from Nasopharyngeal swab specimens and should not be used as a sole basis for treatment. Nasal washings and aspirates are unacceptable  for Xpert Xpress SARS-CoV-2/FLU/RSV testing.  Fact Sheet for Patients: EntrepreneurPulse.com.au  Fact Sheet for Healthcare Providers: IncredibleEmployment.be  This test is not yet approved or cleared by the [place] FDA and has been authorized for detection and/or diagnosis of SARS-CoV-2 by FDA under an Emergency Use Authorization (EUA). This EUA will remain in effect (meaning this test can be used) for the duration of the COVID-19 declaration under Section 564(b)(1) of the Act, 21 U.S.C. section 360bbb-3(b)(1), unless the authorization is terminated or revoked.  Performed at [facility], [place] [address], [place], [place] [address]   Culture, blood (routine x 2)     Status:  None (Preliminary result)   Collection Time: 05/18/21  2:26 AM   Specimen: BLOOD  Result Value Ref Range Status   Specimen Description BLOOD LEFT ANTECUBITAL  Final   Special Requests   Final    BOTTLES DRAWN AEROBIC AND ANAEROBIC Blood Culture adequate volume   Culture   Final    NO GROWTH 1 DAY Performed at [facility], [address], [place] [address]    Report Status PENDING  Incomplete  Culture, blood (routine x 2)     Status: None (Preliminary result)   Collection Time: 05/18/21  2:31 AM   Specimen: BLOOD RIGHT HAND  Result Value Ref Range Status   Specimen Description BLOOD RIGHT HAND  Final   Special Requests   Final    BOTTLES DRAWN AEROBIC AND ANAEROBIC Blood Culture results may not be optimal due to an inadequate volume of blood received in culture bottles   Culture   Final    NO GROWTH 1 DAY Performed at [place] [facility], [place] [address] [place], [place] [address]    Report Status PENDING  Incomplete     Labs: BNP (last 3 results) Recent Labs    05/17/21 1905  BNP [id number]*   Basic Metabolic Panel: Recent Labs  Lab 05/17/21 1540 05/18/21 0214 05/19/21 0220  NA 138 135 134*  K 3.9 3.6 3.7  CL 102 102 103  CO2 27  24 24   GLUCOSE 113* 94 88  BUN 22 23 18   CREATININE 1.15 0.96 0.84  CALCIUM 8.7* 8.7* 8.2*  MG  --  2.2   2.2 1.9   Liver Function Tests: Recent Labs  Lab 05/17/21 1540 05/18/21 0214  AST 45* 43*  ALT 31 30  ALKPHOS 105 103  BILITOT 0.7 0.8  PROT 7.2 7.5  ALBUMIN 3.2* 3.2*   No results for input(s): LIPASE, AMYLASE in the last 168 hours. Recent Labs  Lab 05/17/21 1905  AMMONIA <10   CBC: Recent Labs  Lab 05/17/21 1540 05/18/21 0214 05/19/21 0220  WBC 8.0 5.8 3.9*  NEUTROABS 5.3 3.3  --   HGB 14.1 15.1 13.7  HCT 43.4 46.3 41.5  MCV 98.9 98.5 96.5  PLT 265 252 215   Cardiac Enzymes: No results for input(s): CKTOTAL, CKMB, CKMBINDEX, TROPONINI in the last 168 hours. BNP: Invalid input(s): POCBNP CBG: No results for input(s): GLUCAP in the last 168 hours. D-Dimer No results for input(s): DDIMER in the last 72 hours. Hgb A1c No results for input(s): HGBA1C in the last 72 hours. Lipid Profile No results for input(s): CHOL, HDL, LDLCALC, TRIG, CHOLHDL, LDLDIRECT in the last 72 hours. Thyroid function studies Recent Labs    05/17/21 1905  TSH 1.315   Anemia work up Recent Labs    05/18/21 0214  VITAMINB12 660   Urinalysis    Component Value Date/Time   COLORURINE YELLOW 05/17/2021 [place] 05/17/2021 1533   LABSPEC 1.025 05/17/2021 1533   PHURINE 6.0 05/17/2021 1533   GLUCOSEU >=500 (A) 05/17/2021 1533   HGBUR LARGE (A) 05/17/2021 [place] 05/17/2021 1533   [place] 05/17/2021 1533   PROTEINUR NEGATIVE 05/17/2021 1533   UROBILINOGEN 0.2 12/01/2013 1410   NITRITE NEGATIVE 05/17/2021 1533   LEUKOCYTESUR TRACE (A) 05/17/2021 1533   Sepsis Labs Invalid input(s): PROCALCITONIN,  WBC,  LACTICIDVEN Microbiology Recent Results (from the past 240 hour(s))  Urine Culture     Status: Abnormal   Collection Time: 05/17/21  6:36 PM   Specimen: Urine, Clean Catch  Result Value Ref Range Status   Specimen  Description URINE, CLEAN CATCH  Final   Special Requests   Final    NONE Performed at [facility], [address] [place], [place] [address]    Culture MULTIPLE SPECIES PRESENT, SUGGEST RECOLLECTION (A)  Final   Report Status 05/19/2021 FINAL  Final  Resp Panel by RT-PCR (Flu A&B, Covid) Nasopharyngeal Swab     Status: Abnormal   Collection Time: 05/17/21  8:08 PM   Specimen: Nasopharyngeal Swab; Nasopharyngeal(NP) swabs in vial transport medium  Result Value Ref Range Status   SARS Coronavirus 2 by RT PCR NEGATIVE NEGATIVE Final    Comment: (NOTE) SARS-CoV-2 target nucleic acids are NOT DETECTED.  The SARS-CoV-2 RNA is generally detectable in upper respiratory specimens during the acute phase of infection. The lowest concentration of SARS-CoV-2 viral copies this assay can detect is 138 copies/mL. A negative result does not preclude SARS-Cov-2 infection and should not be used as the sole basis for treatment or other patient management decisions. A negative result may occur with  improper specimen collection/handling, submission of specimen other than nasopharyngeal swab, presence of viral mutation(s) within the areas targeted by this assay, and inadequate number of viral copies(<138 copies/mL). A negative result must be combined with clinical observations, patient history, and epidemiological information. The expected result is Negative.  Fact Sheet for Patients:  EntrepreneurPulse.com.au  Fact Sheet for Healthcare Providers:  IncredibleEmployment.be  This test is no t yet approved or cleared by the [place] FDA and  has been authorized for detection and/or diagnosis of SARS-CoV-2 by FDA under an Emergency Use Authorization (EUA). This EUA will remain  in effect (meaning this test can be used) for the duration of the COVID-19 declaration under Section 564(b)(1) of the Act, 21 U.S.C.section 360bbb-3(b)(1), unless the authorization  is terminated  or revoked sooner.       Influenza A by PCR POSITIVE (A) NEGATIVE Final   Influenza B by PCR NEGATIVE NEGATIVE Final    Comment: (NOTE) The Xpert Xpress SARS-CoV-2/FLU/RSV plus assay is intended as an aid in the diagnosis of influenza from Nasopharyngeal swab specimens and should not be used as a sole basis for treatment. Nasal washings and aspirates are unacceptable for Xpert Xpress SARS-CoV-2/FLU/RSV testing.  Fact Sheet for Patients: EntrepreneurPulse.com.au  Fact Sheet for Healthcare Providers: IncredibleEmployment.be  This test is not yet approved or cleared by the [place] FDA and has been authorized for detection and/or diagnosis of SARS-CoV-2 by FDA under an Emergency Use Authorization (EUA). This EUA will remain in effect (meaning this test can be used) for the duration of the COVID-19 declaration under Section 564(b)(1) of the Act, 21 U.S.C. section 360bbb-3(b)(1), unless the authorization is terminated or revoked.  Performed at [facility], [place] [address], [place], [place] [address]   Culture, blood (routine x 2)     Status: None (Preliminary result)   Collection Time: 05/18/21  2:26 AM   Specimen: BLOOD  Result Value Ref Range Status   Specimen Description BLOOD LEFT ANTECUBITAL  Final   Special Requests   Final    BOTTLES DRAWN AEROBIC AND ANAEROBIC Blood Culture adequate volume   Culture   Final    NO GROWTH 1 DAY Performed at [facility], [place] [address], [place], [place] [address]    Report Status PENDING  Incomplete  Culture, blood (routine x 2)     Status:  None (Preliminary result)   Collection Time: 05/18/21  2:31 AM   Specimen: BLOOD RIGHT HAND  Result Value Ref Range Status   Specimen Description BLOOD RIGHT HAND  Final   Special Requests   Final    BOTTLES DRAWN AEROBIC AND ANAEROBIC Blood Culture results may not be optimal due to an inadequate volume of blood received in culture  bottles   Culture   Final    NO GROWTH 1 DAY Performed at [facility], [place] [address], [place], [place] [address]    Report Status PENDING  Incomplete     Time coordinating discharge:  I have spent 35 minutes face to face with the patient and on the ward discussing the patients care, assessment, plan and disposition with other care givers. >50% of the time was devoted counseling the patient about the risks and benefits of treatment/Discharge disposition and coordinating care.   SIGNED:   Damita Lack, MD  [facility] 05/19/2021, 11:30 AM   If 7PM-7AM, please contact night-coverage

## 2021-05-19 NOTE — Consult Note (Addendum)
WOC Nurse Consult Note: Reason for Consult: Consult requested for scrotum/inner groin folds/rectum Wound type: Red moist macerated skin to all areas with red macular papular rash.  Appearance is consistent with moisture associated skin damage and probable candidiasis. No open wounds. Pt was incontinent of urine prior condom cath application.  ICD-10 CM Codes for Irritant Dermatitis: L24A2 - Due to fecal, urinary or dual incontinence L30.4  - Erythema intertrigo; genital/thigh intertrigo.   Dressing procedure/placement/frequency: Topical treatment orders provided for bedside nurses to perform as follows to promote healing: Apply antifungal powder to scrotum/groin folds/rectum TID and with each turning or cleaning episode. Please re-consult if further assistance is needed.  Thank-you,  Cammie Mcgee MSN, RN, CWOCN, [place], CNS [phone]

## 2021-05-20 ENCOUNTER — Emergency Department (HOSPITAL_BASED_OUTPATIENT_CLINIC_OR_DEPARTMENT_OTHER): Admit: 2021-05-20 | Discharge: 2021-05-20 | Disposition: A | Discharge status: Home / Self Care | Payer: Medicare Other

## 2021-05-20 ENCOUNTER — Encounter (HOSPITAL_COMMUNITY): Payer: Self-pay | Admitting: Emergency Medicine

## 2021-05-20 ENCOUNTER — Other Ambulatory Visit: Payer: Self-pay

## 2021-05-20 ENCOUNTER — Observation Stay (HOSPITAL_COMMUNITY)
Admission: EM | Admit: 2021-05-20 | Discharge: 2021-05-23 | Disposition: A | Discharge status: Home Health | Payer: Medicare Other | Attending: Internal Medicine | Admitting: Internal Medicine

## 2021-05-20 DIAGNOSIS — I10 Essential (primary) hypertension: Secondary | ICD-10-CM | POA: Diagnosis present

## 2021-05-20 DIAGNOSIS — Z8673 Personal history of transient ischemic attack (TIA), and cerebral infarction without residual deficits: Secondary | ICD-10-CM | POA: Insufficient documentation

## 2021-05-20 DIAGNOSIS — N390 Urinary tract infection, site not specified: Secondary | ICD-10-CM | POA: Diagnosis not present

## 2021-05-20 DIAGNOSIS — R6510 Systemic inflammatory response syndrome (SIRS) of non-infectious origin without acute organ dysfunction: Secondary | ICD-10-CM | POA: Diagnosis not present

## 2021-05-20 DIAGNOSIS — J1100 Influenza due to unidentified influenza virus with unspecified type of pneumonia: Secondary | ICD-10-CM | POA: Diagnosis not present

## 2021-05-20 DIAGNOSIS — I482 Chronic atrial fibrillation, unspecified: Secondary | ICD-10-CM | POA: Diagnosis not present

## 2021-05-20 DIAGNOSIS — I5042 Chronic combined systolic (congestive) and diastolic (congestive) heart failure: Secondary | ICD-10-CM | POA: Diagnosis not present

## 2021-05-20 DIAGNOSIS — Z79899 Other long term (current) drug therapy: Secondary | ICD-10-CM | POA: Insufficient documentation

## 2021-05-20 DIAGNOSIS — Z951 Presence of aortocoronary bypass graft: Secondary | ICD-10-CM

## 2021-05-20 DIAGNOSIS — Z20822 Contact with and (suspected) exposure to covid-19: Secondary | ICD-10-CM | POA: Insufficient documentation

## 2021-05-20 DIAGNOSIS — I82411 Acute embolism and thrombosis of right femoral vein: Principal | ICD-10-CM | POA: Insufficient documentation

## 2021-05-20 DIAGNOSIS — M79661 Pain in right lower leg: Secondary | ICD-10-CM

## 2021-05-20 DIAGNOSIS — R252 Cramp and spasm: Secondary | ICD-10-CM | POA: Diagnosis not present

## 2021-05-20 DIAGNOSIS — I2510 Atherosclerotic heart disease of native coronary artery without angina pectoris: Secondary | ICD-10-CM | POA: Diagnosis present

## 2021-05-20 DIAGNOSIS — K219 Gastro-esophageal reflux disease without esophagitis: Secondary | ICD-10-CM | POA: Diagnosis not present

## 2021-05-20 DIAGNOSIS — M79604 Pain in right leg: Secondary | ICD-10-CM | POA: Diagnosis present

## 2021-05-20 DIAGNOSIS — I110 Hypertensive heart disease with heart failure: Secondary | ICD-10-CM | POA: Insufficient documentation

## 2021-05-20 DIAGNOSIS — Z7901 Long term (current) use of anticoagulants: Secondary | ICD-10-CM | POA: Diagnosis not present

## 2021-05-20 DIAGNOSIS — N210 Calculus in bladder: Secondary | ICD-10-CM | POA: Diagnosis not present

## 2021-05-20 DIAGNOSIS — Z6820 Body mass index (BMI) 20.0-20.9, adult: Secondary | ICD-10-CM | POA: Diagnosis not present

## 2021-05-20 DIAGNOSIS — R52 Pain, unspecified: Secondary | ICD-10-CM

## 2021-05-20 DIAGNOSIS — I82409 Acute embolism and thrombosis of unspecified deep veins of unspecified lower extremity: Secondary | ICD-10-CM | POA: Diagnosis present

## 2021-05-20 DIAGNOSIS — E782 Mixed hyperlipidemia: Secondary | ICD-10-CM | POA: Diagnosis not present

## 2021-05-20 LAB — CBC WITH DIFFERENTIAL/PLATELET
Abs Immature Granulocytes: 0 10*3/uL (ref 0.00–0.07)
Basophils Absolute: 0 10*3/uL (ref 0.0–0.1)
Basophils Relative: 0 %
Eosinophils Absolute: 0 10*3/uL (ref 0.0–0.5)
Eosinophils Relative: 0 %
HCT: 42.3 % (ref 39.0–52.0)
Hemoglobin: 14 g/dL (ref 13.0–17.0)
Lymphocytes Relative: 22 %
Lymphs Abs: 1.5 10*3/uL (ref 0.7–4.0)
MCH: 32.3 pg (ref 26.0–34.0)
MCHC: 33.1 g/dL (ref 30.0–36.0)
MCV: 97.5 fL (ref 80.0–100.0)
Monocytes Absolute: 0.3 10*3/uL (ref 0.1–1.0)
Monocytes Relative: 4 %
Neutro Abs: 5 10*3/uL (ref 1.7–7.7)
Neutrophils Relative %: 74 %
Platelets: 214 10*3/uL (ref 150–400)
RBC: 4.34 MIL/uL (ref 4.22–5.81)
RDW: 13.5 % (ref 11.5–15.5)
WBC: 6.8 10*3/uL (ref 4.0–10.5)
nRBC: 0 % (ref 0.0–0.2)
nRBC: 0 /100 WBC

## 2021-05-20 LAB — BASIC METABOLIC PANEL
Anion gap: 7 (ref 5–15)
BUN: 16 mg/dL (ref 8–23)
CO2: 25 mmol/L (ref 22–32)
Calcium: 8.8 mg/dL — ABNORMAL LOW (ref 8.9–10.3)
Chloride: 103 mmol/L (ref 98–111)
Creatinine, Ser: 0.91 mg/dL (ref 0.61–1.24)
GFR, Estimated: >60 mL/min (ref >60)
Glucose, Bld: 105 mg/dL — ABNORMAL HIGH (ref 70–99)
Potassium: 4.4 mmol/L (ref 3.5–5.1)
Sodium: 135 mmol/L (ref 135–145)

## 2021-05-20 LAB — PROTIME-INR
INR: 1.5 — ABNORMAL HIGH (ref 0.8–1.2)
Prothrombin Time: 17.8 seconds — ABNORMAL HIGH (ref 11.4–15.2)

## 2021-05-20 LAB — APTT: aPTT: 35 seconds (ref 24–36)

## 2021-05-20 MED ORDER — DIPHENHYDRAMINE HCL 25 MG PO CAPS: 50.0000 mg | ORAL_CAPSULE | Freq: Once | ORAL | Status: AC

## 2021-05-20 MED ORDER — DIPHENHYDRAMINE HCL 50 MG/ML IJ SOLN
50.0000 mg | Freq: Once | INTRAMUSCULAR | Status: AC
  Administered 2021-05-21: 01:00:00 50 mg via INTRAVENOUS
  Filled 2021-05-20: qty 1

## 2021-05-20 MED ORDER — HYDROCORTISONE SOD SUC (PF) 100 MG IJ SOLR
200.0000 mg | Freq: Once | INTRAMUSCULAR | Status: AC
  Administered 2021-05-20: 22:00:00 200 mg via INTRAVENOUS
  Filled 2021-05-20: qty 4

## 2021-05-20 NOTE — Progress Notes (Signed)
Right lower extremity venous duplex completed. Refer to "CV Proc" under chart review to view preliminary results.  Preliminary results discussed with Darcey Nora.  05/20/2021 6:36 PM Eula Fried., MHA, RVT, RDCS, RDMS

## 2021-05-20 NOTE — ED Provider Notes (Signed)
[facility] EMERGENCY DEPARTMENT Provider Note   CSN: [id number] Arrival date & time: 05/20/21  1619     History Chief Complaint  Patient presents with   Leg Pain    Edgar Thomas is a 87 y.o. male.  Patient presents ER chief complaint of right leg cramping along the right calf.  Symptoms ongoing since yesterday.  Patient was evaluated by her friend who is a physician and told to the ER for possible DVT.  Patient was recently evaluated and admitted for generalized fatigue and weakness.  He continues on Eliquis for history of atrial fibrillation.  Denies fevers or cough or vomiting or diarrhea.  Denies any chest pain.      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   Stroke [facility])    TIA (transient ischemic attack)    Vitamin D deficiency     Patient Active Problem List   Diagnosis Date Noted   DVT (deep venous thrombosis) ([place]) 05/21/2021   Encephalopathy due to novel influenza A 05/18/2021   Influenza A 05/18/2021   Chronic atrial fibrillation with rapid ventricular response ([place]) 05/18/2021   Bladder stone 05/18/2021   Essential hypertension 05/18/2021   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   Hematoma of right thigh 11/19/2018   Symptomatic anemia 11/08/2018   Pseudophakia 03/06/2018   S/P laser cataract surgery 03/06/2018   AMD (age-related macular degeneration), bilateral 02/26/2018   Combined form of senile cataract 02/17/2018   Herpes zoster with complication [id number]   UTI (urinary tract infection) 02/18/2016   Chronic combined systolic (congestive) and diastolic (congestive) heart failure ([place]) 02/17/2016   CHF (congestive heart failure) ([place]) 02/17/2016    Gait disturbance 02/17/2016   Near syncope 09/03/2015   Hyponatremia 09/03/2015   Bronchitis 08/29/2015   Encounter for therapeutic drug monitoring 12/12/2013   Anticoagulated on warfarin 12/10/2013   Anticoagulated 12/10/2013   Cardiomyopathy, ischemic- EF 40-45% 12/09/2013   Atrial fibrillation with RVR ([place]) 12/05/2013   Coronary artery disease involving native coronary artery of native heart without angina pectoris 12/05/2013   GERD without esophagitis    Mixed hyperlipidemia    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 LIMA to LAD, sequential SVG to OM 1 & OM 2, SVG to PD;  Surgeon: Grace Isaac, MD;  Location: [facility];  Service: Open Heart Surgery;  Laterality: N/A;   ENDOVEIN HARVEST OF GREATER SAPHENOUS VEIN Right 12/03/2013   Procedure: ENDOVEIN HARVEST OF GREATER SAPHENOUS VEIN;  Surgeon: Grace Isaac, MD;  Location: [facility];  Service: Open Heart Surgery;  Laterality: Right;   INTRAOPERATIVE TRANSESOPHAGEAL ECHOCARDIOGRAM N/A 12/03/2013   Procedure: INTRAOPERATIVE TRANSESOPHAGEAL ECHOCARDIOGRAM;  Surgeon: Grace Isaac, MD;  Location: [facility];  Service: Open  Heart Surgery;  Laterality: N/A;       Family History  Problem Relation Age of Onset   Heart disease Mother        died at age 8 of heart attack   Leukemia Sister    Heart disease Brother    Hypertension Brother     Social History   Tobacco Use   Smoking status: Never   Smokeless tobacco: Never  Substance Use Topics   Alcohol use: No   Drug use: No    Home Medications Prior to Admission medications   Medication Sig Start Date End Date Taking?  Authorizing Provider  apixaban (ELIQUIS) 5 MG TABS tablet Take 5 mg by mouth 2 (two) times daily.   Yes [provider]  cephALEXin (KEFLEX) 500 MG capsule Take 1 capsule (500 mg total) by mouth every 8 (eight) hours for 4 days. 05/19/21 05/23/21 Yes Amin, Jeanella Flattery, MD  Cholecalciferol (VITAMIN D-3) 25 MCG (1000 UT) CAPS Take 1,000 Units by mouth daily after breakfast.   Yes [provider]  CRESTOR 20 MG tablet Take 20 mg by mouth every morning.  08/26/14  Yes [provider]  diltiazem (CARDIZEM CD) 120 MG 24 hr capsule Take 120 mg by mouth every morning. 05/10/21  Yes [provider]  dofetilide (TIKOSYN) 125 MCG capsule Take 125 mcg by mouth 2 (two) times daily. 05/20/18  Yes [provider]  ferrous sulfate 325 (65 FE) MG tablet Take 325 mg by mouth daily with breakfast.   Yes [provider]  furosemide (LASIX) 40 MG tablet Take 20 mg by mouth daily. 05/10/21  Yes [provider]  JARDIANCE 10 MG TABS tablet Take 10 mg by mouth every morning. 05/10/21  Yes [provider]  metoprolol tartrate (LOPRESSOR) 25 MG tablet Take 1 & 1/2 tablets (37.5 mg total) by mouth 2 (two) times daily. 05/19/21 06/18/21 Yes Amin, Jeanella Flattery, MD  Multiple Vitamins-Minerals (ONE-A-DAY MENS 50+ ADVANTAGE) TABS Take 1 tablet by mouth daily with breakfast.    Yes [provider]  multivitamin-lutein (OCUVITE-LUTEIN) CAPS capsule Take 1 capsule by mouth daily with breakfast.    Yes [provider]  nitroGLYCERIN (NITROSTAT) 0.4 MG SL tablet Place 0.4 mg under the tongue every 5 (five) minutes as needed for chest pain.   Yes [provider]  oseltamivir (TAMIFLU) 30 MG capsule Take 1 capsule (30 mg total) by mouth 2 (two) times daily for 4 days. 05/19/21 05/23/21 Yes Amin, Jeanella Flattery, MD  guaiFENesin (MUCINEX) 600 MG 12 hr tablet Take 2 tablets (1,200 mg total) by mouth 2 (two) times daily. Patient not taking:  Reported on 05/18/2021 08/30/15   Rai, Vernelle Emerald, MD  polyethylene glycol (MIRALAX / GLYCOLAX) packet Take 17 g by mouth daily as needed for moderate constipation. Patient not taking: Reported on 05/18/2021 08/30/15   Mendel Corning, MD    Allergies    Contrast media [iodinated contrast media], Metrizamide, Other, Amiodarone, Ciprofloxacin, and Tramadol  Review of Systems   Review of Systems  Constitutional:  Negative for fever.  HENT:  Negative for ear pain and sore throat.   Eyes:  Negative for pain.  Respiratory:  Negative for cough.   Cardiovascular:  Negative for chest pain.  Gastrointestinal:  Negative for abdominal pain.  Genitourinary:  Negative for flank pain.  Musculoskeletal:  Negative for back pain.  Skin:  Negative for color change and rash.  Neurological:  Negative for syncope.  All other systems reviewed and  are negative.  Physical Exam Updated Vital Signs BP (!) 146/78    Pulse 65    Temp 98.4 F (36.9 C)    Resp 18    SpO2 95%   Physical Exam Constitutional:      Appearance: He is well-developed.  HENT:     Head: Normocephalic.     Nose: Nose normal.  Eyes:     Extraocular Movements: Extraocular movements intact.  Cardiovascular:     Rate and Rhythm: Normal rate.  Pulmonary:     Effort: Pulmonary effort is normal.  Musculoskeletal:     Comments: Bilateral lower extremities appear normal.  Equivocal mild increased welling in the right lower extremity.  No abnormal warmth no tenderness on exam no cellulitis noted.  Neurovascular intact bilateral lower extremities otherwise  Skin:    Coloration: Skin is not jaundiced.  Neurological:     Mental Status: He is alert. Mental status is at baseline.    ED Results / Procedures / Treatments   Labs (all labs ordered are listed, but only abnormal results are displayed) Labs Reviewed  BASIC METABOLIC PANEL - Abnormal; Notable for the following components:      Result Value   Glucose, Bld 105 (*)    Calcium 8.8  (*)    All other components within normal limits  PROTIME-INR - Abnormal; Notable for the following components:   Prothrombin Time 17.8 (*)    INR 1.5 (*)    All other components within normal limits  RESP PANEL BY RT-PCR (FLU A&B, COVID) [id number]  CBC WITH DIFFERENTIAL/PLATELET  APTT    EKG None  Radiology VAS [place] LOWER EXTREMITY VENOUS (DVT) (7a-7p)  Result Date: 05/20/2021  Lower Venous DVT Study Patient Name:  LEHMON WORSHAM  Date of Exam:   05/20/2021 Medical Rec #: [id number]     Accession #:    [id number] Date of Birth: 1930/02/06      Patient Gender: M Patient Age:   65 years Exam Location[facility]  [facility] Procedure:      VAS [place] LOWER EXTREMITY VENOUS (DVT) Referring Phys: CHRISTIAN PROSPERI --------------------------------------------------------------------------------  Indications: Pain and cramping in right lower extremity.  Limitations: Patient position. Comparison Study: No prior study Performing Technologist: Maudry Mayhew MHA, RDMS, RVT, RDCS  Examination Guidelines: A complete evaluation includes B-mode imaging, spectral Doppler, color Doppler, and power Doppler as needed of all accessible portions of each vessel. Bilateral testing is considered an integral part of a complete examination. Limited examinations for reoccurring indications may be performed as noted. The reflux portion of the exam is performed with the patient in reverse Trendelenburg.  +---------+---------------+---------+-----------+----------+-----------------+  RIGHT     Compressibility Phasicity Spontaneity Properties Thrombus Aging     +---------+---------------+---------+-----------+----------+-----------------+  CFV       Full            Yes       Yes                                       +---------+---------------+---------+-----------+----------+-----------------+  SFJ       Full                                                                 +---------+---------------+---------+-----------+----------+-----------------+  FV Prox   Partial                   No                     Age Indeterminate  +---------+---------------+---------+-----------+----------+-----------------+  FV Mid    Full                                                                +---------+---------------+---------+-----------+----------+-----------------+  FV Distal Full                                                                +---------+---------------+---------+-----------+----------+-----------------+  PFV       Full                                                                +---------+---------------+---------+-----------+----------+-----------------+  POP       Full                                                                +---------+---------------+---------+-----------+----------+-----------------+  PTV       None                      No                     Age Indeterminate  +---------+---------------+---------+-----------+----------+-----------------+   Right Technical Findings: Not visualized segments include peroneal veins.  Left Technical Findings: Not visualized segments include CFV.   Summary: RIGHT: - Findings consistent with age indeterminate deep vein thrombosis involving the right proximal femoral vein, and right posterior tibial veins. - No cystic structure found in the popliteal fossa.   *See table(s) above for measurements and observations.    Preliminary     Procedures Procedures   Medications Ordered in ED Medications  diphenhydrAMINE (BENADRYL) capsule 50 mg (has no administration in time range)    Or  diphenhydrAMINE (BENADRYL) injection 50 mg (has no administration in time range)  enoxaparin (LOVENOX) injection 65 mg (has no administration in time range)  hydrocortisone sodium succinate (SOLU-CORTEF) 100 MG injection 200 mg (200 mg Intravenous Given 05/20/21 2155)    ED Course  I have reviewed the triage vital signs and the  nursing notes.  Pertinent labs & imaging results that were available during my care of the patient were reviewed by me and considered in my medical decision making (see chart for details).    MDM Rules/Calculators/A&P  Ultrasound done here in the ER shows findings consistent with age-indeterminate DVT of the right proximal femoral vein.  Patient appears to have formed a DVT despite being on Eliquis 5 mg twice daily.  Consultation requested with hospitalist team.    Final Clinical Impression(s) / ED Diagnoses Final diagnoses:  Deep vein thrombosis (DVT) of femoral vein of right lower extremity, unspecified chronicity [facility])    Rx / DC Orders ED Discharge Orders     None        Luna Fuse, MD 05/21/21 [phone]

## 2021-05-20 NOTE — ED Provider Notes (Addendum)
Emergency Medicine Provider Triage Evaluation Note  Edgar Thomas , a 87 y.o. male  was evaluated in triage.  Pt complains of right leg cramping for 1 day. Patient recently seen and discharged after admission for weakness yesterday. Is on eliquis for afib, had discontinued while admitted but is taking it at this time. Denies any pain during my evaluation.  Review of Systems  Positive: Right leg pain Negative: Leg swelling, chest pain, shortness of breath, hemoptysis  Physical Exam  BP 111/85 (BP Location: Left Arm)    Pulse 91    Temp 98.4 F (36.9 C)    Resp 14    SpO2 97%  Gen:   Awake, no distress   Resp:  Normal effort  MSK:   Moves extremities without difficulty  Other:  Some TTP RLE, no redness, swelling  Medical Decision Making  Medically screening exam initiated at 5:03 PM.  Appropriate orders placed.  Edgar Thomas was informed that the remainder of the evaluation will be completed by another provider, this initial triage assessment does not replace that evaluation, and the importance of remaining in the ED until their evaluation is complete.  DVT r/o   Edgar Thomas 05/20/21 1706  DVT positive from [place], questionable age, possible SOB endorsed, will initiate labs, CTA PE study -- needs contrast ppx   Edgar Thomas 05/20/21 1851    Edgar Hong, MD 05/20/21 [phone]

## 2021-05-20 NOTE — ED Triage Notes (Signed)
Pt here to see if he has a blood clot in R leg. Pt was discharged from hospital yesterday, has been having leg cramps since last night. Family friend who is a PCP came to see him last night and stated he should be evaluated for a possible DVT.

## 2021-05-21 ENCOUNTER — Observation Stay (HOSPITAL_COMMUNITY): Payer: Medicare Other

## 2021-05-21 ENCOUNTER — Encounter (HOSPITAL_COMMUNITY): Payer: Self-pay | Admitting: Internal Medicine

## 2021-05-21 DIAGNOSIS — R29818 Other symptoms and signs involving the nervous system: Secondary | ICD-10-CM | POA: Diagnosis not present

## 2021-05-21 DIAGNOSIS — I82411 Acute embolism and thrombosis of right femoral vein: Secondary | ICD-10-CM

## 2021-05-21 DIAGNOSIS — M47816 Spondylosis without myelopathy or radiculopathy, lumbar region: Secondary | ICD-10-CM | POA: Diagnosis not present

## 2021-05-21 DIAGNOSIS — I82409 Acute embolism and thrombosis of unspecified deep veins of unspecified lower extremity: Secondary | ICD-10-CM | POA: Diagnosis present

## 2021-05-21 DIAGNOSIS — Z951 Presence of aortocoronary bypass graft: Secondary | ICD-10-CM

## 2021-05-21 DIAGNOSIS — I2510 Atherosclerotic heart disease of native coronary artery without angina pectoris: Secondary | ICD-10-CM

## 2021-05-21 DIAGNOSIS — I5042 Chronic combined systolic (congestive) and diastolic (congestive) heart failure: Secondary | ICD-10-CM

## 2021-05-21 DIAGNOSIS — I10 Essential (primary) hypertension: Secondary | ICD-10-CM

## 2021-05-21 DIAGNOSIS — M160 Bilateral primary osteoarthritis of hip: Secondary | ICD-10-CM | POA: Diagnosis not present

## 2021-05-21 DIAGNOSIS — I824Y1 Acute embolism and thrombosis of unspecified deep veins of right proximal lower extremity: Secondary | ICD-10-CM

## 2021-05-21 DIAGNOSIS — R252 Cramp and spasm: Secondary | ICD-10-CM | POA: Diagnosis not present

## 2021-05-21 DIAGNOSIS — R0602 Shortness of breath: Secondary | ICD-10-CM | POA: Diagnosis not present

## 2021-05-21 LAB — BASIC METABOLIC PANEL
Anion gap: 9 (ref 5–15)
BUN: 16 mg/dL (ref 8–23)
CO2: 25 mmol/L (ref 22–32)
Calcium: 8.5 mg/dL — ABNORMAL LOW (ref 8.9–10.3)
Chloride: 102 mmol/L (ref 98–111)
Creatinine, Ser: 0.95 mg/dL (ref 0.61–1.24)
GFR, Estimated: >60 mL/min (ref >60)
Glucose, Bld: 126 mg/dL — ABNORMAL HIGH (ref 70–99)
Potassium: 4.1 mmol/L (ref 3.5–5.1)
Sodium: 136 mmol/L (ref 135–145)

## 2021-05-21 LAB — CBC
HCT: 40.8 % (ref 39.0–52.0)
Hemoglobin: 13.7 g/dL (ref 13.0–17.0)
MCH: 32.4 pg (ref 26.0–34.0)
MCHC: 33.6 g/dL (ref 30.0–36.0)
MCV: 96.5 fL (ref 80.0–100.0)
Platelets: 194 10*3/uL (ref 150–400)
RBC: 4.23 MIL/uL (ref 4.22–5.81)
RDW: 13.5 % (ref 11.5–15.5)
WBC: 4.1 10*3/uL (ref 4.0–10.5)
nRBC: 0 % (ref 0.0–0.2)

## 2021-05-21 LAB — RESP PANEL BY RT-PCR (FLU A&B, COVID) ARPGX2
Influenza A by PCR: POSITIVE — AB
Influenza B by PCR: NEGATIVE
SARS Coronavirus 2 by RT PCR: NEGATIVE

## 2021-05-21 MED ORDER — CEPHALEXIN 250 MG PO CAPS
500.0000 mg | ORAL_CAPSULE | Freq: Three times a day (TID) | ORAL | Status: AC
  Administered 2021-05-21 – 2021-05-22 (×6): 500 mg via ORAL
  Filled 2021-05-21 (×7): qty 2

## 2021-05-21 MED ORDER — IOHEXOL 350 MG/ML SOLN
80.0000 mL | Freq: Once | INTRAVENOUS | Status: AC | PRN
  Administered 2021-05-21: 03:00:00 80 mL via INTRAVENOUS

## 2021-05-21 MED ORDER — FUROSEMIDE 20 MG PO TABS
20.0000 mg | ORAL_TABLET | Freq: Every day | ORAL | Status: DC
  Administered 2021-05-21 – 2021-05-23 (×3): 20 mg via ORAL
  Filled 2021-05-21 (×3): qty 1

## 2021-05-21 MED ORDER — METOPROLOL TARTRATE 25 MG PO TABS
37.5000 mg | ORAL_TABLET | Freq: Two times a day (BID) | ORAL | Status: DC
  Administered 2021-05-21 – 2021-05-23 (×6): 37.5 mg via ORAL
  Filled 2021-05-21 (×3): qty 1
  Filled 2021-05-21: qty 2
  Filled 2021-05-21 (×2): qty 1

## 2021-05-21 MED ORDER — ACETAMINOPHEN 325 MG PO TABS
650.0000 mg | ORAL_TABLET | Freq: Four times a day (QID) | ORAL | Status: DC | PRN
  Administered 2021-05-21 – 2021-05-22 (×2): 650 mg via ORAL
  Filled 2021-05-21: qty 2

## 2021-05-21 MED ORDER — OSELTAMIVIR PHOSPHATE 30 MG PO CAPS
30.0000 mg | ORAL_CAPSULE | Freq: Two times a day (BID) | ORAL | Status: AC
  Administered 2021-05-21 – 2021-05-22 (×4): 30 mg via ORAL
  Filled 2021-05-21 (×6): qty 1

## 2021-05-21 MED ORDER — ENOXAPARIN SODIUM 80 MG/0.8ML IJ SOSY
65.0000 mg | PREFILLED_SYRINGE | Freq: Two times a day (BID) | INTRAMUSCULAR | Status: DC
  Administered 2021-05-21 – 2021-05-22 (×3): 65 mg via SUBCUTANEOUS
  Filled 2021-05-21 (×2): qty 0.8
  Filled 2021-05-21: qty 0.65
  Filled 2021-05-21: qty 0.8

## 2021-05-21 MED ORDER — ACETAMINOPHEN 650 MG RE SUPP: 650.0000 mg | Freq: Four times a day (QID) | RECTAL | Status: DC | PRN

## 2021-05-21 MED ORDER — DOFETILIDE 125 MCG PO CAPS
125.0000 ug | ORAL_CAPSULE | Freq: Two times a day (BID) | ORAL | Status: DC
  Administered 2021-05-21 – 2021-05-23 (×6): 125 ug via ORAL
  Filled 2021-05-21 (×7): qty 1

## 2021-05-21 MED ORDER — ENOXAPARIN SODIUM 80 MG/0.8ML IJ SOSY
1.0000 mg/kg | PREFILLED_SYRINGE | Freq: Once | INTRAMUSCULAR | Status: AC
  Administered 2021-05-21: 01:00:00 65 mg via SUBCUTANEOUS
  Filled 2021-05-21: qty 0.65

## 2021-05-21 MED ORDER — FERROUS SULFATE 325 (65 FE) MG PO TABS
325.0000 mg | ORAL_TABLET | Freq: Every day | ORAL | Status: DC
  Administered 2021-05-21 – 2021-05-23 (×3): 325 mg via ORAL
  Filled 2021-05-21 (×3): qty 1

## 2021-05-21 MED ORDER — DILTIAZEM HCL ER COATED BEADS 120 MG PO CP24
120.0000 mg | ORAL_CAPSULE | Freq: Every morning | ORAL | Status: DC
  Administered 2021-05-21 – 2021-05-23 (×3): 120 mg via ORAL
  Filled 2021-05-21 (×4): qty 1

## 2021-05-21 MED ORDER — NITROGLYCERIN 0.4 MG SL SUBL: 0.4000 mg | SUBLINGUAL_TABLET | SUBLINGUAL | Status: DC | PRN

## 2021-05-21 MED ORDER — SODIUM CHLORIDE 0.9 % IV SOLN: Freq: Once | INTRAVENOUS | Status: AC

## 2021-05-21 MED ORDER — ROSUVASTATIN CALCIUM 20 MG PO TABS
20.0000 mg | ORAL_TABLET | Freq: Every morning | ORAL | Status: DC
  Administered 2021-05-21 – 2021-05-23 (×3): 20 mg via ORAL
  Filled 2021-05-21 (×3): qty 1

## 2021-05-21 NOTE — Progress Notes (Signed)
ANTICOAGULATION CONSULT NOTE - Initial Consult  Pharmacy Consult for Lovenox Indication: DVT  Allergies  Allergen Reactions   Contrast Media [Iodinated Contrast Media] Rash    [facility] years [facility] at [facility]   Metrizamide Rash    19 years ago at [facility] (Amipaque)   Other Other (See Comments)    Patient preference: NO MEAT OR DAIRY!!   Amiodarone Other (See Comments)    Makes the patient walk sideways and he falls   Ciprofloxacin Other (See Comments)    Was told by MD to "not take"   Tramadol Nausea And Vomiting    Patient Measurements:   Ht 70 in Wt 65.8 kg  Vital Signs: Temp: 98.4 F (36.9 C) (12/23 1626) BP: 156/99 (12/24 0115) Pulse Rate: 75 (12/24 0115)  Labs: Recent Labs    05/18/21 0214 05/19/21 0220 05/20/21 1905  HGB 15.1 13.7 14.0  HCT 46.3 41.5 42.3  PLT 252 215 214  APTT  --   --  35  LABPROT  --   --  17.8*  INR  --   --  1.5*  CREATININE 0.96 0.84 0.91  TROPONINIHS 51*  --   --     Estimated Creatinine Clearance: 51.2 mL/min (by C-G formula based on SCr of 0.91 mg/dL).   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 M presents with R leg swelling. Found to have age-indeterminate DVT. Pt on Eliquis PTA for afib (last dose 12/23 1030). He and his daughter state that he is compliant - there was some time period when Eliquis was held for a planned lithotripsy but couldn't ascertain exactly when this was but prior to 12/6. Pt at [facility] [place] from 12/6-12/13 and  received Eliquis this entire time and has continued since then. Since pt developed clot on Eliquis - plan to use Lovenox for treatment.  Goal of Therapy:  Anti-Xa level 0.6-1 units/ml 4hrs after LMWH dose given Monitor platelets by anticoagulation protocol: Yes   Plan:  Lovenox 65 mg SQ q12h CBC q72h while on Lovenox F/u plans for longterm oral AC   Christoper Fabian, PharmD, BCPS Please see amion for complete clinical pharmacist phone list 05/21/2021,1:54 AM

## 2021-05-21 NOTE — ED Notes (Signed)
Breakfast tray ordered 

## 2021-05-21 NOTE — ED Notes (Signed)
Admitting RN contacted.

## 2021-05-21 NOTE — ED Notes (Signed)
Pt transported to MRI. Wife accompanying.

## 2021-05-21 NOTE — ED Notes (Signed)
CT made aware of receipt of Benadryl and that pt will be ready to scan at 0200

## 2021-05-21 NOTE — Progress Notes (Signed)
PROGRESS NOTE    Edgar Thomas  [id number] DOB: [birth year] DOA: 05/20/2021 PCP: [name], MD   Brief Narrative:   Edgar Thomas is a 87 y.o. male with history of stroke with difficulty speaking, atrial fibrillation, chronic systolic CHF, CAD status post CABG, hyperlipidemia and hypertension was recently admitted to the hospital for acute metabolic encephalopathy and was found to be flu positive and also UTI discharged home yesterday has been experiencing increasing crampy right lower extremity pain and patient's family friend who is a physician advised to come to the ER to check out for DVT.  Patient has been having productive cough prior to the admission last time.  Denies any chest pain or shortness of breath.   ED Course: In the ER patient's Dopplers showed right lower extremity femoral vein and tibial vein DVT age-indeterminate.  This is despite patient being on Eliquis.  So patient was started on Lovenox.  On exam patient also has difficulty moving his right lower extremity which as per the patient's wife is new.  Labs are largely at baseline.  Patient admitted for further management of DVT despite being on Eliquis and also right leg weakness.    Assessment & Plan:  Right lower extremity DVT: -On Eliquis at home.  Doppler ultrasound shows age-indeterminate DVT in right lower extremity.  Started on Lovenox. -X-ray of right hip shows degenerative changes.  No fracture. -CTA chest negative for PE -Monitor H&H.  Right lower extremity weakness exam improved -MRI brain shows no acute intracranial abnormality.  Old infarct of the left thalamus and right basal ganglia.  Chronic small vessel ischemia. -Consult PT/OT -Fall precautions.  X-rays negative for fracture  Chronic combined systolic and diastolic CHF with ejection fraction of 40 to 45% in 2015: Patient appears to be dehydrated on exam.  Continue Lasix, lisinopril plan.  Continue strict INO's and daily weight.  Monitor kidney  function closely.  Monitor electrolytes.  Atrial fibrillation: Rate controlled. -Continue metoprolol, Tikosyn and Cardizem.  Eliquis switched to Lovenox due to DVT.  Coronary artery disease status post CABG: Patient denies any ACS symptoms  Bilateral multifocal pneumonia due to flu A: -Continue Tamiflu  Aortic atherosclerosis: Noted on CT chest  Dilatation of ascending aorta: 4.3 cm in size.  Recommended annual imaging follow-up by CTA or MRA. -Follow-up with PCP outpatient.  UTI: -Continue Keflex.  Urine culture shows multiple species.  DVT prophylaxis: Lovenox Code Status: DNR Family Communication: Patient's wife present at bedside.  Plan of care discussed with patient in length and he verbalized understanding and agreed with it. Disposition Plan: To be determined  Consultants:  None  Procedures:  None  Antimicrobials:  Keflex  Status is: Observation    Subjective: Patient seen and examined in ED.  Wife at the bedside.  Patient is resting comfortably on the bed.  Appears dehydrated.  Alert and following commands.  Denies any pain, fever, chills, headache, blurry vision, chest pain or shortness of breath.  Objective: Vitals:   05/21/21 0810 05/21/21 0811 05/21/21 0815 05/21/21 1000  BP:  (!) 145/77 (!) 159/92 (!) 184/86  Pulse:  63  68  Resp:  16 15 16   Temp: 97.6 F (36.4 C)     TempSrc: Oral     SpO2:  97%  97%   No intake or output data in the 24 hours ending 05/21/21 1435 There were no vitals filed for this visit.  Examination:  General exam: Appears calm and comfortable, elderly male, lying comfortably on the bed, wife  at the bedside.  Appears dehydrated.   Respiratory system: Clear to auscultation. Respiratory effort normal. Cardiovascular system: S1 & S2 heard, RRR. No JVD, murmurs, rubs, gallops or clicks. No pedal edema. Gastrointestinal system: Abdomen is nondistended, soft and nontender. No organomegaly or masses felt. Normal bowel sounds  heard. Central nervous system: Alert and following commands.  Power 5 out of 5 in bilateral upper extremities, 4 out of 5 in bilateral lower extremities.  Sensation intact.  No facial asymmetry.  No slurred speech  Skin: No rashes, lesions or ulcers Psychiatry: Judgement and insight appear normal. Mood & affect appropriate.    Data Reviewed: I have personally reviewed following labs and imaging studies  CBC: Recent Labs  Lab 05/17/21 1540 05/18/21 0214 05/19/21 0220 05/20/21 1905 05/21/21 0550  WBC 8.0 5.8 3.9* 6.8 4.1  NEUTROABS 5.3 3.3  --  5.0  --   HGB 14.1 15.1 13.7 14.0 13.7  HCT 43.4 46.3 41.5 42.3 40.8  MCV 98.9 98.5 96.5 97.5 96.5  PLT 265 252 215 214 [id number]   Basic Metabolic Panel: Recent Labs  Lab 05/17/21 1540 05/18/21 0214 05/19/21 0220 05/20/21 1905 05/21/21 0550  NA 138 135 134* 135 136  K 3.9 3.6 3.7 4.4 4.1  CL 102 102 103 103 102  CO2 27 24 24 25 25   GLUCOSE 113* 94 88 105* 126*  BUN 22 23 18 16 16   CREATININE 1.15 0.96 0.84 0.91 0.95  CALCIUM 8.7* 8.7* 8.2* 8.8* 8.5*  MG  --  2.2   2.2 1.9  --   --    GFR: Estimated Creatinine Clearance: 49.1 mL/min (by C-G formula based on SCr of 0.95 mg/dL). Liver Function Tests: Recent Labs  Lab 05/17/21 1540 05/18/21 0214  AST 45* 43*  ALT 31 30  ALKPHOS 105 103  BILITOT 0.7 0.8  PROT 7.2 7.5  ALBUMIN 3.2* 3.2*   No results for input(s): LIPASE, AMYLASE in the last 168 hours. Recent Labs  Lab 05/17/21 1905  AMMONIA <10   Coagulation Profile: Recent Labs  Lab 05/20/21 1905  INR 1.5*   Cardiac Enzymes: No results for input(s): CKTOTAL, CKMB, CKMBINDEX, TROPONINI in the last 168 hours. BNP (last 3 results) No results for input(s): PROBNP in the last 8760 hours. HbA1C: No results for input(s): HGBA1C in the last 72 hours. CBG: No results for input(s): GLUCAP in the last 168 hours. Lipid Profile: No results for input(s): CHOL, HDL, LDLCALC, TRIG, CHOLHDL, LDLDIRECT in the last 72  hours. Thyroid Function Tests: No results for input(s): TSH, T4TOTAL, FREET4, T3FREE, THYROIDAB in the last 72 hours. Anemia Panel: No results for input(s): VITAMINB12, FOLATE, FERRITIN, TIBC, IRON, RETICCTPCT in the last 72 hours. Sepsis Labs: Recent Labs  Lab 05/18/21 0214  PROCALCITON <0.10    Recent Results (from the past 240 hour(s))  Urine Culture     Status: Abnormal   Collection Time: 05/17/21  6:36 PM   Specimen: Urine, Clean Catch  Result Value Ref Range Status   Specimen Description URINE, CLEAN CATCH  Final   Special Requests   Final    NONE Performed at [facility], [address], [place], [place] [address]    Culture MULTIPLE SPECIES PRESENT, SUGGEST RECOLLECTION (A)  Final   Report Status 05/19/2021 FINAL  Final  Resp Panel by RT-PCR (Flu A&B, Covid) Nasopharyngeal Swab     Status: Abnormal   Collection Time: 05/17/21  8:08 PM   Specimen: Nasopharyngeal Swab; Nasopharyngeal(NP) swabs in vial transport  medium  Result Value Ref Range Status   SARS Coronavirus 2 by RT PCR NEGATIVE NEGATIVE Final    Comment: (NOTE) SARS-CoV-2 target nucleic acids are NOT DETECTED.  The SARS-CoV-2 RNA is generally detectable in upper respiratory specimens during the acute phase of infection. The lowest concentration of SARS-CoV-2 viral copies this assay can detect is 138 copies/mL. A negative result does not preclude SARS-Cov-2 infection and should not be used as the sole basis for treatment or other patient management decisions. A negative result may occur with  improper specimen collection/handling, submission of specimen other than nasopharyngeal swab, presence of viral mutation(s) within the areas targeted by this assay, and inadequate number of viral copies(<138 copies/mL). A negative result must be combined with clinical observations, patient history, and epidemiological information. The expected result is Negative.  Fact Sheet for Patients:   EntrepreneurPulse.com.au  Fact Sheet for Healthcare Providers:  IncredibleEmployment.be  This test is no t yet approved or cleared by the [place] FDA and  has been authorized for detection and/or diagnosis of SARS-CoV-2 by FDA under an Emergency Use Authorization (EUA). This EUA will remain  in effect (meaning this test can be used) for the duration of the COVID-19 declaration under Section 564(b)(1) of the Act, 21 U.S.C.section 360bbb-3(b)(1), unless the authorization is terminated  or revoked sooner.       Influenza A by PCR POSITIVE (A) NEGATIVE Final   Influenza B by PCR NEGATIVE NEGATIVE Final    Comment: (NOTE) The Xpert Xpress SARS-CoV-2/FLU/RSV plus assay is intended as an aid in the diagnosis of influenza from Nasopharyngeal swab specimens and should not be used as a sole basis for treatment. Nasal washings and aspirates are unacceptable for Xpert Xpress SARS-CoV-2/FLU/RSV testing.  Fact Sheet for Patients: EntrepreneurPulse.com.au  Fact Sheet for Healthcare Providers: IncredibleEmployment.be  This test is not yet approved or cleared by the [place] FDA and has been authorized for detection and/or diagnosis of SARS-CoV-2 by FDA under an Emergency Use Authorization (EUA). This EUA will remain in effect (meaning this test can be used) for the duration of the COVID-19 declaration under Section 564(b)(1) of the Act, 21 U.S.C. section 360bbb-3(b)(1), unless the authorization is terminated or revoked.  Performed at [facility], [place] [address] [place], [place] [address]   Culture, blood (routine x 2)     Status: None (Preliminary result)   Collection Time: 05/18/21  2:26 AM   Specimen: BLOOD  Result Value Ref Range Status   Specimen Description BLOOD LEFT ANTECUBITAL  Final   Special Requests   Final    BOTTLES DRAWN AEROBIC AND ANAEROBIC Blood Culture adequate volume   Culture    Final    NO GROWTH 3 DAYS Performed at [facility], [place] [address] [place], [place] [address]    Report Status PENDING  Incomplete  Culture, blood (routine x 2)     Status: None (Preliminary result)   Collection Time: 05/18/21  2:31 AM   Specimen: BLOOD RIGHT HAND  Result Value Ref Range Status   Specimen Description BLOOD RIGHT HAND  Final   Special Requests   Final    BOTTLES DRAWN AEROBIC AND ANAEROBIC Blood Culture results may not be optimal due to an inadequate volume of blood received in culture bottles   Culture   Final    NO GROWTH 3 DAYS Performed at [facility], [place] [address] [place], [place] [address]    Report Status PENDING  Incomplete  Resp  Panel by RT-PCR (Flu A&B, Covid) Nasopharyngeal Swab     Status: Abnormal   Collection Time: 05/21/21 12:52 AM   Specimen: Nasopharyngeal Swab; Nasopharyngeal(NP) swabs in vial transport medium  Result Value Ref Range Status   SARS Coronavirus 2 by RT PCR NEGATIVE NEGATIVE Final    Comment: (NOTE) SARS-CoV-2 target nucleic acids are NOT DETECTED.  The SARS-CoV-2 RNA is generally detectable in upper respiratory specimens during the acute phase of infection. The lowest concentration of SARS-CoV-2 viral copies this assay can detect is 138 copies/mL. A negative result does not preclude SARS-Cov-2 infection and should not be used as the sole basis for treatment or other patient management decisions. A negative result may occur with  improper specimen collection/handling, submission of specimen other than nasopharyngeal swab, presence of viral mutation(s) within the areas targeted by this assay, and inadequate number of viral copies(<138 copies/mL). A negative result must be combined with clinical observations, patient history, and epidemiological information. The expected result is Negative.  Fact Sheet for Patients:  BloggerCourse.com  Fact Sheet for Healthcare Providers:   SeriousBroker.it  This test is no t yet approved or cleared by the [place] FDA and  has been authorized for detection and/or diagnosis of SARS-CoV-2 by FDA under an Emergency Use Authorization (EUA). This EUA will remain  in effect (meaning this test can be used) for the duration of the COVID-19 declaration under Section 564(b)(1) of the Act, 21 U.S.C.section 360bbb-3(b)(1), unless the authorization is terminated  or revoked sooner.       Influenza A by PCR POSITIVE (A) NEGATIVE Final   Influenza B by PCR NEGATIVE NEGATIVE Final    Comment: (NOTE) The Xpert Xpress SARS-CoV-2/FLU/RSV plus assay is intended as an aid in the diagnosis of influenza from Nasopharyngeal swab specimens and should not be used as a sole basis for treatment. Nasal washings and aspirates are unacceptable for Xpert Xpress SARS-CoV-2/FLU/RSV testing.  Fact Sheet for Patients: BloggerCourse.com  Fact Sheet for Healthcare Providers: SeriousBroker.it  This test is not yet approved or cleared by the [place] FDA and has been authorized for detection and/or diagnosis of SARS-CoV-2 by FDA under an Emergency Use Authorization (EUA). This EUA will remain in effect (meaning this test can be used) for the duration of the COVID-19 declaration under Section 564(b)(1) of the Act, 21 U.S.C. section 360bbb-3(b)(1), unless the authorization is terminated or revoked.  Performed at [facility], [address], [place], [place] [address]       Radiology Studies: DG Pelvis 1-2 Views  Result Date: 05/21/2021 CLINICAL DATA:  Leg cramping. EXAM: PELVIS - 1-2 VIEW COMPARISON:  None. FINDINGS: There is no evidence of pelvic fracture or diastasis. No pelvic bone lesions are seen. Degenerative changes seen involving the bilateral hips in the form of joint space narrowing and acetabular sclerosis. Additional degenerative changes are  seen within the visualized portion of the lower lumbar spine. Radiopaque contrast is seen throughout the lumen of the urinary bladder. IMPRESSION: Degenerative changes without an acute osseous abnormality. Electronically Signed   By: Aram Candela M.D.   On: 05/21/2021 04:12   CT Angio Chest Pulmonary Embolism (PE) W or WO Contrast  Result Date: 05/21/2021 CLINICAL DATA:  History of known right leg DVT with shortness of breath, initial encounter EXAM: CT ANGIOGRAPHY CHEST WITH CONTRAST TECHNIQUE: Multidetector CT imaging of the chest was performed using the standard protocol during bolus administration of intravenous contrast. Multiplanar CT image reconstructions and MIPs were obtained to evaluate the  vascular anatomy. CONTRAST:  47mL OMNIPAQUE IOHEXOL 350 MG/ML SOLN COMPARISON:  05/17/2021, 05/02/2021 FINDINGS: Cardiovascular: Atherosclerotic calcifications of the thoracic aorta are noted. Ascending aorta is aneurysmally dilated at 4.3 cm. Normal tapering in the aortic arch is seen. Coronary calcifications are noted. Cardiac enlargement is noted. Pulmonary artery is well visualized within normal enhancement pattern. No filling defect to suggest pulmonary embolism is identified. Mediastinum/Nodes: Thoracic inlet is within normal limits. No sizable hilar or mediastinal adenopathy is noted. The esophagus as visualized is within normal limits. Lungs/Pleura: Lungs are well aerated bilaterally. Patchy airspace opacities are noted in the lower lobes as well as in the lingula and upper lobes worst on the right. No sizable effusion is seen. No parenchymal nodules are noted. Upper Abdomen: Visualized upper abdomen demonstrates hepatic cysts similar to that seen on prior exams. Musculoskeletal: No acute rib abnormality is noted. Mild degenerative changes of the thoracic spine noted. Review of the MIP images confirms the above findings. IMPRESSION: No evidence of pulmonary emboli. Patchy airspace opacities noted  bilaterally consistent with multifocal pneumonia. Dilatation of the ascending aorta to 4.3 cm. Recommend annual imaging followup by CTA or MRA. This recommendation follows 2010 ACCF/AHA/AATS/ACR/ASA/SCA/SCAI/SIR/STS/SVM Guidelines for the Diagnosis and Management of Patients with Thoracic Aortic Disease. Circulation. 2010; [id number]. Aortic aneurysm NOS (ICD10-I71.9) No other focal abnormality is noted. Aortic Atherosclerosis (ICD10-I70.0). Electronically Signed   By: Inez Catalina M.D.   On: 05/21/2021 02:56   MR BRAIN WO CONTRAST  Result Date: 05/21/2021 CLINICAL DATA:  Acute neurologic deficit EXAM: MRI HEAD WITHOUT CONTRAST TECHNIQUE: Multiplanar, multiecho pulse sequences of the brain and surrounding structures were obtained without intravenous contrast. COMPARISON:  None. FINDINGS: Brain: No acute infarct, mass effect or extra-axial collection. No acute or chronic hemorrhage. Confluent hyperintense T2-weighted white matter signal. Generalized volume loss without a clear lobar predilection. Old infarcts of the left thalamus and right basal ganglia. The midline structures are normal. Vascular: Major flow voids are preserved. Skull and upper cervical spine: Normal calvarium and skull base. Visualized upper cervical spine and soft tissues are normal. Sinuses/Orbits:No paranasal sinus fluid levels or advanced mucosal thickening. No mastoid or middle ear effusion. Normal orbits. IMPRESSION: 1. No acute intracranial abnormality. 2. Old infarcts of the left thalamus and right basal ganglia. 3. Confluent hyperintense T2-weighted white matter signal, consistent with chronic small vessel ischemia. Electronically Signed   By: Ulyses Jarred M.D.   On: 05/21/2021 03:52   VAS [place] LOWER EXTREMITY VENOUS (DVT) (7a-7p)  Result Date: 05/20/2021  Lower Venous DVT Study Patient Name:  ZERIK HARBAUGH  Date of Exam:   05/20/2021 Medical Rec #: [id number]     Accession #:    [id number] Date of Birth: 06/13/1929      Patient  Gender: M Patient Age:   33 years Exam Location[facility]  [facility] Procedure:      VAS [place] LOWER EXTREMITY VENOUS (DVT) Referring Phys: CHRISTIAN PROSPERI --------------------------------------------------------------------------------  Indications: Pain and cramping in right lower extremity.  Limitations: Patient position. Comparison Study: No prior study Performing Technologist: Maudry Mayhew MHA, RDMS, RVT, RDCS  Examination Guidelines: A complete evaluation includes B-mode imaging, spectral Doppler, color Doppler, and power Doppler as needed of all accessible portions of each vessel. Bilateral testing is considered an integral part of a complete examination. Limited examinations for reoccurring indications may be performed as noted. The reflux portion of the exam is performed with the patient in reverse Trendelenburg.  +---------+---------------+---------+-----------+----------+-----------------+  RIGHT     Compressibility Phasicity Spontaneity Properties  Thrombus Aging     +---------+---------------+---------+-----------+----------+-----------------+  CFV       Full            Yes       Yes                                       +---------+---------------+---------+-----------+----------+-----------------+  SFJ       Full                                                                +---------+---------------+---------+-----------+----------+-----------------+  FV Prox   Partial                   No                     Age Indeterminate  +---------+---------------+---------+-----------+----------+-----------------+  FV Mid    Full                                                                +---------+---------------+---------+-----------+----------+-----------------+  FV Distal Full                                                                +---------+---------------+---------+-----------+----------+-----------------+  PFV       Full                                                                 +---------+---------------+---------+-----------+----------+-----------------+  POP       Full                                                                +---------+---------------+---------+-----------+----------+-----------------+  PTV       None                      No                     Age Indeterminate  +---------+---------------+---------+-----------+----------+-----------------+   Right Technical Findings: Not visualized segments include peroneal veins.  Left Technical Findings: Not visualized segments include CFV.   Summary: RIGHT: - Findings consistent with age indeterminate deep vein thrombosis involving the right proximal femoral vein, and right posterior tibial veins. - No cystic structure found in the popliteal fossa.   *See table(s) above for measurements and observations.  Preliminary     Scheduled Meds:  cephALEXin  500 mg Oral Q8H   diltiazem  120 mg Oral q morning   dofetilide  125 mcg Oral BID   enoxaparin (LOVENOX) injection  65 mg Subcutaneous Q12H   ferrous sulfate  325 mg Oral Q breakfast   furosemide  20 mg Oral Daily   metoprolol tartrate  37.5 mg Oral BID   oseltamivir  30 mg Oral BID   rosuvastatin  20 mg Oral q morning   Continuous Infusions:   LOS: 0 days   Time spent: 40 minutes    Edgar Thomas Loann Quill, MD [facility]  If 7PM-7AM, please contact night-coverage [email or web address] 05/21/2021, 2:35 PM

## 2021-05-21 NOTE — H&P (Signed)
History and Physical    Edgar Thomas [id number] DOB: 03-26-1930 DOA: 05/20/2021  PCP: [name], MD  Patient coming from: Home.  Chief Complaint: Right leg pain.  HPI: Edgar Thomas is a 87 y.o. male with history of stroke with difficulty speaking, atrial fibrillation, chronic systolic CHF, CAD status post CABG, hyperlipidemia and hypertension was recently admitted to the hospital for acute metabolic encephalopathy and was found to be flu positive and also UTI discharged home yesterday has been experiencing increasing crampy right lower extremity pain and patient's family friend who is a physician advised to come to the ER to check out for DVT.  Patient has been having productive cough prior to the admission last time.  Denies any chest pain or shortness of breath.  ED Course: In the ER patient's Dopplers showed right lower extremity femoral vein and tibial vein DVT age-indeterminate.  This is despite patient being on Eliquis.  So patient was started on Lovenox.  On exam patient also has difficulty moving his right lower extremity which as per the patient's wife is new.  Labs are largely at baseline.  Patient admitted for further management of DVT despite being on Eliquis and also right leg weakness.  Review of Systems: As per HPI, rest all negative.   Past Medical History:  Diagnosis Date   A-fib ([place])    Anemia    CAD (coronary artery disease)    Constipation    GERD (gastroesophageal reflux disease)    Heart attack ([place])    Heartburn    Hyperlipemia    Hypertension    Post herpetic neuralgia    Stroke (cerebrum) ([place]) 11/04/2020   Left lenticulostriate and external capsule   Stroke ([place])    TIA (transient ischemic attack)    Vitamin D deficiency     Past Surgical History:  Procedure Laterality Date   balloon angioplasty of LAD     CARDIOVERSION  2018   a fib   CATARACT EXTRACTION, BILATERAL  1019, 2022   COLONOSCOPY     CORONARY ARTERY BYPASS GRAFT N/A 12/03/2013    Procedure: CORONARY ARTERY BYPASS GRAFTING (CABG) x4 using left internal mammary artery and right greater saphenous vein. LIMA to LAD, sequential SVG to OM 1 & OM 2, SVG to PD;  Surgeon: Grace Isaac, MD;  Location: [facility];  Service: Open Heart Surgery;  Laterality: N/A;   ENDOVEIN HARVEST OF GREATER SAPHENOUS VEIN Right 12/03/2013   Procedure: ENDOVEIN HARVEST OF GREATER SAPHENOUS VEIN;  Surgeon: Grace Isaac, MD;  Location: [facility];  Service: Open Heart Surgery;  Laterality: Right;   INTRAOPERATIVE TRANSESOPHAGEAL ECHOCARDIOGRAM N/A 12/03/2013   Procedure: INTRAOPERATIVE TRANSESOPHAGEAL ECHOCARDIOGRAM;  Surgeon: Grace Isaac, MD;  Location: [facility];  Service: Open Heart Surgery;  Laterality: N/A;     reports that he has never smoked. He has never used smokeless tobacco. He reports that he does not drink alcohol and does not use drugs.  Allergies  Allergen Reactions   Contrast Media [Iodinated Contrast Media] Rash    19 years ago at [facility]   Metrizamide Rash    19 years ago at [facility] (Amip[facility])   Other Other (See Comments)    Patient preference: NO MEAT OR DAIRY!!   Amiodarone Other (See Comments)    Makes the patient walk sideways and he falls   Ciprofloxacin Other (See Comments)    Was told by MD to "not take"   Tramadol Nausea And Vomiting    Family  History  Problem Relation Age of Onset   Heart disease Mother        died at age 61 of heart attack   Leukemia Sister    Heart disease Brother    Hypertension Brother     Prior to Admission medications   Medication Sig Start Date End Date Taking? Authorizing Provider  apixaban (ELIQUIS) 5 MG TABS tablet Take 5 mg by mouth 2 (two) times daily.   Yes [provider]  cephALEXin (KEFLEX) 500 MG capsule Take 1 capsule (500 mg total) by mouth every 8 (eight) hours for 4 days. 05/19/21 05/23/21 Yes Amin, Jeanella Flattery, MD  Cholecalciferol (VITAMIN D-3) 25 MCG (1000 UT) CAPS Take 1,000  Units by mouth daily after breakfast.   Yes [provider]  CRESTOR 20 MG tablet Take 20 mg by mouth every morning.  08/26/14  Yes [provider]  diltiazem (CARDIZEM CD) 120 MG 24 hr capsule Take 120 mg by mouth every morning. 05/10/21  Yes [provider]  dofetilide (TIKOSYN) 125 MCG capsule Take 125 mcg by mouth 2 (two) times daily. 05/20/18  Yes [provider]  ferrous sulfate 325 (65 FE) MG tablet Take 325 mg by mouth daily with breakfast.   Yes [provider]  furosemide (LASIX) 40 MG tablet Take 20 mg by mouth daily. 05/10/21  Yes [provider]  JARDIANCE 10 MG TABS tablet Take 10 mg by mouth every morning. 05/10/21  Yes [provider]  metoprolol tartrate (LOPRESSOR) 25 MG tablet Take 1 & 1/2 tablets (37.5 mg total) by mouth 2 (two) times daily. 05/19/21 06/18/21 Yes Amin, Jeanella Flattery, MD  Multiple Vitamins-Minerals (ONE-A-DAY MENS 50+ ADVANTAGE) TABS Take 1 tablet by mouth daily with breakfast.    Yes [provider]  multivitamin-lutein (OCUVITE-LUTEIN) CAPS capsule Take 1 capsule by mouth daily with breakfast.    Yes [provider]  nitroGLYCERIN (NITROSTAT) 0.4 MG SL tablet Place 0.4 mg under the tongue every 5 (five) minutes as needed for chest pain.   Yes [provider]  oseltamivir (TAMIFLU) 30 MG capsule Take 1 capsule (30 mg total) by mouth 2 (two) times daily for 4 days. 05/19/21 05/23/21 Yes Amin, Jeanella Flattery, MD  guaiFENesin (MUCINEX) 600 MG 12 hr tablet Take 2 tablets (1,200 mg total) by mouth 2 (two) times daily. Patient not taking: Reported on 05/18/2021 08/30/15   Rai, Vernelle Emerald, MD  polyethylene glycol (MIRALAX / GLYCOLAX) packet Take 17 g by mouth daily as needed for moderate constipation. Patient not taking: Reported on 05/18/2021 08/30/15   Mendel Corning, MD    Physical Exam: Constitutional: Moderately built and nourished. Vitals:   05/20/21 1626 05/20/21 1854 05/20/21  2053 05/20/21 2253  BP: 111/85 102/74 (!) 148/93 (!) 146/78  Pulse: 91 77 70 65  Resp: 14 18 18 18   Temp: 98.4 F (36.9 C)     SpO2: 97% 97% 92% 95%   Eyes: Anicteric no pallor. ENMT: No discharge from the ears eyes nose and mouth. Neck: No mass felt.  No neck rigidity. Respiratory: No rhonchi or crepitations. Cardiovascular: S1-S2 heard. Abdomen: Soft nontender bowel sound present. Musculoskeletal: No edema.  Has difficulty moving his right lower extremity. Skin: Chronic skin changes. Neurologic: Alert awake oriented to name and place has difficulty moving his right lower extremity is around 2 x 5 particularly against gravity.  Rest of extremities are 5 x 5.  No facial asymmetry. Psychiatric: Appears normal.  Normal affect.   Labs  on Admission: I have personally reviewed following labs and imaging studies  CBC: Recent Labs  Lab 05/17/21 1540 05/18/21 0214 05/19/21 0220 05/20/21 1905  WBC 8.0 5.8 3.9* 6.8  NEUTROABS 5.3 3.3  --  5.0  HGB 14.1 15.1 13.7 14.0  HCT 43.4 46.3 41.5 42.3  MCV 98.9 98.5 96.5 97.5  PLT 265 252 215 214   Basic Metabolic Panel: Recent Labs  Lab 05/17/21 1540 05/18/21 0214 05/19/21 0220 05/20/21 1905  NA 138 135 134* 135  K 3.9 3.6 3.7 4.4  CL 102 102 103 103  CO2 27 24 24 25   GLUCOSE 113* 94 88 105*  BUN 22 23 18 16   CREATININE 1.15 0.96 0.84 0.91  CALCIUM 8.7* 8.7* 8.2* 8.8*  MG  --  2.2   2.2 1.9  --    GFR: Estimated Creatinine Clearance: 51.2 mL/min (by C-G formula based on SCr of 0.91 mg/dL). Liver Function Tests: Recent Labs  Lab 05/17/21 1540 05/18/21 0214  AST 45* 43*  ALT 31 30  ALKPHOS 105 103  BILITOT 0.7 0.8  PROT 7.2 7.5  ALBUMIN 3.2* 3.2*   No results for input(s): LIPASE, AMYLASE in the last 168 hours. Recent Labs  Lab 05/17/21 1905  AMMONIA <10   Coagulation Profile: Recent Labs  Lab 05/20/21 1905  INR 1.5*   Cardiac Enzymes: No results for input(s): CKTOTAL, CKMB, CKMBINDEX, TROPONINI in the last  168 hours. BNP (last 3 results) No results for input(s): PROBNP in the last 8760 hours. HbA1C: No results for input(s): HGBA1C in the last 72 hours. CBG: No results for input(s): GLUCAP in the last 168 hours. Lipid Profile: No results for input(s): CHOL, HDL, LDLCALC, TRIG, CHOLHDL, LDLDIRECT in the last 72 hours. Thyroid Function Tests: No results for input(s): TSH, T4TOTAL, FREET4, T3FREE, THYROIDAB in the last 72 hours. Anemia Panel: Recent Labs    05/18/21 0214  VITAMINB12 660   Urine analysis:    Component Value Date/Time   COLORURINE YELLOW 05/17/2021 1533   APPEARANCEUR CLEAR 05/17/2021 1533   LABSPEC 1.025 05/17/2021 1533   PHURINE 6.0 05/17/2021 1533   GLUCOSEU >=500 (A) 05/17/2021 1533   HGBUR LARGE (A) 05/17/2021 1533   BILIRUBINUR NEGATIVE 05/17/2021 1533   KETONESUR NEGATIVE 05/17/2021 1533   PROTEINUR NEGATIVE 05/17/2021 1533   UROBILINOGEN 0.2 12/01/2013 1410   NITRITE NEGATIVE 05/17/2021 1533   LEUKOCYTESUR TRACE (A) 05/17/2021 1533   Sepsis Labs: @LABRCNTIP (procalcitonin:4,lacticidven:4) ) Recent Results (from the past 240 hour(s))  Urine Culture     Status: Abnormal   Collection Time: 05/17/21  6:36 PM   Specimen: Urine, Clean Catch  Result Value Ref Range Status   Specimen Description URINE, CLEAN CATCH  Final   Special Requests   Final    NONE Performed at [facility], [address], [place], [facility] [address]    Culture MULTIPLE SPECIES PRESENT, SUGGEST RECOLLECTION (A)  Final   Report Status 05/19/2021 FINAL  Final  Resp Panel by RT-PCR (Flu A&B, Covid) Nasopharyngeal Swab     Status: Abnormal   Collection Time: 05/17/21  8:08 PM   Specimen: Nasopharyngeal Swab; Nasopharyngeal(NP) swabs in vial transport medium  Result Value Ref Range Status   SARS Coronavirus 2 by RT PCR NEGATIVE NEGATIVE Final    Comment: (NOTE) SARS-CoV-2 target nucleic acids are NOT DETECTED.  The SARS-CoV-2 RNA is generally detectable in upper  respiratory specimens during the acute phase of infection. The lowest concentration of SARS-CoV-2 viral copies this assay can  detect is 138 copies/mL. A negative result does not preclude SARS-Cov-2 infection and should not be used as the sole basis for treatment or other patient management decisions. A negative result may occur with  improper specimen collection/handling, submission of specimen other than nasopharyngeal swab, presence of viral mutation(s) within the areas targeted by this assay, and inadequate number of viral copies(<138 copies/mL). A negative result must be combined with clinical observations, patient history, and epidemiological information. The expected result is Negative.  Fact Sheet for Patients:  EntrepreneurPulse.com.au  Fact Sheet for Healthcare Providers:  IncredibleEmployment.be  This test is no t yet approved or cleared by the [place] FDA and  has been authorized for detection and/or diagnosis of SARS-CoV-2 by FDA under an Emergency Use Authorization (EUA). This EUA will remain  in effect (meaning this test can be used) for the duration of the COVID-19 declaration under Section 564(b)(1) of the Act, 21 U.S.C.section 360bbb-3(b)(1), unless the authorization is terminated  or revoked sooner.       Influenza A by PCR POSITIVE (A) NEGATIVE Final   Influenza B by PCR NEGATIVE NEGATIVE Final    Comment: (NOTE) The Xpert Xpress SARS-CoV-2/FLU/RSV plus assay is intended as an aid in the diagnosis of influenza from Nasopharyngeal swab specimens and should not be used as a sole basis for treatment. Nasal washings and aspirates are unacceptable for Xpert Xpress SARS-CoV-2/FLU/RSV testing.  Fact Sheet for Patients: EntrepreneurPulse.com.au  Fact Sheet for Healthcare Providers: IncredibleEmployment.be  This test is not yet approved or cleared by the [place] FDA and has been  authorized for detection and/or diagnosis of SARS-CoV-2 by FDA under an Emergency Use Authorization (EUA). This EUA will remain in effect (meaning this test can be used) for the duration of the COVID-19 declaration under Section 564(b)(1) of the Act, 21 U.S.C. section 360bbb-3(b)(1), unless the authorization is terminated or revoked.  Performed at [facility], [place] [address], [place], [place] [address]   Culture, blood (routine x 2)     Status: None (Preliminary result)   Collection Time: 05/18/21  2:26 AM   Specimen: BLOOD  Result Value Ref Range Status   Specimen Description BLOOD LEFT ANTECUBITAL  Final   Special Requests   Final    BOTTLES DRAWN AEROBIC AND ANAEROBIC Blood Culture adequate volume   Culture   Final    NO GROWTH 2 DAYS Performed at [facility], [place] [address] [place], [place] [address]    Report Status PENDING  Incomplete  Culture, blood (routine x 2)     Status: None (Preliminary result)   Collection Time: 05/18/21  2:31 AM   Specimen: BLOOD RIGHT HAND  Result Value Ref Range Status   Specimen Description BLOOD RIGHT HAND  Final   Special Requests   Final    BOTTLES DRAWN AEROBIC AND ANAEROBIC Blood Culture results may not be optimal due to an inadequate volume of blood received in culture bottles   Culture   Final    NO GROWTH 2 DAYS Performed at [facility], [place] [address] [place], [place] [address]    Report Status PENDING  Incomplete     Radiological Exams on Admission: VAS [place] LOWER EXTREMITY VENOUS (DVT) (7a-7p)  Result Date: 05/20/2021  Lower Venous DVT Study Patient Name:  Edgar Thomas  Date of Exam:   05/20/2021 Medical Rec #: [id number]     Accession #:    [id number] Date of Birth: 24-May-1930      Patient  Gender: M Patient Age:   57 years Exam Location:  [facility] Procedure:      VAS [place] LOWER EXTREMITY VENOUS (DVT) Referring Phys: CHRISTIAN PROSPERI  --------------------------------------------------------------------------------  Indications: Pain and cramping in right lower extremity.  Limitations: Patient position. Comparison Study: No prior study Performing Technologist: Maudry Mayhew MHA, RDMS, RVT, RDCS  Examination Guidelines: A complete evaluation includes B-mode imaging, spectral Doppler, color Doppler, and power Doppler as needed of all accessible portions of each vessel. Bilateral testing is considered an integral part of a complete examination. Limited examinations for reoccurring indications may be performed as noted. The reflux portion of the exam is performed with the patient in reverse Trendelenburg.  +---------+---------------+---------+-----------+----------+-----------------+  RIGHT     Compressibility Phasicity Spontaneity Properties Thrombus Aging     +---------+---------------+---------+-----------+----------+-----------------+  CFV       Full            Yes       Yes                                       +---------+---------------+---------+-----------+----------+-----------------+  SFJ       Full                                                                +---------+---------------+---------+-----------+----------+-----------------+  FV Prox   Partial                   No                     Age Indeterminate  +---------+---------------+---------+-----------+----------+-----------------+  FV Mid    Full                                                                +---------+---------------+---------+-----------+----------+-----------------+  FV Distal Full                                                                +---------+---------------+---------+-----------+----------+-----------------+  PFV       Full                                                                +---------+---------------+---------+-----------+----------+-----------------+  POP       Full                                                                 +---------+---------------+---------+-----------+----------+-----------------+  PTV       None                      No                     Age Indeterminate  +---------+---------------+---------+-----------+----------+-----------------+   Right Technical Findings: Not visualized segments include peroneal veins.  Left Technical Findings: Not visualized segments include CFV.   Summary: RIGHT: - Findings consistent with age indeterminate deep vein thrombosis involving the right proximal femoral vein, and right posterior tibial veins. - No cystic structure found in the popliteal fossa.   *See table(s) above for measurements and observations.    Preliminary       Assessment/Plan Principal Problem:   DVT (deep venous thrombosis) (HCC) Active Problems:   S/P CABG x 4 12/04/13   Coronary artery disease involving native coronary artery of native heart without angina pectoris   Chronic combined systolic (congestive) and diastolic (congestive) heart failure (HCC)   Essential hypertension    Right lower extremity DVT -despite being on Eliquis patient has had DVT which is age-indeterminate.  Change to Lovenox at this time.  We will still monitor.  We will also get an x-ray of the right hip.  Since there was some concern for possible shortness of breath CT angiogram has been ordered which is pending. Right lower extremity weakness unable to move against gravity.  Not sure if is due to pain but given history of prior stroke we will get MRI brain.  Check x-ray of the right hip. Chronic combined systolic and diastolic CHF last EF measured was 40 to 45% in 2015.  Appears compensated on Lasix lisinopril. A. fib presently rate controlled recent increase metoprolol dose also on Tikosyn and Cardizem.  Takes Eliquis but changed to Lovenox now.  See #1. CAD status post CABG denies any chest pain. Recent admission for influenza pneumonia was on Tamiflu which will be continued for the rest of the dose and also was on Keflex  for UTI which will be continued for a total dose.   DVT prophylaxis: Lovenox full dose. Code Status: DNR. Family Communication: Patient's wife. Disposition Plan: Home. Consults called: None. Admission status: Observation.   Rise Patience MD [facility] Pager [phone].  If 7PM-7AM, please contact night-coverage [email or web address] Password [email or web address]  05/21/2021, 1:46 AM

## 2021-05-22 DIAGNOSIS — I2510 Atherosclerotic heart disease of native coronary artery without angina pectoris: Secondary | ICD-10-CM | POA: Diagnosis not present

## 2021-05-22 DIAGNOSIS — I824Y1 Acute embolism and thrombosis of unspecified deep veins of right proximal lower extremity: Secondary | ICD-10-CM | POA: Diagnosis not present

## 2021-05-22 DIAGNOSIS — I10 Essential (primary) hypertension: Secondary | ICD-10-CM | POA: Diagnosis not present

## 2021-05-22 DIAGNOSIS — I5042 Chronic combined systolic (congestive) and diastolic (congestive) heart failure: Secondary | ICD-10-CM | POA: Diagnosis not present

## 2021-05-22 LAB — CBC
HCT: 42.2 % (ref 39.0–52.0)
Hemoglobin: 14.2 g/dL (ref 13.0–17.0)
MCH: 32.1 pg (ref 26.0–34.0)
MCHC: 33.6 g/dL (ref 30.0–36.0)
MCV: 95.5 fL (ref 80.0–100.0)
Platelets: 207 10*3/uL (ref 150–400)
RBC: 4.42 MIL/uL (ref 4.22–5.81)
RDW: 13.3 % (ref 11.5–15.5)
WBC: 13.1 10*3/uL — ABNORMAL HIGH (ref 4.0–10.5)
nRBC: 0 % (ref 0.0–0.2)

## 2021-05-22 LAB — BASIC METABOLIC PANEL
Anion gap: 8 (ref 5–15)
BUN: 24 mg/dL — ABNORMAL HIGH (ref 8–23)
CO2: 24 mmol/L (ref 22–32)
Calcium: 8.5 mg/dL — ABNORMAL LOW (ref 8.9–10.3)
Chloride: 107 mmol/L (ref 98–111)
Creatinine, Ser: 0.95 mg/dL (ref 0.61–1.24)
GFR, Estimated: >60 mL/min (ref >60)
Glucose, Bld: 115 mg/dL — ABNORMAL HIGH (ref 70–99)
Potassium: 3.7 mmol/L (ref 3.5–5.1)
Sodium: 139 mmol/L (ref 135–145)

## 2021-05-22 MED ORDER — POTASSIUM CHLORIDE CRYS ER 20 MEQ PO TBCR
40.0000 meq | EXTENDED_RELEASE_TABLET | Freq: Once | ORAL | Status: AC
  Administered 2021-05-22: 10:00:00 40 meq via ORAL
  Filled 2021-05-22: qty 2

## 2021-05-22 MED ORDER — MAGNESIUM SULFATE 2 GM/50ML IV SOLN
2.0000 g | Freq: Once | INTRAVENOUS | Status: AC
  Administered 2021-05-22: 08:00:00 2 g via INTRAVENOUS
  Filled 2021-05-22: qty 50

## 2021-05-22 MED ORDER — APIXABAN 5 MG PO TABS
5.0000 mg | ORAL_TABLET | Freq: Two times a day (BID) | ORAL | Status: DC
  Administered 2021-05-22 – 2021-05-23 (×2): 5 mg via ORAL
  Filled 2021-05-22 (×2): qty 1

## 2021-05-22 NOTE — Progress Notes (Addendum)
PROGRESS NOTE    Edgar Thomas  [id number] DOB: 1929-11-05 DOA: 05/20/2021 PCP: [name], MD   Brief Narrative:   Edgar Thomas is a 87 y.o. male with history of stroke with difficulty speaking, atrial fibrillation, chronic systolic CHF, CAD status post CABG, hyperlipidemia and hypertension was recently admitted to the hospital for acute metabolic encephalopathy and was found to be flu positive and also UTI discharged home yesterday has been experiencing increasing crampy right lower extremity pain and patient's family friend who is a physician advised to come to the ER to check out for DVT.  Patient has been having productive cough prior to the admission last time.  Denies any chest pain or shortness of breath.   ED Course: In the ER patient's Dopplers showed right lower extremity femoral vein and tibial vein DVT age-indeterminate.  This is despite patient being on Eliquis.  So patient was started on Lovenox.  On exam patient also has difficulty moving his right lower extremity which as per the patient's wife is new.  Labs are largely at baseline.  Patient admitted for further management of DVT despite being on Eliquis and also right leg weakness.    Assessment & Plan:  Right lower extremity DVT: -On Eliquis at home.  Doppler ultrasound shows age-indeterminate DVT in right lower extremity.  Started on Lovenox. -X-ray of right hip shows degenerative changes.  No fracture. -CTA chest negative for PE -I discussed with on-call oncologist Dr. Earvin Hansen recommended continue same anticoagulation which he was on prior to the hospitalization since the Doppler shows age-indeterminate DVT (not acute).  Close follow-up with PCP and cardiology if anything changes. -Pharmacy consult for Eliquis placed  Right lower extremity weakness exam improved -MRI brain shows no acute intracranial abnormality.  Old infarct of the left thalamus and right basal ganglia.  Chronic small vessel ischemia. -Consult  PT/OT -OT recommended home health OT -He is already active with [facility] PT/OT -Fall precautions.  X-rays negative for fracture  Chronic combined systolic and diastolic CHF with ejection fraction of 40 to 45% in 2015: Patient appears to be dehydrated on exam.  Continue Lasix, lisinopril plan.  Continue strict INO's and daily weight.  Monitor kidney function closely.  Monitor electrolytes.  Atrial fibrillation: Rate controlled. -Continue metoprolol, Tikosyn and Cardizem.  Eliquis switched to Lovenox due to DVT. -Place potassium and magnesium to keep potassium above 4 and magnesium above 2  Leukocytosis: WBC trended up to 13.1.  Patient is afebrile -Continue Keflex to finish course for recent UTI and Tamiflu for viral pneumonia  Coronary artery disease status post CABG: Patient denies any ACS symptoms  Bilateral multifocal pneumonia due to flu A: -Continue Tamiflu  Aortic atherosclerosis: Noted on CT chest  Dilatation of ascending aorta: 4.3 cm in size.  Recommended annual imaging follow-up by CTA or MRA. -Follow-up with PCP outpatient.  UTI: -Continue Keflex.  Urine culture shows multiple species.  DVT prophylaxis: Lovenox Code Status: DNR Family Communication: Patient's wife present at bedside.  Plan of care discussed with patient in length and he verbalized understanding and agreed with it. Disposition Plan: To be determined  Consultants:  None  Procedures:  None  Antimicrobials:  Keflex  Status is: Observation    Subjective: Patient seen and examined.  Wife at the bedside.  Patient sitting comfortably on the recliner.  No acute events overnight.  He slept well last night and his cough is improving.  No fever, chills.  He denies any pain in his right lower  extremity.  He is alert and following commands intermittently.  Objective: Vitals:   05/21/21 2019 05/21/21 2149 05/22/21 0031 05/22/21 0624  BP:  138/75 (!) 154/90 (!) 161/96  Pulse: 69 62 68 72   Resp: 19 15 19  (!) 21  Temp:  (!) 97.5 F (36.4 C) 98 F (36.7 C) 98.2 F (36.8 C)  TempSrc:  Oral Oral Oral  SpO2: 96% 96% 95% 93%  Weight:    62 kg    Intake/Output Summary (Last 24 hours) at 05/22/2021 1100 Last data filed at 05/22/2021 0940 Gross per 24 hour  Intake 360 ml  Output --  Net 360 ml   Filed Weights   05/22/21 0624  Weight: 62 kg    Examination:  General exam: Appears calm and comfortable, elderly male, sitting comfortably on the recliner, wife at the bedside.  Slightly dehydrated. Respiratory system: Clear to auscultation. Respiratory effort normal. Cardiovascular system: S1 & S2 heard, RRR. No JVD, murmurs, rubs, gallops or clicks. No pedal edema. Gastrointestinal system: Abdomen is nondistended, soft and nontender. No organomegaly or masses felt. Normal bowel sounds heard. Central nervous system: Alert and following commands.  No facial asymmetry.  No slurred speech  Skin: No rashes, lesions or ulcers Psychiatry: Judgement and insight appear normal. Mood & affect appropriate.    Data Reviewed: I have personally reviewed following labs and imaging studies  CBC: Recent Labs  Lab 05/17/21 1540 05/18/21 0214 05/19/21 0220 05/20/21 1905 05/21/21 0550 05/22/21 0237  WBC 8.0 5.8 3.9* 6.8 4.1 13.1*  NEUTROABS 5.3 3.3  --  5.0  --   --   HGB 14.1 15.1 13.7 14.0 13.7 14.2  HCT 43.4 46.3 41.5 42.3 40.8 42.2  MCV 98.9 98.5 96.5 97.5 96.5 95.5  PLT 265 252 215 214 194 [id number]    Basic Metabolic Panel: Recent Labs  Lab 05/18/21 0214 05/19/21 0220 05/20/21 1905 05/21/21 0550 05/22/21 0237  NA 135 134* 135 136 139  K 3.6 3.7 4.4 4.1 3.7  CL 102 103 103 102 107  CO2 24 24 25 25 24   GLUCOSE 94 88 105* 126* 115*  BUN 23 18 16 16  24*  CREATININE 0.96 0.84 0.91 0.95 0.95  CALCIUM 8.7* 8.2* 8.8* 8.5* 8.5*  MG 2.2   2.2 1.9  --   --   --     GFR: Estimated Creatinine Clearance: 46.2 mL/min (by C-G formula based on SCr of 0.95 mg/dL). Liver Function  Tests: Recent Labs  Lab 05/17/21 1540 05/18/21 0214  AST 45* 43*  ALT 31 30  ALKPHOS 105 103  BILITOT 0.7 0.8  PROT 7.2 7.5  ALBUMIN 3.2* 3.2*    No results for input(s): LIPASE, AMYLASE in the last 168 hours. Recent Labs  Lab 05/17/21 1905  AMMONIA <10    Coagulation Profile: Recent Labs  Lab 05/20/21 1905  INR 1.5*    Cardiac Enzymes: No results for input(s): CKTOTAL, CKMB, CKMBINDEX, TROPONINI in the last 168 hours. BNP (last 3 results) No results for input(s): PROBNP in the last 8760 hours. HbA1C: No results for input(s): HGBA1C in the last 72 hours. CBG: No results for input(s): GLUCAP in the last 168 hours. Lipid Profile: No results for input(s): CHOL, HDL, LDLCALC, TRIG, CHOLHDL, LDLDIRECT in the last 72 hours. Thyroid Function Tests: No results for input(s): TSH, T4TOTAL, FREET4, T3FREE, THYROIDAB in the last 72 hours. Anemia Panel: No results for input(s): VITAMINB12, FOLATE, FERRITIN, TIBC, IRON, RETICCTPCT in the last 72 hours. Sepsis Labs: Recent Labs  Lab 05/18/21 0214  PROCALCITON <0.10     Recent Results (from the past 240 hour(s))  Urine Culture     Status: Abnormal   Collection Time: 05/17/21  6:36 PM   Specimen: Urine, Clean Catch  Result Value Ref Range Status   Specimen Description URINE, CLEAN CATCH  Final   Special Requests   Final    NONE Performed at [facility], [address] [place], [place] [address]    Culture MULTIPLE SPECIES PRESENT, SUGGEST RECOLLECTION (A)  Final   Report Status 05/19/2021 FINAL  Final  Resp Panel by RT-PCR (Flu A&B, Covid) Nasopharyngeal Swab     Status: Abnormal   Collection Time: 05/17/21  8:08 PM   Specimen: Nasopharyngeal Swab; Nasopharyngeal(NP) swabs in vial transport medium  Result Value Ref Range Status   SARS Coronavirus 2 by RT PCR NEGATIVE NEGATIVE Final    Comment: (NOTE) SARS-CoV-2 target nucleic acids are NOT DETECTED.  The SARS-CoV-2 RNA is generally detectable in upper  respiratory specimens during the acute phase of infection. The lowest concentration of SARS-CoV-2 viral copies this assay can detect is 138 copies/mL. A negative result does not preclude SARS-Cov-2 infection and should not be used as the sole basis for treatment or other patient management decisions. A negative result may occur with  improper specimen collection/handling, submission of specimen other than nasopharyngeal swab, presence of viral mutation(s) within the areas targeted by this assay, and inadequate number of viral copies(<138 copies/mL). A negative result must be combined with clinical observations, patient history, and epidemiological information. The expected result is Negative.  Fact Sheet for Patients:  BloggerCourse.comhttps://www.fda.gov/media/152166/download  Fact Sheet for Healthcare Providers:  SeriousBroker.ithttps://www.fda.gov/media/152162/download  This test is no t yet approved or cleared by the [place] FDA and  has been authorized for detection and/or diagnosis of SARS-CoV-2 by FDA under an Emergency Use Authorization (EUA). This EUA will remain  in effect (meaning this test can be used) for the duration of the COVID-19 declaration under Section 564(b)(1) of the Act, 21 U.S.C.section 360bbb-3(b)(1), unless the authorization is terminated  or revoked sooner.       Influenza A by PCR POSITIVE (A) NEGATIVE Final   Influenza B by PCR NEGATIVE NEGATIVE Final    Comment: (NOTE) The Xpert Xpress SARS-CoV-2/FLU/RSV plus assay is intended as an aid in the diagnosis of influenza from Nasopharyngeal swab specimens and should not be used as a sole basis for treatment. Nasal washings and aspirates are unacceptable for Xpert Xpress SARS-CoV-2/FLU/RSV testing.  Fact Sheet for Patients: BloggerCourse.comhttps://www.fda.gov/media/152166/download  Fact Sheet for Healthcare Providers: SeriousBroker.ithttps://www.fda.gov/media/152162/download  This test is not yet approved or cleared by the [place] FDA and has been  authorized for detection and/or diagnosis of SARS-CoV-2 by FDA under an Emergency Use Authorization (EUA). This EUA will remain in effect (meaning this test can be used) for the duration of the COVID-19 declaration under Section 564(b)(1) of the Act, 21 U.S.C. section 360bbb-3(b)(1), unless the authorization is terminated or revoked.  Performed at [facility], [address] [place], [place] [address]   Culture, blood (routine x 2)     Status: None (Preliminary result)   Collection Time: 05/18/21  2:26 AM   Specimen: BLOOD  Result Value Ref Range Status   Specimen Description BLOOD LEFT ANTECUBITAL  Final   Special Requests   Final    BOTTLES DRAWN AEROBIC AND ANAEROBIC Blood Culture adequate volume   Culture   Final    NO GROWTH 3 DAYS  Performed at [facility], [place] [address] [place], [place] [address]    Report Status PENDING  Incomplete  Culture, blood (routine x 2)     Status: None (Preliminary result)   Collection Time: 05/18/21  2:31 AM   Specimen: BLOOD RIGHT HAND  Result Value Ref Range Status   Specimen Description BLOOD RIGHT HAND  Final   Special Requests   Final    BOTTLES DRAWN AEROBIC AND ANAEROBIC Blood Culture results may not be optimal due to an inadequate volume of blood received in culture bottles   Culture   Final    NO GROWTH 3 DAYS Performed at [facility], [place] [address], [place], [place] [address]    Report Status PENDING  Incomplete  Resp Panel by RT-PCR (Flu A&B, Covid) Nasopharyngeal Swab     Status: Abnormal   Collection Time: 05/21/21 12:52 AM   Specimen: Nasopharyngeal Swab; Nasopharyngeal(NP) swabs in vial transport medium  Result Value Ref Range Status   SARS Coronavirus 2 by RT PCR NEGATIVE NEGATIVE Final    Comment: (NOTE) SARS-CoV-2 target nucleic acids are NOT DETECTED.  The SARS-CoV-2 RNA is generally detectable in upper respiratory specimens during the acute phase of infection. The lowest concentration of  SARS-CoV-2 viral copies this assay can detect is 138 copies/mL. A negative result does not preclude SARS-Cov-2 infection and should not be used as the sole basis for treatment or other patient management decisions. A negative result may occur with  improper specimen collection/handling, submission of specimen other than nasopharyngeal swab, presence of viral mutation(s) within the areas targeted by this assay, and inadequate number of viral copies(<138 copies/mL). A negative result must be combined with clinical observations, patient history, and epidemiological information. The expected result is Negative.  Fact Sheet for Patients:  EntrepreneurPulse.com.au  Fact Sheet for Healthcare Providers:  IncredibleEmployment.be  This test is no t yet approved or cleared by the [place] FDA and  has been authorized for detection and/or diagnosis of SARS-CoV-2 by FDA under an Emergency Use Authorization (EUA). This EUA will remain  in effect (meaning this test can be used) for the duration of the COVID-19 declaration under Section 564(b)(1) of the Act, 21 U.S.C.section 360bbb-3(b)(1), unless the authorization is terminated  or revoked sooner.       Influenza A by PCR POSITIVE (A) NEGATIVE Final   Influenza B by PCR NEGATIVE NEGATIVE Final    Comment: (NOTE) The Xpert Xpress SARS-CoV-2/FLU/RSV plus assay is intended as an aid in the diagnosis of influenza from Nasopharyngeal swab specimens and should not be used as a sole basis for treatment. Nasal washings and aspirates are unacceptable for Xpert Xpress SARS-CoV-2/FLU/RSV testing.  Fact Sheet for Patients: EntrepreneurPulse.com.au  Fact Sheet for Healthcare Providers: IncredibleEmployment.be  This test is not yet approved or cleared by the [place] FDA and has been authorized for detection and/or diagnosis of SARS-CoV-2 by FDA under an Emergency Use  Authorization (EUA). This EUA will remain in effect (meaning this test can be used) for the duration of the COVID-19 declaration under Section 564(b)(1) of the Act, 21 U.S.C. section 360bbb-3(b)(1), unless the authorization is terminated or revoked.  Performed at [facility], [place] [address] [place], [place] [address]        Radiology Studies: DG Pelvis 1-2 Views  Result Date: 05/21/2021 CLINICAL DATA:  Leg cramping. EXAM: PELVIS - 1-2 VIEW COMPARISON:  None. FINDINGS: There is no evidence of pelvic fracture or diastasis. No pelvic bone  lesions are seen. Degenerative changes seen involving the bilateral hips in the form of joint space narrowing and acetabular sclerosis. Additional degenerative changes are seen within the visualized portion of the lower lumbar spine. Radiopaque contrast is seen throughout the lumen of the urinary bladder. IMPRESSION: Degenerative changes without an acute osseous abnormality. Electronically Signed   By: Virgina Norfolk M.D.   On: 05/21/2021 04:12   CT Angio Chest Pulmonary Embolism (PE) W or WO Contrast  Result Date: 05/21/2021 CLINICAL DATA:  History of known right leg DVT with shortness of breath, initial encounter EXAM: CT ANGIOGRAPHY CHEST WITH CONTRAST TECHNIQUE: Multidetector CT imaging of the chest was performed using the standard protocol during bolus administration of intravenous contrast. Multiplanar CT image reconstructions and MIPs were obtained to evaluate the vascular anatomy. CONTRAST:  85mL OMNIPAQUE IOHEXOL 350 MG/ML SOLN COMPARISON:  05/17/2021, 05/02/2021 FINDINGS: Cardiovascular: Atherosclerotic calcifications of the thoracic aorta are noted. Ascending aorta is aneurysmally dilated at 4.3 cm. Normal tapering in the aortic arch is seen. Coronary calcifications are noted. Cardiac enlargement is noted. Pulmonary artery is well visualized within normal enhancement pattern. No filling defect to suggest pulmonary embolism is identified.  Mediastinum/Nodes: Thoracic inlet is within normal limits. No sizable hilar or mediastinal adenopathy is noted. The esophagus as visualized is within normal limits. Lungs/Pleura: Lungs are well aerated bilaterally. Patchy airspace opacities are noted in the lower lobes as well as in the lingula and upper lobes worst on the right. No sizable effusion is seen. No parenchymal nodules are noted. Upper Abdomen: Visualized upper abdomen demonstrates hepatic cysts similar to that seen on prior exams. Musculoskeletal: No acute rib abnormality is noted. Mild degenerative changes of the thoracic spine noted. Review of the MIP images confirms the above findings. IMPRESSION: No evidence of pulmonary emboli. Patchy airspace opacities noted bilaterally consistent with multifocal pneumonia. Dilatation of the ascending aorta to 4.3 cm. Recommend annual imaging followup by CTA or MRA. This recommendation follows 2010 ACCF/AHA/AATS/ACR/ASA/SCA/SCAI/SIR/STS/SVM Guidelines for the Diagnosis and Management of Patients with Thoracic Aortic Disease. Circulation. 2010; [id number]. Aortic aneurysm NOS (ICD10-I71.9) No other focal abnormality is noted. Aortic Atherosclerosis (ICD10-I70.0). Electronically Signed   By: Inez Catalina M.D.   On: 05/21/2021 02:56   MR BRAIN WO CONTRAST  Result Date: 05/21/2021 CLINICAL DATA:  Acute neurologic deficit EXAM: MRI HEAD WITHOUT CONTRAST TECHNIQUE: Multiplanar, multiecho pulse sequences of the brain and surrounding structures were obtained without intravenous contrast. COMPARISON:  None. FINDINGS: Brain: No acute infarct, mass effect or extra-axial collection. No acute or chronic hemorrhage. Confluent hyperintense T2-weighted white matter signal. Generalized volume loss without a clear lobar predilection. Old infarcts of the left thalamus and right basal ganglia. The midline structures are normal. Vascular: Major flow voids are preserved. Skull and upper cervical spine: Normal calvarium and  skull base. Visualized upper cervical spine and soft tissues are normal. Sinuses/Orbits:No paranasal sinus fluid levels or advanced mucosal thickening. No mastoid or middle ear effusion. Normal orbits. IMPRESSION: 1. No acute intracranial abnormality. 2. Old infarcts of the left thalamus and right basal ganglia. 3. Confluent hyperintense T2-weighted white matter signal, consistent with chronic small vessel ischemia. Electronically Signed   By: Ulyses Jarred M.D.   On: 05/21/2021 03:52   VAS [place] LOWER EXTREMITY VENOUS (DVT) (7a-7p)  Result Date: 05/20/2021  Lower Venous DVT Study Patient Name:  SYMON STROHMEYER  Date of Exam:   05/20/2021 Medical Rec #: [id number]     Accession #:    [id number] Date of Birth: 1930-01-10  Patient Gender: M Patient Age:   52 years Exam Location:  [facility] Procedure:      VAS [place] LOWER EXTREMITY VENOUS (DVT) Referring Phys: CHRISTIAN PROSPERI --------------------------------------------------------------------------------  Indications: Pain and cramping in right lower extremity.  Limitations: Patient position. Comparison Study: No prior study Performing Technologist: Gertie Fey MHA, RDMS, RVT, RDCS  Examination Guidelines: A complete evaluation includes B-mode imaging, spectral Doppler, color Doppler, and power Doppler as needed of all accessible portions of each vessel. Bilateral testing is considered an integral part of a complete examination. Limited examinations for reoccurring indications may be performed as noted. The reflux portion of the exam is performed with the patient in reverse Trendelenburg.  +---------+---------------+---------+-----------+----------+-----------------+  RIGHT     Compressibility Phasicity Spontaneity Properties Thrombus Aging     +---------+---------------+---------+-----------+----------+-----------------+  CFV       Full            Yes       Yes                                        +---------+---------------+---------+-----------+----------+-----------------+  SFJ       Full                                                                +---------+---------------+---------+-----------+----------+-----------------+  FV Prox   Partial                   No                     Age Indeterminate  +---------+---------------+---------+-----------+----------+-----------------+  FV Mid    Full                                                                +---------+---------------+---------+-----------+----------+-----------------+  FV Distal Full                                                                +---------+---------------+---------+-----------+----------+-----------------+  PFV       Full                                                                +---------+---------------+---------+-----------+----------+-----------------+  POP       Full                                                                +---------+---------------+---------+-----------+----------+-----------------+  PTV       None                      No                     Age Indeterminate  +---------+---------------+---------+-----------+----------+-----------------+   Right Technical Findings: Not visualized segments include peroneal veins.  Left Technical Findings: Not visualized segments include CFV.   Summary: RIGHT: - Findings consistent with age indeterminate deep vein thrombosis involving the right proximal femoral vein, and right posterior tibial veins. - No cystic structure found in the popliteal fossa.   *See table(s) above for measurements and observations.    Preliminary     Scheduled Meds:  cephALEXin  500 mg Oral Q8H   diltiazem  120 mg Oral q morning   dofetilide  125 mcg Oral BID   enoxaparin (LOVENOX) injection  65 mg Subcutaneous Q12H   ferrous sulfate  325 mg Oral Q breakfast   furosemide  20 mg Oral Daily   metoprolol tartrate  37.5 mg Oral BID   oseltamivir  30 mg Oral BID    rosuvastatin  20 mg Oral q morning   Continuous Infusions:   LOS: 0 days   Time spent: 40 minutes    Janisha Bueso Loann Quill, MD [facility]  If 7PM-7AM, please contact night-coverage [email or web address] 05/22/2021, 11:00 AM

## 2021-05-22 NOTE — TOC Transition Note (Signed)
Transition of Care [facility]) - CM/SW Discharge Note   Patient Details  Name: Edgar Thomas MRN: [id number] Date of Birth: 1930-03-11  [facility] CM/SW Contact:  Lawerance Sabal, RN Phone Number: 05/22/2021, 11:01 AM   Clinical Narrative:    Patient in observation, is active with [facility] OT, no new orders needed. Services will continue as scheduled prior to admission. No other TOC needs identified at this time    Final next level of care: Home w Home Health Services Barriers to Discharge: No Barriers Identified   Patient Goals and CMS Choice Patient states their goals for this hospitalization and ongoing recovery are:: to return home CMS Medicare.gov Compare Post Acute Care list provided to:: Other (Comment Required) Choice offered to / list presented to : Spouse  Discharge Placement                       Discharge Plan and Services                                     Social Determinants of Health (SDOH) Interventions     Readmission Risk Interventions No flowsheet data found.

## 2021-05-22 NOTE — Evaluation (Signed)
Physical Therapy Evaluation & Discharge Patient Details Name: Edgar Thomas MRN: [id number] DOB: [birth year] Today's Date: 05/22/2021  History of Present Illness  Pt is an 87 y.o. male recently d/c home 05/19/21 after admission for flu and UTI, now readmitted 05/20/21 with RLE pain. Workup for RLE age-indeterminate DVT. Pt also c/o RLE weakness; MRI negative for acute intracranial abnormality. Other PMH includes CAD (s/p CABG 2015), CHF, afib (on Eliquis), HTN, HLD, GERD, stroke.   Clinical Impression  Patient evaluated by Physical Therapy with no further acute PT needs identified. PTA, pt requires assist from wife for mobility and ADL tasks. Today, pt requiring up to [facility] for mobility, assist for majority of ADLs; wife present throughout session, reports comfortable to continue providing necessary assist upon return home. Education providing re: DME use, fall risk reduction. All education has been completed and the patient has no further questions. Acute PT is signing off. Thank you for this referral.     Recommendations for follow up therapy are one component of a multi-disciplinary discharge planning process, led by the attending physician.  Recommendations may be updated based on patient status, additional functional criteria and insurance authorization.  Follow Up Recommendations Home health PT    Assistance Recommended at Discharge Frequent or constant Supervision/Assistance  Functional Status Assessment Patient has had a recent decline in their functional status and demonstrates the ability to make significant improvements in function in a reasonable and predictable amount of time.  Equipment Recommendations  None recommended by PT    Recommendations for Other Services       Precautions / Restrictions Precautions Precautions: Fall;Other (comment) Precaution Comments: urine incontinence Restrictions Weight Bearing Restrictions: No      Mobility  Bed Mobility Overal bed mobility:  Needs Assistance Bed Mobility: Supine to Sit     Supine to sit: Max assist     General bed mobility comments: Received sitting in recliner    Transfers Overall transfer level: Needs assistance Equipment used: Rolling walker (2 wheels) Transfers: Sit to/from Stand Sit to Stand: Min assist;Min guard Stand pivot transfers: Min assist;+2 physical assistance;+2 safety/equipment         General transfer comment: Initial minA for trunk elevation standing from recliner to RW, additional 2x stand with close min guard; pt requires frequent cues to initiate and complete task    Ambulation/Gait Ambulation/Gait assistance: Min guard;Min assist;Mod assist Gait Distance (Feet): 40 Feet Assistive device: Rolling walker (2 wheels) Gait Pattern/deviations: Step-through pattern;Decreased stride length;Trunk flexed;Shuffle;Festinating Gait velocity: Decreased     General Gait Details: Slow, shuffling, festinating-like gait with RW and intermittent minA for stability and RW management; pt requiring intermittent cues to maintain grip on RW and not reach to hold onto wall/furniture; frequent, repeated verbal cues for sequencing and direction; pt forcefully attempting to sit on bed instead of in recliner, requiring modA to prevent premature sitting, max verbal cues for safety/direction  Stairs            Wheelchair Mobility    Modified Rankin (Stroke Patients Only)       Balance Overall balance assessment: Needs assistance Sitting-balance support: Feet supported;No upper extremity supported Sitting balance-Leahy Scale: Fair Sitting balance - Comments: Static sitting balance good, no LoB noted   Standing balance support: Single extremity supported;Bilateral upper extremity supported;During functional activity Standing balance-Leahy Scale: Poor Standing balance comment: reliant on external support  Pertinent Vitals/Pain Pain Assessment:  No/denies pain    Home Living Family/patient expects to be discharged to:: Private residence Living Arrangements: Spouse/significant other Available Help at Discharge: Family;Available 24 hours/day Type of Home: House Home Access: Stairs to enter Entrance Stairs-Rails: Right Entrance Stairs-Number of Steps: 6 Alternate Level Stairs-Number of Steps: 2 steps to bed room, 2 steps to living room Home Layout: Two level Home Equipment: Rolling Walker (2 wheels);BSC/3in1;Shower seat - built in;Grab bars - toilet;Grab bars - tub/shower;Hand held shower head      Prior Function Prior Level of Function : Needs assist  Cognitive Assist : Mobility (cognitive);ADLs (cognitive)     Physical Assist : Mobility (physical);ADLs (physical) Mobility (physical): Gait;Transfers ADLs (physical): Grooming;Bathing;Dressing;Toileting Mobility Comments: Wife assists with HHA for household ambulation; pt typically refuses use of walker. Wife holds pt's belt for support as needed to stand, etc. ADLs Comments: Wife assists with all ADLs.     Hand Dominance   Dominant Hand: Right    Extremity/Trunk Assessment   Upper Extremity Assessment Upper Extremity Assessment: Generalized weakness    Lower Extremity Assessment Lower Extremity Assessment: Generalized weakness    Cervical / Trunk Assessment Cervical / Trunk Assessment: Kyphotic  Communication   Communication: No difficulties  Cognition Arousal/Alertness: Awake/alert Behavior During Therapy: Flat affect Overall Cognitive Status: History of cognitive impairments - at baseline                                 General Comments: Wife endorses pt not following commands, "We joke, it's Edgar Thomas's way or no way" - pt inconsistently following simple commands, requires frequent repetition for task, frequent cues for sequencing; wife cuing pt appropriately, which pt seems to respond best to        General Comments General comments (skin  integrity, edema, etc.): Wife Edgar Thomas) present and supportive; wife observing session, at times providing assist, reports she feels comfortable assisting as needed; gait belt provided per request    Exercises     Assessment/Plan    PT Assessment All further PT needs can be met in the next venue of care  PT Problem List Decreased strength;Decreased mobility;Decreased activity tolerance;Decreased balance;Decreased knowledge of use of DME;Decreased cognition       PT Treatment Interventions      PT Goals (Current goals can be found in the Care Plan section)  Acute Rehab PT Goals PT Goal Formulation: All assessment and education complete, DC therapy    Frequency     Barriers to discharge        Co-evaluation               AM-PAC PT "6 Clicks" Mobility  Outcome Measure Help needed turning from your back to your side while in a flat bed without using bedrails?: A Little Help needed moving from lying on your back to sitting on the side of a flat bed without using bedrails?: A Lot Help needed moving to and from a bed to a chair (including a wheelchair)?: A Little Help needed standing up from a chair using your arms (e.g., wheelchair or bedside chair)?: A Lot Help needed to walk in hospital room?: A Lot Help needed climbing 3-5 steps with a railing? : A Lot 6 Click Score: 14    End of Session Equipment Utilized During Treatment: Gait belt Activity Tolerance: Patient tolerated treatment well Patient left: in chair;with call bell/phone within reach;with family/visitor present Nurse Communication: Mobility status  PT Visit Diagnosis: Muscle weakness (generalized) (M62.81);Difficulty in walking, not elsewhere classified (R26.2)    Time: 3643-8377 PT Time Calculation (min) (ACUTE ONLY): 18 min   Charges:   PT Evaluation $PT Eval Moderate Complexity: [place], PT, DPT Acute Rehabilitation Services  Pager [phone] Office Guadalupe Guerra 05/22/2021, 12:19 PM

## 2021-05-22 NOTE — Care Management Obs Status (Signed)
MEDICARE OBSERVATION STATUS NOTIFICATION   Patient Details  Name: Edgar Thomas MRN: [id number] Date of Birth: [birth year]   Medicare Observation Status Notification Given:  Yes    Lawerance Sabal, RN 05/22/2021, 11:00 AM

## 2021-05-22 NOTE — Plan of Care (Signed)

## 2021-05-22 NOTE — Progress Notes (Addendum)
ANTICOAGULATION CONSULT NOTE - Initial Consult  Pharmacy Consult for Lovenox Indication: DVT  Allergies  Allergen Reactions   Contrast Media [Iodinated Contrast Media] Rash    19 years ago at [facility]   Metrizamide Rash    19 years ago at [facility] (Amipaque)   Other Other (See Comments)    Patient preference: NO MEAT OR DAIRY!!   Amiodarone Other (See Comments)    Makes the patient walk sideways and he falls   Ciprofloxacin Other (See Comments)    Was told by MD to "not take"   Tramadol Nausea And Vomiting    Patient Measurements: Weight: 62 kg (136 lb 11 oz) Ht 70 in Wt 65.8 kg  Vital Signs: Temp: 98.2 F (36.8 C) (12/25 0624) Temp Source: Oral (12/25 0624) BP: 161/96 (12/25 0624) Pulse Rate: 72 (12/25 0624)  Labs: Recent Labs    05/20/21 1905 05/21/21 0550 05/22/21 0237  HGB 14.0 13.7 14.2  HCT 42.3 40.8 42.2  PLT 214 194 207  APTT 35  --   --   LABPROT 17.8*  --   --   INR 1.5*  --   --   CREATININE 0.91 0.95 0.95     Estimated Creatinine Clearance: 46.2 mL/min (by C-G formula based on SCr of 0.95 mg/dL).   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 M presents with R leg swelling. Found to have age-indeterminate DVT. Pt on Eliquis PTA for afib (last dose 12/23 1030). He and his daughter state that he is compliant - there was some time period when Eliquis was held for a planned lithotripsy but couldn't ascertain exactly when this was but prior to 12/6. Pt at [facility] from 12/6-12/13 and received Eliquis this entire time and has continued since then. Since pt developed clot on Eliquis - plan to use Lovenox for treatment.  Will continue to follow plans for transition to oral anticoagulation.  Goal of Therapy:  Anti-Xa level 0.6-1 units/ml 4hrs after LMWH dose given Monitor platelets by anticoagulation protocol: Yes   Plan:  Lovenox 65 mg SQ q12h CBC q72h while on Lovenox F/u plans for longterm oral AC   Thank you for allowing pharmacy to participate in this patient's care.  Enos Fling, PharmD PGY1 Pharmacy Resident 05/22/2021 7:22 AM Check [email or web address] for unit specific pharmacy number  ADDENDUM:  Pharmacy consulted to swap back to Eliquis. Patient is on 5 mg BID PTA. Per hematology/primary plans to resume Eliquis at 5 mg twice daily for age-indeterminate DVT. Last dose of enoxaparin was given this morning. Will resume Eliquis this evening with no load due to therapeutic anticoagulation previously.  Thank you for allowing pharmacy to participate in this patient's care.  Enos Fling, PharmD PGY1 Pharmacy Resident 05/22/2021 11:04 AM Check [email or web address] for unit specific pharmacy number

## 2021-05-22 NOTE — Evaluation (Signed)
Occupational Therapy Evaluation Patient Details Name: Edgar Thomas MRN: [id number] DOB: Mar 23, 1930 Today's Date: 05/22/2021   History of Present Illness Pt is an 87 y.o. male recently d/c home 05/19/21 after admission for flu and UTI, now readmitted 05/20/21 with RLE pain. Workup for RLE age-indeterminate DVT. Pt also c/o RLE weakness; MRI negative for acute intracranial abnormality. Other PMH includes CAD (s/p CABG 2015), CHF, afib (on Eliquis), HTN, HLD, GERD, stroke.   Clinical Impression   Pt admitted for concerns listed above. PTA Pt's wife reported that he was assisting her in all his ADL's, and ambulating with HHA through the home. At this time, pt continues to requiring 1-2 person HHA for functional mobility, he has difficulty initiating and following 1 step commands, as well as balance deficits and weakness. Wife reports that she will be able to provide all care for him at home. Recommending [facility] services. OT will follow acutely to address concerns listed below.      Recommendations for follow up therapy are one component of a multi-disciplinary discharge planning process, led by the attending physician.  Recommendations may be updated based on patient status, additional functional criteria and insurance authorization.   Follow Up Recommendations  Home health OT    Assistance Recommended at Discharge Frequent or constant Supervision/Assistance  Functional Status Assessment  Patient has had a recent decline in their functional status and demonstrates the ability to make significant improvements in function in a reasonable and predictable amount of time.  Equipment Recommendations  None recommended by OT    Recommendations for Other Services       Precautions / Restrictions Precautions Precautions: Fall;Other (comment) Precaution Comments: watch HR Restrictions Weight Bearing Restrictions: No      Mobility Bed Mobility Overal bed mobility: Needs Assistance Bed Mobility:  Supine to Sit     Supine to sit: Max assist     General bed mobility comments: PT not assisting with sitting until OT had legs off the bed and was assisting with elevating trunk    Transfers Overall transfer level: Needs assistance Equipment used: 2 person hand held assist Transfers: Sit to/from Stand;Bed to chair/wheelchair/BSC Sit to Stand: Mod assist Stand pivot transfers: Min assist;+2 physical assistance;+2 safety/equipment         General transfer comment: assist to power up and stabilize balance      Balance Overall balance assessment: Needs assistance Sitting-balance support: Feet supported;No upper extremity supported Sitting balance-Leahy Scale: Fair Sitting balance - Comments: Static sitting balance good, no LoB noted   Standing balance support: Bilateral upper extremity supported Standing balance-Leahy Scale: Poor Standing balance comment: reliant on external support                           ADL either performed or assessed with clinical judgement   ADL Overall ADL's : Needs assistance/impaired Eating/Feeding: Minimal assistance;Sitting   Grooming: Minimal assistance;Sitting   Upper Body Bathing: Minimal assistance;Sitting   Lower Body Bathing: Maximal assistance;Sitting/lateral leans;Sit to/from stand   Upper Body Dressing : Minimal assistance;Sitting   Lower Body Dressing: Maximal assistance;Sitting/lateral leans;Sit to/from stand   Toilet Transfer: Maximal assistance;Ambulation   Toileting- Clothing Manipulation and Hygiene: Maximal assistance;Sitting/lateral lean;Sit to/from stand       Functional mobility during ADLs: Maximal assistance General ADL Comments: Pt with difficulty following commands this session or initiating tasks, requirin gmultimodal cuing as well as increased assist due to weakness and poor balance.     Vision Baseline  Vision/History: 0 No visual deficits Ability to See in Adequate Light: 0 Adequate Patient  Visual Report: No change from baseline Vision Assessment?: No apparent visual deficits     Perception     Praxis      Pertinent Vitals/Pain Pain Assessment: No/denies pain     Hand Dominance Right   Extremity/Trunk Assessment Upper Extremity Assessment Upper Extremity Assessment: Generalized weakness   Lower Extremity Assessment Lower Extremity Assessment: Defer to PT evaluation   Cervical / Trunk Assessment Cervical / Trunk Assessment: Kyphotic   Communication Communication Communication: No difficulties   Cognition Arousal/Alertness: Lethargic Behavior During Therapy: Flat affect Overall Cognitive Status: History of cognitive impairments - at baseline                                 General Comments: SLow to respond, does not initiate without max multimodal cues.     General Comments  VSS on RA    Exercises     Shoulder Instructions      Home Living Family/patient expects to be discharged to:: Private residence Living Arrangements: Spouse/significant other Available Help at Discharge: Family;Available 24 hours/day Type of Home: House Home Access: Stairs to enter [employer] of Steps: 6 Entrance Stairs-Rails: Right Home Layout: Two level Alternate Level Stairs-Number of Steps: 2 steps to bed room, 2 steps to living room Alternate Level Stairs-Rails: Right;Left;Can reach both Bathroom Shower/Tub: Producer, television/film/video: Handicapped height Bathroom Accessibility: Yes How Accessible: Accessible via walker Home Equipment: Rolling Walker (2 wheels);BSC/3in1;Shower seat - built in;Grab bars - toilet;Grab bars - tub/shower;Hand held shower head          Prior Functioning/Environment Prior Level of Function : Needs assist       Physical Assist : Mobility (physical);ADLs (physical) Mobility (physical): Gait;Transfers ADLs (physical): Grooming;Bathing;Dressing;Toileting Mobility Comments: wife assists with ambulation  household distances. Pt refuses RW. Wife holds onto pt's belt to provide support. ADLs Comments: Wife assists with all ADLs.        OT Problem List: Decreased strength;Decreased range of motion;Decreased activity tolerance;Impaired balance (sitting and/or standing);Decreased coordination;Decreased cognition;Decreased safety awareness;Decreased knowledge of use of DME or AE;Impaired UE functional use      OT Treatment/Interventions: Self-care/ADL training;Therapeutic exercise;Energy conservation;DME and/or AE instruction;Therapeutic activities;Patient/family education;Balance training    OT Goals(Current goals can be found in the care plan section) Acute Rehab OT Goals Patient Stated Goal: Wife wants pt to get stronger OT Goal Formulation: With patient/family Time For Goal Achievement: 06/05/21 Potential to Achieve Goals: Fair ADL Goals Pt Will Perform Grooming: with set-up;sitting Pt Will Perform Lower Body Bathing: with min assist;sitting/lateral leans;sit to/from stand Pt Will Perform Lower Body Dressing: with min assist;sitting/lateral leans;sit to/from stand Pt Will Transfer to Toilet: with min assist;ambulating Pt Will Perform Toileting - Clothing Manipulation and hygiene: with min assist;sitting/lateral leans;sit to/from stand  OT Frequency: Min 2X/week   Barriers to D/C:            Co-evaluation              AM-PAC OT "6 Clicks" Daily Activity     Outcome Measure Help from another person eating meals?: A Little Help from another person taking care of personal grooming?: A Little Help from another person toileting, which includes using toliet, bedpan, or urinal?: A Lot Help from another person bathing (including washing, rinsing, drying)?: A Lot Help from another person to put on and taking off regular  upper body clothing?: A Little Help from another person to put on and taking off regular lower body clothing?: A Lot 6 Click Score: 15   End of Session Equipment  Utilized During Treatment: Gait belt Nurse Communication: Mobility status  Activity Tolerance: Patient limited by fatigue Patient left: in chair;with call bell/phone within reach;with family/visitor present  OT Visit Diagnosis: Unsteadiness on feet (R26.81);Other abnormalities of gait and mobility (R26.89);Muscle weakness (generalized) (M62.81)                Time: 3235-5732 OT Time Calculation (min): 16 min Charges:  OT General Charges $OT Visit: 1 Visit OT Evaluation $OT Eval Moderate Complexity: 1 Mod  Gresham Caetano H., OTR/L Acute Rehabilitation  Skarlett Sedlacek Elane Bing Plume 05/22/2021, 9:54 AM

## 2021-05-23 DIAGNOSIS — I824Y1 Acute embolism and thrombosis of unspecified deep veins of right proximal lower extremity: Secondary | ICD-10-CM | POA: Diagnosis not present

## 2021-05-23 DIAGNOSIS — I2510 Atherosclerotic heart disease of native coronary artery without angina pectoris: Secondary | ICD-10-CM | POA: Diagnosis not present

## 2021-05-23 DIAGNOSIS — I5042 Chronic combined systolic (congestive) and diastolic (congestive) heart failure: Secondary | ICD-10-CM | POA: Diagnosis not present

## 2021-05-23 DIAGNOSIS — I10 Essential (primary) hypertension: Secondary | ICD-10-CM | POA: Diagnosis not present

## 2021-05-23 LAB — BASIC METABOLIC PANEL
Anion gap: 6 (ref 5–15)
BUN: 16 mg/dL (ref 8–23)
CO2: 26 mmol/L (ref 22–32)
Calcium: 8.3 mg/dL — ABNORMAL LOW (ref 8.9–10.3)
Chloride: 105 mmol/L (ref 98–111)
Creatinine, Ser: 0.74 mg/dL (ref 0.61–1.24)
GFR, Estimated: >60 mL/min (ref >60)
Glucose, Bld: 104 mg/dL — ABNORMAL HIGH (ref 70–99)
Potassium: 3.8 mmol/L (ref 3.5–5.1)
Sodium: 137 mmol/L (ref 135–145)

## 2021-05-23 LAB — CULTURE, BLOOD (ROUTINE X 2)
Culture: NO GROWTH
Culture: NO GROWTH
Special Requests: ADEQUATE

## 2021-05-23 LAB — CBC
HCT: 41.1 % (ref 39.0–52.0)
Hemoglobin: 14.1 g/dL (ref 13.0–17.0)
MCH: 32.4 pg (ref 26.0–34.0)
MCHC: 34.3 g/dL (ref 30.0–36.0)
MCV: 94.5 fL (ref 80.0–100.0)
Platelets: 171 10*3/uL (ref 150–400)
RBC: 4.35 MIL/uL (ref 4.22–5.81)
RDW: 13.3 % (ref 11.5–15.5)
WBC: 9.8 10*3/uL (ref 4.0–10.5)
nRBC: 0 % (ref 0.0–0.2)

## 2021-05-23 LAB — MAGNESIUM: Magnesium: 2 mg/dL (ref 1.7–2.4)

## 2021-05-23 LAB — PROCALCITONIN: Procalcitonin: <0.1 ng/mL

## 2021-05-23 MED ORDER — POTASSIUM CHLORIDE CRYS ER 20 MEQ PO TBCR
40.0000 meq | EXTENDED_RELEASE_TABLET | Freq: Once | ORAL | Status: AC
  Administered 2021-05-23: 14:00:00 40 meq via ORAL
  Filled 2021-05-23: qty 2

## 2021-05-23 MED ORDER — BENZONATATE 100 MG PO CAPS: 100.0000 mg | ORAL_CAPSULE | Freq: Three times a day (TID) | ORAL | 0 refills | Status: DC

## 2021-05-23 MED ORDER — IPRATROPIUM-ALBUTEROL 0.5-2.5 (3) MG/3ML IN SOLN
3.0000 mL | Freq: Once | RESPIRATORY_TRACT | Status: AC
  Administered 2021-05-23: 11:00:00 3 mL via RESPIRATORY_TRACT
  Filled 2021-05-23: qty 3

## 2021-05-23 MED ORDER — POTASSIUM CHLORIDE CRYS ER 10 MEQ PO TBCR: 10.0000 meq | EXTENDED_RELEASE_TABLET | Freq: Every day | ORAL | 0 refills | Status: DC

## 2021-05-23 NOTE — Discharge Summary (Signed)
Physician Discharge Summary  Edgar Thomas [id number] DOB: 1929-11-10 DOA: 05/20/2021  PCP: [name], MD  Admit date: 05/20/2021 Discharge date: 05/23/2021  Admitted From: Home Disposition: Home  Recommendations for Outpatient Follow-up:  Follow-up with PCP in 1 to 2 weeks Repeat CBC and BMP on follow-up visit Follow-up with cardiology in 2 weeks Continue potassium supplements while he on Lasix and Tikosyn.  Potassium goal is above 4.Tessalon Perles for cough.  Home Health: Yes Equipment/Devices: None Discharge Condition: Stable CODE STATUS: DNR Diet recommendation: Heart healthy diet  Brief/Interim Summary:  Edgar Thomas is a 87 y.o. male with history of stroke with difficulty speaking, atrial fibrillation, chronic systolic CHF, CAD status post CABG, hyperlipidemia and hypertension was recently admitted to the hospital for acute metabolic encephalopathy and was found to be flu positive and also UTI discharged home yesterday has been experiencing increasing crampy right lower extremity pain and patient's family friend who is a physician advised to come to the ER to check out for DVT.  Patient has been having productive cough prior to the admission last time.  Denies any chest pain or shortness of breath.   ED Course: In the ER patient's Dopplers showed right lower extremity femoral vein and tibial vein DVT age-indeterminate.  This is despite patient being on Eliquis.  So patient was started on Lovenox.  On exam patient also has difficulty moving his right lower extremity which as per the patient's wife is new.  Labs are largely at baseline.  Patient admitted for further management of DVT despite being on Eliquis and also right leg weakness.  Age-indeterminate right lower extremity DVT: -On Eliquis at home.  Doppler ultrasound shows age-indeterminate DVT in right lower extremity.  Started on Lovenox. -X-ray of right hip shows degenerative changes.  No fracture. -CTA chest negative for  PE -discussed with on-call oncologist Dr. Earvin Hansen recommended to continue same anticoagulation which he was on prior to the hospitalization since the Doppler shows age-indeterminate DVT (not acute).  Close follow-up with PCP and cardiology if anything changes. -Pharmacy consulted for Eliquis-started back on Eliquis 5 mg twice daily  Right lower extremity weakness/pain: -MRI brain shows no acute intracranial abnormality.  Old infarct of the left thalamus and right basal ganglia.  Chronic small vessel ischemia. -Consult PT/OT- recommended home health PT/OT -He is already active with [facility] PT/OT -Fall precautions.  X-rays negative for fracture  Bilateral multifocal pneumonia due to flu A: -Patient finished Tamiflu twice daily for 5 days -CTA chest shows multifocal pneumonia.  He remained afebrile, leukocytosis resolved.  Procalcitonin: <0.10-no indication of antibiotics. -Tessalon Perles prescribed at the time of discharge.  Status post 1 DuoNeb treatment -Close follow-up with PCP outpatient   Chronic combined systolic and diastolic CHF with ejection fraction of 40 to 45% in 2015: Patient appears to be euvolemic on exam.  Continue Lasix, lisinopril.  Continue strict INO's and daily weight.  Monitor kidney function closely.  Monitor electrolytes. -We will discharge patient on potassium 10 M EQ once daily as he is on Lasix at home   Atrial fibrillation: Rate controlled. -Continue metoprolol, Tikosyn and Cardizem.  Continued Eliquis -Replaced potassium and magnesium to keep potassium above 4 and magnesium above 2   Leukocytosis: Resolved -WBC trended up to 13.1.  Patient is afebrile   Coronary artery disease status post CABG: Patient denies any ACS symptoms   Aortic atherosclerosis: Noted on CT chest   Dilatation of ascending aorta: 4.3 cm in size.  Recommended annual imaging follow-up by  CTA or MRA. -Follow-up with PCP outpatient.   UTI: -Finished Keflex course.  Urine  culture shows multiple species.  Discharge Diagnoses:  Age-indeterminate right lower extremity DVT Right lower extremity pain/weakness Bilateral multifocal pneumonia due to fluid Chronic combined systolic and diastolic CHF with ejection fraction of 40 to 45% Rate controlled A. Fib Leukocytosis Coronary artery disease status post CABG Aortic atherosclerosis Dilatation of ascending aorta UTI   Discharge Instructions  Discharge Instructions     Diet - low sodium heart healthy   Complete by: As directed    Discharge instructions   Complete by: As directed    Follow-up with PCP in 1 to 2 weeks Repeat CBC and BMP on follow-up visit Follow-up with cardiology in 2 weeks Continue potassium supplements while he on Lasix and Tikosyn.  Potassium goal is above 4. Tessalon Perles for cough.   Increase activity slowly   Complete by: As directed    No wound care   Complete by: As directed       Allergies as of 05/23/2021       Reactions   Contrast Media [iodinated Contrast Media] Rash   [facility] years ago at [facility]   Metrizamide Rash   19 years ago at [facility] (Amipaque)   Other Other (See Comments)   Patient preference: NO MEAT OR DAIRY!!   Amiodarone Other (See Comments)   Makes the patient walk sideways and he falls   Ciprofloxacin Other (See Comments)   Was told by MD to "not take"   Tramadol Nausea And Vomiting        Medication List     STOP taking these medications    cephALEXin 500 MG capsule Commonly known as: KEFLEX   oseltamivir 30 MG capsule Commonly known as: TAMIFLU       TAKE these medications    benzonatate 100 MG capsule Commonly known as: Tessalon Perles Take 1 capsule (100 mg total) by mouth 3 (three) times daily.   Crestor 20 MG tablet Generic drug: rosuvastatin Take 20 mg by mouth every morning.   diltiazem 120 MG 24 hr capsule Commonly known as: CARDIZEM CD Take 120 mg by mouth every morning.    dofetilide 125 MCG capsule Commonly known as: TIKOSYN Take 125 mcg by mouth 2 (two) times daily.   Eliquis 5 MG Tabs tablet Generic drug: apixaban Take 5 mg by mouth 2 (two) times daily.   ferrous sulfate 325 (65 FE) MG tablet Take 325 mg by mouth daily with breakfast.   furosemide 40 MG tablet Commonly known as: LASIX Take 20 mg by mouth daily.   guaiFENesin 600 MG 12 hr tablet Commonly known as: MUCINEX Take 2 tablets (1,200 mg total) by mouth 2 (two) times daily.   Jardiance 10 MG Tabs tablet Generic drug: empagliflozin Take 10 mg by mouth every morning.   metoprolol tartrate 25 MG tablet Commonly known as: LOPRESSOR Take 1 & 1/2 tablets (37.5 mg total) by mouth 2 (two) times daily.   multivitamin-lutein Caps capsule Take 1 capsule by mouth daily with breakfast.   One-A-Day Mens 50+ Advantage Tabs Take 1 tablet by mouth daily with breakfast.   nitroGLYCERIN 0.4 MG SL tablet Commonly known as: NITROSTAT Place 0.4 mg under the tongue every 5 (five) minutes as needed for chest pain.   polyethylene glycol 17 g packet Commonly known as: MIRALAX / GLYCOLAX Take 17 g by mouth daily as needed for moderate constipation.   potassium chloride 10 MEQ tablet Commonly  known as: KLOR-CON M Take 1 tablet (10 mEq total) by mouth daily.   Vitamin D-3 25 MCG (1000 UT) Caps Take 1,000 Units by mouth daily after breakfast.        Follow-up Information     [facility] Follow up.   Specialty: Home Health Services Why: For home health services as set up prior to admission Contact information: [address] [place] [address] [phone]                Allergies  Allergen Reactions   Contrast Media [Iodinated Contrast Media] Rash    19 years ago at [facility]   Metrizamide Rash    19 years ago at [facility] (Amipaque)   Other Other (See Comments)    Patient preference: NO MEAT OR DAIRY!!   Amiodarone Other  (See Comments)    Makes the patient walk sideways and he falls   Ciprofloxacin Other (See Comments)    Was told by MD to "not take"   Tramadol Nausea And Vomiting    Consultations: None   Procedures/Studies: DG Chest 2 View  Result Date: 05/17/2021 CLINICAL DATA:  Weakness, fatigue EXAM: CHEST - 2 VIEW COMPARISON:  Chest radiograph 11/08/2018 FINDINGS: Postsurgical changes reflecting prior CABG are again noted. The heart is enlarged, unchanged. The mediastinal contours are stable. There is no focal consolidation or pulmonary edema. There is no pleural effusion or pneumothorax. There is no acute osseous abnormality. IMPRESSION: Unchanged cardiomegaly. No radiographic evidence of acute cardiopulmonary process. Electronically Signed   By: Lesia Hausen M.D.   On: 05/17/2021 16:25   DG Pelvis 1-2 Views  Result Date: 05/21/2021 CLINICAL DATA:  Leg cramping. EXAM: PELVIS - 1-2 VIEW COMPARISON:  None. FINDINGS: There is no evidence of pelvic fracture or diastasis. No pelvic bone lesions are seen. Degenerative changes seen involving the bilateral hips in the form of joint space narrowing and acetabular sclerosis. Additional degenerative changes are seen within the visualized portion of the lower lumbar spine. Radiopaque contrast is seen throughout the lumen of the urinary bladder. IMPRESSION: Degenerative changes without an acute osseous abnormality. Electronically Signed   By: Aram Candela M.D.   On: 05/21/2021 04:12   CT Head Wo Contrast  Result Date: 05/02/2021 CLINICAL DATA:  Fall EXAM: CT HEAD WITHOUT CONTRAST TECHNIQUE: Contiguous axial images were obtained from the base of the skull through the vertex without intravenous contrast. COMPARISON:  Brain MRI 10/28/2020, CT head 04/24/2021 FINDINGS: Brain: There is no evidence of acute intracranial hemorrhage, extra-axial fluid collection, or acute infarct. There is moderate global parenchymal volume loss with enlargement of the ventricular  system and extra-axial CSF spaces, unchanged. There is extensive confluent hypodensity throughout the subcortical and periventricular white matter likely reflecting advanced chronic white matter microangiopathy. There are remote infarcts in the right caudate head, right internal capsule, and left basal ganglia and thalamus. There is no solid mass lesion.  There is no midline shift. Vascular: There is dense calcification in the vertebrobasilar system and cavernous ICAs. Skull: Normal. Negative for fracture or focal lesion. Sinuses/Orbits: There is mild mucosal thickening in the paranasal sinuses. Bilateral lens implants are in place. The globes and orbits are otherwise unremarkable. Other: None. IMPRESSION: 1. No acute intracranial hemorrhage or calvarial fracture. 2. Unchanged global parenchymal volume loss, advanced chronic white matter microangiopathy, and remote lacunar infarcts as above. Electronically Signed   By: Lesia Hausen M.D.   On: 05/02/2021 15:10   CT Head Wo Contrast  Result Date: 04/24/2021 CLINICAL DATA:  Delirium.  History of "mini stroke" in May. EXAM: CT HEAD WITHOUT CONTRAST TECHNIQUE: Contiguous axial images were obtained from the base of the skull through the vertex without intravenous contrast. COMPARISON:  CT head 11/09/2018 FINDINGS: Brain: No evidence of acute infarction, hemorrhage, hydrocephalus, extra-axial collection or mass lesion/mass effect. Chronic infarcts in the right internal capsule and left thalamus. Hypodensities in the periventricular and subcortical white matter, consistent with chronic small-vessel ischemic changes. Generalized parenchymal volume loss with associated enlargement of the ventricles. Vascular: No hyperdense vessel. Vascular calcifications at the skull base, unchanged compared to 11/09/2018 Skull: Normal. Negative for fracture or focal lesion. Sinuses/Orbits: No acute finding. Mild mucosal thickening in the left frontal sinus and a few scattered bilateral  ethmoid air cells. Trace mucosal thickening also noted in the right frontal sinus. Other visualized paranasal sinuses and mastoid air cells are clear. Other: None. IMPRESSION: No acute intracranial abnormality. Stable exam with generalized parenchymal volume loss and chronic infarcts in the right internal capsule and left thalamus. Electronically Signed   By: Ileana Roup M.D.   On: 04/24/2021 09:52   CT Angio Chest Pulmonary Embolism (PE) W or WO Contrast  Result Date: 05/21/2021 CLINICAL DATA:  History of known right leg DVT with shortness of breath, initial encounter EXAM: CT ANGIOGRAPHY CHEST WITH CONTRAST TECHNIQUE: Multidetector CT imaging of the chest was performed using the standard protocol during bolus administration of intravenous contrast. Multiplanar CT image reconstructions and MIPs were obtained to evaluate the vascular anatomy. CONTRAST:  22mL OMNIPAQUE IOHEXOL 350 MG/ML SOLN COMPARISON:  05/17/2021, 05/02/2021 FINDINGS: Cardiovascular: Atherosclerotic calcifications of the thoracic aorta are noted. Ascending aorta is aneurysmally dilated at 4.3 cm. Normal tapering in the aortic arch is seen. Coronary calcifications are noted. Cardiac enlargement is noted. Pulmonary artery is well visualized within normal enhancement pattern. No filling defect to suggest pulmonary embolism is identified. Mediastinum/Nodes: Thoracic inlet is within normal limits. No sizable hilar or mediastinal adenopathy is noted. The esophagus as visualized is within normal limits. Lungs/Pleura: Lungs are well aerated bilaterally. Patchy airspace opacities are noted in the lower lobes as well as in the lingula and upper lobes worst on the right. No sizable effusion is seen. No parenchymal nodules are noted. Upper Abdomen: Visualized upper abdomen demonstrates hepatic cysts similar to that seen on prior exams. Musculoskeletal: No acute rib abnormality is noted. Mild degenerative changes of the thoracic spine noted. Review of the  MIP images confirms the above findings. IMPRESSION: No evidence of pulmonary emboli. Patchy airspace opacities noted bilaterally consistent with multifocal pneumonia. Dilatation of the ascending aorta to 4.3 cm. Recommend annual imaging followup by CTA or MRA. This recommendation follows 2010 ACCF/AHA/AATS/ACR/ASA/SCA/SCAI/SIR/STS/SVM Guidelines for the Diagnosis and Management of Patients with Thoracic Aortic Disease. Circulation. 2010; [id number]. Aortic aneurysm NOS (ICD10-I71.9) No other focal abnormality is noted. Aortic Atherosclerosis (ICD10-I70.0). Electronically Signed   By: Inez Catalina M.D.   On: 05/21/2021 02:56   MR BRAIN WO CONTRAST  Result Date: 05/21/2021 CLINICAL DATA:  Acute neurologic deficit EXAM: MRI HEAD WITHOUT CONTRAST TECHNIQUE: Multiplanar, multiecho pulse sequences of the brain and surrounding structures were obtained without intravenous contrast. COMPARISON:  None. FINDINGS: Brain: No acute infarct, mass effect or extra-axial collection. No acute or chronic hemorrhage. Confluent hyperintense T2-weighted white matter signal. Generalized volume loss without a clear lobar predilection. Old infarcts of the left thalamus and right basal ganglia. The midline structures are normal. Vascular: Major flow voids are preserved. Skull and upper cervical  spine: Normal calvarium and skull base. Visualized upper cervical spine and soft tissues are normal. Sinuses/Orbits:No paranasal sinus fluid levels or advanced mucosal thickening. No mastoid or middle ear effusion. Normal orbits. IMPRESSION: 1. No acute intracranial abnormality. 2. Old infarcts of the left thalamus and right basal ganglia. 3. Confluent hyperintense T2-weighted white matter signal, consistent with chronic small vessel ischemia. Electronically Signed   By: Ulyses Jarred M.D.   On: 05/21/2021 03:52   US RENAL  Result Date: 05/17/2021 CLINICAL DATA:  Urinary bladder stone. EXAM: RENAL / URINARY TRACT ULTRASOUND COMPLETE  COMPARISON:  CT renal 05/02/2021 FINDINGS: Right Kidney: Renal measurements: 9.6 x 4 x 3.4 cm = volume: 70 mL. Echogenicity within normal limits. No mass or hydronephrosis visualized. Left Kidney: Renal measurements: 8.9 x 5.3 x 4 1 cm = volume: 103 mL. Echogenicity within normal limits. No mass or hydronephrosis visualized. Urinary bladder: There is a 1.9 cm calcified stone within the urinary bladder lumen. Otherwise appears normal for degree of bladder distention. Other: Hepatic cystic lesions incidentally noted as seen on CT renal 05/02/2021. IMPRESSION: 1.  A 1.9 cm calcified stone within the urinary bladder lumen. 2. Otherwise unremarkable renal ultrasound. Electronically Signed   By: Iven Finn M.D.   On: 05/17/2021 22:06   CT L-SPINE NO CHARGE  Result Date: 05/02/2021 CLINICAL DATA:  Fall EXAM: CT LUMBAR SPINE WITHOUT CONTRAST TECHNIQUE: Multidetector CT imaging of the lumbar spine was performed without intravenous contrast administration. Multiplanar CT image reconstructions were also generated. COMPARISON:  CT abdomen/pelvis 04/24/2021, CT pelvis 11/08/2018 FINDINGS: Segmentation: Standard; the lowest formed disc space is designated L5-S1 Alignment: There is grade 1 anterolisthesis of L4 on L5, unchanged since the prior CT. Alignment is otherwise normal. Vertebrae: Lumbar vertebral body heights are preserved. There is no evidence of acute fracture. There is no suspicious osseous lesion. Paraspinal and other soft tissues: The paraspinal soft tissues are unremarkable. The abdominal and pelvic viscera are described on the separately dictated CT abdomen/pelvis. Disc levels: There is marked disc space narrowing with vacuum disc phenomenon and associated degenerative endplate change at 075-GRM. There is more mild disc space narrowing with vacuum disc phenomenon at L4-L5. The other disc spaces are preserved. There is mild facet arthropathy throughout the lumbar spine, most advanced at L4-L5. The osseous  spinal canal is patent. There is mild bilateral neural foraminal stenosis at L4-L5 and L5-S1. IMPRESSION: 1. No acute fracture or traumatic malalignment of the lumbar spine. 2. Grade 1 anterolisthesis of L4 on L5, likely degenerative in nature. 3. Disc space narrowing with associated degenerative endplate change and bilateral facet arthropathy at L4-L5 and L5-S1, more advanced at L5-S1. Mild bilateral neural foraminal stenosis at both levels. Electronically Signed   By: Valetta Mole M.D.   On: 05/02/2021 15:06   CT Renal Stone Study  Result Date: 05/02/2021 CLINICAL DATA:  Fall, hematuria, history of kidney stones EXAM: CT ABDOMEN AND PELVIS WITHOUT CONTRAST TECHNIQUE: Multidetector CT imaging of the abdomen and pelvis was performed following the standard protocol without IV contrast. COMPARISON:  CT abdomen/pelvis 04/24/2021 FINDINGS: Lower chest: Reticular opacities in the lung bases likely reflects atelectasis. The heart is enlarged. There are dense coronary artery calcifications. Hepatobiliary: Multiple liver cysts are again seen, unchanged. Scattered parenchymal calcifications are also unchanged likely reflecting small calcified granulomas. The gallbladder is unremarkable. There is no biliary ductal dilatation. Pancreas: The pancreas is atrophic. There are no focal lesions or contour abnormalities. There is no main pancreatic ductal dilatation or peripancreatic inflammatory change.  Spleen: Unremarkable. Adrenals/Urinary Tract: The adrenals are unremarkable. A 4 mm nonobstructing left renal stone versus vascular calcification in the interpolar region is unchanged. There are no other stones in either kidney. There are no focal lesions, within the confines of noncontrast technique. There is no hydronephrosis or hydroureter. A large bladder stone measuring 1.8 cm is again seen in the bladder. The stone is now layering dependently at the midline. The decompressed bladder is otherwise unremarkable. Stomach/Bowel:  The stomach is unremarkable. There is no evidence of bowel Vascular/Lymphatic: There is dense calcified atherosclerotic plaque throughout the nonaneurysmal abdominal aorta. There is no abdominal or pelvic lymphadenopathy. Reproductive: The prostate is mildly enlarged. The seminal vesicles are unremarkable. Other: There is no ascites or free air. Musculoskeletal: There is age-indeterminate mild compression deformity of the T10 vertebral body, not imaged on the prior study from 04/24/2021, but favored chronic. Otherwise, there is no acute osseous abnormality or aggressive osseous lesion. IMPRESSION: 1. Large calculus in the bladder measuring up to 1.8 cm layering dependently at the midline. 2. Unchanged 4 mm renal stone versus vascular calcification in the left interpolar region. No hydronephrosis or hydroureter or obstructing stone. 3. Mild age-indeterminate compression deformity of the T10 vertebral body, favored chronic. Correlate with any point tenderness, and MRI may be considered as indicated to evaluate for acuity 4. Cardiomegaly with dense coronary artery calcifications. 5. Moderate stool burden throughout the colon. Aortic Atherosclerosis (ICD10-I70.0). Electronically Signed   By: Valetta Mole M.D.   On: 05/02/2021 15:01   CT Renal Stone Study  Result Date: 04/24/2021 CLINICAL DATA:  Hematuria for 2 days of unknown cause EXAM: CT ABDOMEN AND PELVIS WITHOUT CONTRAST TECHNIQUE: Multidetector CT imaging of the abdomen and pelvis was performed following the standard protocol without IV contrast. COMPARISON:  CT pelvis 11/08/2018 FINDINGS: Lower chest: Accentuation of bibasilar interstitial markings and minimal bibasilar atelectasis Hepatobiliary: Calcified granulomata and cysts within liver. Gallbladder unremarkable. No additional hepatic masses. Pancreas: Atrophic pancreas without mass Spleen: Normal appearance.  Small adjacent splenule Adrenals/Urinary Tract: Adrenal glands normal appearance. Small  calcifications at LEFT kidney which could be renovascular or tiny calculi. No renal mass, hydronephrosis or hydroureter. No ureteral calculus. Large bladder calculus identified on RIGHT at/adjacent to ureterovesical junction, 13 x 11 mm. Stomach/Bowel: Increased stool in rectum. Prominent stool in proximal half of colon. Stomach decompressed. Remaining bowel loops unremarkable. Vascular/Lymphatic: Pelvic phleboliths. Extensive atherosclerotic calcifications aorta, iliac arteries, visceral arteries, coronary arteries. Enlargement of cardiac chambers. No adenopathy. Reproductive: Mild prostatic enlargement, gland measuring 5.3 x 3.5 x 3.9 cm (volume = 38 cm^3) Other: No free air or free fluid. Question small BILATERAL inguinal hernias containing fat. Musculoskeletal: Bones demineralized IMPRESSION: Large bladder calculus at/adjacent to RIGHT ureterovesical junction measuring 13 x 11 mm without hydronephrosis or hydroureter. Small calcifications at LEFT kidney which could be renovascular or tiny nonobstructing calculi. Mild prostatic enlargement. Increased stool in rectum and proximal half of colon. Question small BILATERAL inguinal hernias containing fat. Aortic Atherosclerosis (ICD10-I70.0). Electronically Signed   By: Lavonia Dana M.D.   On: 04/24/2021 12:09   VAS [place] LOWER EXTREMITY VENOUS (DVT) (7a-7p)  Result Date: 05/22/2021  Lower Venous DVT Study Patient Name:  Edgar Thomas  Date of Exam:   05/20/2021 Medical Rec #: [id number]     Accession #:    [id number] Date of Birth: 1930/05/12      Patient Gender: M Patient Age:   87 years Exam Location:  [facility] Procedure:      VAS [place]  LOWER EXTREMITY VENOUS (DVT) Referring Phys: CHRISTIAN PROSPERI --------------------------------------------------------------------------------  Indications: Pain and cramping in right lower extremity.  Limitations: Patient position. Comparison Study: No prior study Performing Technologist: Gertie FeyMichelle Simonetti MHA, RDMS, RVT,  RDCS  Examination Guidelines: A complete evaluation includes B-mode imaging, spectral Doppler, color Doppler, and power Doppler as needed of all accessible portions of each vessel. Bilateral testing is considered an integral part of a complete examination. Limited examinations for reoccurring indications may be performed as noted. The reflux portion of the exam is performed with the patient in reverse Trendelenburg.  +---------+---------------+---------+-----------+----------+-----------------+  RIGHT     Compressibility Phasicity Spontaneity Properties Thrombus Aging     +---------+---------------+---------+-----------+----------+-----------------+  CFV       Full            Yes       Yes                                       +---------+---------------+---------+-----------+----------+-----------------+  SFJ       Full                                                                +---------+---------------+---------+-----------+----------+-----------------+  FV Prox   Partial                   No                     Age Indeterminate  +---------+---------------+---------+-----------+----------+-----------------+  FV Mid    Full                                                                +---------+---------------+---------+-----------+----------+-----------------+  FV Distal Full                                                                +---------+---------------+---------+-----------+----------+-----------------+  PFV       Full                                                                +---------+---------------+---------+-----------+----------+-----------------+  POP       Full                                                                +---------+---------------+---------+-----------+----------+-----------------+  PTV       None  No                     Age Indeterminate  +---------+---------------+---------+-----------+----------+-----------------+   Right Technical Findings:  Not visualized segments include peroneal veins.  Left Technical Findings: Not visualized segments include CFV.   Summary: RIGHT: - Findings consistent with age indeterminate deep vein thrombosis involving the right proximal femoral vein, and right posterior tibial veins. - No cystic structure found in the popliteal fossa.   *See table(s) above for measurements and observations. Electronically signed by Jamelle Haring on 05/22/2021 at 3:24:08 PM.    Final       Subjective: Patient seen and examined.  Wife at the bedside.  Patient resting comfortably on the back.  Has some cough but denies sputum production.  No fever, no acute events overnight.  Denies shortness of breath or wheezing. Patient is comfortable going home today.  Discharge Exam: Vitals:   05/23/21 1003 05/23/21 1039  BP: (!) 127/99   Pulse: 96   Resp: (!) 22   Temp:    SpO2:  96%   Vitals:   05/23/21 0604 05/23/21 0802 05/23/21 1003 05/23/21 1039  BP: (!) 160/92 (!) 159/74 (!) 127/99   Pulse: (!) 55 73 96   Resp: 20 20 (!) 22   Temp: 97.9 F (36.6 C) 97.9 F (36.6 C)    TempSrc: Oral Oral    SpO2: 95% 95%  96%  Weight:        General: Pt is alert, awake, not in acute distress Cardiovascular: RRR, S1/S2 +, no rubs, no gallops Respiratory: Has some coarse breath sounds.  No wheezing  abdominal: Soft, NT, ND, bowel sounds + Extremities: no edema, no cyanosis    The results of significant diagnostics from this hospitalization (including imaging, microbiology, ancillary and laboratory) are listed below for reference.     Microbiology: Recent Results (from the past 240 hour(s))  Urine Culture     Status: Abnormal   Collection Time: 05/17/21  6:36 PM   Specimen: Urine, Clean Catch  Result Value Ref Range Status   Specimen Description URINE, CLEAN CATCH  Final   Special Requests   Final    NONE Performed at [facility], [address], [place], [place] [address]    Culture MULTIPLE SPECIES PRESENT, SUGGEST  RECOLLECTION (A)  Final   Report Status 05/19/2021 FINAL  Final  Resp Panel by RT-PCR (Flu A&B, Covid) Nasopharyngeal Swab     Status: Abnormal   Collection Time: 05/17/21  8:08 PM   Specimen: Nasopharyngeal Swab; Nasopharyngeal(NP) swabs in vial transport medium  Result Value Ref Range Status   SARS Coronavirus 2 by RT PCR NEGATIVE NEGATIVE Final    Comment: (NOTE) SARS-CoV-2 target nucleic acids are NOT DETECTED.  The SARS-CoV-2 RNA is generally detectable in upper respiratory specimens during the acute phase of infection. The lowest concentration of SARS-CoV-2 viral copies this assay can detect is 138 copies/mL. A negative result does not preclude SARS-Cov-2 infection and should not be used as the sole basis for treatment or other patient management decisions. A negative result may occur with  improper specimen collection/handling, submission of specimen other than nasopharyngeal swab, presence of viral mutation(s) within the areas targeted by this assay, and inadequate number of viral copies(<138 copies/mL). A negative result must be combined with clinical observations, patient history, and epidemiological information. The expected result is Negative.  Fact Sheet for Patients:  EntrepreneurPulse.com.au  Fact Sheet for Healthcare Providers:  IncredibleEmployment.be  This  test is no t yet approved or cleared by the [place] and  has been authorized for detection and/or diagnosis of SARS-CoV-2 by FDA under an Emergency Use Authorization (EUA). This EUA will remain  in effect (meaning this test can be used) for the duration of the COVID-19 declaration under Section 564(b)(1) of the Act, 21 U.S.C.section 360bbb-3(b)(1), unless the authorization is terminated  or revoked sooner.       Influenza A by PCR POSITIVE (A) NEGATIVE Final   Influenza B by PCR NEGATIVE NEGATIVE Final    Comment: (NOTE) The Xpert Xpress SARS-CoV-2/FLU/RSV plus  assay is intended as an aid in the diagnosis of influenza from Nasopharyngeal swab specimens and should not be used as a sole basis for treatment. Nasal washings and aspirates are unacceptable for Xpert Xpress SARS-CoV-2/FLU/RSV testing.  Fact Sheet for Patients: BloggerCourse.com  Fact Sheet for Healthcare Providers: SeriousBroker.it  This test is not yet approved or cleared by the [place] FDA and has been authorized for detection and/or diagnosis of SARS-CoV-2 by FDA under an Emergency Use Authorization (EUA). This EUA will remain in effect (meaning this test can be used) for the duration of the COVID-19 declaration under Section 564(b)(1) of the Act, 21 U.S.C. section 360bbb-3(b)(1), unless the authorization is terminated or revoked.  Performed at [facility], [address], [place], [place] [address]   Culture, blood (routine x 2)     Status: None   Collection Time: 05/18/21  2:26 AM   Specimen: BLOOD  Result Value Ref Range Status   Specimen Description BLOOD LEFT ANTECUBITAL  Final   Special Requests   Final    BOTTLES DRAWN AEROBIC AND ANAEROBIC Blood Culture adequate volume   Culture   Final    NO GROWTH 5 DAYS Performed at [facility], [address], [place], [place] [address]    Report Status 05/23/2021 FINAL  Final  Culture, blood (routine x 2)     Status: None   Collection Time: 05/18/21  2:31 AM   Specimen: BLOOD RIGHT HAND  Result Value Ref Range Status   Specimen Description BLOOD RIGHT HAND  Final   Special Requests   Final    BOTTLES DRAWN AEROBIC AND ANAEROBIC Blood Culture results may not be optimal due to an inadequate volume of blood received in culture bottles   Culture   Final    NO GROWTH 5 DAYS Performed at [facility], [address], [place], [place] [address]    Report Status 05/23/2021 FINAL  Final  Resp Panel by RT-PCR (Flu A&B, Covid) Nasopharyngeal Swab      Status: Abnormal   Collection Time: 05/21/21 12:52 AM   Specimen: Nasopharyngeal Swab; Nasopharyngeal(NP) swabs in vial transport medium  Result Value Ref Range Status   SARS Coronavirus 2 by RT PCR NEGATIVE NEGATIVE Final    Comment: (NOTE) SARS-CoV-2 target nucleic acids are NOT DETECTED.  The SARS-CoV-2 RNA is generally detectable in upper respiratory specimens during the acute phase of infection. The lowest concentration of SARS-CoV-2 viral copies this assay can detect is 138 copies/mL. A negative result does not preclude SARS-Cov-2 infection and should not be used as the sole basis for treatment or other patient management decisions. A negative result may occur with  improper specimen collection/handling, submission of specimen other than nasopharyngeal swab, presence of viral mutation(s) within the areas targeted by this assay, and inadequate number of viral copies(<138 copies/mL). A negative result must be combined with  clinical observations, patient history, and epidemiological information. The expected result is Negative.  Fact Sheet for Patients:  EntrepreneurPulse.com.au  Fact Sheet for Healthcare Providers:  IncredibleEmployment.be  This test is no t yet approved or cleared by the [place] FDA and  has been authorized for detection and/or diagnosis of SARS-CoV-2 by FDA under an Emergency Use Authorization (EUA). This EUA will remain  in effect (meaning this test can be used) for the duration of the COVID-19 declaration under Section 564(b)(1) of the Act, 21 U.S.C.section 360bbb-3(b)(1), unless the authorization is terminated  or revoked sooner.       Influenza A by PCR POSITIVE (A) NEGATIVE Final   Influenza B by PCR NEGATIVE NEGATIVE Final    Comment: (NOTE) The Xpert Xpress SARS-CoV-2/FLU/RSV plus assay is intended as an aid in the diagnosis of influenza from Nasopharyngeal swab specimens and should not be used as a sole  basis for treatment. Nasal washings and aspirates are unacceptable for Xpert Xpress SARS-CoV-2/FLU/RSV testing.  Fact Sheet for Patients: EntrepreneurPulse.com.au  Fact Sheet for Healthcare Providers: IncredibleEmployment.be  This test is not yet approved or cleared by the [place] FDA and has been authorized for detection and/or diagnosis of SARS-CoV-2 by FDA under an Emergency Use Authorization (EUA). This EUA will remain in effect (meaning this test can be used) for the duration of the COVID-19 declaration under Section 564(b)(1) of the Act, 21 U.S.C. section 360bbb-3(b)(1), unless the authorization is terminated or revoked.  Performed at [facility], [place] [address], [place], [place] [address]      Labs: BNP (last 3 results) Recent Labs    05/17/21 1905  BNP [id number]*   Basic Metabolic Panel: Recent Labs  Lab 05/18/21 0214 05/19/21 0220 05/20/21 1905 05/21/21 0550 05/22/21 0237 05/23/21 0727  NA 135 134* 135 136 139 137  K 3.6 3.7 4.4 4.1 3.7 3.8  CL 102 103 103 102 107 105  CO2 24 24 25 25 24 26   GLUCOSE 94 88 105* 126* 115* 104*  BUN 23 18 16 16  24* 16  CREATININE 0.96 0.84 0.91 0.95 0.95 0.74  CALCIUM 8.7* 8.2* 8.8* 8.5* 8.5* 8.3*  MG 2.2   2.2 1.9  --   --   --  2.0   Liver Function Tests: Recent Labs  Lab 05/17/21 1540 05/18/21 0214  AST 45* 43*  ALT 31 30  ALKPHOS 105 103  BILITOT 0.7 0.8  PROT 7.2 7.5  ALBUMIN 3.2* 3.2*   No results for input(s): LIPASE, AMYLASE in the last 168 hours. Recent Labs  Lab 05/17/21 1905  AMMONIA <10   CBC: Recent Labs  Lab 05/17/21 1540 05/18/21 0214 05/19/21 0220 05/20/21 1905 05/21/21 0550 05/22/21 0237 05/23/21 1122  WBC 8.0 5.8 3.9* 6.8 4.1 13.1* 9.8  NEUTROABS 5.3 3.3  --  5.0  --   --   --   HGB 14.1 15.1 13.7 14.0 13.7 14.2 14.1  HCT 43.4 46.3 41.5 42.3 40.8 42.2 41.1  MCV 98.9 98.5 96.5 97.5 96.5 95.5 94.5  PLT 265 252 215 214 194 207 171    Cardiac Enzymes: No results for input(s): CKTOTAL, CKMB, CKMBINDEX, TROPONINI in the last 168 hours. BNP: Invalid input(s): POCBNP CBG: No results for input(s): GLUCAP in the last 168 hours. D-Dimer No results for input(s): DDIMER in the last 72 hours. Hgb A1c No results for input(s): HGBA1C in the last 72 hours. Lipid Profile No results for input(s): CHOL, HDL, LDLCALC, TRIG, CHOLHDL, LDLDIRECT in the last  72 hours. Thyroid function studies No results for input(s): TSH, T4TOTAL, T3FREE, THYROIDAB in the last 72 hours.  Invalid input(s): FREET3 Anemia work up No results for input(s): VITAMINB12, FOLATE, FERRITIN, TIBC, IRON, RETICCTPCT in the last 72 hours. Urinalysis    Component Value Date/Time   COLORURINE YELLOW 05/17/2021 1533   APPEARANCEUR CLEAR 05/17/2021 1533   LABSPEC 1.025 05/17/2021 1533   PHURINE 6.0 05/17/2021 1533   GLUCOSEU >=500 (A) 05/17/2021 1533   HGBUR LARGE (A) 05/17/2021 1533   BILIRUBINUR NEGATIVE 05/17/2021 1533   KETONESUR NEGATIVE 05/17/2021 1533   PROTEINUR NEGATIVE 05/17/2021 1533   UROBILINOGEN 0.2 12/01/2013 1410   NITRITE NEGATIVE 05/17/2021 1533   LEUKOCYTESUR TRACE (A) 05/17/2021 1533   Sepsis Labs Invalid input(s): PROCALCITONIN,  WBC,  LACTICIDVEN Microbiology Recent Results (from the past 240 hour(s))  Urine Culture     Status: Abnormal   Collection Time: 05/17/21  6:36 PM   Specimen: Urine, Clean Catch  Result Value Ref Range Status   Specimen Description URINE, CLEAN CATCH  Final   Special Requests   Final    NONE Performed at [facility], [address], [place], [place] [address]    Culture MULTIPLE SPECIES PRESENT, SUGGEST RECOLLECTION (A)  Final   Report Status 05/19/2021 FINAL  Final  Resp Panel by RT-PCR (Flu A&B, Covid) Nasopharyngeal Swab     Status: Abnormal   Collection Time: 05/17/21  8:08 PM   Specimen: Nasopharyngeal Swab; Nasopharyngeal(NP) swabs in vial transport medium  Result Value Ref Range Status    SARS Coronavirus 2 by RT PCR NEGATIVE NEGATIVE Final    Comment: (NOTE) SARS-CoV-2 target nucleic acids are NOT DETECTED.  The SARS-CoV-2 RNA is generally detectable in upper respiratory specimens during the acute phase of infection. The lowest concentration of SARS-CoV-2 viral copies this assay can detect is 138 copies/mL. A negative result does not preclude SARS-Cov-2 infection and should not be used as the sole basis for treatment or other patient management decisions. A negative result may occur with  improper specimen collection/handling, submission of specimen other than nasopharyngeal swab, presence of viral mutation(s) within the areas targeted by this assay, and inadequate number of viral copies(<138 copies/mL). A negative result must be combined with clinical observations, patient history, and epidemiological information. The expected result is Negative.  Fact Sheet for Patients:  BloggerCourse.com  Fact Sheet for Healthcare Providers:  SeriousBroker.it  This test is no t yet approved or cleared by the [place] FDA and  has been authorized for detection and/or diagnosis of SARS-CoV-2 by FDA under an Emergency Use Authorization (EUA). This EUA will remain  in effect (meaning this test can be used) for the duration of the COVID-19 declaration under Section 564(b)(1) of the Act, 21 U.S.C.section 360bbb-3(b)(1), unless the authorization is terminated  or revoked sooner.       Influenza A by PCR POSITIVE (A) NEGATIVE Final   Influenza B by PCR NEGATIVE NEGATIVE Final    Comment: (NOTE) The Xpert Xpress SARS-CoV-2/FLU/RSV plus assay is intended as an aid in the diagnosis of influenza from Nasopharyngeal swab specimens and should not be used as a sole basis for treatment. Nasal washings and aspirates are unacceptable for Xpert Xpress SARS-CoV-2/FLU/RSV testing.  Fact Sheet for  Patients: BloggerCourse.com  Fact Sheet for Healthcare Providers: SeriousBroker.it  This test is not yet approved or cleared by the [place] FDA and has been authorized for detection and/or diagnosis of SARS-CoV-2 by FDA under an Emergency Use Authorization (EUA).  This EUA will remain in effect (meaning this test can be used) for the duration of the COVID-19 declaration under Section 564(b)(1) of the Act, 21 U.S.C. section 360bbb-3(b)(1), unless the authorization is terminated or revoked.  Performed at [facility], [place] [address], [place], [place] [address]   Culture, blood (routine x 2)     Status: None   Collection Time: 05/18/21  2:26 AM   Specimen: BLOOD  Result Value Ref Range Status   Specimen Description BLOOD LEFT ANTECUBITAL  Final   Special Requests   Final    BOTTLES DRAWN AEROBIC AND ANAEROBIC Blood Culture adequate volume   Culture   Final    NO GROWTH 5 DAYS Performed at [facility], [place] [address] [place], [place] [address]    Report Status 05/23/2021 FINAL  Final  Culture, blood (routine x 2)     Status: None   Collection Time: 05/18/21  2:31 AM   Specimen: BLOOD RIGHT HAND  Result Value Ref Range Status   Specimen Description BLOOD RIGHT HAND  Final   Special Requests   Final    BOTTLES DRAWN AEROBIC AND ANAEROBIC Blood Culture results may not be optimal due to an inadequate volume of blood received in culture bottles   Culture   Final    NO GROWTH 5 DAYS Performed at [facility], [place] [address], [place], [place] [address]    Report Status 05/23/2021 FINAL  Final  Resp Panel by RT-PCR (Flu A&B, Covid) Nasopharyngeal Swab     Status: Abnormal   Collection Time: 05/21/21 12:52 AM   Specimen: Nasopharyngeal Swab; Nasopharyngeal(NP) swabs in vial transport medium  Result Value Ref Range Status   SARS Coronavirus 2 by RT PCR NEGATIVE NEGATIVE Final    Comment: (NOTE) SARS-CoV-2  target nucleic acids are NOT DETECTED.  The SARS-CoV-2 RNA is generally detectable in upper respiratory specimens during the acute phase of infection. The lowest concentration of SARS-CoV-2 viral copies this assay can detect is 138 copies/mL. A negative result does not preclude SARS-Cov-2 infection and should not be used as the sole basis for treatment or other patient management decisions. A negative result may occur with  improper specimen collection/handling, submission of specimen other than nasopharyngeal swab, presence of viral mutation(s) within the areas targeted by this assay, and inadequate number of viral copies(<138 copies/mL). A negative result must be combined with clinical observations, patient history, and epidemiological information. The expected result is Negative.  Fact Sheet for Patients:  EntrepreneurPulse.com.au  Fact Sheet for Healthcare Providers:  IncredibleEmployment.be  This test is no t yet approved or cleared by the [place] FDA and  has been authorized for detection and/or diagnosis of SARS-CoV-2 by FDA under an Emergency Use Authorization (EUA). This EUA will remain  in effect (meaning this test can be used) for the duration of the COVID-19 declaration under Section 564(b)(1) of the Act, 21 U.S.C.section 360bbb-3(b)(1), unless the authorization is terminated  or revoked sooner.       Influenza A by PCR POSITIVE (A) NEGATIVE Final   Influenza B by PCR NEGATIVE NEGATIVE Final    Comment: (NOTE) The Xpert Xpress SARS-CoV-2/FLU/RSV plus assay is intended as an aid in the diagnosis of influenza from Nasopharyngeal swab specimens and should not be used as a sole basis for treatment. Nasal washings and aspirates are unacceptable for Xpert Xpress SARS-CoV-2/FLU/RSV testing.  Fact Sheet for Patients: EntrepreneurPulse.com.au  Fact Sheet for Healthcare  Providers: IncredibleEmployment.be  This test  is not yet approved or cleared by the [place] and has been authorized for detection and/or diagnosis of SARS-CoV-2 by FDA under an Emergency Use Authorization (EUA). This EUA will remain in effect (meaning this test can be used) for the duration of the COVID-19 declaration under Section 564(b)(1) of the Act, 21 U.S.C. section 360bbb-3(b)(1), unless the authorization is terminated or revoked.  Performed at [facility], [place] [address] [place], [place] [address]      Time coordinating discharge: Over 30 minutes  SIGNED:   Mckinley Jewel, MD  [facility] 05/23/2021, 1:14 PM Pager   If 7PM-7AM, please contact night-coverage [email or web address]

## 2021-05-23 NOTE — Discharge Instructions (Addendum)
Information on my medicine - ELIQUIS (apixaban)  This medication education was reviewed with me or my healthcare representative as part of my discharge preparation.    Why was Eliquis prescribed for you? Eliquis was prescribed for you to reduce the risk of a blood clot forming that can cause a stroke if you have a medical condition called atrial fibrillation (a type of irregular heartbeat).  Also a blood clot in the right leg, unknown how long it has been present, and to reduce the risk of further blood clots.  What do You need to know about Eliquis ? Take your Eliquis TWICE DAILY - one tablet in the morning and one tablet in the evening with or without food. If you have difficulty swallowing the tablet whole please discuss with your pharmacist how to take the medication safely.  Take Eliquis exactly as prescribed by your doctor and DO NOT stop taking Eliquis without talking to the doctor who prescribed the medication.  Stopping may increase your risk of developing a stroke.  Refill your prescription before you run out.  After discharge, you should have regular check-up appointments with your healthcare provider that is prescribing your Eliquis.  In the future your dose may need to be changed if your kidney function or weight changes by a significant amount or as you get older.  What do you do if you miss a dose? If you miss a dose, take it as soon as you remember on the same day and resume taking twice daily.  Do not take more than one dose of ELIQUIS at the same time to make up a missed dose.  Important Safety Information A possible side effect of Eliquis is bleeding. You should call your healthcare provider right away if you experience any of the following: Bleeding from an injury or your nose that does not stop. Unusual colored urine (red or dark brown) or unusual colored stools (red or black). Unusual bruising for unknown reasons. A serious fall or if you hit your head (even if  there is no bleeding).  Some medicines may interact with Eliquis and might increase your risk of bleeding or clotting while on Eliquis. To help avoid this, consult your healthcare provider or pharmacist prior to using any new prescription or non-prescription medications, including herbals, vitamins, non-steroidal anti-inflammatory drugs (NSAIDs) and supplements.  This website has more information on Eliquis (apixaban): http://[email or web address].com/eliquis/home

## 2021-05-23 NOTE — Progress Notes (Signed)
Nursing Discharge Note   Admit Date: 05/20/2021  Discharge date: 05/23/2021   Edgar Thomas is to be discharged home per MD order.  AVS completed. Reviewed with patient and wife at bedside. Highlighted copy provided for patient to take home.  Patient/caregiver able to verbalize understanding of discharge instructions. PIV removed. Patient stable upon discharge.     Discharge Instructions      Information on my medicine - ELIQUIS (apixaban)  This medication education was reviewed with me or my healthcare representative as part of my discharge preparation.    Why was Eliquis prescribed for you? Eliquis was prescribed for you to reduce the risk of a blood clot forming that can cause a stroke if you have a medical condition called atrial fibrillation (a type of irregular heartbeat).  Also a blood clot in the right leg, unknown how long it has been present, and to reduce the risk of further blood clots.  What do You need to know about Eliquis ? Take your Eliquis TWICE DAILY - one tablet in the morning and one tablet in the evening with or without food. If you have difficulty swallowing the tablet whole please discuss with your pharmacist how to take the medication safely.  Take Eliquis exactly as prescribed by your doctor and DO NOT stop taking Eliquis without talking to the doctor who prescribed the medication.  Stopping may increase your risk of developing a stroke.  Refill your prescription before you run out.  After discharge, you should have regular check-up appointments with your healthcare provider that is prescribing your Eliquis.  In the future your dose may need to be changed if your kidney function or weight changes by a significant amount or as you get older.  What do you do if you miss a dose? If you miss a dose, take it as soon as you remember on the same day and resume taking twice daily.  Do not take more than one dose of ELIQUIS at the same time to make up a missed  dose.  Important Safety Information A possible side effect of Eliquis is bleeding. You should call your healthcare provider right away if you experience any of the following: Bleeding from an injury or your nose that does not stop. Unusual colored urine (red or dark brown) or unusual colored stools (red or black). Unusual bruising for unknown reasons. A serious fall or if you hit your head (even if there is no bleeding).  Some medicines may interact with Eliquis and might increase your risk of bleeding or clotting while on Eliquis. To help avoid this, consult your healthcare provider or pharmacist prior to using any new prescription or non-prescription medications, including herbals, vitamins, non-steroidal anti-inflammatory drugs (NSAIDs) and supplements.  This website has more information on Eliquis (apixaban): http://[email or web address].com/eliquis/home        Allergies as of 05/23/2021       Reactions   Contrast Media [iodinated Contrast Media] Rash   19 years ago at [facility]   Metrizamide Rash   19 years ago at [facility] (Amipaque)   Other Other (See Comments)   Patient preference: NO MEAT OR DAIRY!!   Amiodarone Other (See Comments)   Makes the patient walk sideways and he falls   Ciprofloxacin Other (See Comments)   Was told by MD to "not take"   Tramadol Nausea And Vomiting        Medication List     STOP taking these medications    cephALEXin 500  MG capsule Commonly known as: KEFLEX   oseltamivir 30 MG capsule Commonly known as: TAMIFLU       TAKE these medications    benzonatate 100 MG capsule Commonly known as: Tessalon Perles Take 1 capsule (100 mg total) by mouth 3 (three) times daily.   Crestor 20 MG tablet Generic drug: rosuvastatin Take 20 mg by mouth every morning.   diltiazem 120 MG 24 hr capsule Commonly known as: CARDIZEM CD Take 120 mg by mouth every morning.   dofetilide 125 MCG capsule Commonly known as:  TIKOSYN Take 125 mcg by mouth 2 (two) times daily.   Eliquis 5 MG Tabs tablet Generic drug: apixaban Take 5 mg by mouth 2 (two) times daily.   ferrous sulfate 325 (65 FE) MG tablet Take 325 mg by mouth daily with breakfast.   furosemide 40 MG tablet Commonly known as: LASIX Take 20 mg by mouth daily.   guaiFENesin 600 MG 12 hr tablet Commonly known as: MUCINEX Take 2 tablets (1,200 mg total) by mouth 2 (two) times daily.   Jardiance 10 MG Tabs tablet Generic drug: empagliflozin Take 10 mg by mouth every morning.   metoprolol tartrate 25 MG tablet Commonly known as: LOPRESSOR Take 1 & 1/2 tablets (37.5 mg total) by mouth 2 (two) times daily.   multivitamin-lutein Caps capsule Take 1 capsule by mouth daily with breakfast.   One-A-Day Mens 50+ Advantage Tabs Take 1 tablet by mouth daily with breakfast.   nitroGLYCERIN 0.4 MG SL tablet Commonly known as: NITROSTAT Place 0.4 mg under the tongue every 5 (five) minutes as needed for chest pain.   polyethylene glycol 17 g packet Commonly known as: MIRALAX / GLYCOLAX Take 17 g by mouth daily as needed for moderate constipation.   potassium chloride 10 MEQ tablet Commonly known as: KLOR-CON M Take 1 tablet (10 mEq total) by mouth daily.   Vitamin D-3 25 MCG (1000 UT) Caps Take 1,000 Units by mouth daily after breakfast.         Discharge Instructions/ Education: Discharge instructions given to patient/family with verbalized understanding. Discharge education completed with patient/family including: follow up instructions, medication list, discharge activities, and limitations if indicated. Patient escorted via wheelchair to lobby and discharged home via private automobile.

## 2021-05-30 DIAGNOSIS — Z8673 Personal history of transient ischemic attack (TIA), and cerebral infarction without residual deficits: Secondary | ICD-10-CM | POA: Diagnosis not present

## 2021-05-30 DIAGNOSIS — N390 Urinary tract infection, site not specified: Secondary | ICD-10-CM | POA: Diagnosis not present

## 2021-05-30 DIAGNOSIS — E782 Mixed hyperlipidemia: Secondary | ICD-10-CM | POA: Diagnosis not present

## 2021-05-30 DIAGNOSIS — I5042 Chronic combined systolic (congestive) and diastolic (congestive) heart failure: Secondary | ICD-10-CM | POA: Diagnosis not present

## 2021-05-30 DIAGNOSIS — Z951 Presence of aortocoronary bypass graft: Secondary | ICD-10-CM | POA: Diagnosis not present

## 2021-05-30 DIAGNOSIS — I482 Chronic atrial fibrillation, unspecified: Secondary | ICD-10-CM | POA: Diagnosis not present

## 2021-05-30 DIAGNOSIS — R6510 Systemic inflammatory response syndrome (SIRS) of non-infectious origin without acute organ dysfunction: Secondary | ICD-10-CM | POA: Diagnosis not present

## 2021-05-30 DIAGNOSIS — I2510 Atherosclerotic heart disease of native coronary artery without angina pectoris: Secondary | ICD-10-CM | POA: Diagnosis not present

## 2021-05-30 DIAGNOSIS — J1100 Influenza due to unidentified influenza virus with unspecified type of pneumonia: Secondary | ICD-10-CM | POA: Diagnosis not present

## 2021-05-30 DIAGNOSIS — Z6820 Body mass index (BMI) 20.0-20.9, adult: Secondary | ICD-10-CM | POA: Diagnosis not present

## 2021-05-30 DIAGNOSIS — K219 Gastro-esophageal reflux disease without esophagitis: Secondary | ICD-10-CM | POA: Diagnosis not present

## 2021-05-30 DIAGNOSIS — I110 Hypertensive heart disease with heart failure: Secondary | ICD-10-CM | POA: Diagnosis not present

## 2021-05-30 DIAGNOSIS — N210 Calculus in bladder: Secondary | ICD-10-CM | POA: Diagnosis not present

## 2021-06-01 DIAGNOSIS — N210 Calculus in bladder: Secondary | ICD-10-CM | POA: Diagnosis not present

## 2021-06-01 DIAGNOSIS — K219 Gastro-esophageal reflux disease without esophagitis: Secondary | ICD-10-CM | POA: Diagnosis not present

## 2021-06-01 DIAGNOSIS — I5042 Chronic combined systolic (congestive) and diastolic (congestive) heart failure: Secondary | ICD-10-CM | POA: Diagnosis not present

## 2021-06-01 DIAGNOSIS — E782 Mixed hyperlipidemia: Secondary | ICD-10-CM | POA: Diagnosis not present

## 2021-06-01 DIAGNOSIS — J1100 Influenza due to unidentified influenza virus with unspecified type of pneumonia: Secondary | ICD-10-CM | POA: Diagnosis not present

## 2021-06-01 DIAGNOSIS — Z951 Presence of aortocoronary bypass graft: Secondary | ICD-10-CM | POA: Diagnosis not present

## 2021-06-01 DIAGNOSIS — Z6820 Body mass index (BMI) 20.0-20.9, adult: Secondary | ICD-10-CM | POA: Diagnosis not present

## 2021-06-01 DIAGNOSIS — N390 Urinary tract infection, site not specified: Secondary | ICD-10-CM | POA: Diagnosis not present

## 2021-06-01 DIAGNOSIS — Z8673 Personal history of transient ischemic attack (TIA), and cerebral infarction without residual deficits: Secondary | ICD-10-CM | POA: Diagnosis not present

## 2021-06-01 DIAGNOSIS — I2510 Atherosclerotic heart disease of native coronary artery without angina pectoris: Secondary | ICD-10-CM | POA: Diagnosis not present

## 2021-06-01 DIAGNOSIS — I482 Chronic atrial fibrillation, unspecified: Secondary | ICD-10-CM | POA: Diagnosis not present

## 2021-06-01 DIAGNOSIS — I110 Hypertensive heart disease with heart failure: Secondary | ICD-10-CM | POA: Diagnosis not present

## 2021-06-01 DIAGNOSIS — R6510 Systemic inflammatory response syndrome (SIRS) of non-infectious origin without acute organ dysfunction: Secondary | ICD-10-CM | POA: Diagnosis not present

## 2021-06-09 DIAGNOSIS — K219 Gastro-esophageal reflux disease without esophagitis: Secondary | ICD-10-CM | POA: Diagnosis not present

## 2021-06-09 DIAGNOSIS — J1100 Influenza due to unidentified influenza virus with unspecified type of pneumonia: Secondary | ICD-10-CM | POA: Diagnosis not present

## 2021-06-09 DIAGNOSIS — N390 Urinary tract infection, site not specified: Secondary | ICD-10-CM | POA: Diagnosis not present

## 2021-06-09 DIAGNOSIS — N210 Calculus in bladder: Secondary | ICD-10-CM | POA: Diagnosis not present

## 2021-06-09 DIAGNOSIS — I5042 Chronic combined systolic (congestive) and diastolic (congestive) heart failure: Secondary | ICD-10-CM | POA: Diagnosis not present

## 2021-06-09 DIAGNOSIS — Z6820 Body mass index (BMI) 20.0-20.9, adult: Secondary | ICD-10-CM | POA: Diagnosis not present

## 2021-06-09 DIAGNOSIS — Z951 Presence of aortocoronary bypass graft: Secondary | ICD-10-CM | POA: Diagnosis not present

## 2021-06-09 DIAGNOSIS — I2510 Atherosclerotic heart disease of native coronary artery without angina pectoris: Secondary | ICD-10-CM | POA: Diagnosis not present

## 2021-06-09 DIAGNOSIS — E782 Mixed hyperlipidemia: Secondary | ICD-10-CM | POA: Diagnosis not present

## 2021-06-09 DIAGNOSIS — R6510 Systemic inflammatory response syndrome (SIRS) of non-infectious origin without acute organ dysfunction: Secondary | ICD-10-CM | POA: Diagnosis not present

## 2021-06-09 DIAGNOSIS — Z8673 Personal history of transient ischemic attack (TIA), and cerebral infarction without residual deficits: Secondary | ICD-10-CM | POA: Diagnosis not present

## 2021-06-09 DIAGNOSIS — I110 Hypertensive heart disease with heart failure: Secondary | ICD-10-CM | POA: Diagnosis not present

## 2021-06-09 DIAGNOSIS — I482 Chronic atrial fibrillation, unspecified: Secondary | ICD-10-CM | POA: Diagnosis not present

## 2021-06-14 ENCOUNTER — Other Ambulatory Visit (HOSPITAL_COMMUNITY): Payer: Self-pay

## 2021-06-14 DIAGNOSIS — Z8673 Personal history of transient ischemic attack (TIA), and cerebral infarction without residual deficits: Secondary | ICD-10-CM | POA: Diagnosis not present

## 2021-06-14 DIAGNOSIS — Z951 Presence of aortocoronary bypass graft: Secondary | ICD-10-CM | POA: Diagnosis not present

## 2021-06-14 DIAGNOSIS — E782 Mixed hyperlipidemia: Secondary | ICD-10-CM | POA: Diagnosis not present

## 2021-06-14 DIAGNOSIS — R6510 Systemic inflammatory response syndrome (SIRS) of non-infectious origin without acute organ dysfunction: Secondary | ICD-10-CM | POA: Diagnosis not present

## 2021-06-14 DIAGNOSIS — N210 Calculus in bladder: Secondary | ICD-10-CM | POA: Diagnosis not present

## 2021-06-14 DIAGNOSIS — J1100 Influenza due to unidentified influenza virus with unspecified type of pneumonia: Secondary | ICD-10-CM | POA: Diagnosis not present

## 2021-06-14 DIAGNOSIS — I2510 Atherosclerotic heart disease of native coronary artery without angina pectoris: Secondary | ICD-10-CM | POA: Diagnosis not present

## 2021-06-14 DIAGNOSIS — N390 Urinary tract infection, site not specified: Secondary | ICD-10-CM | POA: Diagnosis not present

## 2021-06-14 DIAGNOSIS — I110 Hypertensive heart disease with heart failure: Secondary | ICD-10-CM | POA: Diagnosis not present

## 2021-06-14 DIAGNOSIS — I5042 Chronic combined systolic (congestive) and diastolic (congestive) heart failure: Secondary | ICD-10-CM | POA: Diagnosis not present

## 2021-06-14 DIAGNOSIS — K219 Gastro-esophageal reflux disease without esophagitis: Secondary | ICD-10-CM | POA: Diagnosis not present

## 2021-06-14 DIAGNOSIS — I482 Chronic atrial fibrillation, unspecified: Secondary | ICD-10-CM | POA: Diagnosis not present

## 2021-06-14 DIAGNOSIS — Z6820 Body mass index (BMI) 20.0-20.9, adult: Secondary | ICD-10-CM | POA: Diagnosis not present

## 2021-06-16 DIAGNOSIS — N210 Calculus in bladder: Secondary | ICD-10-CM | POA: Diagnosis not present

## 2021-06-16 DIAGNOSIS — I110 Hypertensive heart disease with heart failure: Secondary | ICD-10-CM | POA: Diagnosis not present

## 2021-06-16 DIAGNOSIS — Z8673 Personal history of transient ischemic attack (TIA), and cerebral infarction without residual deficits: Secondary | ICD-10-CM | POA: Diagnosis not present

## 2021-06-16 DIAGNOSIS — I2510 Atherosclerotic heart disease of native coronary artery without angina pectoris: Secondary | ICD-10-CM | POA: Diagnosis not present

## 2021-06-16 DIAGNOSIS — E782 Mixed hyperlipidemia: Secondary | ICD-10-CM | POA: Diagnosis not present

## 2021-06-16 DIAGNOSIS — N390 Urinary tract infection, site not specified: Secondary | ICD-10-CM | POA: Diagnosis not present

## 2021-06-16 DIAGNOSIS — I482 Chronic atrial fibrillation, unspecified: Secondary | ICD-10-CM | POA: Diagnosis not present

## 2021-06-16 DIAGNOSIS — Z6820 Body mass index (BMI) 20.0-20.9, adult: Secondary | ICD-10-CM | POA: Diagnosis not present

## 2021-06-16 DIAGNOSIS — Z951 Presence of aortocoronary bypass graft: Secondary | ICD-10-CM | POA: Diagnosis not present

## 2021-06-16 DIAGNOSIS — R6510 Systemic inflammatory response syndrome (SIRS) of non-infectious origin without acute organ dysfunction: Secondary | ICD-10-CM | POA: Diagnosis not present

## 2021-06-16 DIAGNOSIS — K219 Gastro-esophageal reflux disease without esophagitis: Secondary | ICD-10-CM | POA: Diagnosis not present

## 2021-06-16 DIAGNOSIS — I5042 Chronic combined systolic (congestive) and diastolic (congestive) heart failure: Secondary | ICD-10-CM | POA: Diagnosis not present

## 2021-06-16 DIAGNOSIS — J1100 Influenza due to unidentified influenza virus with unspecified type of pneumonia: Secondary | ICD-10-CM | POA: Diagnosis not present

## 2021-06-21 DIAGNOSIS — R600 Localized edema: Secondary | ICD-10-CM | POA: Diagnosis not present

## 2021-06-21 DIAGNOSIS — B0229 Other postherpetic nervous system involvement: Secondary | ICD-10-CM | POA: Diagnosis not present

## 2021-06-21 DIAGNOSIS — I2510 Atherosclerotic heart disease of native coronary artery without angina pectoris: Secondary | ICD-10-CM | POA: Diagnosis not present

## 2021-06-21 DIAGNOSIS — I10 Essential (primary) hypertension: Secondary | ICD-10-CM | POA: Diagnosis not present

## 2021-06-23 DIAGNOSIS — I482 Chronic atrial fibrillation, unspecified: Secondary | ICD-10-CM | POA: Diagnosis not present

## 2021-06-23 DIAGNOSIS — K219 Gastro-esophageal reflux disease without esophagitis: Secondary | ICD-10-CM | POA: Diagnosis not present

## 2021-06-23 DIAGNOSIS — J1100 Influenza due to unidentified influenza virus with unspecified type of pneumonia: Secondary | ICD-10-CM | POA: Diagnosis not present

## 2021-06-23 DIAGNOSIS — I110 Hypertensive heart disease with heart failure: Secondary | ICD-10-CM | POA: Diagnosis not present

## 2021-06-23 DIAGNOSIS — E782 Mixed hyperlipidemia: Secondary | ICD-10-CM | POA: Diagnosis not present

## 2021-06-23 DIAGNOSIS — I5042 Chronic combined systolic (congestive) and diastolic (congestive) heart failure: Secondary | ICD-10-CM | POA: Diagnosis not present

## 2021-06-23 DIAGNOSIS — Z951 Presence of aortocoronary bypass graft: Secondary | ICD-10-CM | POA: Diagnosis not present

## 2021-06-23 DIAGNOSIS — N210 Calculus in bladder: Secondary | ICD-10-CM | POA: Diagnosis not present

## 2021-06-23 DIAGNOSIS — N390 Urinary tract infection, site not specified: Secondary | ICD-10-CM | POA: Diagnosis not present

## 2021-06-23 DIAGNOSIS — I2510 Atherosclerotic heart disease of native coronary artery without angina pectoris: Secondary | ICD-10-CM | POA: Diagnosis not present

## 2021-06-23 DIAGNOSIS — Z6820 Body mass index (BMI) 20.0-20.9, adult: Secondary | ICD-10-CM | POA: Diagnosis not present

## 2021-06-23 DIAGNOSIS — Z8673 Personal history of transient ischemic attack (TIA), and cerebral infarction without residual deficits: Secondary | ICD-10-CM | POA: Diagnosis not present

## 2021-06-23 DIAGNOSIS — R6510 Systemic inflammatory response syndrome (SIRS) of non-infectious origin without acute organ dysfunction: Secondary | ICD-10-CM | POA: Diagnosis not present

## 2021-06-28 DIAGNOSIS — E782 Mixed hyperlipidemia: Secondary | ICD-10-CM | POA: Diagnosis not present

## 2021-06-28 DIAGNOSIS — N210 Calculus in bladder: Secondary | ICD-10-CM | POA: Diagnosis not present

## 2021-06-28 DIAGNOSIS — I5042 Chronic combined systolic (congestive) and diastolic (congestive) heart failure: Secondary | ICD-10-CM | POA: Diagnosis not present

## 2021-06-28 DIAGNOSIS — J1100 Influenza due to unidentified influenza virus with unspecified type of pneumonia: Secondary | ICD-10-CM | POA: Diagnosis not present

## 2021-06-28 DIAGNOSIS — N390 Urinary tract infection, site not specified: Secondary | ICD-10-CM | POA: Diagnosis not present

## 2021-06-28 DIAGNOSIS — R6510 Systemic inflammatory response syndrome (SIRS) of non-infectious origin without acute organ dysfunction: Secondary | ICD-10-CM | POA: Diagnosis not present

## 2021-06-28 DIAGNOSIS — Z6820 Body mass index (BMI) 20.0-20.9, adult: Secondary | ICD-10-CM | POA: Diagnosis not present

## 2021-06-28 DIAGNOSIS — Z8673 Personal history of transient ischemic attack (TIA), and cerebral infarction without residual deficits: Secondary | ICD-10-CM | POA: Diagnosis not present

## 2021-06-28 DIAGNOSIS — K219 Gastro-esophageal reflux disease without esophagitis: Secondary | ICD-10-CM | POA: Diagnosis not present

## 2021-06-28 DIAGNOSIS — I110 Hypertensive heart disease with heart failure: Secondary | ICD-10-CM | POA: Diagnosis not present

## 2021-06-28 DIAGNOSIS — I2510 Atherosclerotic heart disease of native coronary artery without angina pectoris: Secondary | ICD-10-CM | POA: Diagnosis not present

## 2021-06-28 DIAGNOSIS — I482 Chronic atrial fibrillation, unspecified: Secondary | ICD-10-CM | POA: Diagnosis not present

## 2021-06-28 DIAGNOSIS — Z951 Presence of aortocoronary bypass graft: Secondary | ICD-10-CM | POA: Diagnosis not present

## 2021-07-04 DIAGNOSIS — K219 Gastro-esophageal reflux disease without esophagitis: Secondary | ICD-10-CM | POA: Diagnosis not present

## 2021-07-04 DIAGNOSIS — J1100 Influenza due to unidentified influenza virus with unspecified type of pneumonia: Secondary | ICD-10-CM | POA: Diagnosis not present

## 2021-07-04 DIAGNOSIS — Z951 Presence of aortocoronary bypass graft: Secondary | ICD-10-CM | POA: Diagnosis not present

## 2021-07-04 DIAGNOSIS — E782 Mixed hyperlipidemia: Secondary | ICD-10-CM | POA: Diagnosis not present

## 2021-07-04 DIAGNOSIS — Z6820 Body mass index (BMI) 20.0-20.9, adult: Secondary | ICD-10-CM | POA: Diagnosis not present

## 2021-07-04 DIAGNOSIS — I110 Hypertensive heart disease with heart failure: Secondary | ICD-10-CM | POA: Diagnosis not present

## 2021-07-04 DIAGNOSIS — I5042 Chronic combined systolic (congestive) and diastolic (congestive) heart failure: Secondary | ICD-10-CM | POA: Diagnosis not present

## 2021-07-04 DIAGNOSIS — N210 Calculus in bladder: Secondary | ICD-10-CM | POA: Diagnosis not present

## 2021-07-04 DIAGNOSIS — I482 Chronic atrial fibrillation, unspecified: Secondary | ICD-10-CM | POA: Diagnosis not present

## 2021-07-04 DIAGNOSIS — I2510 Atherosclerotic heart disease of native coronary artery without angina pectoris: Secondary | ICD-10-CM | POA: Diagnosis not present

## 2021-07-04 DIAGNOSIS — N390 Urinary tract infection, site not specified: Secondary | ICD-10-CM | POA: Diagnosis not present

## 2021-07-04 DIAGNOSIS — R6510 Systemic inflammatory response syndrome (SIRS) of non-infectious origin without acute organ dysfunction: Secondary | ICD-10-CM | POA: Diagnosis not present

## 2021-07-04 DIAGNOSIS — Z8673 Personal history of transient ischemic attack (TIA), and cerebral infarction without residual deficits: Secondary | ICD-10-CM | POA: Diagnosis not present

## 2021-07-07 DIAGNOSIS — N390 Urinary tract infection, site not specified: Secondary | ICD-10-CM | POA: Diagnosis not present

## 2021-07-07 DIAGNOSIS — R6510 Systemic inflammatory response syndrome (SIRS) of non-infectious origin without acute organ dysfunction: Secondary | ICD-10-CM | POA: Diagnosis not present

## 2021-07-07 DIAGNOSIS — J1100 Influenza due to unidentified influenza virus with unspecified type of pneumonia: Secondary | ICD-10-CM | POA: Diagnosis not present

## 2021-07-07 DIAGNOSIS — Z951 Presence of aortocoronary bypass graft: Secondary | ICD-10-CM | POA: Diagnosis not present

## 2021-07-07 DIAGNOSIS — K219 Gastro-esophageal reflux disease without esophagitis: Secondary | ICD-10-CM | POA: Diagnosis not present

## 2021-07-07 DIAGNOSIS — E782 Mixed hyperlipidemia: Secondary | ICD-10-CM | POA: Diagnosis not present

## 2021-07-07 DIAGNOSIS — Z6820 Body mass index (BMI) 20.0-20.9, adult: Secondary | ICD-10-CM | POA: Diagnosis not present

## 2021-07-07 DIAGNOSIS — I5042 Chronic combined systolic (congestive) and diastolic (congestive) heart failure: Secondary | ICD-10-CM | POA: Diagnosis not present

## 2021-07-07 DIAGNOSIS — N210 Calculus in bladder: Secondary | ICD-10-CM | POA: Diagnosis not present

## 2021-07-07 DIAGNOSIS — Z8673 Personal history of transient ischemic attack (TIA), and cerebral infarction without residual deficits: Secondary | ICD-10-CM | POA: Diagnosis not present

## 2021-07-07 DIAGNOSIS — I110 Hypertensive heart disease with heart failure: Secondary | ICD-10-CM | POA: Diagnosis not present

## 2021-07-07 DIAGNOSIS — I2510 Atherosclerotic heart disease of native coronary artery without angina pectoris: Secondary | ICD-10-CM | POA: Diagnosis not present

## 2021-07-07 DIAGNOSIS — I482 Chronic atrial fibrillation, unspecified: Secondary | ICD-10-CM | POA: Diagnosis not present

## 2021-07-12 DIAGNOSIS — N390 Urinary tract infection, site not specified: Secondary | ICD-10-CM | POA: Diagnosis not present

## 2021-07-12 DIAGNOSIS — Z8673 Personal history of transient ischemic attack (TIA), and cerebral infarction without residual deficits: Secondary | ICD-10-CM | POA: Diagnosis not present

## 2021-07-12 DIAGNOSIS — I5042 Chronic combined systolic (congestive) and diastolic (congestive) heart failure: Secondary | ICD-10-CM | POA: Diagnosis not present

## 2021-07-12 DIAGNOSIS — E782 Mixed hyperlipidemia: Secondary | ICD-10-CM | POA: Diagnosis not present

## 2021-07-12 DIAGNOSIS — Z951 Presence of aortocoronary bypass graft: Secondary | ICD-10-CM | POA: Diagnosis not present

## 2021-07-12 DIAGNOSIS — N210 Calculus in bladder: Secondary | ICD-10-CM | POA: Diagnosis not present

## 2021-07-12 DIAGNOSIS — J1100 Influenza due to unidentified influenza virus with unspecified type of pneumonia: Secondary | ICD-10-CM | POA: Diagnosis not present

## 2021-07-12 DIAGNOSIS — I482 Chronic atrial fibrillation, unspecified: Secondary | ICD-10-CM | POA: Diagnosis not present

## 2021-07-12 DIAGNOSIS — Z6820 Body mass index (BMI) 20.0-20.9, adult: Secondary | ICD-10-CM | POA: Diagnosis not present

## 2021-07-12 DIAGNOSIS — I2510 Atherosclerotic heart disease of native coronary artery without angina pectoris: Secondary | ICD-10-CM | POA: Diagnosis not present

## 2021-07-12 DIAGNOSIS — I110 Hypertensive heart disease with heart failure: Secondary | ICD-10-CM | POA: Diagnosis not present

## 2021-07-12 DIAGNOSIS — R6510 Systemic inflammatory response syndrome (SIRS) of non-infectious origin without acute organ dysfunction: Secondary | ICD-10-CM | POA: Diagnosis not present

## 2021-07-12 DIAGNOSIS — K219 Gastro-esophageal reflux disease without esophagitis: Secondary | ICD-10-CM | POA: Diagnosis not present

## 2021-07-15 DIAGNOSIS — N210 Calculus in bladder: Secondary | ICD-10-CM | POA: Diagnosis not present

## 2021-07-15 DIAGNOSIS — I2510 Atherosclerotic heart disease of native coronary artery without angina pectoris: Secondary | ICD-10-CM | POA: Diagnosis not present

## 2021-07-15 DIAGNOSIS — J1100 Influenza due to unidentified influenza virus with unspecified type of pneumonia: Secondary | ICD-10-CM | POA: Diagnosis not present

## 2021-07-15 DIAGNOSIS — N390 Urinary tract infection, site not specified: Secondary | ICD-10-CM | POA: Diagnosis not present

## 2021-07-15 DIAGNOSIS — I482 Chronic atrial fibrillation, unspecified: Secondary | ICD-10-CM | POA: Diagnosis not present

## 2021-07-15 DIAGNOSIS — E782 Mixed hyperlipidemia: Secondary | ICD-10-CM | POA: Diagnosis not present

## 2021-07-15 DIAGNOSIS — Z6820 Body mass index (BMI) 20.0-20.9, adult: Secondary | ICD-10-CM | POA: Diagnosis not present

## 2021-07-15 DIAGNOSIS — Z951 Presence of aortocoronary bypass graft: Secondary | ICD-10-CM | POA: Diagnosis not present

## 2021-07-15 DIAGNOSIS — I5042 Chronic combined systolic (congestive) and diastolic (congestive) heart failure: Secondary | ICD-10-CM | POA: Diagnosis not present

## 2021-07-15 DIAGNOSIS — K219 Gastro-esophageal reflux disease without esophagitis: Secondary | ICD-10-CM | POA: Diagnosis not present

## 2021-07-15 DIAGNOSIS — R6510 Systemic inflammatory response syndrome (SIRS) of non-infectious origin without acute organ dysfunction: Secondary | ICD-10-CM | POA: Diagnosis not present

## 2021-07-15 DIAGNOSIS — Z8673 Personal history of transient ischemic attack (TIA), and cerebral infarction without residual deficits: Secondary | ICD-10-CM | POA: Diagnosis not present

## 2021-07-15 DIAGNOSIS — I110 Hypertensive heart disease with heart failure: Secondary | ICD-10-CM | POA: Diagnosis not present

## 2021-07-20 DIAGNOSIS — Z7901 Long term (current) use of anticoagulants: Secondary | ICD-10-CM | POA: Diagnosis not present

## 2021-07-20 DIAGNOSIS — I2510 Atherosclerotic heart disease of native coronary artery without angina pectoris: Secondary | ICD-10-CM | POA: Diagnosis not present

## 2021-07-20 DIAGNOSIS — Z951 Presence of aortocoronary bypass graft: Secondary | ICD-10-CM | POA: Diagnosis not present

## 2021-07-20 DIAGNOSIS — E782 Mixed hyperlipidemia: Secondary | ICD-10-CM | POA: Diagnosis not present

## 2021-07-20 DIAGNOSIS — M21372 Foot drop, left foot: Secondary | ICD-10-CM | POA: Diagnosis not present

## 2021-07-20 DIAGNOSIS — Z8673 Personal history of transient ischemic attack (TIA), and cerebral infarction without residual deficits: Secondary | ICD-10-CM | POA: Diagnosis not present

## 2021-07-20 DIAGNOSIS — I5042 Chronic combined systolic (congestive) and diastolic (congestive) heart failure: Secondary | ICD-10-CM | POA: Diagnosis not present

## 2021-07-20 DIAGNOSIS — Z6820 Body mass index (BMI) 20.0-20.9, adult: Secondary | ICD-10-CM | POA: Diagnosis not present

## 2021-07-20 DIAGNOSIS — K219 Gastro-esophageal reflux disease without esophagitis: Secondary | ICD-10-CM | POA: Diagnosis not present

## 2021-07-20 DIAGNOSIS — I110 Hypertensive heart disease with heart failure: Secondary | ICD-10-CM | POA: Diagnosis not present

## 2021-07-20 DIAGNOSIS — N210 Calculus in bladder: Secondary | ICD-10-CM | POA: Diagnosis not present

## 2021-07-20 DIAGNOSIS — I482 Chronic atrial fibrillation, unspecified: Secondary | ICD-10-CM | POA: Diagnosis not present

## 2021-07-27 DIAGNOSIS — Z7901 Long term (current) use of anticoagulants: Secondary | ICD-10-CM | POA: Diagnosis not present

## 2021-07-27 DIAGNOSIS — I2510 Atherosclerotic heart disease of native coronary artery without angina pectoris: Secondary | ICD-10-CM | POA: Diagnosis not present

## 2021-07-27 DIAGNOSIS — Z6820 Body mass index (BMI) 20.0-20.9, adult: Secondary | ICD-10-CM | POA: Diagnosis not present

## 2021-07-27 DIAGNOSIS — I110 Hypertensive heart disease with heart failure: Secondary | ICD-10-CM | POA: Diagnosis not present

## 2021-07-27 DIAGNOSIS — Z951 Presence of aortocoronary bypass graft: Secondary | ICD-10-CM | POA: Diagnosis not present

## 2021-07-27 DIAGNOSIS — Z8673 Personal history of transient ischemic attack (TIA), and cerebral infarction without residual deficits: Secondary | ICD-10-CM | POA: Diagnosis not present

## 2021-07-27 DIAGNOSIS — K219 Gastro-esophageal reflux disease without esophagitis: Secondary | ICD-10-CM | POA: Diagnosis not present

## 2021-07-27 DIAGNOSIS — I5042 Chronic combined systolic (congestive) and diastolic (congestive) heart failure: Secondary | ICD-10-CM | POA: Diagnosis not present

## 2021-07-27 DIAGNOSIS — N210 Calculus in bladder: Secondary | ICD-10-CM | POA: Diagnosis not present

## 2021-07-27 DIAGNOSIS — M21372 Foot drop, left foot: Secondary | ICD-10-CM | POA: Diagnosis not present

## 2021-07-27 DIAGNOSIS — E782 Mixed hyperlipidemia: Secondary | ICD-10-CM | POA: Diagnosis not present

## 2021-07-27 DIAGNOSIS — I482 Chronic atrial fibrillation, unspecified: Secondary | ICD-10-CM | POA: Diagnosis not present

## 2021-08-03 DIAGNOSIS — Z8673 Personal history of transient ischemic attack (TIA), and cerebral infarction without residual deficits: Secondary | ICD-10-CM | POA: Diagnosis not present

## 2021-08-03 DIAGNOSIS — M21372 Foot drop, left foot: Secondary | ICD-10-CM | POA: Diagnosis not present

## 2021-08-03 DIAGNOSIS — Z7901 Long term (current) use of anticoagulants: Secondary | ICD-10-CM | POA: Diagnosis not present

## 2021-08-03 DIAGNOSIS — Z951 Presence of aortocoronary bypass graft: Secondary | ICD-10-CM | POA: Diagnosis not present

## 2021-08-03 DIAGNOSIS — I2510 Atherosclerotic heart disease of native coronary artery without angina pectoris: Secondary | ICD-10-CM | POA: Diagnosis not present

## 2021-08-03 DIAGNOSIS — I110 Hypertensive heart disease with heart failure: Secondary | ICD-10-CM | POA: Diagnosis not present

## 2021-08-03 DIAGNOSIS — K219 Gastro-esophageal reflux disease without esophagitis: Secondary | ICD-10-CM | POA: Diagnosis not present

## 2021-08-03 DIAGNOSIS — N210 Calculus in bladder: Secondary | ICD-10-CM | POA: Diagnosis not present

## 2021-08-03 DIAGNOSIS — E782 Mixed hyperlipidemia: Secondary | ICD-10-CM | POA: Diagnosis not present

## 2021-08-03 DIAGNOSIS — I482 Chronic atrial fibrillation, unspecified: Secondary | ICD-10-CM | POA: Diagnosis not present

## 2021-08-03 DIAGNOSIS — I5042 Chronic combined systolic (congestive) and diastolic (congestive) heart failure: Secondary | ICD-10-CM | POA: Diagnosis not present

## 2021-08-03 DIAGNOSIS — Z6820 Body mass index (BMI) 20.0-20.9, adult: Secondary | ICD-10-CM | POA: Diagnosis not present

## 2021-08-10 DIAGNOSIS — M21372 Foot drop, left foot: Secondary | ICD-10-CM | POA: Diagnosis not present

## 2021-08-10 DIAGNOSIS — Z8673 Personal history of transient ischemic attack (TIA), and cerebral infarction without residual deficits: Secondary | ICD-10-CM | POA: Diagnosis not present

## 2021-08-10 DIAGNOSIS — E782 Mixed hyperlipidemia: Secondary | ICD-10-CM | POA: Diagnosis not present

## 2021-08-10 DIAGNOSIS — K219 Gastro-esophageal reflux disease without esophagitis: Secondary | ICD-10-CM | POA: Diagnosis not present

## 2021-08-10 DIAGNOSIS — I482 Chronic atrial fibrillation, unspecified: Secondary | ICD-10-CM | POA: Diagnosis not present

## 2021-08-10 DIAGNOSIS — Z951 Presence of aortocoronary bypass graft: Secondary | ICD-10-CM | POA: Diagnosis not present

## 2021-08-10 DIAGNOSIS — I2510 Atherosclerotic heart disease of native coronary artery without angina pectoris: Secondary | ICD-10-CM | POA: Diagnosis not present

## 2021-08-10 DIAGNOSIS — N210 Calculus in bladder: Secondary | ICD-10-CM | POA: Diagnosis not present

## 2021-08-10 DIAGNOSIS — Z7901 Long term (current) use of anticoagulants: Secondary | ICD-10-CM | POA: Diagnosis not present

## 2021-08-10 DIAGNOSIS — Z6820 Body mass index (BMI) 20.0-20.9, adult: Secondary | ICD-10-CM | POA: Diagnosis not present

## 2021-08-10 DIAGNOSIS — I110 Hypertensive heart disease with heart failure: Secondary | ICD-10-CM | POA: Diagnosis not present

## 2021-08-10 DIAGNOSIS — I5042 Chronic combined systolic (congestive) and diastolic (congestive) heart failure: Secondary | ICD-10-CM | POA: Diagnosis not present

## 2021-08-22 DIAGNOSIS — I4891 Unspecified atrial fibrillation: Secondary | ICD-10-CM | POA: Diagnosis not present

## 2021-08-22 DIAGNOSIS — I10 Essential (primary) hypertension: Secondary | ICD-10-CM | POA: Diagnosis not present

## 2021-08-22 DIAGNOSIS — Z6821 Body mass index (BMI) 21.0-21.9, adult: Secondary | ICD-10-CM | POA: Diagnosis not present

## 2021-08-22 DIAGNOSIS — J208 Acute bronchitis due to other specified organisms: Secondary | ICD-10-CM | POA: Diagnosis not present

## 2021-09-02 DIAGNOSIS — I2510 Atherosclerotic heart disease of native coronary artery without angina pectoris: Secondary | ICD-10-CM | POA: Diagnosis not present

## 2021-09-02 DIAGNOSIS — E782 Mixed hyperlipidemia: Secondary | ICD-10-CM | POA: Diagnosis not present

## 2021-09-02 DIAGNOSIS — Z951 Presence of aortocoronary bypass graft: Secondary | ICD-10-CM | POA: Diagnosis not present

## 2021-09-02 DIAGNOSIS — Z7901 Long term (current) use of anticoagulants: Secondary | ICD-10-CM | POA: Diagnosis not present

## 2021-09-02 DIAGNOSIS — M21372 Foot drop, left foot: Secondary | ICD-10-CM | POA: Diagnosis not present

## 2021-09-02 DIAGNOSIS — I110 Hypertensive heart disease with heart failure: Secondary | ICD-10-CM | POA: Diagnosis not present

## 2021-09-02 DIAGNOSIS — K219 Gastro-esophageal reflux disease without esophagitis: Secondary | ICD-10-CM | POA: Diagnosis not present

## 2021-09-02 DIAGNOSIS — Z6820 Body mass index (BMI) 20.0-20.9, adult: Secondary | ICD-10-CM | POA: Diagnosis not present

## 2021-09-02 DIAGNOSIS — N210 Calculus in bladder: Secondary | ICD-10-CM | POA: Diagnosis not present

## 2021-09-02 DIAGNOSIS — Z8673 Personal history of transient ischemic attack (TIA), and cerebral infarction without residual deficits: Secondary | ICD-10-CM | POA: Diagnosis not present

## 2021-09-02 DIAGNOSIS — I482 Chronic atrial fibrillation, unspecified: Secondary | ICD-10-CM | POA: Diagnosis not present

## 2021-09-02 DIAGNOSIS — I5042 Chronic combined systolic (congestive) and diastolic (congestive) heart failure: Secondary | ICD-10-CM | POA: Diagnosis not present

## 2021-09-08 DIAGNOSIS — I2510 Atherosclerotic heart disease of native coronary artery without angina pectoris: Secondary | ICD-10-CM | POA: Diagnosis not present

## 2021-09-08 DIAGNOSIS — Z6820 Body mass index (BMI) 20.0-20.9, adult: Secondary | ICD-10-CM | POA: Diagnosis not present

## 2021-09-08 DIAGNOSIS — N210 Calculus in bladder: Secondary | ICD-10-CM | POA: Diagnosis not present

## 2021-09-08 DIAGNOSIS — Z951 Presence of aortocoronary bypass graft: Secondary | ICD-10-CM | POA: Diagnosis not present

## 2021-09-08 DIAGNOSIS — E782 Mixed hyperlipidemia: Secondary | ICD-10-CM | POA: Diagnosis not present

## 2021-09-08 DIAGNOSIS — K219 Gastro-esophageal reflux disease without esophagitis: Secondary | ICD-10-CM | POA: Diagnosis not present

## 2021-09-08 DIAGNOSIS — M21372 Foot drop, left foot: Secondary | ICD-10-CM | POA: Diagnosis not present

## 2021-09-08 DIAGNOSIS — I482 Chronic atrial fibrillation, unspecified: Secondary | ICD-10-CM | POA: Diagnosis not present

## 2021-09-08 DIAGNOSIS — Z8673 Personal history of transient ischemic attack (TIA), and cerebral infarction without residual deficits: Secondary | ICD-10-CM | POA: Diagnosis not present

## 2021-09-08 DIAGNOSIS — I110 Hypertensive heart disease with heart failure: Secondary | ICD-10-CM | POA: Diagnosis not present

## 2021-09-08 DIAGNOSIS — I5042 Chronic combined systolic (congestive) and diastolic (congestive) heart failure: Secondary | ICD-10-CM | POA: Diagnosis not present

## 2021-09-08 DIAGNOSIS — Z7901 Long term (current) use of anticoagulants: Secondary | ICD-10-CM | POA: Diagnosis not present

## 2021-09-14 DIAGNOSIS — Z8673 Personal history of transient ischemic attack (TIA), and cerebral infarction without residual deficits: Secondary | ICD-10-CM | POA: Diagnosis not present

## 2021-09-14 DIAGNOSIS — I5042 Chronic combined systolic (congestive) and diastolic (congestive) heart failure: Secondary | ICD-10-CM | POA: Diagnosis not present

## 2021-09-14 DIAGNOSIS — I110 Hypertensive heart disease with heart failure: Secondary | ICD-10-CM | POA: Diagnosis not present

## 2021-09-14 DIAGNOSIS — I482 Chronic atrial fibrillation, unspecified: Secondary | ICD-10-CM | POA: Diagnosis not present

## 2021-09-14 DIAGNOSIS — Z6820 Body mass index (BMI) 20.0-20.9, adult: Secondary | ICD-10-CM | POA: Diagnosis not present

## 2021-09-14 DIAGNOSIS — I2510 Atherosclerotic heart disease of native coronary artery without angina pectoris: Secondary | ICD-10-CM | POA: Diagnosis not present

## 2021-09-14 DIAGNOSIS — K219 Gastro-esophageal reflux disease without esophagitis: Secondary | ICD-10-CM | POA: Diagnosis not present

## 2021-09-14 DIAGNOSIS — N210 Calculus in bladder: Secondary | ICD-10-CM | POA: Diagnosis not present

## 2021-09-14 DIAGNOSIS — Z951 Presence of aortocoronary bypass graft: Secondary | ICD-10-CM | POA: Diagnosis not present

## 2021-09-14 DIAGNOSIS — E782 Mixed hyperlipidemia: Secondary | ICD-10-CM | POA: Diagnosis not present

## 2021-09-14 DIAGNOSIS — M21372 Foot drop, left foot: Secondary | ICD-10-CM | POA: Diagnosis not present

## 2021-09-14 DIAGNOSIS — Z7901 Long term (current) use of anticoagulants: Secondary | ICD-10-CM | POA: Diagnosis not present

## 2021-09-28 DIAGNOSIS — Z0000 Encounter for general adult medical examination without abnormal findings: Secondary | ICD-10-CM | POA: Diagnosis not present

## 2021-09-28 DIAGNOSIS — Z1331 Encounter for screening for depression: Secondary | ICD-10-CM | POA: Diagnosis not present

## 2021-09-28 DIAGNOSIS — E785 Hyperlipidemia, unspecified: Secondary | ICD-10-CM | POA: Diagnosis not present

## 2021-09-28 DIAGNOSIS — Z9181 History of falling: Secondary | ICD-10-CM | POA: Diagnosis not present

## 2021-10-06 ENCOUNTER — Emergency Department (HOSPITAL_COMMUNITY)
Admission: EM | Admit: 2021-10-06 | Discharge: 2021-10-07 | Disposition: A | Discharge status: Home / Self Care | Payer: Medicare Other | Attending: Emergency Medicine | Admitting: Emergency Medicine

## 2021-10-06 ENCOUNTER — Encounter (HOSPITAL_COMMUNITY): Payer: Self-pay | Admitting: Emergency Medicine

## 2021-10-06 ENCOUNTER — Other Ambulatory Visit: Payer: Self-pay

## 2021-10-06 ENCOUNTER — Emergency Department (HOSPITAL_COMMUNITY): Payer: Medicare Other

## 2021-10-06 DIAGNOSIS — R600 Localized edema: Secondary | ICD-10-CM | POA: Diagnosis not present

## 2021-10-06 DIAGNOSIS — Z951 Presence of aortocoronary bypass graft: Secondary | ICD-10-CM | POA: Diagnosis not present

## 2021-10-06 DIAGNOSIS — R000 Tachycardia, unspecified: Secondary | ICD-10-CM | POA: Insufficient documentation

## 2021-10-06 DIAGNOSIS — Z7901 Long term (current) use of anticoagulants: Secondary | ICD-10-CM | POA: Diagnosis not present

## 2021-10-06 DIAGNOSIS — Z79899 Other long term (current) drug therapy: Secondary | ICD-10-CM | POA: Insufficient documentation

## 2021-10-06 DIAGNOSIS — I2510 Atherosclerotic heart disease of native coronary artery without angina pectoris: Secondary | ICD-10-CM | POA: Diagnosis not present

## 2021-10-06 DIAGNOSIS — I10 Essential (primary) hypertension: Secondary | ICD-10-CM | POA: Insufficient documentation

## 2021-10-06 DIAGNOSIS — K5900 Constipation, unspecified: Secondary | ICD-10-CM | POA: Diagnosis not present

## 2021-10-06 DIAGNOSIS — R112 Nausea with vomiting, unspecified: Secondary | ICD-10-CM | POA: Insufficient documentation

## 2021-10-06 DIAGNOSIS — R531 Weakness: Secondary | ICD-10-CM | POA: Insufficient documentation

## 2021-10-06 DIAGNOSIS — J9811 Atelectasis: Secondary | ICD-10-CM | POA: Diagnosis not present

## 2021-10-06 DIAGNOSIS — E860 Dehydration: Secondary | ICD-10-CM | POA: Diagnosis not present

## 2021-10-06 DIAGNOSIS — R1110 Vomiting, unspecified: Secondary | ICD-10-CM | POA: Diagnosis not present

## 2021-10-06 LAB — CBC
HCT: 42.1 % (ref 39.0–52.0)
Hemoglobin: 14 g/dL (ref 13.0–17.0)
MCH: 32 pg (ref 26.0–34.0)
MCHC: 33.3 g/dL (ref 30.0–36.0)
MCV: 96.1 fL (ref 80.0–100.0)
Platelets: 191 10*3/uL (ref 150–400)
RBC: 4.38 MIL/uL (ref 4.22–5.81)
RDW: 14.6 % (ref 11.5–15.5)
WBC: 6.7 10*3/uL (ref 4.0–10.5)
nRBC: 0 % (ref 0.0–0.2)

## 2021-10-06 LAB — COMPREHENSIVE METABOLIC PANEL
ALT: 26 U/L (ref 0–44)
AST: 33 U/L (ref 15–41)
Albumin: 3.5 g/dL (ref 3.5–5.0)
Alkaline Phosphatase: 82 U/L (ref 38–126)
Anion gap: 8 (ref 5–15)
BUN: 24 mg/dL — ABNORMAL HIGH (ref 8–23)
CO2: 26 mmol/L (ref 22–32)
Calcium: 9.2 mg/dL (ref 8.9–10.3)
Chloride: 105 mmol/L (ref 98–111)
Creatinine, Ser: 0.85 mg/dL (ref 0.61–1.24)
GFR, Estimated: >60 mL/min (ref >60)
Glucose, Bld: 146 mg/dL — ABNORMAL HIGH (ref 70–99)
Potassium: 4.2 mmol/L (ref 3.5–5.1)
Sodium: 139 mmol/L (ref 135–145)
Total Bilirubin: 0.8 mg/dL (ref 0.3–1.2)
Total Protein: 7 g/dL (ref 6.5–8.1)

## 2021-10-06 LAB — BRAIN NATRIURETIC PEPTIDE: B Natriuretic Peptide: 591.9 pg/mL — ABNORMAL HIGH (ref 0.0–100.0)

## 2021-10-06 LAB — LIPASE, BLOOD: Lipase: 23 U/L (ref 11–51)

## 2021-10-06 LAB — TROPONIN I (HIGH SENSITIVITY)
Troponin I (High Sensitivity): 15 ng/L (ref <18)
Troponin I (High Sensitivity): 14 ng/L (ref <18)

## 2021-10-06 MED ORDER — LACTATED RINGERS IV BOLUS
500.0000 mL | Freq: Once | INTRAVENOUS | Status: AC
  Administered 2021-10-06: 500 mL via INTRAVENOUS

## 2021-10-06 MED ORDER — ONDANSETRON HCL 4 MG/2ML IJ SOLN
4.0000 mg | Freq: Once | INTRAMUSCULAR | Status: AC
  Administered 2021-10-06: 4 mg via INTRAVENOUS
  Filled 2021-10-06: qty 2

## 2021-10-06 NOTE — ED Triage Notes (Signed)
Pt presents with n/v since yesterday.  Weakness.  No pain anywhere. No abdominal pain. No diarrhea.  Just not able to tolerate po and feeling weak.  ?

## 2021-10-06 NOTE — ED Provider Notes (Signed)
?[facility] ?Provider Note ? ? ?CSN: [id number] ?Arrival date & time: 10/06/21  1532 ? ?  ? ?History ? ?Chief Complaint  ?Patient presents with  ? Emesis  ? Weakness  ? ? ?Edgar Thomas is a 88 y.o. male. ? ?Pt is an 88y/o male with hx of chronic AF on eliquis, CAD s/p CABG, Stroke, GERD, HTN, HLD who is presenting today with his wife for recurrent vomiting since last night and weakness.  Pt was fine throughout the day and then last night started having vomiting.  He had multiple episodes before going to bed but was able to sleep through the night however when he woke up he started vomiting again.  It had stopped and wife attempted to take him to his scheduled MD appointment however when they got to the doctor's office he was unable to stand due to weakness and she brought him here.  He had had no fever, chest pain, SOB, or diarrhea.  Last normal BM was yesterday.  Pt states he was having some abd earlier but denies it now.  No prior abd surgeries.  Pt has had chronic swelling in the lower legs for the last year but wife reports no different.  Also no know sick contacts or recent abx.  Wife reports since his stroke he had had no energy and basically sleeps most of the day.  However that has not been a recent change.  ? ?The history is provided by the patient and the spouse.  ?Emesis ?Weakness ?Associated symptoms: vomiting   ? ?  ? ?Home Medications ?Prior to Admission medications   ?Medication Sig Start Date End Date Taking? Authorizing Provider  ?ondansetron (ZOFRAN) 4 MG tablet Take 1 tablet (4 mg total) by mouth every 6 (six) hours. 10/07/21  Yes Blanchie Dessert, MD  ?apixaban (ELIQUIS) 5 MG TABS tablet Take 5 mg by mouth 2 (two) times daily.    [provider]  ?benzonatate (TESSALON PERLES) 100 MG capsule Take 1 capsule (100 mg total) by mouth 3 (three) times daily. 05/23/21 05/23/22  Pahwani, Michell Heinrich, MD  ?Cholecalciferol (VITAMIN D-3) 25 MCG (1000 UT) CAPS Take  1,000 Units by mouth daily after breakfast.    [provider]  ?CRESTOR 20 MG tablet Take 20 mg by mouth every morning.  08/26/14   [provider]  ?diltiazem (CARDIZEM CD) 120 MG 24 hr capsule Take 120 mg by mouth every morning. 05/10/21   [provider]  ?dofetilide (TIKOSYN) 125 MCG capsule Take 125 mcg by mouth 2 (two) times daily. 05/20/18   [provider]  ?ferrous sulfate 325 (65 FE) MG tablet Take 325 mg by mouth daily with breakfast.    [provider]  ?furosemide (LASIX) 40 MG tablet Take 20 mg by mouth daily. 05/10/21   [provider]  ?guaiFENesin (MUCINEX) 600 MG 12 hr tablet Take 2 tablets (1,200 mg total) by mouth 2 (two) times daily. ?Patient not taking: Reported on 05/18/2021 08/30/15   Rai, Vernelle Emerald, MD  ?JARDIANCE 10 MG TABS tablet Take 10 mg by mouth every morning. 05/10/21   [provider]  ?metoprolol tartrate (LOPRESSOR) 25 MG tablet Take 1 & 1/2 tablets (37.5 mg total) by mouth 2 (two) times daily. 05/19/21 06/18/21  Damita Lack, MD  ?Multiple Vitamins-Minerals (ONE-A-DAY MENS 50+ ADVANTAGE) TABS Take 1 tablet by mouth daily with breakfast.     [provider]  ?multivitamin-lutein (OCUVITE-LUTEIN) CAPS capsule Take 1 capsule by mouth  daily with breakfast.     [provider]  ?nitroGLYCERIN (NITROSTAT) 0.4 MG SL tablet Place 0.4 mg under the tongue every 5 (five) minutes as needed for chest pain.    [provider]  ?polyethylene glycol (MIRALAX / GLYCOLAX) packet Take 17 g by mouth daily as needed for moderate constipation. ?Patient not taking: Reported on 05/18/2021 08/30/15   Rai, Vernelle Emerald, MD  ?potassium chloride (KLOR-CON M) 10 MEQ tablet Take 1 tablet (10 mEq total) by mouth daily. 05/23/21 06/22/21  Pahwani, Michell Heinrich, MD  ?   ? ?Allergies    ?Contrast media [iodinated contrast media], Metrizamide, Other, Amiodarone, Ciprofloxacin, and Tramadol   ? ?Review of Systems   ?Review of Systems   ?Gastrointestinal:  Positive for vomiting.  ?Neurological:  Positive for weakness.  ? ?Physical Exam ?Updated Vital Signs ?BP (!) 139/109   Pulse (!) 103   Temp 97.6 ?F (36.4 ?C) (Oral)   Resp (!) 25   SpO2 99%  ?Physical Exam ?Vitals and nursing note reviewed.  ?Constitutional:   ?   General: He is not in acute distress. ?   Appearance: He is well-developed.  ?HENT:  ?   Head: Normocephalic and atraumatic.  ?   Mouth/Throat:  ?   Mouth: Mucous membranes are dry.  ?Eyes:  ?   Conjunctiva/sclera: Conjunctivae normal.  ?   Pupils: Pupils are equal, round, and reactive to light.  ?Cardiovascular:  ?   Rate and Rhythm: Tachycardia present. Rhythm irregularly irregular.  ?   Heart sounds: No murmur heard. ?Pulmonary:  ?   Effort: Pulmonary effort is normal. No respiratory distress.  ?   Breath sounds: Normal breath sounds. No wheezing or rales.  ?Abdominal:  ?   General: Bowel sounds are normal. There is no distension.  ?   Palpations: Abdomen is soft.  ?   Tenderness: There is no abdominal tenderness. There is no guarding or rebound.  ?Musculoskeletal:     ?   General: No tenderness. Normal range of motion.  ?   Cervical back: Normal range of motion and neck supple.  ?   Right lower leg: Edema present.  ?   Left lower leg: Edema present.  ?   Comments: 2+ pitting edema in bilateral ankles  ?Skin: ?   General: Skin is warm and dry.  ?   Findings: No erythema or rash.  ?Neurological:  ?   Mental Status: He is alert and oriented to person, place, and time. Mental status is at baseline.  ?Psychiatric:     ?   Mood and Affect: Mood normal.     ?   Behavior: Behavior normal.  ? ? ?ED Results / Procedures / Treatments   ?Labs ?(all labs ordered are listed, but only abnormal results are displayed) ?Labs Reviewed  ?COMPREHENSIVE METABOLIC PANEL - Abnormal; Notable for the following components:  ?    Result Value  ? Glucose, Bld 146 (*)   ? BUN 24 (*)   ? All other components within normal limits  ?BRAIN NATRIURETIC PEPTIDE  - Abnormal; Notable for the following components:  ? B Natriuretic Peptide 591.9 (*)   ? All other components within normal limits  ?LIPASE, BLOOD  ?CBC  ?URINALYSIS, ROUTINE W REFLEX MICROSCOPIC  ?TROPONIN I (HIGH SENSITIVITY)  ?TROPONIN I (HIGH SENSITIVITY)  ? ? ?EKG ?None ? ?Radiology ?DG Chest 2 View ? ?Result Date: 10/06/2021 ?CLINICAL DATA:  Weakness.  Nausea and vomiting since yesterday. EXAM: CHEST - 2 VIEW  COMPARISON:  05/17/2021, CT 05/21/2021 FINDINGS: Post median sternotomy with stable cardiomegaly. Unchanged mediastinal contours. Aortic atherosclerosis. Mild chronic central bronchial thickening. Subsegmental opacities in the lung basis. No pleural effusion or pneumothorax. Upper abdominal splenic artery calcifications. Stable midthoracic compression deformity. IMPRESSION: Subsegmental bibasilar opacities typically atelectasis. Chronic bronchial thickening. Stable cardiomegaly. Electronically Signed   By: Keith Rake M.D.   On: 10/06/2021 18:02  ? ?DG Abdomen 1 View ? ?Result Date: 10/06/2021 ?CLINICAL DATA:  Vomiting.  Rule out obstruction.  Weakness. EXAM: ABDOMEN - 1 VIEW COMPARISON:  CT 05/02/2021 FINDINGS: No small bowel dilatation or evidence of obstruction. There is a large volume of stool throughout the colon stool distends the rectum up to 7.3 cm. An 18 mm calcification in the central pelvis corresponds to bladder stone on prior CT. Additional pelvic calcifications are phleboliths. Vascular calcifications, including aortic atherosclerosis. Cardiomegaly. IMPRESSION: 1. Large volume of stool throughout the colon with stool distending the rectum. No evidence of obstruction. 2. Bladder stone. Electronically Signed   By: Keith Rake M.D.   On: 10/06/2021 21:58   ? ?Procedures ?Procedures  ? ? ?Medications Ordered in ED ?Medications  ?lactated ringers bolus 500 mL (0 mLs Intravenous Stopped 10/06/21 2356)  ?ondansetron [facility]) injection 4 mg (4 mg Intravenous Given 10/06/21 2139)  ? ? ?ED  Course/ Medical Decision Making/ A&P ?  ?                        ?Medical Decision Making ?Amount and/or Complexity of Data Reviewed ?External Data Reviewed: notes. ?Labs: ordered. Decision-making details doc

## 2021-10-06 NOTE — ED Provider Triage Note (Signed)
Emergency Medicine Provider Triage Evaluation Note ? ?Santonio Speakman , a 88 y.o. male  was evaluated in triage.  Patient presents with wife for complaint of weakness, nausea and vomiting.  Per wife patient has been primarily been wheelchair-bound and requires significant assistance since his stroke.  Today he was unable to bear weight when he got out of the car for his PCP appointment.  Denies chest pain, shortness of breath, abdominal pain, dysuria or other complaints.  Wife denies any mental status change. ? ?Review of Systems  ?Positive: As above ?Negative: As above ? ?Physical Exam  ?BP (!) 126/100 (BP Location: Left Arm)   Pulse (!) 50   Temp 97.6 ?F (36.4 ?C) (Oral)   Resp 16   SpO2 98%  ?Gen:   Awake, no distress   ?Resp:  Normal effort  ?MSK:   Moves extremities without difficulty  ?Other:   ? ?Medical Decision Making  ?Medically screening exam initiated at 4:15 PM.  Appropriate orders placed.  Adair Upadhyay was informed that the remainder of the evaluation will be completed by another provider, this initial triage assessment does not replace that evaluation, and the importance of remaining in the ED until their evaluation is complete. ? ? ?  ?Marita Kansas, PA-C ?10/06/21 1617 ? ?

## 2021-10-07 LAB — URINALYSIS, ROUTINE W REFLEX MICROSCOPIC
Bilirubin Urine: NEGATIVE
Glucose, UA: >=500 mg/dL — AB
Hgb urine dipstick: NEGATIVE
Ketones, ur: NEGATIVE mg/dL
Nitrite: NEGATIVE
Protein, ur: NEGATIVE mg/dL
Specific Gravity, Urine: 1.025 (ref 1.005–1.030)
WBC, UA: >50 WBC/hpf — ABNORMAL HIGH (ref 0–5)
pH: 5 (ref 5.0–8.0)

## 2021-10-07 MED ORDER — ONDANSETRON HCL 4 MG PO TABS: 4.0000 mg | ORAL_TABLET | Freq: Four times a day (QID) | ORAL | 0 refills | Status: DC

## 2021-10-07 NOTE — Discharge Instructions (Signed)
The lab work today looks good and x-ray does show a significant amount of stool.  That could be part of the reason why he was having vomiting.  It would be a good idea to try either Colace, Dulcolax or MiraLAX to flush the colon and get the stool out.  Then using prune juice or other means to keep bowel movements regular.  It is most likely related to his immobility.  Continue current medications.  However if he starts having recurrent vomiting, severe abdominal pain, confusion, fever, shortness of breath please return to the emergency room.  Call his doctor tomorrow so they can schedule a follow-up visit soon within the week. ?

## 2021-10-08 ENCOUNTER — Telehealth (HOSPITAL_COMMUNITY): Payer: Self-pay | Admitting: Emergency Medicine

## 2021-10-08 MED ORDER — CEFDINIR 300 MG PO CAPS: 300.0000 mg | ORAL_CAPSULE | Freq: Two times a day (BID) | ORAL | 0 refills | Status: DC

## 2021-10-08 NOTE — Telephone Encounter (Signed)
Pt left last night before urine returned.  UA with signs concerning for UTI and pt sent in a prescriptions for antibiotic. ?

## 2021-11-17 DIAGNOSIS — D649 Anemia, unspecified: Secondary | ICD-10-CM | POA: Diagnosis not present

## 2021-11-17 DIAGNOSIS — I10 Essential (primary) hypertension: Secondary | ICD-10-CM | POA: Diagnosis not present

## 2021-11-17 DIAGNOSIS — B0229 Other postherpetic nervous system involvement: Secondary | ICD-10-CM | POA: Diagnosis not present

## 2021-11-17 DIAGNOSIS — R600 Localized edema: Secondary | ICD-10-CM | POA: Diagnosis not present

## 2021-11-17 DIAGNOSIS — E782 Mixed hyperlipidemia: Secondary | ICD-10-CM | POA: Diagnosis not present

## 2021-11-17 DIAGNOSIS — Z8673 Personal history of transient ischemic attack (TIA), and cerebral infarction without residual deficits: Secondary | ICD-10-CM | POA: Diagnosis not present

## 2021-11-17 DIAGNOSIS — I4891 Unspecified atrial fibrillation: Secondary | ICD-10-CM | POA: Diagnosis not present

## 2021-12-01 DIAGNOSIS — K5900 Constipation, unspecified: Secondary | ICD-10-CM | POA: Diagnosis not present

## 2021-12-01 DIAGNOSIS — R1310 Dysphagia, unspecified: Secondary | ICD-10-CM | POA: Diagnosis not present

## 2021-12-01 DIAGNOSIS — K6289 Other specified diseases of anus and rectum: Secondary | ICD-10-CM | POA: Diagnosis not present

## 2021-12-01 DIAGNOSIS — K649 Unspecified hemorrhoids: Secondary | ICD-10-CM | POA: Diagnosis not present

## 2021-12-08 DIAGNOSIS — R1310 Dysphagia, unspecified: Secondary | ICD-10-CM | POA: Diagnosis not present

## 2021-12-08 DIAGNOSIS — K649 Unspecified hemorrhoids: Secondary | ICD-10-CM | POA: Diagnosis not present

## 2021-12-08 DIAGNOSIS — K6289 Other specified diseases of anus and rectum: Secondary | ICD-10-CM | POA: Diagnosis not present

## 2021-12-08 DIAGNOSIS — K5900 Constipation, unspecified: Secondary | ICD-10-CM | POA: Diagnosis not present

## 2021-12-15 DIAGNOSIS — E039 Hypothyroidism, unspecified: Secondary | ICD-10-CM | POA: Diagnosis not present

## 2021-12-15 DIAGNOSIS — E291 Testicular hypofunction: Secondary | ICD-10-CM | POA: Diagnosis not present

## 2021-12-15 DIAGNOSIS — E538 Deficiency of other specified B group vitamins: Secondary | ICD-10-CM | POA: Diagnosis not present

## 2021-12-15 DIAGNOSIS — I2510 Atherosclerotic heart disease of native coronary artery without angina pectoris: Secondary | ICD-10-CM | POA: Diagnosis not present

## 2021-12-22 DIAGNOSIS — B0229 Other postherpetic nervous system involvement: Secondary | ICD-10-CM | POA: Diagnosis not present

## 2021-12-22 DIAGNOSIS — I4891 Unspecified atrial fibrillation: Secondary | ICD-10-CM | POA: Diagnosis not present

## 2021-12-22 DIAGNOSIS — I10 Essential (primary) hypertension: Secondary | ICD-10-CM | POA: Diagnosis not present

## 2021-12-22 DIAGNOSIS — R600 Localized edema: Secondary | ICD-10-CM | POA: Diagnosis not present

## 2022-01-05 DIAGNOSIS — M159 Polyosteoarthritis, unspecified: Secondary | ICD-10-CM | POA: Diagnosis not present

## 2022-01-05 DIAGNOSIS — D649 Anemia, unspecified: Secondary | ICD-10-CM | POA: Diagnosis not present

## 2022-01-05 DIAGNOSIS — E782 Mixed hyperlipidemia: Secondary | ICD-10-CM | POA: Diagnosis not present

## 2022-01-05 DIAGNOSIS — E559 Vitamin D deficiency, unspecified: Secondary | ICD-10-CM | POA: Diagnosis not present

## 2022-01-19 DIAGNOSIS — D509 Iron deficiency anemia, unspecified: Secondary | ICD-10-CM | POA: Diagnosis not present

## 2022-01-19 DIAGNOSIS — E039 Hypothyroidism, unspecified: Secondary | ICD-10-CM | POA: Diagnosis not present

## 2022-01-19 DIAGNOSIS — E291 Testicular hypofunction: Secondary | ICD-10-CM | POA: Diagnosis not present

## 2022-01-19 DIAGNOSIS — L309 Dermatitis, unspecified: Secondary | ICD-10-CM | POA: Diagnosis not present

## 2022-01-19 DIAGNOSIS — I10 Essential (primary) hypertension: Secondary | ICD-10-CM | POA: Diagnosis not present

## 2022-01-19 DIAGNOSIS — R5382 Chronic fatigue, unspecified: Secondary | ICD-10-CM | POA: Diagnosis not present

## 2022-02-02 DIAGNOSIS — E291 Testicular hypofunction: Secondary | ICD-10-CM | POA: Diagnosis not present

## 2022-02-02 DIAGNOSIS — R229 Localized swelling, mass and lump, unspecified: Secondary | ICD-10-CM | POA: Diagnosis not present

## 2022-02-02 DIAGNOSIS — E538 Deficiency of other specified B group vitamins: Secondary | ICD-10-CM | POA: Diagnosis not present

## 2022-02-02 DIAGNOSIS — E039 Hypothyroidism, unspecified: Secondary | ICD-10-CM | POA: Diagnosis not present

## 2022-02-17 DIAGNOSIS — I10 Essential (primary) hypertension: Secondary | ICD-10-CM | POA: Diagnosis not present

## 2022-02-17 DIAGNOSIS — E039 Hypothyroidism, unspecified: Secondary | ICD-10-CM | POA: Diagnosis not present

## 2022-02-17 DIAGNOSIS — E291 Testicular hypofunction: Secondary | ICD-10-CM | POA: Diagnosis not present

## 2022-02-17 DIAGNOSIS — I2510 Atherosclerotic heart disease of native coronary artery without angina pectoris: Secondary | ICD-10-CM | POA: Diagnosis not present

## 2022-02-17 DIAGNOSIS — E538 Deficiency of other specified B group vitamins: Secondary | ICD-10-CM | POA: Diagnosis not present

## 2022-02-28 DIAGNOSIS — E039 Hypothyroidism, unspecified: Secondary | ICD-10-CM | POA: Diagnosis not present

## 2022-02-28 DIAGNOSIS — E538 Deficiency of other specified B group vitamins: Secondary | ICD-10-CM | POA: Diagnosis not present

## 2022-02-28 DIAGNOSIS — I509 Heart failure, unspecified: Secondary | ICD-10-CM | POA: Diagnosis not present

## 2022-02-28 DIAGNOSIS — I2510 Atherosclerotic heart disease of native coronary artery without angina pectoris: Secondary | ICD-10-CM | POA: Diagnosis not present

## 2022-03-06 NOTE — Progress Notes (Signed)
CARDIOLOGY CONSULT NOTE       Patient ID: Edgar Thomas MRN: [id number] DOB/AGE: February 13, 1930 88 y.o.  Admit date: (Not on file) Referring Physician: Truman Thomas Primary Physician: Edgar Nakai, MD Primary Cardiologist: Edgar Thomas: CAD/PAF/CHF  Active Problems:   * No active hospital problems. *   HPI:  88 y.o. DNR referred by DR Edgar Thomas for CAD/CABG/PAF/ and CHF Last seen by me in 2018 History of CABG in 2015 LIMA to LAD, Sequentiaql SVG to OM1/2, and SVG to PDA. Last echo in 2019 at [place] showed EF 35-40% with moderate MR Admitted [id number] with metabolic encephalopathy flu positive and right femoral vein DVT Prior history of stroke was on eliquis and oncology did not recommend changing DOAC has been on Tikosyn for afib but appears chronic No CHF exacerbations or chest pains On beta blocker and lasix but ARB appears to have been stopped BUN 24 Cr 0.85 Note allergy to amiodarone   He is actually the father of one of my patients Edgar Thomas who has had MVR. His wife Edgar Thomas is about 36 years younger than him He has 5 kids and she had one before they married She seems to be very attentive and cares for him well  IN office he moves slowly He has pursed lipped breathing and we are unable to get a good sat reading on him. He is in rapid afib rates 120 and he has gross volume overload with edema to mid thigh and component of lymph edema   Discussed need for hospitalization with them and they are willing to be admitted for diuresis and further w/u He confirms DNR status  Call[facility] admitting and he will go to ER after 5:00 pm for direct admission to telemetry floor   ROS All other systems reviewed and negative except as noted above  Past Medical History:  Diagnosis Date   A-fib ([place])    Anemia    CAD (coronary artery disease)    Constipation    GERD (gastroesophageal reflux disease)    Heart attack ([place])    Heartburn    Hyperlipemia    Hypertension    Post herpetic neuralgia    Stroke  (cerebrum) ([place]) 11/04/2020   Left lenticulostriate and external capsule   Stroke ([place])    TIA (transient ischemic attack)    Vitamin D deficiency     Family History  Problem Relation Age of Onset   Heart disease Mother        died at age 58 of heart attack   Leukemia Sister    Heart disease Brother    Hypertension Brother     Social History   Socioeconomic History   Marital status: Married    Spouse name: Edgar Thomas   Number of children: 6   Years of education: Not on file   Highest education level: Not on file  Occupational History   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  Tobacco Use   Smoking status: Never   Smokeless tobacco: Never  Substance and Sexual Activity   Alcohol use: No   Drug use: No   Sexual activity: Not on file  Other Topics Concern   Not on file  Social History Narrative   Lives with wife   Right Handed   Drinks no caffeine   Social Determinants of Health   Financial Resource Strain: Not on file  Food Insecurity: Not on file  Transportation Needs: Not on file  Physical Activity: Not on file  Stress: Not on file  Social Connections:  Not on file  Intimate Partner Violence: Not on file    Past Surgical History:  Procedure Laterality Date   balloon angioplasty of LAD     CARDIOVERSION  2018   a fib   CATARACT EXTRACTION, BILATERAL  1019, 2022   COLONOSCOPY     CORONARY ARTERY BYPASS GRAFT N/A 12/03/2013   Procedure: CORONARY ARTERY BYPASS GRAFTING (CABG) x4 using left internal mammary artery and right greater saphenous vein. LIMA to LAD, sequential SVG to OM 1 & OM 2, SVG to PD;  Surgeon: Grace Isaac, MD;  Location: [facility];  Service: Open Heart Surgery;  Laterality: N/A;   ENDOVEIN HARVEST OF GREATER SAPHENOUS VEIN Right 12/03/2013   Procedure: ENDOVEIN HARVEST OF GREATER SAPHENOUS VEIN;  Surgeon: Grace Isaac, MD;  Location: [facility];  Service: Open Heart Surgery;  Laterality: Right;   INTRAOPERATIVE TRANSESOPHAGEAL ECHOCARDIOGRAM N/A 12/03/2013   Procedure:  INTRAOPERATIVE TRANSESOPHAGEAL ECHOCARDIOGRAM;  Surgeon: Grace Isaac, MD;  Location: [facility];  Service: Open Heart Surgery;  Laterality: N/A;      Current Outpatient Medications:    apixaban (ELIQUIS) 5 MG TABS tablet, Take 5 mg by mouth 2 (two) times daily., Disp: , Rfl:    Cholecalciferol (VITAMIN D-3) 25 MCG (1000 UT) CAPS, Take by mouth daily after breakfast. Patient taking 5,000 units, Disp: , Rfl:    CRESTOR 20 MG tablet, Take 20 mg by mouth every morning. , Disp: , Rfl: 3   diltiazem (CARDIZEM CD) 120 MG 24 hr capsule, Take 120 mg by mouth every morning., Disp: , Rfl:    dofetilide (TIKOSYN) 125 MCG capsule, Take 125 mcg by mouth 2 (two) times daily., Disp: , Rfl:    ferrous sulfate 325 (65 FE) MG tablet, Take 325 mg by mouth daily with breakfast., Disp: , Rfl:    furosemide (LASIX) 40 MG tablet, Take 20 mg by mouth daily., Disp: , Rfl:    guaiFENesin (MUCINEX) 600 MG 12 hr tablet, Take 2 tablets (1,200 mg total) by mouth 2 (two) times daily., Disp: 60 tablet, Rfl: 0   JARDIANCE 10 MG TABS tablet, Take 10 mg by mouth every morning., Disp: , Rfl:    Multiple Vitamins-Minerals (ONE-A-DAY MENS 50+ ADVANTAGE) TABS, Take 1 tablet by mouth daily with breakfast. , Disp: , Rfl:    nitroGLYCERIN (NITROSTAT) 0.4 MG SL tablet, Place 0.4 mg under the tongue every 5 (five) minutes as needed for chest pain., Disp: , Rfl:    polyethylene glycol (MIRALAX / GLYCOLAX) packet, Take 17 g by mouth daily as needed for moderate constipation., Disp: 30 each, Rfl: 0   Testosterone Cypionate 200 MG/ML SOLN, Inject as directed. Taking every 4 weeks, Disp: , Rfl:    benzonatate (TESSALON PERLES) 100 MG capsule, Take 1 capsule (100 mg total) by mouth 3 (three) times daily. (Patient not taking: Reported on 03/08/2022), Disp: 30 capsule, Rfl: 0   cefdinir (OMNICEF) 300 MG capsule, Take 1 capsule (300 mg total) by mouth 2 (two) times daily. (Patient not taking: Reported on 03/08/2022), Disp: 10 capsule, Rfl: 0    metoprolol tartrate (LOPRESSOR) 25 MG tablet, Take 1 & 1/2 tablets (37.5 mg total) by mouth 2 (two) times daily. (Patient taking differently: Take 37.5 mg by mouth 2 (two) times daily. Patient taking 1/2 tablet daily), Disp: 90 tablet, Rfl: 0   multivitamin-lutein (OCUVITE-LUTEIN) CAPS capsule, Take 1 capsule by mouth daily with breakfast.  (Patient not taking: Reported on 03/08/2022), Disp: , Rfl:    ondansetron (ZOFRAN) 4  MG tablet, Take 1 tablet (4 mg total) by mouth every 6 (six) hours. (Patient not taking: Reported on 03/08/2022), Disp: 12 tablet, Rfl: 0   potassium chloride (KLOR-CON M) 10 MEQ tablet, Take 1 tablet (10 mEq total) by mouth daily., Disp: 30 tablet, Rfl: 0    Physical Exam: Blood pressure (!) 120/90, pulse (!) 120, height 5\' 10"  (1.778 m), weight 163 lb (73.9 kg).    Elderly male Blue lips with pursed breathing Inspiratory crackles bilateral  SEM JVP elevated Edema to mid thigh with lymph edema component    Labs:   Lab Results  Component Value Date   WBC 6.7 10/06/2021   HGB 14.0 10/06/2021   HCT 42.1 10/06/2021   MCV 96.1 10/06/2021   PLT 191 10/06/2021   No results for input(s): "NA", "K", "CL", "CO2", "BUN", "CREATININE", "CALCIUM", "PROT", "BILITOT", "ALKPHOS", "ALT", "AST", "GLUCOSE" in the last 168 hours.  Invalid input(s): "LABALBU" Lab Results  Component Value Date   CKTOTAL 37 (L) 05/02/2021    Lab Results  Component Value Date   CHOL 100 11/11/2018   Lab Results  Component Value Date   HDL 35 (L) 11/11/2018   Lab Results  Component Value Date   LDLCALC 54 11/11/2018   Lab Results  Component Value Date   TRIG 54 11/11/2018   Lab Results  Component Value Date   CHOLHDL 2.9 11/11/2018   No results found for: "LDLDIRECT"    Radiology: No results found.  EKG: afib nonspecific ST changes IvCD   ASSESSMENT AND PLAN:   Afib:  On cardizem, lopressor and eliquis will need to titrate AV nodal drugs in hospital As far as I can tell  afib is chronic and would stop low dose Tikosyn he is on  CAD:  Distant CABG no chest pain given age and lack of symptoms with DNR status no indication for stress testing Ischemic DCM:  update TTE in hospital to see what RV/LV function is assess PA pressures  He needs diuresis with iv lasix and likely change to demedex on d/c  DM:  Discussed low carb diet.  Target hemoglobin A1c is 6.5 or less.  Continue current medications. DVT:  right femoral vein by duplex 05/22/21   COPD:  ? Component of COPD will see what CXR / BNP looks like as he is hypoxic in office and I don't think it is just from CHF    Admit to [facility]metry [facility] IV diuresis Lasix Adjust AV nodal drugs D/C Tikosyn Consider CHF Team consult  DNR  Signed: Jenkins Rouge 03/08/2022, 3:19 PM

## 2022-03-08 ENCOUNTER — Encounter: Payer: Self-pay | Admitting: Cardiovascular Disease

## 2022-03-08 ENCOUNTER — Inpatient Hospital Stay (HOSPITAL_COMMUNITY)
Admit: 2022-03-08 | Discharge: 2022-03-12 | DRG: 291 | Disposition: A | Discharge status: Home / Self Care | Payer: Medicare Other | Attending: Cardiology | Admitting: Cardiology

## 2022-03-08 ENCOUNTER — Ambulatory Visit: Payer: Medicare Other | Attending: Cardiovascular Disease | Admitting: Cardiovascular Disease

## 2022-03-08 ENCOUNTER — Encounter (HOSPITAL_COMMUNITY): Payer: Self-pay

## 2022-03-08 ENCOUNTER — Inpatient Hospital Stay (HOSPITAL_COMMUNITY): Payer: Medicare Other

## 2022-03-08 VITALS — BP 120/90 | HR 120 | Ht 70.0 in | Wt 163.0 lb

## 2022-03-08 DIAGNOSIS — Z66 Do not resuscitate: Secondary | ICD-10-CM | POA: Diagnosis present

## 2022-03-08 DIAGNOSIS — I110 Hypertensive heart disease with heart failure: Principal | ICD-10-CM | POA: Diagnosis present

## 2022-03-08 DIAGNOSIS — J811 Chronic pulmonary edema: Secondary | ICD-10-CM | POA: Diagnosis not present

## 2022-03-08 DIAGNOSIS — Z86718 Personal history of other venous thrombosis and embolism: Secondary | ICD-10-CM

## 2022-03-08 DIAGNOSIS — Z951 Presence of aortocoronary bypass graft: Secondary | ICD-10-CM | POA: Diagnosis not present

## 2022-03-08 DIAGNOSIS — E785 Hyperlipidemia, unspecified: Secondary | ICD-10-CM | POA: Diagnosis present

## 2022-03-08 DIAGNOSIS — I3139 Other pericardial effusion (noninflammatory): Secondary | ICD-10-CM | POA: Diagnosis not present

## 2022-03-08 DIAGNOSIS — K219 Gastro-esophageal reflux disease without esophagitis: Secondary | ICD-10-CM | POA: Diagnosis not present

## 2022-03-08 DIAGNOSIS — R410 Disorientation, unspecified: Secondary | ICD-10-CM | POA: Diagnosis not present

## 2022-03-08 DIAGNOSIS — I5023 Acute on chronic systolic (congestive) heart failure: Secondary | ICD-10-CM | POA: Diagnosis not present

## 2022-03-08 DIAGNOSIS — I482 Chronic atrial fibrillation, unspecified: Secondary | ICD-10-CM

## 2022-03-08 DIAGNOSIS — I509 Heart failure, unspecified: Secondary | ICD-10-CM | POA: Diagnosis not present

## 2022-03-08 DIAGNOSIS — I5021 Acute systolic (congestive) heart failure: Secondary | ICD-10-CM | POA: Diagnosis not present

## 2022-03-08 DIAGNOSIS — Z9841 Cataract extraction status, right eye: Secondary | ICD-10-CM | POA: Diagnosis not present

## 2022-03-08 DIAGNOSIS — R0902 Hypoxemia: Secondary | ICD-10-CM

## 2022-03-08 DIAGNOSIS — I480 Paroxysmal atrial fibrillation: Secondary | ICD-10-CM | POA: Diagnosis present

## 2022-03-08 DIAGNOSIS — H9190 Unspecified hearing loss, unspecified ear: Secondary | ICD-10-CM | POA: Diagnosis not present

## 2022-03-08 DIAGNOSIS — Z7901 Long term (current) use of anticoagulants: Secondary | ICD-10-CM

## 2022-03-08 DIAGNOSIS — I2510 Atherosclerotic heart disease of native coronary artery without angina pectoris: Secondary | ICD-10-CM | POA: Diagnosis present

## 2022-03-08 DIAGNOSIS — E119 Type 2 diabetes mellitus without complications: Secondary | ICD-10-CM | POA: Diagnosis present

## 2022-03-08 DIAGNOSIS — I5031 Acute diastolic (congestive) heart failure: Secondary | ICD-10-CM | POA: Diagnosis not present

## 2022-03-08 DIAGNOSIS — J90 Pleural effusion, not elsewhere classified: Secondary | ICD-10-CM | POA: Diagnosis not present

## 2022-03-08 DIAGNOSIS — H9220 Otorrhagia, unspecified ear: Secondary | ICD-10-CM | POA: Diagnosis not present

## 2022-03-08 DIAGNOSIS — R21 Rash and other nonspecific skin eruption: Secondary | ICD-10-CM | POA: Diagnosis not present

## 2022-03-08 DIAGNOSIS — I4891 Unspecified atrial fibrillation: Secondary | ICD-10-CM | POA: Diagnosis not present

## 2022-03-08 DIAGNOSIS — Z8249 Family history of ischemic heart disease and other diseases of the circulatory system: Secondary | ICD-10-CM | POA: Diagnosis not present

## 2022-03-08 DIAGNOSIS — Z8673 Personal history of transient ischemic attack (TIA), and cerebral infarction without residual deficits: Secondary | ICD-10-CM | POA: Diagnosis not present

## 2022-03-08 DIAGNOSIS — R54 Age-related physical debility: Secondary | ICD-10-CM | POA: Diagnosis not present

## 2022-03-08 DIAGNOSIS — I890 Lymphedema, not elsewhere classified: Secondary | ICD-10-CM | POA: Diagnosis not present

## 2022-03-08 DIAGNOSIS — I252 Old myocardial infarction: Secondary | ICD-10-CM | POA: Diagnosis not present

## 2022-03-08 DIAGNOSIS — Z79899 Other long term (current) drug therapy: Secondary | ICD-10-CM

## 2022-03-08 DIAGNOSIS — J449 Chronic obstructive pulmonary disease, unspecified: Secondary | ICD-10-CM | POA: Diagnosis not present

## 2022-03-08 DIAGNOSIS — J9811 Atelectasis: Secondary | ICD-10-CM | POA: Diagnosis not present

## 2022-03-08 DIAGNOSIS — R0602 Shortness of breath: Secondary | ICD-10-CM | POA: Diagnosis not present

## 2022-03-08 DIAGNOSIS — Z9842 Cataract extraction status, left eye: Secondary | ICD-10-CM | POA: Diagnosis not present

## 2022-03-08 LAB — BRAIN NATRIURETIC PEPTIDE: B Natriuretic Peptide: 911.3 pg/mL — ABNORMAL HIGH (ref 0.0–100.0)

## 2022-03-08 LAB — CBC
HCT: 43.1 % (ref 39.0–52.0)
Hemoglobin: 14.7 g/dL (ref 13.0–17.0)
MCH: 33.5 pg (ref 26.0–34.0)
MCHC: 34.1 g/dL (ref 30.0–36.0)
MCV: 98.2 fL (ref 80.0–100.0)
Platelets: 129 10*3/uL — ABNORMAL LOW (ref 150–400)
RBC: 4.39 MIL/uL (ref 4.22–5.81)
RDW: 16 % — ABNORMAL HIGH (ref 11.5–15.5)
WBC: 6.6 10*3/uL (ref 4.0–10.5)
nRBC: 0 % (ref 0.0–0.2)

## 2022-03-08 LAB — BASIC METABOLIC PANEL
Anion gap: 9 (ref 5–15)
BUN: 21 mg/dL (ref 8–23)
CO2: 25 mmol/L (ref 22–32)
Calcium: 8.9 mg/dL (ref 8.9–10.3)
Chloride: 105 mmol/L (ref 98–111)
Creatinine, Ser: 1.12 mg/dL (ref 0.61–1.24)
GFR, Estimated: >60 mL/min (ref >60)
Glucose, Bld: 92 mg/dL (ref 70–99)
Potassium: 3.9 mmol/L (ref 3.5–5.1)
Sodium: 139 mmol/L (ref 135–145)

## 2022-03-08 LAB — HEMOGLOBIN A1C
Hgb A1c MFr Bld: 5 % (ref 4.8–5.6)
Mean Plasma Glucose: 96.8 mg/dL

## 2022-03-08 LAB — MAGNESIUM: Magnesium: 2.2 mg/dL (ref 1.7–2.4)

## 2022-03-08 MED ORDER — EMPAGLIFLOZIN 10 MG PO TABS
10.0000 mg | ORAL_TABLET | Freq: Every day | ORAL | Status: DC
  Administered 2022-03-08 – 2022-03-12 (×5): 10 mg via ORAL
  Filled 2022-03-08 (×5): qty 1

## 2022-03-08 MED ORDER — TORSEMIDE 20 MG PO TABS: 40.0000 mg | ORAL_TABLET | Freq: Two times a day (BID) | ORAL | 3 refills | Status: DC

## 2022-03-08 MED ORDER — APIXABAN 5 MG PO TABS
5.0000 mg | ORAL_TABLET | Freq: Two times a day (BID) | ORAL | Status: DC
  Administered 2022-03-08 – 2022-03-12 (×8): 5 mg via ORAL
  Filled 2022-03-08 (×8): qty 1

## 2022-03-08 MED ORDER — METOPROLOL TARTRATE 25 MG PO TABS
37.5000 mg | ORAL_TABLET | Freq: Two times a day (BID) | ORAL | Status: DC
  Administered 2022-03-08: 37.5 mg via ORAL
  Filled 2022-03-08: qty 1

## 2022-03-08 MED ORDER — FUROSEMIDE 10 MG/ML IJ SOLN
80.0000 mg | Freq: Two times a day (BID) | INTRAMUSCULAR | Status: DC
  Administered 2022-03-08 – 2022-03-12 (×8): 80 mg via INTRAVENOUS
  Filled 2022-03-08 (×8): qty 8

## 2022-03-08 MED ORDER — ROSUVASTATIN CALCIUM 20 MG PO TABS
20.0000 mg | ORAL_TABLET | Freq: Every day | ORAL | Status: DC
  Administered 2022-03-08 – 2022-03-12 (×5): 20 mg via ORAL
  Filled 2022-03-08 (×5): qty 1

## 2022-03-08 MED ORDER — ACETAMINOPHEN 325 MG PO TABS
650.0000 mg | ORAL_TABLET | ORAL | Status: DC | PRN
  Filled 2022-03-08: qty 2

## 2022-03-08 MED ORDER — SODIUM CHLORIDE 0.9% FLUSH
3.0000 mL | Freq: Two times a day (BID) | INTRAVENOUS | Status: DC
  Administered 2022-03-08 – 2022-03-11 (×7): 3 mL via INTRAVENOUS

## 2022-03-08 MED ORDER — INSULIN ASPART 100 UNIT/ML IJ SOLN
0.0000 [IU] | Freq: Three times a day (TID) | INTRAMUSCULAR | Status: DC
  Administered 2022-03-10: 1 [IU] via SUBCUTANEOUS

## 2022-03-08 MED ORDER — FUROSEMIDE 10 MG/ML IJ SOLN
100.0000 mg | Freq: Two times a day (BID) | INTRAVENOUS | Status: DC
  Filled 2022-03-08 (×2): qty 10

## 2022-03-08 MED ORDER — SODIUM CHLORIDE 0.9 % IV SOLN: 250.0000 mL | INTRAVENOUS | Status: DC | PRN

## 2022-03-08 MED ORDER — SODIUM CHLORIDE 0.9% FLUSH: 3.0000 mL | INTRAVENOUS | Status: DC | PRN

## 2022-03-08 NOTE — Patient Instructions (Addendum)
Medication Instructions:  Your physician has recommended you make the following change in your medication:  1-STOP Lasix (furosemide) and STOP Tikosyn 2-START Demadex 40 mg by mouth twice daily  *If you need a refill on your cardiac medications before your next appointment, please call your pharmacy*  Lab Work: If you have labs (blood work) drawn today and your tests are completely normal, you will receive your results only by: [place] (if you have MyChart) OR A paper copy in the mail If you have any lab test that is abnormal or we need to change your treatment, we will call you to review the results.  Follow-Up: At [facility], you and your health needs are our priority.  As part of our continuing mission to provide you with exceptional heart care, we have created designated Provider Care Teams.  These Care Teams include your primary Cardiologist (physician) and Advanced Practice Providers (APPs -  Physician Assistants and Nurse Practitioners) who all work together to provide you with the care you need, when you need it.  We recommend signing up for the patient portal called "MyChart".  Sign up information is provided on this After Visit Summary.  MyChart is used to connect with patients for Virtual Visits (Telemedicine).  Patients are able to view lab/test results, encounter notes, upcoming appointments, etc.  Non-urgent messages can be sent to your provider as well.   To learn more about what you can do with MyChart, go to [email or web address].    Your next appointment:   To be determined after discharge  The format for your next appointment:   In Person  Provider:   Jenkins Rouge, MD     Please go to [facility] Emergency Department after 5:00 pm today, and let them know you are a direct admit.  Important Information About Sugar

## 2022-03-08 NOTE — Progress Notes (Signed)
Pt arrived to 3E, VSS, CHG complete, MD paged, oriented to unit, call light within reach. Orders checked  Alvis Lemmings, RN 03/08/2022 6:08 PM

## 2022-03-08 NOTE — H&P (Addendum)
CARDIOLOGY H&P       Patient ID: Edgar Thomas MRN: [id number] DOB/AGE: Apr 06, 1930 88 y.o.  Admit date: 03/08/2022 Referring Physician: Nedra Hai Primary Physician: Simone Curia, MD Primary Cardiologist: New Reason for Consultation: CAD/PAF/CHF  Active Problems:   * No active hospital problems. *   HPI:  88 y.o. DNR referred by DR Nedra Hai for CAD/CABG/PAF/ and CHF Last seen by me in 2018 History of CABG in 2015 LIMA to LAD, Sequentiaql SVG to OM1/2, and SVG to PDA. Last echo in 2019 at [place] showed EF 35-40% with moderate MR Admitted 05/20/21 with metabolic encephalopathy flu positive and right femoral vein DVT Prior history of stroke was on eliquis and oncology did not recommend changing DOAC has been on Tikosyn for afib but appears chronic No CHF exacerbations or chest pains On beta blocker and lasix but ARB appears to have been stopped BUN 24 Cr 0.85 Note allergy to amiodarone   He is actually the father of one of my patients Edgar Thomas who has had MVR. His wife Edgar Thomas is about 20 years younger than him He has 5 kids and she had one before they married She seems to be very attentive and cares for him well  IN office he moves slowly He has pursed lipped breathing and we are unable to get a good sat reading on him. He is in rapid afib rates 120 and he has gross volume overload with edema to mid thigh and component of lymph edema   Discussed need for hospitalization with them and they are willing to be admitted for diuresis and further w/u He confirms DNR status  [facility] admitting and he will go to ER after 5:00 pm for direct admission to telemetry floor   ROS All other systems reviewed and negative except as noted above  Past Medical History:  Diagnosis Date   A-fib ([facility])    Anemia    CAD (coronary artery disease)    Constipation    GERD (gastroesophageal reflux disease)    Heart attack ([facility])    Heartburn    Hyperlipemia    Hypertension    Post herpetic neuralgia    Stroke (cerebrum)  (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   Left lenticulostriate and external capsule   Stroke ([facility])    TIA (transient ischemic attack)    Vitamin D deficiency     Family History  Problem Relation Age of Onset   Heart disease Mother        died at age 89 of heart attack   Leukemia Sister    Heart disease Brother    Hypertension Brother     Social History   Socioeconomic History   Marital status: Married    Spouse name: kim   Number of children: 6   Years of education: Not on file   Highest education level: Not on file  Occupational History   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  Tobacco Use   Smoking status: Never   Smokeless tobacco: Never  Substance and Sexual Activity   Alcohol use: No   Drug use: No   Sexual activity: Not on file  Other Topics Concern   Not on file  Social History Narrative   Lives with wife   Right Handed   Drinks no caffeine   Social Determinants of Health   Financial Resource Strain: Not on file  Food Insecurity: Not on file  Transportation Needs: Not on file  Physical Activity: Not on file  Stress: Not on file  Social Connections: Not on file  Intimate Partner Violence: Not on file    Past Surgical History:  Procedure Laterality Date   balloon angioplasty of LAD     CARDIOVERSION  2018   a fib   CATARACT EXTRACTION, BILATERAL  1019, 2022   COLONOSCOPY     CORONARY ARTERY BYPASS GRAFT N/A 12/03/2013   Procedure: CORONARY ARTERY BYPASS GRAFTING (CABG) x4 using left internal mammary artery and right greater saphenous vein. LIMA to LAD, sequential SVG to OM 1 & OM 2, SVG to PD;  Surgeon: Grace Isaac, MD;  Location: [facility];  Service: Open Heart Surgery;  Laterality: N/A;   ENDOVEIN HARVEST OF GREATER SAPHENOUS VEIN Right 12/03/2013   Procedure: ENDOVEIN HARVEST OF GREATER SAPHENOUS VEIN;  Surgeon: Grace Isaac, MD;  Location: [facility];  Service: Open Heart Surgery;  Laterality: Right;   INTRAOPERATIVE TRANSESOPHAGEAL ECHOCARDIOGRAM N/A 12/03/2013   Procedure: INTRAOPERATIVE  TRANSESOPHAGEAL ECHOCARDIOGRAM;  Surgeon: Grace Isaac, MD;  Location: [place];  Service: Open Heart Surgery;  Laterality: N/A;     No current facility-administered medications for this encounter.    Physical Exam: There were no vitals taken for this visit.    Elderly male Blue lips with pursed breathing Inspiratory crackles bilateral  SEM JVP elevated Edema to mid thigh with lymph edema component    Labs:   Lab Results  Component Value Date   WBC 6.7 10/06/2021   HGB 14.0 10/06/2021   HCT 42.1 10/06/2021   MCV 96.1 10/06/2021   PLT 191 10/06/2021   No results for input(s): "NA", "K", "CL", "CO2", "BUN", "CREATININE", "CALCIUM", "PROT", "BILITOT", "ALKPHOS", "ALT", "AST", "GLUCOSE" in the last 168 hours.  Invalid input(s): "LABALBU" Lab Results  Component Value Date   CKTOTAL 37 (L) 05/02/2021    Lab Results  Component Value Date   CHOL 100 11/11/2018   Lab Results  Component Value Date   HDL 35 (L) 11/11/2018   Lab Results  Component Value Date   LDLCALC 54 11/11/2018   Lab Results  Component Value Date   TRIG 54 11/11/2018   Lab Results  Component Value Date   CHOLHDL 2.9 11/11/2018   No results found for: "LDLDIRECT"    Radiology: No results found.  EKG: afib nonspecific ST changes IvCD   ASSESSMENT AND PLAN:   Afib:  On cardizem, lopressor and eliquis will need to titrate AV nodal drugs in hospital As far as I can tell afib is chronic and would stop low dose Tikosyn he is on  CAD:  Distant CABG no chest pain given age and lack of symptoms with DNR status no indication for stress testing Ischemic DCM:  update TTE in hospital to see what RV/LV function is assess PA pressures  He needs diuresis with iv lasix and likely change to demedex on d/c  DM:  Discussed low carb diet.  Target hemoglobin A1c is 6.5 or less.  Continue current medications. DVT:  right femoral vein by duplex 05/22/21   COPD:  ? Component of COPD will see what CXR / BNP looks  like as he is hypoxic in office and I don't think it is just from CHF    Admit to [facility] IV diuresis Lasix Adjust AV nodal drugs D/C Tikosyn Consider CHF Team consult  DNR    Above note is obtained from office cardiology note performed today by Dr [name], will serve as admission H&P.   Additionally:  Patient seen upon arrival to 3E18, breathing comfortably, wife at bedside, reports BLE  edema with weeping for 2 month, on lasix 40mg  BID at home, urinates minimally. He has no chest pain. He had some ear bleeding wife wants it to be examined. Cardiac S1S2, irregularly irregular, VR 80s. BLE edema up to abdomen. Rash noted of BLE. Very hard of hearing. No focal neuro deficit. Globally weak.   -Start IV Lasix 80mg  BID, monitor intake and output, daily weight  -Check CBC, BMP, MAG, BNP, CXR, ECHO -Consider ENT consult tomorrow  -Continue metoprolol and eliquis for A fib, stop cardizem and tikosyn, further adjustment pending Echo result  -Resume jardiance, start insulin AC, check A1C

## 2022-03-09 ENCOUNTER — Inpatient Hospital Stay (HOSPITAL_COMMUNITY): Payer: Medicare Other

## 2022-03-09 ENCOUNTER — Encounter (HOSPITAL_COMMUNITY): Payer: Self-pay | Admitting: Cardiovascular Disease

## 2022-03-09 DIAGNOSIS — I5031 Acute diastolic (congestive) heart failure: Secondary | ICD-10-CM

## 2022-03-09 DIAGNOSIS — I4891 Unspecified atrial fibrillation: Secondary | ICD-10-CM

## 2022-03-09 LAB — BASIC METABOLIC PANEL
Anion gap: 5 (ref 5–15)
BUN: 24 mg/dL — ABNORMAL HIGH (ref 8–23)
CO2: 28 mmol/L (ref 22–32)
Calcium: 8.5 mg/dL — ABNORMAL LOW (ref 8.9–10.3)
Chloride: 105 mmol/L (ref 98–111)
Creatinine, Ser: 1.18 mg/dL (ref 0.61–1.24)
GFR, Estimated: 59 mL/min — ABNORMAL LOW (ref >60)
Glucose, Bld: 90 mg/dL (ref 70–99)
Potassium: 3.8 mmol/L (ref 3.5–5.1)
Sodium: 138 mmol/L (ref 135–145)

## 2022-03-09 LAB — ECHOCARDIOGRAM COMPLETE
Area-P 1/2: 4.61 cm2
Calc EF: 25 %
Height: 70 in
MV M vel: 4.96 m/s
MV Peak grad: 98.4 mmHg
P 1/2 time: 516 msec
Radius: 0.5 cm
S' Lateral: 5 cm
Single Plane A2C EF: 22.5 %
Single Plane A4C EF: 23 %
Weight: 2713.6 oz

## 2022-03-09 LAB — GLUCOSE, CAPILLARY
Glucose-Capillary: 123 mg/dL — ABNORMAL HIGH (ref 70–99)
Glucose-Capillary: 124 mg/dL — ABNORMAL HIGH (ref 70–99)
Glucose-Capillary: 205 mg/dL — ABNORMAL HIGH (ref 70–99)

## 2022-03-09 LAB — MAGNESIUM: Magnesium: 2.1 mg/dL (ref 1.7–2.4)

## 2022-03-09 MED ORDER — PREDNISONE 20 MG PO TABS
40.0000 mg | ORAL_TABLET | Freq: Every day | ORAL | Status: DC
  Administered 2022-03-09 – 2022-03-11 (×3): 40 mg via ORAL
  Filled 2022-03-09 (×3): qty 2

## 2022-03-09 MED ORDER — LEVOTHYROXINE SODIUM 50 MCG PO TABS
50.0000 ug | ORAL_TABLET | Freq: Every day | ORAL | Status: DC
  Administered 2022-03-09 – 2022-03-12 (×4): 50 ug via ORAL
  Filled 2022-03-09 (×4): qty 1

## 2022-03-09 MED ORDER — METOPROLOL TARTRATE 50 MG PO TABS
50.0000 mg | ORAL_TABLET | Freq: Two times a day (BID) | ORAL | Status: DC
  Administered 2022-03-09 – 2022-03-12 (×7): 50 mg via ORAL
  Filled 2022-03-09 (×7): qty 1

## 2022-03-09 MED ORDER — POTASSIUM CHLORIDE CRYS ER 20 MEQ PO TBCR
40.0000 meq | EXTENDED_RELEASE_TABLET | Freq: Once | ORAL | Status: AC
  Administered 2022-03-09: 40 meq via ORAL
  Filled 2022-03-09: qty 2

## 2022-03-09 NOTE — Progress Notes (Signed)
  Echocardiogram 2D Echocardiogram has been performed.  Edgar Thomas 03/09/2022, 2:39 PM

## 2022-03-09 NOTE — Progress Notes (Signed)
   03/09/22 1600  Mobility  Activity Ambulated with assistance in room  Level of Assistance Minimal assist, patient does 75% or more  Assistive Device Front wheel walker  Distance Ambulated (ft) 4 ft  Activity Response Tolerated well  Mobility Referral Yes  $Mobility charge 1 Mobility   Mobility Specialist Progress Note  Pt was in bed and agreeable. Mobility cut short d/t pt stating he feels "unstable". Returned to bed w/ all needs met and call bell in reach. RN notified.   Edgar Thomas Mobility Specialist

## 2022-03-09 NOTE — Progress Notes (Addendum)
Rounding Note    Patient Name: Edgar Thomas Date of Encounter: 03/09/2022  [place] Cardiologist: Jenkins Rouge, MD   Subjective   Pt breathing a little better   No CP    Inpatient Medications    Scheduled Meds:  apixaban  5 mg Oral BID   empagliflozin  10 mg Oral Daily   furosemide  80 mg Intravenous BID   insulin aspart  0-6 Units Subcutaneous TID WC   metoprolol tartrate  37.5 mg Oral BID   rosuvastatin  20 mg Oral Daily   sodium chloride flush  3 mL Intravenous Q12H   Continuous Infusions:  sodium chloride     PRN Meds: sodium chloride, acetaminophen, sodium chloride flush   Vital Signs    Vitals:   03/08/22 1803 03/08/22 1949 03/09/22 0156 03/09/22 0700  BP:    (!) 123/97  Pulse: 95 86    Resp:  20 20 20   Temp:  (!) 97.5 F (36.4 C) (!) 97.2 F (36.2 C) (!) 97.3 F (36.3 C)  TempSrc:  Oral Oral Oral  SpO2: 100%  99% 96%  Weight:   76.9 kg     Intake/Output Summary (Last 24 hours) at 03/09/2022 0823 Last data filed at 03/09/2022 0700 Gross per 24 hour  Intake 0 ml  Output 875 ml  Net -875 ml      03/09/2022    1:56 AM 03/08/2022    3:06 PM 05/23/2021    5:21 AM  Last 3 Weights  Weight (lbs) 169 lb 9.6 oz 163 lb 136 lb 11 oz  Weight (kg) 76.93 kg 73.936 kg 62 kg      Telemetry    Afib  80s to 100s  - Personally Reviewed  ECG    No new  - Personally Reviewed  Physical Exam   GEN: Thin 88 yo in no acute distress.   Neck: JVP is increased  Cardiac: Irreg irreg  II/VI systolic murmur Respiratory: Decreased airflow   Marked congestion.   Mild rales at bases   GI: Soft, nontender, non-distended  MS: 1+ edema; No deformity.  Skin with some blistery erythematous lesions shins. Neuro:  Nonfocal  Psych: Normal affect   Labs    High Sensitivity Troponin:  No results for input(s): "TROPONINIHS" in the last 720 hours.   Chemistry Recent Labs  Lab 03/08/22 1825 03/09/22 0043  NA 139 138  K 3.9 3.8  CL 105 105  CO2 25 28   GLUCOSE 92 90  BUN 21 24*  CREATININE 1.12 1.18  CALCIUM 8.9 8.5*  MG 2.2 2.1  GFRNONAA >60 59*  ANIONGAP 9 5    Lipids No results for input(s): "CHOL", "TRIG", "HDL", "LABVLDL", "LDLCALC", "CHOLHDL" in the last 168 hours.  Hematology Recent Labs  Lab 03/08/22 1825  WBC 6.6  RBC 4.39  HGB 14.7  HCT 43.1  MCV 98.2  MCH 33.5  MCHC 34.1  RDW 16.0*  PLT 129*   Thyroid No results for input(s): "TSH", "FREET4" in the last 168 hours.  BNP Recent Labs  Lab 03/08/22 1825  BNP 911.3*    DDimer No results for input(s): "DDIMER" in the last 168 hours.   Radiology    DG Chest 2 View  Result Date: 03/08/2022 CLINICAL DATA:  Shortness of breath. EXAM: CHEST - 2 VIEW COMPARISON:  Chest radiograph 10/06/2021 and earlier; CT angio chest 05/21/2021 FINDINGS: Stable enlarged cardiac silhouette. Postoperative changes of median sternotomy and coronary artery bypass graft. Aortic calcifications. Retrocardiac  airspace opacities in the left lower lobe with small layering left pleural effusion. Subsegmental atelectasis in the right lung base. Mild diffuse hazy interstitial thickening and prominence of the hilar vasculature. No pneumothorax. Anterior wedging of a distal thoracic vertebral body is similar to prior exam. IMPRESSION: Stable cardiomegaly with mild pulmonary edema and new small left layering pleural effusion. Left lower lobe opacities likely reflect combination of pleural fluid and adjacent atelectasis, though superimposed infection cannot be excluded. Aortic Atherosclerosis (ICD10-I70.0). Electronically Signed   By: Sherron Ales M.D.   On: 03/08/2022 19:02    Cardiac Studies     Patient Profile     88 y.o. male with Hx of CAD (CABG 2015), PAF, HFrEF, MR , CVA   Admitted from [facility] on 10/11 for Afib with RVR and volume overload.    Assessment & Plan    1  Atrial fibrillation  pt presented to clinic yesterday in atrial fib    Had been on low dose Tikosyn   This ws stopped   Had  been on Eliquis, cardiazem and lopressor Rates are improved from admit   Keep on same regimen for now  Echo pending   2  CAD  S/p CABG 2015 (LIMA to LAD; SVG to OM1/2; SVG to PDA)    No CP    Echo pending   3  HFrEF   Echo in 2019 LVEf 35 to 40%    Patient has diuresed some    Legs improved   Will continue IV lasix      4  MR   Moderate on echo in 2019    Repeat echo   5  ? COPD   Pt with a lot of  congestion  Ratttles.  Nonproductive cough     CXR with evid of edema, cannot r/o inflitrate    Will give 40 mg prednisone to Rx any pulmonary inflamation   Follow  repsonse Continue diuresis  I am not convinced of active bacterial infection.   6  Hx CVA   Keep on Eliquis      7  Hx of DM  A1C now 5     8 Hx DVT   R femoral v in Dec 2022    9   Hx HTN  BP is OK   10  HL  Keep on Crestor    11   DNR For questions or updates, please contact [facility] Please consult [email or web address] for contact info under        Signed, Dietrich Pates, MD  03/09/2022, 8:23 AM

## 2022-03-09 NOTE — Progress Notes (Signed)
Heart Failure Nurse Navigator Progress Note  PCP: [name], MD PCP-Cardiologist: Edmonia James., MD Admission Diagnosis: CHF Admitted from: home with spouse  Presentation:   Edgar Thomas presented 10/11 with increased SOB and BLE edema. Pt resting comfortably in bed, on room air, HOB 40. Spouse at bedside. Upon completion of SDOH, no immediate social needs noted. BLE +1 pitting.  Spouse drives patient to appointments. Spouse helps set up medications. Will continue to follow this hospitalization to determine need for HV TOC appt upon DC.   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 lenticulostriate and external capsule   Stroke (HCC)    TIA (transient ischemic attack)    Vitamin D deficiency      Social History   Socioeconomic History   Marital status: Married    Spouse name: kim   Number of children: 6   Years of education: Not on file   Highest education level: Not on file  Occupational History   Occupation: retired    Comment: Research officer, trade [employer]  Tobacco Use   Smoking status: Never   Smokeless tobacco: Never  Substance and Sexual Activity   Alcohol use: No   Drug use: No   Sexual activity: Not on file  Other Topics Concern   Not on file  Social History Narrative   Lives with wife   Right Handed   Drinks no caffeine   Social Determinants of Health   Financial Resource Strain: Low Risk  (03/09/2022)   Overall Financial Resource Strain (CARDIA)    Difficulty of Paying Living Expenses: Not hard at all  Food Insecurity: No Food Insecurity (03/09/2022)   Hunger Vital Sign    Worried About Running Out of Food in the Last Year: Never true    Ran Out of Food in the Last  Year: Never true  Transportation Needs: No Transportation Needs (03/09/2022)   PRAPARE - Hydrologist (Medical): No    Lack of Transportation (Non-Medical): No  Physical Activity: Not on file  Stress: Not on file  Social Connections: Not on file    High Risk Criteria for Readmission and/or Poor Patient Outcomes: Heart failure hospital admissions (last 6 months): 1  No Show rate: 1% Difficult social situation: no Demonstrates medication adherence: yes Primary Language: English Literacy level: able to read/write and comprehend. Wears glasses. Spouse very attentive and helpful.   Barriers of Care:   -none  Considerations/Referrals:   Referral made to Heart Failure Pharmacist Stewardship: yes, appreciated Referral made to Heart Failure CSW/NCM TOC: no Referral made to Heart & Vascular TOC clinic: pending.   Items for Follow-up on DC/TOC: Pending plan of care/stay.  Will plan to visit again tomorrow.   Pricilla Holm, MSN, RN Heart Failure Nurse Navigator

## 2022-03-10 ENCOUNTER — Encounter (HOSPITAL_COMMUNITY): Payer: Self-pay | Admitting: Cardiovascular Disease

## 2022-03-10 DIAGNOSIS — I5021 Acute systolic (congestive) heart failure: Secondary | ICD-10-CM | POA: Diagnosis not present

## 2022-03-10 DIAGNOSIS — R410 Disorientation, unspecified: Secondary | ICD-10-CM | POA: Diagnosis not present

## 2022-03-10 DIAGNOSIS — J90 Pleural effusion, not elsewhere classified: Secondary | ICD-10-CM

## 2022-03-10 DIAGNOSIS — I5023 Acute on chronic systolic (congestive) heart failure: Secondary | ICD-10-CM

## 2022-03-10 LAB — BASIC METABOLIC PANEL
Anion gap: 8 (ref 5–15)
Anion gap: 9 (ref 5–15)
BUN: 23 mg/dL (ref 8–23)
BUN: 30 mg/dL — ABNORMAL HIGH (ref 8–23)
CO2: 28 mmol/L (ref 22–32)
CO2: 25 mmol/L (ref 22–32)
Calcium: 9 mg/dL (ref 8.9–10.3)
Calcium: 8.8 mg/dL — ABNORMAL LOW (ref 8.9–10.3)
Chloride: 105 mmol/L (ref 98–111)
Chloride: 104 mmol/L (ref 98–111)
Creatinine, Ser: 1.19 mg/dL (ref 0.61–1.24)
Creatinine, Ser: 1.28 mg/dL — ABNORMAL HIGH (ref 0.61–1.24)
GFR, Estimated: 58 mL/min — ABNORMAL LOW (ref >60)
GFR, Estimated: 53 mL/min — ABNORMAL LOW (ref >60)
Glucose, Bld: 128 mg/dL — ABNORMAL HIGH (ref 70–99)
Glucose, Bld: 177 mg/dL — ABNORMAL HIGH (ref 70–99)
Potassium: 4.3 mmol/L (ref 3.5–5.1)
Potassium: 4 mmol/L (ref 3.5–5.1)
Sodium: 141 mmol/L (ref 135–145)
Sodium: 138 mmol/L (ref 135–145)

## 2022-03-10 LAB — GLUCOSE, CAPILLARY
Glucose-Capillary: 91 mg/dL (ref 70–99)
Glucose-Capillary: 118 mg/dL — ABNORMAL HIGH (ref 70–99)
Glucose-Capillary: 131 mg/dL — ABNORMAL HIGH (ref 70–99)
Glucose-Capillary: 185 mg/dL — ABNORMAL HIGH (ref 70–99)
Glucose-Capillary: 124 mg/dL — ABNORMAL HIGH (ref 70–99)

## 2022-03-10 LAB — BRAIN NATRIURETIC PEPTIDE: B Natriuretic Peptide: 1431 pg/mL — ABNORMAL HIGH (ref 0.0–100.0)

## 2022-03-10 NOTE — TOC Progression Note (Signed)
Transition of Care [facility]) - Progression Note    Patient Details  Name: Edgar Thomas MRN: [id number] Date of Birth: 1929/08/31  [facility] CM/SW Contact  Zenon Mayo, RN Phone Number: 03/10/2022, 5:25 PM  Clinical Narrative:    from home with wife, HF Ex, afib, iv lasix.  TOC following.        Expected Discharge Plan and Services                                                 Social Determinants of Health (SDOH) Interventions Food Insecurity Interventions: Intervention Not Indicated Housing Interventions: Intervention Not Indicated Transportation Interventions: Intervention Not Indicated Utilities Interventions: Intervention Not Indicated Financial Strain Interventions: Intervention Not Indicated  Readmission Risk Interventions     No data to display

## 2022-03-10 NOTE — Progress Notes (Signed)
Heart Failure Nurse Navigator Progress Note  Spoke with patient and spouse this AM regarding plan of stay and follow up care. Reviewed pt BLE, noted increased edema on R LE vs yesterday. Requested TED hose from cardiology via secure chat.   PCCM consulted, entered room. '   Will continue to follow to determine HV TOC readiness.    Pricilla Holm, MSN, RN Heart Failure Nurse Navigator

## 2022-03-10 NOTE — Care Management Important Message (Signed)
Important Message  Patient Details  Name: Edgar Thomas MRN: [id number] Date of Birth: Oct 31, 1929   Medicare Important Message Given:  Yes     Shelda Altes 03/10/2022, 8:23 AM

## 2022-03-10 NOTE — Progress Notes (Signed)
Patient seen and examined at bedside. Full consult note to follow. No plans for thora today. Will discuss with primary team.  Julian Hy, DO 03/10/22 10:50 AM [facility]

## 2022-03-10 NOTE — Progress Notes (Signed)
Rounding Note    Patient Name: Edgar Thomas Date of Encounter: 03/10/2022  [place] Cardiologist: Jenkins Rouge, MD   Subjective   Breathing may be a little better   No CP   Inpatient Medications    Scheduled Meds:  apixaban  5 mg Oral BID   empagliflozin  10 mg Oral Daily   furosemide  80 mg Intravenous BID   insulin aspart  0-6 Units Subcutaneous TID WC   levothyroxine  50 mcg Oral Q0600   metoprolol tartrate  50 mg Oral BID   predniSONE  40 mg Oral Daily   rosuvastatin  20 mg Oral Daily   sodium chloride flush  3 mL Intravenous Q12H   Continuous Infusions:  sodium chloride     PRN Meds: sodium chloride, acetaminophen, sodium chloride flush   Vital Signs    Vitals:   03/09/22 1836 03/09/22 2128 03/10/22 0052 03/10/22 0600  BP: (!) 125/92 110/89 119/86 (!) 127/98  Pulse: (!) 117 98 99 88  Resp:  18 18 18   Temp:  98.4 F (36.9 C) 98.4 F (36.9 C) 97.6 F (36.4 C)  TempSrc:  Oral Oral Oral  SpO2: 100% 97% 91% 91%  Weight:        Intake/Output Summary (Last 24 hours) at 03/10/2022 0652 Last data filed at 03/10/2022 0600 Gross per 24 hour  Intake 60 ml  Output 4000 ml  Net -3940 ml      03/09/2022    1:56 AM 03/08/2022    3:06 PM 05/23/2021    5:21 AM  Last 3 Weights  Weight (lbs) 169 lb 9.6 oz 163 lb 136 lb 11 oz  Weight (kg) 76.93 kg 73.936 kg 62 kg      Telemetry    Afib  80s   - Personally Reviewed  ECG    No new  - Personally Reviewed  Physical Exam   GEN: Thin 88 yo in no acute distress.   Neck: JVP is increased  Cardiac: Irreg irreg  II/VI systolic murmur Respiratory:Continued decreased aiflow     Rhonchi GI: Soft, nontender, non-distended  MS: 1+ edema; No deformity.  Skin with some blistery erythematous lesions shins. Neuro:  Nonfocal  Psych: Normal affect   Labs    High Sensitivity Troponin:  No results for input(s): "TROPONINIHS" in the last 720 hours.   Chemistry Recent Labs  Lab 03/08/22 1825  03/09/22 0043 03/10/22 0131  NA 139 138 141  K 3.9 3.8 4.3  CL 105 105 105  CO2 25 28 28   GLUCOSE 92 90 128*  BUN 21 24* 23  CREATININE 1.12 1.18 1.19  CALCIUM 8.9 8.5* 9.0  MG 2.2 2.1  --   GFRNONAA >60 59* 58*  ANIONGAP 9 5 8     Lipids No results for input(s): "CHOL", "TRIG", "HDL", "LABVLDL", "LDLCALC", "CHOLHDL" in the last 168 hours.  Hematology Recent Labs  Lab 03/08/22 1825  WBC 6.6  RBC 4.39  HGB 14.7  HCT 43.1  MCV 98.2  MCH 33.5  MCHC 34.1  RDW 16.0*  PLT 129*   Thyroid No results for input(s): "TSH", "FREET4" in the last 168 hours.  BNP Recent Labs  Lab 03/08/22 1825  BNP 911.3*     Net Neg 4.1 L   DDimer No results for input(s): "DDIMER" in the last 168 hours.   Radiology    ECHOCARDIOGRAM COMPLETE  Result Date: 03/09/2022    ECHOCARDIOGRAM REPORT   Patient Name:   Edgar Thomas Date  of Exam: 03/09/2022 Medical Rec #:  [id number]    Height:       70.0 in Accession #:    [id number]   Weight:       169.6 lb Date of Birth:  [birth year]     BSA:          1.946 m Patient Age:    88 years     BP:           124/107 mmHg Patient Gender: M            HR:           117 bpm. Exam Location:  Inpatient Procedure: 2D Echo, Cardiac Doppler and Color Doppler Indications:    CHF - acute diastolic  History:        Patient has prior history of Echocardiogram examinations, most                 recent 08/15/2017. CHF, CAD, Prior CABG, Stroke,                 Arrythmias:Atrial Fibrillation; Risk Factors:Hypertension and                 Dyslipidemia.  Sonographer:    Milda Smart Referring Phys: [id number] Cyndi Bender  Sonographer Comments: Image acquisition challenging due to patient body habitus and Image acquisition challenging due to respiratory motion. IMPRESSIONS  1. Left ventricular ejection fraction, by estimation, is 20 to 25%. The left ventricle has severely decreased function. The left ventricle demonstrates global hypokinesis. The left ventricular internal cavity size was  mildly to moderately dilated. Indeterminate diastolic filling due to E-A fusion. Elevated left ventricular end-diastolic pressure.  2. Right ventricular systolic function is normal. The right ventricular size is normal. There is normal pulmonary artery systolic pressure.  3. Left atrial size was severely dilated.  4. Right atrial size was severely dilated.  5. A small pericardial effusion is present. There is no evidence of cardiac tamponade. Large pleural effusion in the left lateral region.  6. The mitral valve is normal in structure. Mild to moderate mitral valve regurgitation. No evidence of mitral stenosis.  7. The aortic valve is tricuspid. There is mild calcification of the aortic valve. There is mild thickening of the aortic valve. Aortic valve regurgitation is mild. No aortic stenosis is present.  8. Aortic dilatation noted. There is mild dilatation of the aortic root, measuring 36 mm. There is moderate dilatation of the ascending aorta, measuring 43 mm.  9. The inferior vena cava is dilated in size with <50% respiratory variability, suggesting right atrial pressure of 15 mmHg. FINDINGS  Left Ventricle: Left ventricular ejection fraction, by estimation, is 20 to 25%. The left ventricle has severely decreased function. The left ventricle demonstrates global hypokinesis. The left ventricular internal cavity size was mildly to moderately dilated. There is no left ventricular hypertrophy. Indeterminate diastolic filling due to E-A fusion. Elevated left ventricular end-diastolic pressure. Right Ventricle: The right ventricular size is normal. No increase in right ventricular wall thickness. Right ventricular systolic function is normal. There is normal pulmonary artery systolic pressure. The tricuspid regurgitant velocity is 1.68 m/s, and  with an assumed right atrial pressure of 15 mmHg, the estimated right ventricular systolic pressure is 26.3 mmHg. Left Atrium: Left atrial size was severely dilated. Right  Atrium: Right atrial size was severely dilated. Pericardium: A small pericardial effusion is present. There is no evidence of cardiac tamponade. Mitral Valve: The mitral valve is normal in structure.  Mild to moderate mitral valve regurgitation. No evidence of mitral valve stenosis. Tricuspid Valve: The tricuspid valve is normal in structure. Tricuspid valve regurgitation is mild . No evidence of tricuspid stenosis. Aortic Valve: The aortic valve is tricuspid. There is mild calcification of the aortic valve. There is mild thickening of the aortic valve. Aortic valve regurgitation is mild. Aortic regurgitation PHT measures 516 msec. No aortic stenosis is present. Pulmonic Valve: The pulmonic valve was normal in structure. Pulmonic valve regurgitation is trivial. No evidence of pulmonic stenosis. Aorta: Aortic dilatation noted. There is mild dilatation of the aortic root, measuring 36 mm. There is moderate dilatation of the ascending aorta, measuring 43 mm. Venous: The inferior vena cava is dilated in size with less than 50% respiratory variability, suggesting right atrial pressure of 15 mmHg. IAS/Shunts: No atrial level shunt detected by color flow Doppler. Additional Comments: There is a large pleural effusion in the left lateral region.  LEFT VENTRICLE PLAX 2D LVIDd:         5.90 cm      Diastology LVIDs:         5.00 cm      LV e' medial:    3.89 cm/s LV PW:         1.00 cm      LV E/e' medial:  31.7 LV IVS:        0.90 cm      LV e' lateral:   3.89 cm/s LVOT diam:     2.10 cm      LV E/e' lateral: 31.7 LVOT Area:     3.46 cm  LV Volumes (MOD) LV vol d, MOD A2C: 121.0 ml LV vol d, MOD A4C: 161.0 ml LV vol s, MOD A2C: 93.8 ml LV vol s, MOD A4C: 124.0 ml LV SV MOD A2C:     27.2 ml LV SV MOD A4C:     161.0 ml LV SV MOD BP:      37.7 ml RIGHT VENTRICLE            IVC RV S prime:     6.32 cm/s  IVC diam: 2.50 cm TAPSE (M-mode): 0.6 cm LEFT ATRIUM              Index        RIGHT ATRIUM           Index LA diam:         5.00 cm  2.57 cm/m   RA Area:     34.10 cm LA Vol (A2C):   144.0 ml 74.00 ml/m  RA Volume:   115.00 ml 59.09 ml/m LA Vol (A4C):   116.0 ml 59.61 ml/m LA Biplane Vol: 131.0 ml 67.32 ml/m  AORTIC VALVE AI PHT:      516 msec  AORTA Ao Root diam: 3.60 cm Ao Asc diam:  4.30 cm MITRAL VALVE                  TRICUSPID VALVE MV Area (PHT): 4.61 cm       TR Peak grad:   11.3 mmHg MV Decel Time: 165 msec       TR Mean grad:   8.0 mmHg MR Peak grad:    98.4 mmHg    TR Vmax:        168.00 cm/s MR Mean grad:    61.0 mmHg    TR Vmean:       141.0 cm/s MR Vmax:  496.00 cm/s MR Vmean:        365.0 cm/s   SHUNTS MR PISA:         1.57 cm     Systemic Diam: 2.10 cm MR PISA Eff ROA: 12 mm MR PISA Radius:  0.50 cm MV E velocity: 123.50 cm/s Chilton Si MD Electronically signed by Chilton Si MD Signature Date/Time: 03/09/2022/5:09:09 PM    Final    DG Chest 2 View  Result Date: 03/08/2022 CLINICAL DATA:  Shortness of breath. EXAM: CHEST - 2 VIEW COMPARISON:  Chest radiograph 10/06/2021 and earlier; CT angio chest 05/21/2021 FINDINGS: Stable enlarged cardiac silhouette. Postoperative changes of median sternotomy and coronary artery bypass graft. Aortic calcifications. Retrocardiac airspace opacities in the left lower lobe with small layering left pleural effusion. Subsegmental atelectasis in the right lung base. Mild diffuse hazy interstitial thickening and prominence of the hilar vasculature. No pneumothorax. Anterior wedging of a distal thoracic vertebral body is similar to prior exam. IMPRESSION: Stable cardiomegaly with mild pulmonary edema and new small left layering pleural effusion. Left lower lobe opacities likely reflect combination of pleural fluid and adjacent atelectasis, though superimposed infection cannot be excluded. Aortic Atherosclerosis (ICD10-I70.0). Electronically Signed   By: Sherron Ales M.D.   On: 03/08/2022 19:02    Cardiac Studies   Echo   03/09/22    1. Left ventricular  ejection fraction, by estimation, is 20 to 25%. The  left ventricle has severely decreased function. The left ventricle  demonstrates global hypokinesis. The left ventricular internal cavity size  was mildly to moderately dilated.  Indeterminate diastolic filling due to E-A fusion. Elevated left  ventricular end-diastolic pressure.   2. Right ventricular systolic function is normal. The right ventricular  size is normal. There is normal pulmonary artery systolic pressure.   3. Left atrial size was severely dilated.   4. Right atrial size was severely dilated.   5. A small pericardial effusion is present. There is no evidence of  cardiac tamponade. Large pleural effusion in the left lateral region.   6. The mitral valve is normal in structure. Mild to moderate mitral valve  regurgitation. No evidence of mitral stenosis.   7. The aortic valve is tricuspid. There is mild calcification of the  aortic valve. There is mild thickening of the aortic valve. Aortic valve  regurgitation is mild. No aortic stenosis is present.   8. Aortic dilatation noted. There is mild dilatation of the aortic root,  measuring 36 mm. There is moderate dilatation of the ascending aorta,  measuring 43 mm.   9. The inferior vena cava is dilated in size with <50% respiratory  variability, suggesting right atrial pressure of 15 mmHg.   Patient Profile     88 y.o. male with Hx of CAD (CABG 2015), PAF, HFrEF, MR , CVA   Admitted from clnic on 10/11 for Afib with RVR and volume overload.    Assessment & Plan    1  Atrial fibrillation  pt presented to clinic  on 10in afib with RVR.   Admitted to hosp He had been on Tikosyn 125 bid  Not clear when started     I recom stopping since in afib with RVR  It looks like he did nto tolerate amiodarone in past (walked sideways)    He has been on Eliquis    Increased metoprolol   Rates good when in bed  he does not do much his wife says   2  CAD  S/p CABG  2015 (LIMA to LAD; SVG  to OM1/2; SVG to PDA)    No CP      3  HFrEF   Echo in 2019 LVEf 35 to 40%   I have reviewed echo that was just done   LVEF is 25%  Worse.   Barton Fannytra are severely dilated   there is a large pleural effusion ? If worseing LVEF reflects afib with RVR   Not clear how long he was like this  Plan  Cotninue to diurese  Rate control He probably would do better in SR   But ? If he could be on a higher dose of tikosyn at his age, frailty   Did not toelrate amiodarone   4  MR   Moderate on echo in 2019   Mild to moderate now     5  ? COPD Continue 40 mg prednisone   Diurese    Did not get flutter valve yesterday     6  Hx CVA   Keep on Eliquis      7  Hx of DM  A1C now 5     8 Hx DVT   R femoral v in Dec 2022    9   Hx HTN  BP is OK   10  HL  Keep on Crestor    11   DNR For questions or updates, please contact [facility] Please consult [email or web address] for contact info under        Signed, Dietrich PatesPaula Shavone Nevers, MD  03/10/2022, 6:52 AM

## 2022-03-10 NOTE — Consult Note (Addendum)
NAME:  Edgar Thomas, MRN:  [id number], DOB:  1930/01/30, LOS: 2 ADMISSION DATE:  03/08/2022, CONSULTATION DATE:  03/10/2022 REFERRING MD:  Dr. Peter [place], CHIEF COMPLAINT: Shortness of breath, left-sided pleural effusion  History of Present Illness:  [name] is a 88 y.o. male, DNR, with a past medical history of CAD, HTN, HLD, CABG (2015, LIMA to LAD, SVG to OM1/2, SVG to PDA), PAF(Tikosyn, diltiazem, Eliquis) and CHF (GDMT, 40 mg Lasix BID).  Patient presented to outpatient cardiologist on 10/11  with  A-fib with RVR to 120s, shortness of breath, wheezing, and gross fluid overload with edema to mid thigh.  Outpatient cardiologist referred patient to ER, patient was subsequently admitted to cardiology service.  While admitted, patient has undergone aggressive Lasix diuresis regimen (80 mg twice daily), and is currently net -4 L since admission.  Echo performed on 10/12 demonstrated LVEF of 20 to 25% (decreased from 35-40% in 2019) with global hypokinesis of the left ventricle, and mild dilation of the left ventricle.  Small pericardial effusion was present, without tamponade physiology.  Large pleural effusion was identified in the left lateral region.  Mild calcification of the aortic valve with mild regurgitation.  Right ventricular systolic function normal, and normal pulmonary artery systolic pressure.   PCCM consulted for evaluation/management of left-sided pleural effusion.  Thank you for the consult.  Pertinent  Medical History  HFrEF CABG PAF HTN HLD GERD Chronic anticoagulation CVA  Significant Hospital Events: Including procedures, antibiotic start and stop dates in addition to other pertinent events   Admitted to 2 [place] without incident  Interim History / Subjective:  No acute events, patient has not required oxygen supplementation while admitted.  Patient has been able to put out a significant amount of urine with diuresis.   Objective   Blood pressure 126/86, pulse 90,  temperature 97.7 F (36.5 C), temperature source Oral, resp. rate 18, weight 69.8 kg, SpO2 91 %.        Intake/Output Summary (Last 24 hours) at 03/10/2022 1058 Last data filed at 03/10/2022 0600 Gross per 24 hour  Intake 60 ml  Output 3300 ml  Net -3240 ml   Filed Weights   03/09/22 0156 03/10/22 0600  Weight: 76.9 kg 69.8 kg    Examination: General: Hyperactively delirious, thin man lying in hospital bed, no acute distress.  HENT: JVP present Lungs: No signs of respiratory distress, or accessory muscle use.  Saturating at 100% on room air.  Diminished breath sounds in bilateral lower lung fields (L>R).  Bedside ultrasound demonstrated approximately 400-500 mL pleural effusion and left pleural space.  Fluid collection was gravity dependent, and the distal edge of the left lower lung lobe was visualized within the pleural effusion. Cardiovascular: Irregularly irregular, rate controlled on bedside telemetry Abdomen: Soft, nontender, nondistended Extremities: 1-2+ pitting edema in bilateral lower extremities to distal knees.  Independently moves all extremities, strength 5 out of 5 Neuro: Oriented to person, but not place/time.  Cannot orient himself to day of the week, held the believe that he was in a hotel, and was perseverating on the current time.   Resolved Hospital Problem list     Assessment & Plan:  Left lobe pleural effusion: Large portion of patient history provided by wife at bedside. Last dose of elliquis was today. Patient has been afebrile, without productive cough, oxygen desaturation, chills, chest pain.on exam, patient saturating at 100% on room air and not in respiratory distress.  Patient able to speak in full sentences, and  did not did not demonstrate any increased work of breathing.  Bedside ultrasound utilized to visualize pleural effusion, and as noted above, there is approximately 400 to 500 mL of fluid in the left pleural lung space. No loculation appreciated  within pleural fluid.  Distal edge of the left lower lung lobe was visualized within the pleural effusion.  Although left sided in nature, low concern for empyema given patients symptomatology and lack of systemic signs of illness.   Risks/benefits of thoracentesis was explained to wife at bedside and she was adamant that patient would not want  this procedure. Patient also did want any additional procedure.  A thoracentesis would be technically difficult given  patient's hyperactive delirium, decreased attention span, diminished orientation to surroundings,  and consistency with anticoagulation. Given low concern for empyema, patient's productive response to scheduled IV lasix, and importantly the family's desire not to do a thoracentesis, we will not proceed with thoracentesis today. Plan is as follows:   -Continue IV Lasix regimen per Cardiology team with monitoring of effusion -No immediate needs for thoracentesis today -Follow Pro-calcitonin -PCCM will sign off for now, but please reach out if patient's clinical status changes or effusion is persistent despite diuresis. Thank you for the consult.    Labs   CBC: Recent Labs  Lab 03/08/22 1825  WBC 6.6  HGB 14.7  HCT 43.1  MCV 98.2  PLT 129*    Basic Metabolic Panel: Recent Labs  Lab 03/08/22 1825 03/09/22 0043 03/10/22 0131  NA 139 138 141  K 3.9 3.8 4.3  CL 105 105 105  CO2 25 28 28   GLUCOSE 92 90 128*  BUN 21 24* 23  CREATININE 1.12 1.18 1.19  CALCIUM 8.9 8.5* 9.0  MG 2.2 2.1  --    GFR: Estimated Creatinine Clearance: 40.7 mL/min (by C-G formula based on SCr of 1.19 mg/dL). Recent Labs  Lab 03/08/22 1825  WBC 6.6    Liver Function Tests: No results for input(s): "AST", "ALT", "ALKPHOS", "BILITOT", "PROT", "ALBUMIN" in the last 168 hours. No results for input(s): "LIPASE", "AMYLASE" in the last 168 hours. No results for input(s): "AMMONIA" in the last 168 hours.  ABG    Component Value Date/Time   PHART  7.341 (L) 12/03/2013 2134   PCO2ART 41.2 12/03/2013 2134   PO2ART 67.0 (L) 12/03/2013 2134   HCO3 22.3 12/03/2013 2134   TCO2 22 08/28/2015 2134   ACIDBASEDEF 3.0 (H) 12/03/2013 2134   O2SAT 92.0 12/03/2013 2134     Coagulation Profile: No results for input(s): "INR", "PROTIME" in the last 168 hours.  Cardiac Enzymes: No results for input(s): "CKTOTAL", "CKMB", "CKMBINDEX", "TROPONINI" in the last 168 hours.  HbA1C: Hgb A1c MFr Bld  Date/Time Value Ref Range Status  03/08/2022 06:35 PM 5.0 4.8 - 5.6 % Final    Comment:    (NOTE) Pre diabetes:          5.7%-6.4%  Diabetes:              >6.4%  Glycemic control for   <7.0% adults with diabetes   12/01/2013 02:10 PM 5.8 (H) <5.7 % Final    Comment:    (NOTE)  According to the ADA Clinical Practice Recommendations for 2011, when HbA1c is used as a screening test:  >=6.5%   Diagnostic of Diabetes Mellitus           (if abnormal result is confirmed) 5.7-6.4%   Increased risk of developing Diabetes Mellitus References:Diagnosis and Classification of Diabetes Mellitus,Diabetes Care,2011,34(Suppl 1):S62-S69 and Standards of Medical Care in         Diabetes - 2011,Diabetes Care,2011,34 (Suppl 1):S11-S61.    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   Surgical History:   Past Surgical History:  Procedure Laterality Date   balloon angioplasty of LAD      CARDIOVERSION  2018   a fib   CATARACT EXTRACTION, BILATERAL  1019, 2022   COLONOSCOPY     CORONARY ARTERY BYPASS GRAFT N/A 12/03/2013   Procedure: CORONARY ARTERY BYPASS GRAFTING (CABG) x4 using left internal mammary artery and right greater saphenous vein. LIMA to LAD, sequential SVG to OM 1 & OM 2, SVG to PD;  Surgeon: Delight Ovens, MD;  Location: [facility] OR;  Service: Open Heart Surgery;  Laterality: N/A;   ENDOVEIN HARVEST OF GREATER SAPHENOUS VEIN Right 12/03/2013   Procedure: ENDOVEIN HARVEST OF GREATER SAPHENOUS VEIN;  Surgeon: Delight Ovens, MD;  Location: [facility] OR;  Service: Open Heart Surgery;  Laterality: Right;   INTRAOPERATIVE TRANSESOPHAGEAL ECHOCARDIOGRAM N/A 12/03/2013   Procedure: INTRAOPERATIVE TRANSESOPHAGEAL ECHOCARDIOGRAM;  Surgeon: Delight Ovens, MD;  Location: [facility] OR;  Service: Open Heart Surgery;  Laterality: N/A;     Social History:   reports that he has never smoked. He has never used smokeless tobacco. He reports that he does not drink alcohol and does not use drugs.   Family History:  His family history includes Heart disease in his brother and mother; Hypertension in his brother; Leukemia in his sister.   Allergies Allergies  Allergen Reactions   Contrast Media [Iodinated Contrast Media] Rash    [facility] years ago at [facility]   Metrizamide Rash    19 years ago at [facility] (Am[place])   Other Other (See Comments)    Patient preference: NO MEAT OR DAIRY!!   Amiodarone Other (See Comments)    Makes the patient walk sideways and he falls   Ciprofloxacin Other (See Comments)    Was told by MD to "not take"   Tramadol Nausea And Vomiting     Home Medications  Prior to Admission medications   Medication Sig Start Date End Date Taking? Authorizing Provider  apixaban (ELIQUIS) 5 MG TABS tablet Take 5 mg by mouth 2 (two) times daily.   Yes [provider]  Cholecalciferol (VITAMIN D-3) 125 MCG (5000 UT) TABS Take 1 capsule by  mouth daily after breakfast.   Yes [provider]  CRESTOR 20 MG tablet Take 20 mg by mouth every morning.  08/26/14  Yes [provider]  dofetilide (TIKOSYN) 125 MCG capsule Take 125 mcg by mouth 2 (two) times daily.   Yes [provider]  ferrous sulfate 325 (65  FE) MG tablet Take 325 mg by mouth daily with breakfast.   Yes [provider]  furosemide (LASIX) 40 MG tablet Take 40 mg by mouth 2 (two) times daily.   Yes [provider]  JARDIANCE 10 MG TABS tablet Take 10 mg by mouth every morning. 05/10/21  Yes [provider]  levothyroxine (SYNTHROID) 50 MCG tablet Take 50 mcg by mouth daily. 02/08/22  Yes [provider]  metoprolol tartrate (LOPRESSOR) 25 MG tablet Take 1 & 1/2 tablets (37.5 mg total) by mouth 2 (two) times daily. Patient taking differently: Take 12.5 mg by mouth 2 (two) times daily. 05/19/21 03/08/22 Yes Amin, Loura Halt, MD  Multiple Vitamins-Minerals (ONE-A-DAY MENS 50+ ADVANTAGE) TABS Take 1 tablet by mouth daily with breakfast.    Yes [provider]  nitroGLYCERIN (NITROSTAT) 0.4 MG SL tablet Place 0.4 mg under the tongue every 5 (five) minutes as needed for chest pain.   Yes [provider]  potassium chloride (KLOR-CON) 10 MEQ tablet Take 10 mEq by mouth daily. 01/04/22  Yes [provider]  testosterone cypionate (DEPOTESTOSTERONE CYPIONATE) 200 MG/ML injection Inject 200 mg into the muscle every 28 (twenty-eight) days. Taking every 4 weeks 02/17/22  Yes [provider]  Testosterone Cypionate 200 MG/ML SOLN Inject as directed. Taking every 4 weeks   Yes [provider]  TIADYLT ER 120 MG 24 hr capsule Take 120 mg by mouth every morning. 02/08/22  Yes [provider]  cefdinir (OMNICEF) 300 MG capsule Take 1 capsule (300 mg total) by mouth 2 (two) times daily. Patient not taking: Reported on 03/08/2022 10/08/21   Gwyneth Sprout, MD  diltiazem (CARDIZEM  CD) 120 MG 24 hr capsule Take 120 mg by mouth every morning. Patient not taking: Reported on 03/08/2022 05/10/21   [provider]  ondansetron (ZOFRAN) 4 MG tablet Take 1 tablet (4 mg total) by mouth every 6 (six) hours. Patient not taking: Reported on 03/08/2022 10/07/21   Gwyneth Sprout, MD  torsemide (DEMADEX) 20 MG tablet Take 2 tablets (40 mg total) by mouth 2 (two) times daily. 03/08/22   Wendall Stade, MD     Critical care time:

## 2022-03-11 ENCOUNTER — Inpatient Hospital Stay (HOSPITAL_COMMUNITY): Payer: Medicare Other

## 2022-03-11 LAB — BASIC METABOLIC PANEL
Anion gap: 10 (ref 5–15)
BUN: 31 mg/dL — ABNORMAL HIGH (ref 8–23)
CO2: 28 mmol/L (ref 22–32)
Calcium: 8.7 mg/dL — ABNORMAL LOW (ref 8.9–10.3)
Chloride: 101 mmol/L (ref 98–111)
Creatinine, Ser: 1.19 mg/dL (ref 0.61–1.24)
GFR, Estimated: 58 mL/min — ABNORMAL LOW (ref >60)
Glucose, Bld: 136 mg/dL — ABNORMAL HIGH (ref 70–99)
Potassium: 3.3 mmol/L — ABNORMAL LOW (ref 3.5–5.1)
Sodium: 139 mmol/L (ref 135–145)

## 2022-03-11 LAB — GLUCOSE, CAPILLARY
Glucose-Capillary: 128 mg/dL — ABNORMAL HIGH (ref 70–99)
Glucose-Capillary: 132 mg/dL — ABNORMAL HIGH (ref 70–99)
Glucose-Capillary: 137 mg/dL — ABNORMAL HIGH (ref 70–99)
Glucose-Capillary: 143 mg/dL — ABNORMAL HIGH (ref 70–99)

## 2022-03-11 NOTE — Progress Notes (Signed)
Rounding Note    Patient Name: Edgar Thomas Date of Encounter: 03/11/2022  [facility] Cardiologist: Charlton Haws, MD   Subjective   Looks better this am   Says breathing better   Denies CP   Inpatient Medications    Scheduled Meds:  apixaban  5 mg Oral BID   empagliflozin  10 mg Oral Daily   furosemide  80 mg Intravenous BID   insulin aspart  0-6 Units Subcutaneous TID WC   levothyroxine  50 mcg Oral Q0600   metoprolol tartrate  50 mg Oral BID   predniSONE  40 mg Oral Daily   rosuvastatin  20 mg Oral Daily   sodium chloride flush  3 mL Intravenous Q12H   Continuous Infusions:  sodium chloride     PRN Meds: sodium chloride, acetaminophen, sodium chloride flush   Vital Signs    Vitals:   03/10/22 1323 03/10/22 1545 03/10/22 1934 03/11/22 0600  BP: (!) 125/90 115/81 (!) 129/101 (!) 130/93  Pulse: 97 96 90 85  Resp: 18 18 16 18   Temp: (!) 97.2 F (36.2 C) (!) 97.5 F (36.4 C) (!) 97.2 F (36.2 C) (!) 97.3 F (36.3 C)  TempSrc: Oral Oral Oral Oral  SpO2: 94% (!) 18% 95% 93%  Weight:        Intake/Output Summary (Last 24 hours) at 03/11/2022 0637 Last data filed at 03/11/2022 0557 Gross per 24 hour  Intake 360 ml  Output 3050 ml  Net -2690 ml      03/10/2022    6:00 AM 03/09/2022    1:56 AM 03/08/2022    3:06 PM  Last 3 Weights  Weight (lbs) 153 lb 14.1 oz 169 lb 9.6 oz 163 lb  Weight (kg) 69.8 kg 76.93 kg 73.936 kg      Telemetry    Afib  80s   - Personally Reviewed  ECG    No new  - Personally Reviewed  Physical Exam   GEN: Thin 88 yo in no acute distress.   Neck: JVP is increased  Cardiac: Irreg irreg  II/VI systolic murmur base  Respiratory:Moving air better   No rales  no rhonchi GI: Soft, nontender, non-distended  MS: 1+ edema at ankles   No edema above   has TED hose on ;Neuro:  Nonfocal  Psych: Normal affect   Labs    High Sensitivity Troponin:  No results for input(s): "TROPONINIHS" in the last 720 hours.    Chemistry Recent Labs  Lab 03/08/22 1825 03/09/22 0043 03/10/22 0131 03/10/22 1550  NA 139 138 141 138  K 3.9 3.8 4.3 4.0  CL 105 105 105 104  CO2 25 28 28 25   GLUCOSE 92 90 128* 177*  BUN 21 24* 23 30*  CREATININE 1.12 1.18 1.19 1.28*  CALCIUM 8.9 8.5* 9.0 8.8*  MG 2.2 2.1  --   --   GFRNONAA >60 59* 58* 53*  ANIONGAP 9 5 8 9     Lipids No results for input(s): "CHOL", "TRIG", "HDL", "LABVLDL", "LDLCALC", "CHOLHDL" in the last 168 hours.  Hematology Recent Labs  Lab 03/08/22 1825  WBC 6.6  RBC 4.39  HGB 14.7  HCT 43.1  MCV 98.2  MCH 33.5  MCHC 34.1  RDW 16.0*  PLT 129*   Thyroid No results for input(s): "TSH", "FREET4" in the last 168 hours.  BNP Recent Labs  Lab 03/08/22 1825 03/10/22 1550  BNP 911.3* 1,431.0*     Net Neg 4.1 L   DDimer  No results for input(s): "DDIMER" in the last 168 hours.   Radiology    ECHOCARDIOGRAM COMPLETE  Result Date: 03/09/2022    ECHOCARDIOGRAM REPORT   Patient Name:   Edgar Thomas Date of Exam: 03/09/2022 Medical Rec #:  [id number]    Height:       70.0 in Accession #:    [id number]   Weight:       169.6 lb Date of Birth:  14-Jun-1929     BSA:          1.946 m Patient Age:    89 years     BP:           124/107 mmHg Patient Gender: M            HR:           117 bpm. Exam Location:  Inpatient Procedure: 2D Echo, Cardiac Doppler and Color Doppler Indications:    CHF - acute diastolic  History:        Patient has prior history of Echocardiogram examinations, most                 recent 08/15/2017. CHF, CAD, Prior CABG, Stroke,                 Arrythmias:Atrial Fibrillation; Risk Factors:Hypertension and                 Dyslipidemia.  Sonographer:    Milda Smart Referring Phys: [id number] Cyndi Bender  Sonographer Comments: Image acquisition challenging due to patient body habitus and Image acquisition challenging due to respiratory motion. IMPRESSIONS  1. Left ventricular ejection fraction, by estimation, is 20 to 25%. The left ventricle has  severely decreased function. The left ventricle demonstrates global hypokinesis. The left ventricular internal cavity size was mildly to moderately dilated. Indeterminate diastolic filling due to E-A fusion. Elevated left ventricular end-diastolic pressure.  2. Right ventricular systolic function is normal. The right ventricular size is normal. There is normal pulmonary artery systolic pressure.  3. Left atrial size was severely dilated.  4. Right atrial size was severely dilated.  5. A small pericardial effusion is present. There is no evidence of cardiac tamponade. Large pleural effusion in the left lateral region.  6. The mitral valve is normal in structure. Mild to moderate mitral valve regurgitation. No evidence of mitral stenosis.  7. The aortic valve is tricuspid. There is mild calcification of the aortic valve. There is mild thickening of the aortic valve. Aortic valve regurgitation is mild. No aortic stenosis is present.  8. Aortic dilatation noted. There is mild dilatation of the aortic root, measuring 36 mm. There is moderate dilatation of the ascending aorta, measuring 43 mm.  9. The inferior vena cava is dilated in size with <50% respiratory variability, suggesting right atrial pressure of 15 mmHg. FINDINGS  Left Ventricle: Left ventricular ejection fraction, by estimation, is 20 to 25%. The left ventricle has severely decreased function. The left ventricle demonstrates global hypokinesis. The left ventricular internal cavity size was mildly to moderately dilated. There is no left ventricular hypertrophy. Indeterminate diastolic filling due to E-A fusion. Elevated left ventricular end-diastolic pressure. Right Ventricle: The right ventricular size is normal. No increase in right ventricular wall thickness. Right ventricular systolic function is normal. There is normal pulmonary artery systolic pressure. The tricuspid regurgitant velocity is 1.68 m/s, and  with an assumed right atrial pressure of 15  mmHg, the estimated right ventricular systolic pressure is 26.3 mmHg. Left Atrium: Left  atrial size was severely dilated. Right Atrium: Right atrial size was severely dilated. Pericardium: A small pericardial effusion is present. There is no evidence of cardiac tamponade. Mitral Valve: The mitral valve is normal in structure. Mild to moderate mitral valve regurgitation. No evidence of mitral valve stenosis. Tricuspid Valve: The tricuspid valve is normal in structure. Tricuspid valve regurgitation is mild . No evidence of tricuspid stenosis. Aortic Valve: The aortic valve is tricuspid. There is mild calcification of the aortic valve. There is mild thickening of the aortic valve. Aortic valve regurgitation is mild. Aortic regurgitation PHT measures 516 msec. No aortic stenosis is present. Pulmonic Valve: The pulmonic valve was normal in structure. Pulmonic valve regurgitation is trivial. No evidence of pulmonic stenosis. Aorta: Aortic dilatation noted. There is mild dilatation of the aortic root, measuring 36 mm. There is moderate dilatation of the ascending aorta, measuring 43 mm. Venous: The inferior vena cava is dilated in size with less than 50% respiratory variability, suggesting right atrial pressure of 15 mmHg. IAS/Shunts: No atrial level shunt detected by color flow Doppler. Additional Comments: There is a large pleural effusion in the left lateral region.  LEFT VENTRICLE PLAX 2D LVIDd:         5.90 cm      Diastology LVIDs:         5.00 cm      LV e' medial:    3.89 cm/s LV PW:         1.00 cm      LV E/e' medial:  31.7 LV IVS:        0.90 cm      LV e' lateral:   3.89 cm/s LVOT diam:     2.10 cm      LV E/e' lateral: 31.7 LVOT Area:     3.46 cm  LV Volumes (MOD) LV vol d, MOD A2C: 121.0 ml LV vol d, MOD A4C: 161.0 ml LV vol s, MOD A2C: 93.8 ml LV vol s, MOD A4C: 124.0 ml LV SV MOD A2C:     27.2 ml LV SV MOD A4C:     161.0 ml LV SV MOD BP:      37.7 ml RIGHT VENTRICLE            IVC RV S prime:     6.32 cm/s   IVC diam: 2.50 cm TAPSE (M-mode): 0.6 cm LEFT ATRIUM              Index        RIGHT ATRIUM           Index LA diam:        5.00 cm  2.57 cm/m   RA Area:     34.10 cm LA Vol (A2C):   144.0 ml 74.00 ml/m  RA Volume:   115.00 ml 59.09 ml/m LA Vol (A4C):   116.0 ml 59.61 ml/m LA Biplane Vol: 131.0 ml 67.32 ml/m  AORTIC VALVE AI PHT:      516 msec  AORTA Ao Root diam: 3.60 cm Ao Asc diam:  4.30 cm MITRAL VALVE                  TRICUSPID VALVE MV Area (PHT): 4.61 cm       TR Peak grad:   11.3 mmHg MV Decel Time: 165 msec       TR Mean grad:   8.0 mmHg MR Peak grad:    98.4 mmHg    TR Vmax:  168.00 cm/s MR Mean grad:    61.0 mmHg    TR Vmean:       141.0 cm/s MR Vmax:         496.00 cm/s MR Vmean:        365.0 cm/s   SHUNTS MR PISA:         1.57 cm     Systemic Diam: 2.10 cm MR PISA Eff ROA: 12 mm MR PISA Radius:  0.50 cm MV E velocity: 123.50 cm/s Chilton Siiffany [place] MD Electronically signed by Chilton Siiffany [place] MD Signature Date/Time: 03/09/2022/5:09:09 PM    Final     Cardiac Studies   Echo   03/09/22    1. Left ventricular ejection fraction, by estimation, is 20 to 25%. The  left ventricle has severely decreased function. The left ventricle  demonstrates global hypokinesis. The left ventricular internal cavity size  was mildly to moderately dilated.  Indeterminate diastolic filling due to E-A fusion. Elevated left  ventricular end-diastolic pressure.   2. Right ventricular systolic function is normal. The right ventricular  size is normal. There is normal pulmonary artery systolic pressure.   3. Left atrial size was severely dilated.   4. Right atrial size was severely dilated.   5. A small pericardial effusion is present. There is no evidence of  cardiac tamponade. Large pleural effusion in the left lateral region.   6. The mitral valve is normal in structure. Mild to moderate mitral valve  regurgitation. No evidence of mitral stenosis.   7. The aortic valve is tricuspid. There is  mild calcification of the  aortic valve. There is mild thickening of the aortic valve. Aortic valve  regurgitation is mild. No aortic stenosis is present.   8. Aortic dilatation noted. There is mild dilatation of the aortic root,  measuring 36 mm. There is moderate dilatation of the ascending aorta,  measuring 43 mm.   9. The inferior vena cava is dilated in size with <50% respiratory  variability, suggesting right atrial pressure of 15 mmHg.   Patient Profile     88 y.o. male with Hx of CAD (CABG 2015), PAF, HFrEF, MR , CVA   Admitted from clnic on 10/11 for Afib with RVR and volume overload.    Assessment & Plan    1  Atrial fibrillation  pt presented to clinic  on 10in afib with RVR.   Admitted to hosp He had been on Tikosyn 125 bid     I recom stopping since in afib with RVR  It looks like he did nto tolerate amiodarone in past (walked sideways)   No other medicine options   Plan for rate control  Metoprolol increased   Control is fair   he does not do that much   He has been on Eliquis   Continue   2  CAD  S/p CABG 2015 (LIMA to LAD; SVG to OM1/2; SVG to PDA)      Pt has not had CP      3  HFrEF   Echo in 2019 LVEf 35 to 40%   Echo this admit  LVEF is 25%  Worse.   Atria are severely dilated   There is a large pleural effusion (too small to tap) ? If worseing LVEF reflects afib with RVR   Would do better in SR but there is no good medical choice  He has improved signficantly from admit   Labs pending today  Breathing is much better AM labs pending  May give additional IV    Getting closer to d/c     At home needs to weigh regularly      4  MR   Moderate on echo in 2019   Mild to moderate now     5  ? COPD  Will stop prednisone    has flutter valve   6  Hx CVA   Keep on Eliquis      7  Hx of DM  A1C now 5     8 Hx DVT   R femoral v in Dec 2022  9   Hx HTN  BP is OK   10  HL  Keep on Crestor    11   DNR For questions or updates, please contact [facility] Please consult [email or web address] for contact info under        Signed, Dietrich Pates, MD  03/11/2022, 6:37 AM

## 2022-03-11 NOTE — Progress Notes (Signed)
Mobility Specialist - Progress Note   03/11/22 1000  Mobility  Activity Stood at bedside (Sit to Stand x1)  Level of Assistance Minimal assist, patient does 75% or more  Assistive Device Front wheel walker  Distance Ambulated (ft) 0 ft  Activity Response Tolerated well  $Mobility charge 1 Mobility    Pt received in bed agreeable to mobility. Left in bed w/ call bell in reach and all needs met.   Paulla Dolly Mobility Specialist

## 2022-03-12 LAB — GLUCOSE, CAPILLARY
Glucose-Capillary: 110 mg/dL — ABNORMAL HIGH (ref 70–99)
Glucose-Capillary: 97 mg/dL (ref 70–99)

## 2022-03-12 LAB — BASIC METABOLIC PANEL
Anion gap: 10 (ref 5–15)
BUN: 33 mg/dL — ABNORMAL HIGH (ref 8–23)
CO2: 27 mmol/L (ref 22–32)
Calcium: 8.4 mg/dL — ABNORMAL LOW (ref 8.9–10.3)
Chloride: 99 mmol/L (ref 98–111)
Creatinine, Ser: 1.17 mg/dL (ref 0.61–1.24)
GFR, Estimated: 59 mL/min — ABNORMAL LOW (ref >60)
Glucose, Bld: 100 mg/dL — ABNORMAL HIGH (ref 70–99)
Potassium: 4.1 mmol/L (ref 3.5–5.1)
Sodium: 136 mmol/L (ref 135–145)

## 2022-03-12 MED ORDER — POTASSIUM CHLORIDE ER 10 MEQ PO TBCR: 10.0000 meq | EXTENDED_RELEASE_TABLET | Freq: Every day | ORAL | 6 refills | Status: DC

## 2022-03-12 MED ORDER — METOPROLOL TARTRATE 50 MG PO TABS: 50.0000 mg | ORAL_TABLET | Freq: Two times a day (BID) | ORAL | 3 refills | Status: DC

## 2022-03-12 MED ORDER — FUROSEMIDE 80 MG PO TABS: 80.0000 mg | ORAL_TABLET | Freq: Two times a day (BID) | ORAL | 3 refills | Status: DC

## 2022-03-12 NOTE — Discharge Instructions (Signed)
   Heart Monitor:   Length of Wear: 7 days   Your monitor will be mailed to your home address within 3-5 business days. However, if you have not received your monitor after 5 business days please send us a MyChart message or call the office at [phone], so we may follow up on this for you.    Your physician has recommended that you wear a Zio AT monitor.    This monitor is a medical device that records the heart's electrical activity. Doctors most often use these monitors to diagnose arrhythmias. Arrhythmias are problems with the speed or rhythm of the heartbeat. The monitor is a small device applied to your chest. You can wear one while you do your normal daily activities. While wearing this monitor if you have any symptoms to push the button and record what you felt. Once you have worn this monitor for the period of time provider prescribed (Usually 14 days), you will return the monitor device in the postage paid box. Once it is returned they will download the data collected and provide us with a report which the provider will then review and we will call you with those results. Important tips:   1. Avoid showering during the first 24 hours of wearing the monitor. 2. Avoid excessive sweating to help maximize wear time. 3. Do not submerge the device, no hot tubs, and no swimming pools. 4. Keep any lotions or oils away from the patch. 5. After 24 hours you may shower with the patch on. Take brief showers with your back facing the shower head.  6. Do not remove patch once it has been placed because that will interrupt data and decrease adhesive wear time. 7. Push the button when you have any symptoms and write down what you were feeling. 8. Once you have completed wearing your monitor, remove and place into box which has postage paid and place in your outgoing mailbox.  9. If for some reason you have misplaced your box then call our office and we can provide another box and/or mail it off  for you.  

## 2022-03-12 NOTE — Progress Notes (Signed)
Mobility Specialist - Progress Note   03/12/22 0950  Mobility  Activity Ambulated with assistance in room  Level of Assistance Minimal assist, patient does 75% or more  Assistive Device Front wheel walker  Distance Ambulated (ft) 20 ft  Activity Response Tolerated well  $Mobility charge 1 Mobility    Pt received in bed agreeable to mobility. Left in bed w/ call bel in reach and all needs met.   Paulla Dolly Mobility Specialist

## 2022-03-12 NOTE — Discharge Summary (Signed)
Discharge Summary    Patient ID: Edgar Thomas MRN: [id number]; DOB: [birth year]  Admit date: 03/08/2022 Discharge date: 03/12/2022  PCP:  Simone Curia, MD   [facility] Providers Cardiologist:  Charlton Haws, MD   Discharge Diagnoses    Principal Problem:   CHF (congestive heart failure) [facility]) Active Problems:   Pleural effusion on left   Acute HFrEF (heart failure with reduced ejection fraction) (HCC)   Delirium    Diagnostic Studies/Procedures    Echo 03/10/22  1. Left ventricular ejection fraction, by estimation, is 20 to 25%. The  left ventricle has severely decreased function. The left ventricle  demonstrates global hypokinesis. The left ventricular internal cavity size  was mildly to moderately dilated.  Indeterminate diastolic filling due to E-A fusion. Elevated left  ventricular end-diastolic pressure.   2. Right ventricular systolic function is normal. The right ventricular  size is normal. There is normal pulmonary artery systolic pressure.   3. Left atrial size was severely dilated.   4. Right atrial size was severely dilated.   5. A small pericardial effusion is present. There is no evidence of  cardiac tamponade. Large pleural effusion in the left lateral region.   6. The mitral valve is normal in structure. Mild to moderate mitral valve  regurgitation. No evidence of mitral stenosis.   7. The aortic valve is tricuspid. There is mild calcification of the  aortic valve. There is mild thickening of the aortic valve. Aortic valve  regurgitation is mild. No aortic stenosis is present.   8. Aortic dilatation noted. There is mild dilatation of the aortic root,  measuring 36 mm. There is moderate dilatation of the ascending aorta,  measuring 43 mm.   9. The inferior vena cava is dilated in size with <50% respiratory  variability, suggesting right atrial pressure of 15 mmHg.    _____________   History of Present Illness     Edgar Thomas is a 88 y.o.  male with history of CAD s/p CABG, PAF, MR, CVA, DVT, and chronic systolic heart failure.  He has a history of CABG in 2015 with LIMA-LAD, sequential SVG-OM1-OM 2, SVG-PDA.  He has a history of systolic heart failure with last echo in 2019 in [place] showing LVEF 35-40% with moderate MR.  He was admitted 05/20/2021 with metabolic encephalopathy found to have influenza and right femoral DVT.  Given prior history of stroke he was on Eliquis and oncology did not recommend changing DOAC.  PAF has been managed with Tikosyn.  He has also been maintained on beta-blocker and Lasix therapy.  Apparently ARB was stopped.  He does have an allergy to amiodarone (walked sideways with falls).  He was seen in the clinic 03/08/2022 in rapid A-fib with ventricular rate in the 120s and gross volume overload with edema to mid thighs.  He does have a component of lymphedema.  Decision was made for hospital admission for IV diuresis.  He has DNR status.  Dr. Admission noted it was unclear how much heart failure was contributing to his hypoxia in the clinic.  Question component of COPD.  Pt wife is 63 years younger and is very attentive to pt needs.  Hospital Course     Consultants: None  Acute on chronic systolic heart failure LVEF previously 35-40%. Echocardiogram this admission with an LVEF 20-25% with normal RV, severe biatrial enlargement, small pericardial effusion without tamponade, mild to moderate MR Stop cardizem Continue lopressor for rate Continue jardiance Question if tachy-mediated   A-fib  RVR PAF Previously on Tikosyn (unclear when started), now off since he presented in Afib RVR.  He did not tolerate amiodarone in the past. Has been maintained on Cardizem and Lopressor. Stop cardizem given his EF and increased lopressor.  Seems options for restoration of rhythm are limited given CHF and CAD. Will pursue rate control.   Need for chronic anticoagulation For stroke prophylaxis in the setting of  A-fib Continue Eliquis, hx of stroke   CAD s/p CABG Continue BB, statin   Moderate MR  On echo - follow clinically   Right femoral DVT 05/22/2021 Appears unprovoked Continue Eliquis   Pt seen and examined by Dr. Tenny Craw and deemed stable for discharge.    Did the patient have an acute coronary syndrome (MI, NSTEMI, STEMI, etc) this admission?:  No                               Did the patient have a percutaneous coronary intervention (stent / angioplasty)?:  No.        The patient will be scheduled for a TOC follow up appointment in 7-10 days.  A message has been sent to the [facility] and Scheduling Pool at the office where the patient should be seen for follow up.  _____________  Discharge Vitals Blood pressure 103/62, pulse 90, temperature 98 F (36.7 C), temperature source Oral, resp. rate 18, weight 68.4 kg, SpO2 96 %.  Filed Weights   03/09/22 0156 03/10/22 0600 03/12/22 0248  Weight: 76.9 kg 69.8 kg 68.4 kg    Labs & Radiologic Studies    CBC No results for input(s): "WBC", "NEUTROABS", "HGB", "HCT", "MCV", "PLT" in the last 72 hours. Basic Metabolic Panel Recent Labs    40/98/11 0941 03/12/22 0808  NA 139 136  K 3.3* 4.1  CL 101 99  CO2 28 27  GLUCOSE 136* 100*  BUN 31* 33*  CREATININE 1.19 1.17  CALCIUM 8.7* 8.4*   Liver Function Tests No results for input(s): "AST", "ALT", "ALKPHOS", "BILITOT", "PROT", "ALBUMIN" in the last 72 hours. No results for input(s): "LIPASE", "AMYLASE" in the last 72 hours. High Sensitivity Troponin:   No results for input(s): "TROPONINIHS" in the last 720 hours.  BNP Invalid input(s): "POCBNP" D-Dimer No results for input(s): "DDIMER" in the last 72 hours. Hemoglobin A1C No results for input(s): "HGBA1C" in the last 72 hours. Fasting Lipid Panel No results for input(s): "CHOL", "HDL", "LDLCALC", "TRIG", "CHOLHDL", "LDLDIRECT" in the last 72 hours. Thyroid Function Tests No results for input(s): "TSH", "T4TOTAL",  "T3FREE", "THYROIDAB" in the last 72 hours.  Invalid input(s): "FREET3" _____________  DG Chest Port 1 View  Result Date: 03/11/2022 CLINICAL DATA:  Pleural effusion EXAM: PORTABLE CHEST 1 VIEW COMPARISON:  03/08/2022 FINDINGS: Stable cardiomegaly status post sternotomy and CABG. Aortic atherosclerosis. Similar small left pleural effusion with associated left basilar opacity chronic bilateral interstitial prominence. No pneumothorax. IMPRESSION: Unchanged small left pleural effusion with associated left basilar opacity. Electronically Signed   By: Duanne Guess D.O.   On: 03/11/2022 09:43   ECHOCARDIOGRAM COMPLETE  Result Date: 03/09/2022    ECHOCARDIOGRAM REPORT   Patient Name:   THADDAEUS GRANJA Date of Exam: 03/09/2022 Medical Rec #:  [id number]    Height:       70.0 in Accession #:    [id number]   Weight:       169.6 lb Date of Birth:  Mar 21, 1930     BSA:  1.946 m Patient Age:    88 years     BP:           124/107 mmHg Patient Gender: M            HR:           117 bpm. Exam Location:  Inpatient Procedure: 2D Echo, Cardiac Doppler and Color Doppler Indications:    CHF - acute diastolic  History:        Patient has prior history of Echocardiogram examinations, most                 recent 08/15/2017. CHF, CAD, Prior CABG, Stroke,                 Arrythmias:Atrial Fibrillation; Risk Factors:Hypertension and                 Dyslipidemia.  Sonographer:    Milda Smart Referring Phys: [id number] Cyndi Bender  Sonographer Comments: Image acquisition challenging due to patient body habitus and Image acquisition challenging due to respiratory motion. IMPRESSIONS  1. Left ventricular ejection fraction, by estimation, is 20 to 25%. The left ventricle has severely decreased function. The left ventricle demonstrates global hypokinesis. The left ventricular internal cavity size was mildly to moderately dilated. Indeterminate diastolic filling due to E-A fusion. Elevated left ventricular end-diastolic pressure.   2. Right ventricular systolic function is normal. The right ventricular size is normal. There is normal pulmonary artery systolic pressure.  3. Left atrial size was severely dilated.  4. Right atrial size was severely dilated.  5. A small pericardial effusion is present. There is no evidence of cardiac tamponade. Large pleural effusion in the left lateral region.  6. The mitral valve is normal in structure. Mild to moderate mitral valve regurgitation. No evidence of mitral stenosis.  7. The aortic valve is tricuspid. There is mild calcification of the aortic valve. There is mild thickening of the aortic valve. Aortic valve regurgitation is mild. No aortic stenosis is present.  8. Aortic dilatation noted. There is mild dilatation of the aortic root, measuring 36 mm. There is moderate dilatation of the ascending aorta, measuring 43 mm.  9. The inferior vena cava is dilated in size with <50% respiratory variability, suggesting right atrial pressure of 15 mmHg. FINDINGS  Left Ventricle: Left ventricular ejection fraction, by estimation, is 20 to 25%. The left ventricle has severely decreased function. The left ventricle demonstrates global hypokinesis. The left ventricular internal cavity size was mildly to moderately dilated. There is no left ventricular hypertrophy. Indeterminate diastolic filling due to E-A fusion. Elevated left ventricular end-diastolic pressure. Right Ventricle: The right ventricular size is normal. No increase in right ventricular wall thickness. Right ventricular systolic function is normal. There is normal pulmonary artery systolic pressure. The tricuspid regurgitant velocity is 1.68 m/s, and  with an assumed right atrial pressure of 15 mmHg, the estimated right ventricular systolic pressure is 26.3 mmHg. Left Atrium: Left atrial size was severely dilated. Right Atrium: Right atrial size was severely dilated. Pericardium: A small pericardial effusion is present. There is no evidence of cardiac  tamponade. Mitral Valve: The mitral valve is normal in structure. Mild to moderate mitral valve regurgitation. No evidence of mitral valve stenosis. Tricuspid Valve: The tricuspid valve is normal in structure. Tricuspid valve regurgitation is mild . No evidence of tricuspid stenosis. Aortic Valve: The aortic valve is tricuspid. There is mild calcification of the aortic valve. There is mild thickening of the aortic valve. Aortic  valve regurgitation is mild. Aortic regurgitation PHT measures 516 msec. No aortic stenosis is present. Pulmonic Valve: The pulmonic valve was normal in structure. Pulmonic valve regurgitation is trivial. No evidence of pulmonic stenosis. Aorta: Aortic dilatation noted. There is mild dilatation of the aortic root, measuring 36 mm. There is moderate dilatation of the ascending aorta, measuring 43 mm. Venous: The inferior vena cava is dilated in size with less than 50% respiratory variability, suggesting right atrial pressure of 15 mmHg. IAS/Shunts: No atrial level shunt detected by color flow Doppler. Additional Comments: There is a large pleural effusion in the left lateral region.  LEFT VENTRICLE PLAX 2D LVIDd:         5.90 cm      Diastology LVIDs:         5.00 cm      LV e' medial:    3.89 cm/s LV PW:         1.00 cm      LV E/e' medial:  31.7 LV IVS:        0.90 cm      LV e' lateral:   3.89 cm/s LVOT diam:     2.10 cm      LV E/e' lateral: 31.7 LVOT Area:     3.46 cm  LV Volumes (MOD) LV vol d, MOD A2C: 121.0 ml LV vol d, MOD A4C: 161.0 ml LV vol s, MOD A2C: 93.8 ml LV vol s, MOD A4C: 124.0 ml LV SV MOD A2C:     27.2 ml LV SV MOD A4C:     161.0 ml LV SV MOD BP:      37.7 ml RIGHT VENTRICLE            IVC RV S prime:     6.32 cm/s  IVC diam: 2.50 cm TAPSE (M-mode): 0.6 cm LEFT ATRIUM              Index        RIGHT ATRIUM           Index LA diam:        5.00 cm  2.57 cm/m   RA Area:     34.10 cm LA Vol (A2C):   144.0 ml 74.00 ml/m  RA Volume:   115.00 ml 59.09 ml/m LA Vol (A4C):    116.0 ml 59.61 ml/m LA Biplane Vol: 131.0 ml 67.32 ml/m  AORTIC VALVE AI PHT:      516 msec  AORTA Ao Root diam: 3.60 cm Ao Asc diam:  4.30 cm MITRAL VALVE                  TRICUSPID VALVE MV Area (PHT): 4.61 cm       TR Peak grad:   11.3 mmHg MV Decel Time: 165 msec       TR Mean grad:   8.0 mmHg MR Peak grad:    98.4 mmHg    TR Vmax:        168.00 cm/s MR Mean grad:    61.0 mmHg    TR Vmean:       141.0 cm/s MR Vmax:         496.00 cm/s MR Vmean:        365.0 cm/s   SHUNTS MR PISA:         1.57 cm     Systemic Diam: 2.10 cm MR PISA Eff ROA: 12 mm MR PISA Radius:  0.50 cm MV E velocity: 123.50 cm/s Tiffany  Corben Auzenne Salvia MD Electronically signed by Chilton Si MD Signature Date/Time: 03/09/2022/5:09:09 PM    Final    DG Chest 2 View  Result Date: 03/08/2022 CLINICAL DATA:  Shortness of breath. EXAM: CHEST - 2 VIEW COMPARISON:  Chest radiograph 10/06/2021 and earlier; CT angio chest 05/21/2021 FINDINGS: Stable enlarged cardiac silhouette. Postoperative changes of median sternotomy and coronary artery bypass graft. Aortic calcifications. Retrocardiac airspace opacities in the left lower lobe with small layering left pleural effusion. Subsegmental atelectasis in the right lung base. Mild diffuse hazy interstitial thickening and prominence of the hilar vasculature. No pneumothorax. Anterior wedging of a distal thoracic vertebral body is similar to prior exam. IMPRESSION: Stable cardiomegaly with mild pulmonary edema and new small left layering pleural effusion. Left lower lobe opacities likely reflect combination of pleural fluid and adjacent atelectasis, though superimposed infection cannot be excluded. Aortic Atherosclerosis (ICD10-I70.0). Electronically Signed   By: Sherron Ales M.D.   On: 03/08/2022 19:02   Disposition   Pt is being discharged home today in good condition.  Follow-up Plans & Appointments     Follow-up Information     Wendall Stade, MD Follow up in 1 week(s).   Specialty:  Cardiology Why: office will call with appt Contact information: [address] [place] [address] [phone]         [facility] [address] [facility] [place] Follow up in 1 week(s).   Specialty: Cardiology Why: Please present for lab draw, you do not need to be fasting. Office will arrange this appt. Contact information: [address] [id number] [place] [address] [phone]               Discharge Instructions     Diet - low sodium heart healthy   Complete by: As directed    Increase activity slowly   Complete by: As directed         Discharge Medications   Allergies as of 03/12/2022       Reactions   Contrast Media [iodinated Contrast Media] Rash   19 years ago at [facility]   Metrizamide Rash   19 years ago at [facility] ([place])   Other Other (See Comments)   Patient preference: NO MEAT OR DAIRY!!   Amiodarone Other (See Comments)   Makes the patient walk sideways and he falls   Ciprofloxacin Other (See Comments)   Was told by MD to "not take"   Tramadol Nausea And Vomiting        Medication List     STOP taking these medications    cefdinir 300 MG capsule Commonly known as: OMNICEF   diltiazem 120 MG 24 hr capsule Commonly known as: CARDIZEM CD   dofetilide 125 MCG capsule Commonly known as: TIKOSYN   Tiadylt ER 120 MG 24 hr capsule Generic drug: diltiazem   torsemide 20 MG tablet Commonly known as: DEMADEX       TAKE these medications    Crestor 20 MG tablet Generic drug: rosuvastatin Take 20 mg by mouth every morning.   Eliquis 5 MG Tabs tablet Generic drug: apixaban Take 5 mg by mouth 2 (two) times daily.   ferrous sulfate 325 (65 FE) MG tablet Take 325 mg by mouth daily with breakfast.   furosemide 80 MG tablet Commonly known as: LASIX Take 1 tablet (80 mg total) by mouth 2 (two) times  daily. What changed:  medication strength how much  to take   Jardiance 10 MG Tabs tablet Generic drug: empagliflozin Take 10 mg by mouth every morning.   levothyroxine 50 MCG tablet Commonly known as: SYNTHROID Take 50 mcg by mouth daily.   metoprolol tartrate 50 MG tablet Commonly known as: LOPRESSOR Take 1 tablet (50 mg total) by mouth 2 (two) times daily. What changed:  medication strength how much to take   nitroGLYCERIN 0.4 MG SL tablet Commonly known as: NITROSTAT Place 0.4 mg under the tongue every 5 (five) minutes as needed for chest pain.   ondansetron 4 MG tablet Commonly known as: ZOFRAN Take 1 tablet (4 mg total) by mouth every 6 (six) hours.   One-A-Day Mens 50+ Advantage Tabs Take 1 tablet by mouth daily with breakfast.   potassium chloride 10 MEQ tablet Commonly known as: KLOR-CON Take 1 tablet (10 mEq total) by mouth daily.   testosterone cypionate 200 MG/ML injection Commonly known as: DEPOTESTOSTERONE CYPIONATE Inject 200 mg into the muscle every 28 (twenty-eight) days. Taking every 4 weeks What changed: Another medication with the same name was removed. Continue taking this medication, and follow the directions you see here.   Vitamin D-3 125 MCG (5000 UT) Tabs Take 1 capsule by mouth daily after breakfast.           Outstanding Labs/Studies   none  Duration of Discharge Encounter   Greater than 30 minutes including physician time.  Signed, Roe Rutherfordngela Nicole Annabelle Rexroad, PA 03/12/2022, 11:57 AM

## 2022-03-12 NOTE — Progress Notes (Addendum)
Rounding Note    Patient Name: Edgar Thomas Date of Encounter: 03/12/2022  [place] Cardiologist: Jenkins Rouge, MD   Subjective   Breathing is OK  NOCP   Inpatient Medications    Scheduled Meds:  apixaban  5 mg Oral BID   empagliflozin  10 mg Oral Daily   furosemide  80 mg Intravenous BID   insulin aspart  0-6 Units Subcutaneous TID WC   levothyroxine  50 mcg Oral Q0600   metoprolol tartrate  50 mg Oral BID   rosuvastatin  20 mg Oral Daily   sodium chloride flush  3 mL Intravenous Q12H   Continuous Infusions:  sodium chloride     PRN Meds: sodium chloride, acetaminophen, sodium chloride flush   Vital Signs    Vitals:   03/11/22 1138 03/11/22 1928 03/12/22 0238 03/12/22 0248  BP: 108/71 115/82 128/81   Pulse: 91 80 94   Resp: 18 18 18    Temp: 98 F (36.7 C) 98.2 F (36.8 C) 98.4 F (36.9 C)   TempSrc: Oral Oral Oral   SpO2: 94% 95% 95%   Weight:    68.4 kg    Intake/Output Summary (Last 24 hours) at 03/12/2022 0724 Last data filed at 03/11/2022 1700 Gross per 24 hour  Intake 723 ml  Output --  Net 723 ml   Net neg 6.4  L       03/12/2022    2:48 AM 03/10/2022    6:00 AM 03/09/2022    1:56 AM  Last 3 Weights  Weight (lbs) 150 lb 12.7 oz 153 lb 14.1 oz 169 lb 9.6 oz  Weight (kg) 68.4 kg 69.8 kg 76.93 kg      Telemetry    Afib  80s to 100s    - Personally Reviewed  ECG    No new  - Personally Reviewed  Physical Exam   GEN: Thin 88 yo in no acute distress.   Neck: JVP is minimally elevated  Cardiac: Irreg irreg  II/VI systolic murmur base  Respiratory  CTA GI: Soft, nontender, non-distended  MS: Triv edema at ankles   ;Neuro:  Nonfocal  Psych: Normal affect   Labs    High Sensitivity Troponin:  No results for input(s): "TROPONINIHS" in the last 720 hours.   Chemistry Recent Labs  Lab 03/08/22 1825 03/09/22 0043 03/10/22 0131 03/10/22 1550 03/11/22 0941  NA 139 138 141 138 139  K 3.9 3.8 4.3 4.0 3.3*  CL 105  105 105 104 101  CO2 25 28 28 25 28   GLUCOSE 92 90 128* 177* 136*  BUN 21 24* 23 30* 31*  CREATININE 1.12 1.18 1.19 1.28* 1.19  CALCIUM 8.9 8.5* 9.0 8.8* 8.7*  MG 2.2 2.1  --   --   --   GFRNONAA >60 59* 58* 53* 58*  ANIONGAP 9 5 8 9 10     Lipids No results for input(s): "CHOL", "TRIG", "HDL", "LABVLDL", "LDLCALC", "CHOLHDL" in the last 168 hours.  Hematology Recent Labs  Lab 03/08/22 1825  WBC 6.6  RBC 4.39  HGB 14.7  HCT 43.1  MCV 98.2  MCH 33.5  MCHC 34.1  RDW 16.0*  PLT 129*   Thyroid No results for input(s): "TSH", "FREET4" in the last 168 hours.  BNP Recent Labs  Lab 03/08/22 1825 03/10/22 1550  BNP 911.3* 1,431.0*     Net Neg 4.1 L   DDimer No results for input(s): "DDIMER" in the last 168 hours.   Radiology  DG Chest Port 1 View  Result Date: 03/11/2022 CLINICAL DATA:  Pleural effusion EXAM: PORTABLE CHEST 1 VIEW COMPARISON:  03/08/2022 FINDINGS: Stable cardiomegaly status post sternotomy and CABG. Aortic atherosclerosis. Similar small left pleural effusion with associated left basilar opacity chronic bilateral interstitial prominence. No pneumothorax. IMPRESSION: Unchanged small left pleural effusion with associated left basilar opacity. Electronically Signed   By: Davina Poke D.O.   On: 03/11/2022 09:43    Cardiac Studies   Echo   03/09/22    1. Left ventricular ejection fraction, by estimation, is 20 to 25%. The  left ventricle has severely decreased function. The left ventricle  demonstrates global hypokinesis. The left ventricular internal cavity size  was mildly to moderately dilated.  Indeterminate diastolic filling due to E-A fusion. Elevated left  ventricular end-diastolic pressure.   2. Right ventricular systolic function is normal. The right ventricular  size is normal. There is normal pulmonary artery systolic pressure.   3. Left atrial size was severely dilated.   4. Right atrial size was severely dilated.   5. A small  pericardial effusion is present. There is no evidence of  cardiac tamponade. Large pleural effusion in the left lateral region.   6. The mitral valve is normal in structure. Mild to moderate mitral valve  regurgitation. No evidence of mitral stenosis.   7. The aortic valve is tricuspid. There is mild calcification of the  aortic valve. There is mild thickening of the aortic valve. Aortic valve  regurgitation is mild. No aortic stenosis is present.   8. Aortic dilatation noted. There is mild dilatation of the aortic root,  measuring 36 mm. There is moderate dilatation of the ascending aorta,  measuring 43 mm.   9. The inferior vena cava is dilated in size with <50% respiratory  variability, suggesting right atrial pressure of 15 mmHg.   Patient Profile     88 y.o. male with Hx of CAD (CABG 2015), PAF, HFrEF, MR , CVA   Admitted from clnic on 10/11 for Afib with RVR and volume overload.    Assessment & Plan    1  Atrial fibrillation  pt presented to clinic  on 10/10 in afib with RVR.   Admitted to hosp He had been on Tikosyn 125 bid     I recom stopping since in afib with RVR  It looks like he did nto tolerate amiodarone in past (walked sideways)   No other medicine options   Plan for rate control  He came in on metoprolol 37.5 bid and dilt 120   He is now on metoprolol 50 bid      Control is pretty good   WOuld probably get an outpt 48 patch to follow at home   Can be sent to hemt   Assure rate control  He does not do that much   He has been on Eliquis   Continue   2  CAD  S/p CABG 2015 (LIMA to LAD; SVG to OM1/2; SVG to PDA)      Pt has not had CP      3  HFrEF   Echo in 2019 LVEf 35 to 40%   Echo this admit  LVEF is 25%  Worse  ? Tachy induced.   Atria are severely dilated   There is a large pleural effusion (too small to tap) ? If worseing LVEF reflects afib with RVR   Would do better in SR but there is no good medical choice  He  has improved signficantly from admit    Breathing is  much better He was on lasix 40 bid at home     I would go to 80 bid  Would give 10 KCL daily   Was not on at home but came in low    K 4.1 today   Flllow       Will need BMET in about 7 to 10 days   Discussed daily weights   If increase 3 to 4 lbs quickly the n retaining   4  MR   Moderate on echo in 2019   Mild to moderate now     5  Pulm  Much improved with diuresis  CTA today   6  Hx CVA   Keep on Eliquis      7  Hx of DM  A1C now 5     8 Hx DVT   R femoral v in Dec 2022  9   Hx HTN  BP is good   10  HL  Keep on Crestor    11   DNR For questions or updates, please contact [place] Please consult [email or web address] for contact info under        Signed, Dorris Carnes, MD  03/12/2022, 7:24 AM

## 2022-03-12 NOTE — TOC Transition Note (Signed)
Transition of Care [facility]) - CM/SW Discharge Note   Patient Details  Name: Edgar Thomas MRN: [id number] Date of Birth: 1930/01/13  Transition of Care [facility]) CM/SW Contact:  Carles Collet, RN Phone Number: 03/12/2022, 12:15 PM   Clinical Narrative:      [name] w patient and wife at bedside. They decline assistance at home, confirm he has all needed DME. No barriers to care identified. No TOC needs for DC identified.        Patient Goals and CMS Choice        Discharge Placement                       Discharge Plan and Services                                     Social Determinants of Health (SDOH) Interventions Food Insecurity Interventions: Intervention Not Indicated Housing Interventions: Intervention Not Indicated Transportation Interventions: Intervention Not Indicated Utilities Interventions: Intervention Not Indicated Financial Strain Interventions: Intervention Not Indicated   Readmission Risk Interventions     No data to display

## 2022-03-13 ENCOUNTER — Ambulatory Visit: Payer: Medicare Other | Attending: Physician Assistant

## 2022-03-13 ENCOUNTER — Telehealth: Payer: Self-pay

## 2022-03-13 DIAGNOSIS — I482 Chronic atrial fibrillation, unspecified: Secondary | ICD-10-CM

## 2022-03-13 DIAGNOSIS — I509 Heart failure, unspecified: Secondary | ICD-10-CM

## 2022-03-13 DIAGNOSIS — I5021 Acute systolic (congestive) heart failure: Secondary | ICD-10-CM

## 2022-03-13 NOTE — Telephone Encounter (Signed)
Called and made patient an appointment to see Dr. [name] on 03/23/22 at 11:15 am. Patient will get lab work with PCP or come to our office for lab work on Friday. Will place order for BMET

## 2022-03-13 NOTE — Telephone Encounter (Signed)
-----   Message from Darrell Jewel, RN sent at 03/13/2022 10:17 AM EDT -----  ----- Message ----- From: Ledora Bottcher, PA Sent: 03/12/2022  11:51 AM EDT To: Rebeca Alert [facility] Triage  [place] patient needs close hospital follow up. Can you please ask his RN if he can see in the next 10 days or so?  Also needs BMP in 7 days, can you please help schedule?  Thanks Angie

## 2022-03-13 NOTE — Progress Notes (Unsigned)
Enrolled for [employer] to mail a ZIO XT long term holter monitor to the patients address on file.   Dr. Nishan to read. 

## 2022-03-16 DIAGNOSIS — I5021 Acute systolic (congestive) heart failure: Secondary | ICD-10-CM | POA: Diagnosis not present

## 2022-03-16 DIAGNOSIS — I482 Chronic atrial fibrillation, unspecified: Secondary | ICD-10-CM | POA: Diagnosis not present

## 2022-03-17 DIAGNOSIS — E538 Deficiency of other specified B group vitamins: Secondary | ICD-10-CM | POA: Diagnosis not present

## 2022-03-17 DIAGNOSIS — I10 Essential (primary) hypertension: Secondary | ICD-10-CM | POA: Diagnosis not present

## 2022-03-17 DIAGNOSIS — E039 Hypothyroidism, unspecified: Secondary | ICD-10-CM | POA: Diagnosis not present

## 2022-03-17 DIAGNOSIS — E291 Testicular hypofunction: Secondary | ICD-10-CM | POA: Diagnosis not present

## 2022-03-17 DIAGNOSIS — I509 Heart failure, unspecified: Secondary | ICD-10-CM | POA: Diagnosis not present

## 2022-03-19 NOTE — Progress Notes (Signed)
CARDIOLOGY CONSULT NOTE       Patient ID: Christa Munshi MRN: [id number] DOB/AGE: Apr 13, 1930 88 y.o.  Referring Physician: Nedra Hai Primary Physician: Simone Curia, MD Primary Cardiologist: Eden Emms Reason for Consultation: CAD/PAF/CHF   HPI:  88 y.o. DNR referred by DR Nedra Hai 03/08/22 for CAD/CABG/PAF/ and CHF Previously seen by me in 2018 History of CABG in 2015 LIMA to LAD, Sequentiaql SVG to OM1/2, and SVG to PDA. Last echo in 2019 at [facility] showed EF 35-40% with moderate MR Admitted 05/20/21 with metabolic encephalopathy flu positive and right femoral vein DVT Prior history of stroke was on eliquis and oncology did not recommend changing DOAC has been on Tikosyn for afib but appears chronic No CHF exacerbations or chest pains On beta blocker and lasix but ARB appears to have been stopped BUN 24 Cr 0.85 Note allergy to amiodarone   He is actually the father of one of my patients [name] who has had MVR. His wife [name] is about 3 years younger than him He has 5 kids and she had one before they married She seems to be very attentive and cares for him well  IN office 03/08/22 he moves slowly He has pursed lipped breathing and we are unable to get a good sat reading on him. He is in rapid afib rates 120 and he has gross volume overload with edema to mid thigh and component of lymph edema   He was sent to hospital after initial visit on 03/08/22  TTE showed EF 20-25% with mild/mod MR and mild AR RV was normal He was diuresed about 9 kg with d/c weight of 68.4 kg Meds includied lasix 80 mg bid, Jardiance , lopressor 50 mg bid K 10 Meq On admission BNP was 1431  Dc labs K 4.1 BUN 33 Cr 1.17  Feels better Quality of life still tenuous and not very ambulatory Wife emotional today about his failing health  ROS All other systems reviewed and negative except as noted above  Past Medical History:  Diagnosis Date   A-fib (HCC)    Anemia    CAD (coronary artery disease)    Constipation    GERD  (gastroesophageal reflux disease)    Heart attack (HCC)    Heartburn    Hyperlipemia    Hypertension    Post herpetic neuralgia    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   Left lenticulostriate and external capsule   Stroke (HCC)    TIA (transient ischemic attack)    Vitamin D deficiency     Family History  Problem Relation Age of Onset   Heart disease Mother        died at age 31 of heart attack   Leukemia Sister    Heart disease Brother    Hypertension Brother     Social History   Socioeconomic History   Marital status: Married    Spouse name: kim   Number of children: 6   Years of education: Not on file   Highest education level: Not on file  Occupational History   Occupation: retired    Comment: Management consultant  Tobacco Use   Smoking status: Never   Smokeless tobacco: Never  Substance and Sexual Activity   Alcohol use: No   Drug use: No   Sexual activity: Not on file  Other Topics Concern   Not on file  Social History Narrative   Lives with wife   Right Handed   Drinks no caffeine   Social Determinants of Health  Financial Resource Strain: Low Risk  (03/09/2022)   Overall Financial Resource Strain (CARDIA)    Difficulty of Paying Living Expenses: Not hard at all  Food Insecurity: No Food Insecurity (03/09/2022)   Hunger Vital Sign    Worried About Running Out of Food in the Last Year: Never true    Ran Out of Food in the Last Year: Never true  Transportation Needs: No Transportation Needs (03/09/2022)   PRAPARE - Administrator, Civil Service (Medical): No    Lack of Transportation (Non-Medical): No  Physical Activity: Not on file  Stress: Not on file  Social Connections: Not on file  Intimate Partner Violence: Not on file    Past Surgical History:  Procedure Laterality Date   balloon angioplasty of LAD     CARDIOVERSION  2018   a fib   CATARACT EXTRACTION, BILATERAL  1019, 2022   COLONOSCOPY     CORONARY ARTERY BYPASS GRAFT N/A 12/03/2013    Procedure: CORONARY ARTERY BYPASS GRAFTING (CABG) x4 using left internal mammary artery and right greater saphenous vein. LIMA to LAD, sequential SVG to OM 1 & OM 2, SVG to PD;  Surgeon: Delight Ovens, MD;  [facility];  Service: Open Heart Surgery;  Laterality: N/A;   ENDOVEIN HARVEST OF GREATER SAPHENOUS VEIN Right 12/03/2013   Procedure: ENDOVEIN HARVEST OF GREATER SAPHENOUS VEIN;  Surgeon: Delight Ovens, MD;  Location: [facility] OR;  Service: Open Heart Surgery;  Laterality: Right;   INTRAOPERATIVE TRANSESOPHAGEAL ECHOCARDIOGRAM N/A 12/03/2013   Procedure: INTRAOPERATIVE TRANSESOPHAGEAL ECHOCARDIOGRAM;  Surgeon: Delight Ovens, MD;  [facility];  Service: Open Heart Surgery;  Laterality: N/A;      Current Outpatient Medications:    apixaban (ELIQUIS) 5 MG TABS tablet, Take 5 mg by mouth 2 (two) times daily., Disp: , Rfl:    Cholecalciferol (VITAMIN D-3) 125 MCG (5000 UT) TABS, Take 1 capsule by mouth daily after breakfast., Disp: , Rfl:    CRESTOR 20 MG tablet, Take 20 mg by mouth every morning. , Disp: , Rfl: 3   ferrous sulfate 325 (65 FE) MG tablet, Take 325 mg by mouth daily with breakfast., Disp: , Rfl:    furosemide (LASIX) 80 MG tablet, Take 1 tablet (80 mg total) by mouth 2 (two) times daily., Disp: 180 tablet, Rfl: 3   JARDIANCE 10 MG TABS tablet, Take 10 mg by mouth every morning., Disp: , Rfl:    levothyroxine (SYNTHROID) 50 MCG tablet, Take 50 mcg by mouth daily., Disp: , Rfl:    metoprolol tartrate (LOPRESSOR) 50 MG tablet, Take 1 tablet (50 mg total) by mouth 2 (two) times daily., Disp: 180 tablet, Rfl: 3   Multiple Vitamins-Minerals (ONE-A-DAY MENS 50+ ADVANTAGE) TABS, Take 1 tablet by mouth daily with breakfast. , Disp: , Rfl:    nitroGLYCERIN (NITROSTAT) 0.4 MG SL tablet, Place 0.4 mg under the tongue every 5 (five) minutes as needed for chest pain., Disp: , Rfl:    ondansetron (ZOFRAN) 4 MG tablet, Take 1 tablet (4 mg total) by mouth every 6 (six) hours. (Patient not  taking: Reported on 03/08/2022), Disp: 12 tablet, Rfl: 0   potassium chloride (KLOR-CON) 10 MEQ tablet, Take 1 tablet (10 mEq total) by mouth daily., Disp: 30 tablet, Rfl: 6   testosterone cypionate (DEPOTESTOSTERONE CYPIONATE) 200 MG/ML injection, Inject 200 mg into the muscle every 28 (twenty-eight) days. Taking every 4 weeks, Disp: , Rfl:     Physical Exam: There were no vitals taken  for this visit.    Elderly male Dementia Lungs clear PMI increased no murmur Plus 2 LE edema ankles    Labs:   Lab Results  Component Value Date   WBC 6.6 03/08/2022   HGB 14.7 03/08/2022   HCT 43.1 03/08/2022   MCV 98.2 03/08/2022   PLT 129 (L) 03/08/2022   No results for input(s): "NA", "K", "CL", "CO2", "BUN", "CREATININE", "CALCIUM", "PROT", "BILITOT", "ALKPHOS", "ALT", "AST", "GLUCOSE" in the last 168 hours.  Invalid input(s): "LABALBU" Lab Results  Component Value Date   CKTOTAL 37 (L) 05/02/2021    Lab Results  Component Value Date   CHOL 100 11/11/2018   Lab Results  Component Value Date   HDL 35 (L) 11/11/2018   Lab Results  Component Value Date   LDLCALC 54 11/11/2018   Lab Results  Component Value Date   TRIG 54 11/11/2018   Lab Results  Component Value Date   CHOLHDL 2.9 11/11/2018   No results found for: "LDLDIRECT"    Radiology: [facility] Chest Port 1 View  Result Date: 03/11/2022 CLINICAL DATA:  Pleural effusion EXAM: PORTABLE CHEST 1 VIEW COMPARISON:  03/08/2022 FINDINGS: Stable cardiomegaly status post sternotomy and CABG. Aortic atherosclerosis. Similar small left pleural effusion with associated left basilar opacity chronic bilateral interstitial prominence. No pneumothorax. IMPRESSION: Unchanged small left pleural effusion with associated left basilar opacity. Electronically Signed   By: Duanne Guess D.O.   On: 03/11/2022 09:43   ECHOCARDIOGRAM COMPLETE  Result Date: 03/09/2022    ECHOCARDIOGRAM REPORT   Patient Name:   ALGER ASTON Date of Exam:  03/09/2022 Medical Rec #:  [id number]    Height:       70.0 in Accession #:    [id number]   Weight:       169.6 lb Date of Birth:  25-Aug-1929     BSA:          1.946 m Patient Age:    89 years     BP:           124/107 mmHg Patient Gender: M            HR:           117 bpm. Exam Location:  Inpatient Procedure: 2D Echo, Cardiac Doppler and Color Doppler Indications:    CHF - acute diastolic  History:        Patient has prior history of Echocardiogram examinations, most                 recent 08/15/2017. CHF, CAD, Prior CABG, Stroke,                 Arrythmias:Atrial Fibrillation; Risk Factors:Hypertension and                 Dyslipidemia.  Sonographer:    Milda Smart Referring Phys: [id number] Cyndi Bender  Sonographer Comments: Image acquisition challenging due to patient body habitus and Image acquisition challenging due to respiratory motion. IMPRESSIONS  1. Left ventricular ejection fraction, by estimation, is 20 to 25%. The left ventricle has severely decreased function. The left ventricle demonstrates global hypokinesis. The left ventricular internal cavity size was mildly to moderately dilated. Indeterminate diastolic filling due to E-A fusion. Elevated left ventricular end-diastolic pressure.  2. Right ventricular systolic function is normal. The right ventricular size is normal. There is normal pulmonary artery systolic pressure.  3. Left atrial size was severely dilated.  4. Right atrial size was severely dilated.  5. A small  pericardial effusion is present. There is no evidence of cardiac tamponade. Large pleural effusion in the left lateral region.  6. The mitral valve is normal in structure. Mild to moderate mitral valve regurgitation. No evidence of mitral stenosis.  7. The aortic valve is tricuspid. There is mild calcification of the aortic valve. There is mild thickening of the aortic valve. Aortic valve regurgitation is mild. No aortic stenosis is present.  8. Aortic dilatation noted. There is mild  dilatation of the aortic root, measuring 36 mm. There is moderate dilatation of the ascending aorta, measuring 43 mm.  9. The inferior vena cava is dilated in size with <50% respiratory variability, suggesting right atrial pressure of 15 mmHg. FINDINGS  Left Ventricle: Left ventricular ejection fraction, by estimation, is 20 to 25%. The left ventricle has severely decreased function. The left ventricle demonstrates global hypokinesis. The left ventricular internal cavity size was mildly to moderately dilated. There is no left ventricular hypertrophy. Indeterminate diastolic filling due to E-A fusion. Elevated left ventricular end-diastolic pressure. Right Ventricle: The right ventricular size is normal. No increase in right ventricular wall thickness. Right ventricular systolic function is normal. There is normal pulmonary artery systolic pressure. The tricuspid regurgitant velocity is 1.68 m/s, and  with an assumed right atrial pressure of 15 mmHg, the estimated right ventricular systolic pressure is 26.3 mmHg. Left Atrium: Left atrial size was severely dilated. Right Atrium: Right atrial size was severely dilated. Pericardium: A small pericardial effusion is present. There is no evidence of cardiac tamponade. Mitral Valve: The mitral valve is normal in structure. Mild to moderate mitral valve regurgitation. No evidence of mitral valve stenosis. Tricuspid Valve: The tricuspid valve is normal in structure. Tricuspid valve regurgitation is mild . No evidence of tricuspid stenosis. Aortic Valve: The aortic valve is tricuspid. There is mild calcification of the aortic valve. There is mild thickening of the aortic valve. Aortic valve regurgitation is mild. Aortic regurgitation PHT measures 516 msec. No aortic stenosis is present. Pulmonic Valve: The pulmonic valve was normal in structure. Pulmonic valve regurgitation is trivial. No evidence of pulmonic stenosis. Aorta: Aortic dilatation noted. There is mild dilatation  of the aortic root, measuring 36 mm. There is moderate dilatation of the ascending aorta, measuring 43 mm. Venous: The inferior vena cava is dilated in size with less than 50% respiratory variability, suggesting right atrial pressure of 15 mmHg. IAS/Shunts: No atrial level shunt detected by color flow Doppler. Additional Comments: There is a large pleural effusion in the left lateral region.  LEFT VENTRICLE PLAX 2D LVIDd:         5.90 cm      Diastology LVIDs:         5.00 cm      LV e' medial:    3.89 cm/s LV PW:         1.00 cm      LV E/e' medial:  31.7 LV IVS:        0.90 cm      LV e' lateral:   3.89 cm/s LVOT diam:     2.10 cm      LV E/e' lateral: 31.7 LVOT Area:     3.46 cm  LV Volumes (MOD) LV vol d, MOD A2C: 121.0 ml LV vol d, MOD A4C: 161.0 ml LV vol s, MOD A2C: 93.8 ml LV vol s, MOD A4C: 124.0 ml LV SV MOD A2C:     27.2 ml LV SV MOD A4C:     161.0  ml LV SV MOD BP:      37.7 ml RIGHT VENTRICLE            IVC RV S prime:     6.32 cm/s  IVC diam: 2.50 cm TAPSE (M-mode): 0.6 cm LEFT ATRIUM              Index        RIGHT ATRIUM           Index LA diam:        5.00 cm  2.57 cm/m   RA Area:     34.10 cm LA Vol (A2C):   144.0 ml 74.00 ml/m  RA Volume:   115.00 ml 59.09 ml/m LA Vol (A4C):   116.0 ml 59.61 ml/m LA Biplane Vol: 131.0 ml 67.32 ml/m  AORTIC VALVE AI PHT:      516 msec  AORTA Ao Root diam: 3.60 cm Ao Asc diam:  4.30 cm MITRAL VALVE                  TRICUSPID VALVE MV Area (PHT): 4.61 cm       TR Peak grad:   11.3 mmHg MV Decel Time: 165 msec       TR Mean grad:   8.0 mmHg MR Peak grad:    98.4 mmHg    TR Vmax:        168.00 cm/s MR Mean grad:    61.0 mmHg    TR Vmean:       141.0 cm/s MR Vmax:         496.00 cm/s MR Vmean:        365.0 cm/s   SHUNTS MR PISA:         1.57 cm     Systemic Diam: 2.10 cm MR PISA Eff ROA: 12 mm MR PISA Radius:  0.50 cm MV E velocity: 123.50 cm/s Chilton Si MD Electronically signed by Chilton Si MD Signature Date/Time: 03/09/2022/5:09:09 PM    Final     DG Chest 2 View  Result Date: 03/08/2022 CLINICAL DATA:  Shortness of breath. EXAM: CHEST - 2 VIEW COMPARISON:  Chest radiograph 10/06/2021 and earlier; CT angio chest 05/21/2021 FINDINGS: Stable enlarged cardiac silhouette. Postoperative changes of median sternotomy and coronary artery bypass graft. Aortic calcifications. Retrocardiac airspace opacities in the left lower lobe with small layering left pleural effusion. Subsegmental atelectasis in the right lung base. Mild diffuse hazy interstitial thickening and prominence of the hilar vasculature. No pneumothorax. Anterior wedging of a distal thoracic vertebral body is similar to prior exam. IMPRESSION: Stable cardiomegaly with mild pulmonary edema and new small left layering pleural effusion. Left lower lobe opacities likely reflect combination of pleural fluid and adjacent atelectasis, though superimposed infection cannot be excluded. Aortic Atherosclerosis (ICD10-I70.0). Electronically Signed   By: Sherron Ales M.D.   On: 03/08/2022 19:02    EKG: afib nonspecific ST changes IvCD   ASSESSMENT AND PLAN:   Afib:  Cardizem d/c chronic with severe bi atrial enlargement Tikosyn d/c intolerant to amiodarone in past Rate control strategy with anticoagulation with Eliquis Dosing with normal Cr, age and weight  > than 60 kg appropriate for 5 mg bid  CAD:  Distant CABG no chest pain given age and lack of symptoms with DNR status no indication for stress testing Ischemic DCM:  EF 20-25% diuresed with lasix 80 mf bid with K supplementation BP low will avoid ARB/ARNI for now  DM:  Discussed low carb diet.  Target  hemoglobin A1c is 6.5 or less.  Continue current medications. DVT:  right femoral vein by duplex 05/22/21   COPD:  contributes to hypoxemia  CXR on d/c small left pleural effusion    F/U 3 months DNR  Signed: Charlton Haws 03/19/2022, 6:39 PM

## 2022-03-23 ENCOUNTER — Encounter: Payer: Self-pay | Admitting: Cardiovascular Disease

## 2022-03-23 ENCOUNTER — Ambulatory Visit: Payer: Medicare Other | Attending: Cardiovascular Disease | Admitting: Cardiovascular Disease

## 2022-03-23 VITALS — BP 110/80 | HR 90 | Ht 70.0 in | Wt 149.0 lb

## 2022-03-23 DIAGNOSIS — I4891 Unspecified atrial fibrillation: Secondary | ICD-10-CM | POA: Diagnosis not present

## 2022-03-23 DIAGNOSIS — I5021 Acute systolic (congestive) heart failure: Secondary | ICD-10-CM | POA: Diagnosis not present

## 2022-03-23 NOTE — Patient Instructions (Signed)
Medication Instructions:  Your physician recommends that you continue on your current medications as directed. Please refer to the Current Medication list given to you today.  *If you need a refill on your cardiac medications before your next appointment, please call your pharmacy*  Lab Work: If you have labs (blood work) drawn today and your tests are completely normal, you will receive your results only by: MyChart Message (if you have MyChart) OR A paper copy in the mail If you have any lab test that is abnormal or we need to change your treatment, we will call you to review the results.  Testing/Procedures: None ordered today.  Follow-Up: At [facility], you and your health needs are our priority.  As part of our continuing mission to provide you with exceptional heart care, we have created designated Provider Care Teams.  These Care Teams include your primary Cardiologist (physician) and Advanced Practice Providers (APPs -  Physician Assistants and Nurse Practitioners) who all work together to provide you with the care you need, when you need it.  We recommend signing up for the patient portal called "MyChart".  Sign up information is provided on this After Visit Summary.  MyChart is used to connect with patients for Virtual Visits (Telemedicine).  Patients are able to view lab/test results, encounter notes, upcoming appointments, etc.  Non-urgent messages can be sent to your provider as well.   To learn more about what you can do with MyChart, go to [email or web address].    Your next appointment:   3 month(s)  The format for your next appointment:   In Person  Provider:   Peter Nishan, MD     Important Information About Sugar       

## 2022-03-29 DIAGNOSIS — I482 Chronic atrial fibrillation, unspecified: Secondary | ICD-10-CM | POA: Diagnosis not present

## 2022-03-29 DIAGNOSIS — I5021 Acute systolic (congestive) heart failure: Secondary | ICD-10-CM | POA: Diagnosis not present

## 2022-04-21 ENCOUNTER — Other Ambulatory Visit: Payer: Self-pay

## 2022-04-21 ENCOUNTER — Inpatient Hospital Stay (HOSPITAL_COMMUNITY)
Admission: EM | Admit: 2022-04-21 | Discharge: 2022-04-24 | DRG: 193 | Disposition: A | Discharge status: Home / Self Care | Payer: Medicare Other | Attending: Internal Medicine | Admitting: Internal Medicine

## 2022-04-21 ENCOUNTER — Other Ambulatory Visit (HOSPITAL_COMMUNITY): Payer: Medicare Other

## 2022-04-21 ENCOUNTER — Emergency Department (HOSPITAL_COMMUNITY): Payer: Medicare Other

## 2022-04-21 DIAGNOSIS — Z1152 Encounter for screening for COVID-19: Secondary | ICD-10-CM | POA: Diagnosis not present

## 2022-04-21 DIAGNOSIS — I482 Chronic atrial fibrillation, unspecified: Secondary | ICD-10-CM | POA: Diagnosis not present

## 2022-04-21 DIAGNOSIS — E039 Hypothyroidism, unspecified: Secondary | ICD-10-CM | POA: Diagnosis present

## 2022-04-21 DIAGNOSIS — E785 Hyperlipidemia, unspecified: Secondary | ICD-10-CM | POA: Diagnosis not present

## 2022-04-21 DIAGNOSIS — F0390 Unspecified dementia without behavioral disturbance: Secondary | ICD-10-CM | POA: Diagnosis present

## 2022-04-21 DIAGNOSIS — K219 Gastro-esophageal reflux disease without esophagitis: Secondary | ICD-10-CM | POA: Diagnosis present

## 2022-04-21 DIAGNOSIS — I2489 Other forms of acute ischemic heart disease: Secondary | ICD-10-CM | POA: Diagnosis not present

## 2022-04-21 DIAGNOSIS — I255 Ischemic cardiomyopathy: Secondary | ICD-10-CM | POA: Diagnosis present

## 2022-04-21 DIAGNOSIS — J9601 Acute respiratory failure with hypoxia: Secondary | ICD-10-CM | POA: Diagnosis not present

## 2022-04-21 DIAGNOSIS — B0229 Other postherpetic nervous system involvement: Secondary | ICD-10-CM | POA: Diagnosis not present

## 2022-04-21 DIAGNOSIS — Z7189 Other specified counseling: Secondary | ICD-10-CM | POA: Diagnosis not present

## 2022-04-21 DIAGNOSIS — I130 Hypertensive heart and chronic kidney disease with heart failure and stage 1 through stage 4 chronic kidney disease, or unspecified chronic kidney disease: Secondary | ICD-10-CM | POA: Diagnosis present

## 2022-04-21 DIAGNOSIS — Z7401 Bed confinement status: Secondary | ICD-10-CM

## 2022-04-21 DIAGNOSIS — J189 Pneumonia, unspecified organism: Principal | ICD-10-CM | POA: Diagnosis present

## 2022-04-21 DIAGNOSIS — Z7984 Long term (current) use of oral hypoglycemic drugs: Secondary | ICD-10-CM

## 2022-04-21 DIAGNOSIS — I110 Hypertensive heart disease with heart failure: Secondary | ICD-10-CM | POA: Diagnosis not present

## 2022-04-21 DIAGNOSIS — J90 Pleural effusion, not elsewhere classified: Secondary | ICD-10-CM | POA: Diagnosis not present

## 2022-04-21 DIAGNOSIS — I5023 Acute on chronic systolic (congestive) heart failure: Secondary | ICD-10-CM | POA: Diagnosis not present

## 2022-04-21 DIAGNOSIS — Z7901 Long term (current) use of anticoagulants: Secondary | ICD-10-CM

## 2022-04-21 DIAGNOSIS — N1831 Chronic kidney disease, stage 3a: Secondary | ICD-10-CM | POA: Diagnosis present

## 2022-04-21 DIAGNOSIS — I509 Heart failure, unspecified: Secondary | ICD-10-CM | POA: Diagnosis not present

## 2022-04-21 DIAGNOSIS — Z8249 Family history of ischemic heart disease and other diseases of the circulatory system: Secondary | ICD-10-CM

## 2022-04-21 DIAGNOSIS — R1310 Dysphagia, unspecified: Secondary | ICD-10-CM | POA: Diagnosis present

## 2022-04-21 DIAGNOSIS — E559 Vitamin D deficiency, unspecified: Secondary | ICD-10-CM | POA: Diagnosis present

## 2022-04-21 DIAGNOSIS — Z79899 Other long term (current) drug therapy: Secondary | ICD-10-CM

## 2022-04-21 DIAGNOSIS — Z79890 Hormone replacement therapy (postmenopausal): Secondary | ICD-10-CM

## 2022-04-21 DIAGNOSIS — R109 Unspecified abdominal pain: Secondary | ICD-10-CM | POA: Diagnosis not present

## 2022-04-21 DIAGNOSIS — R531 Weakness: Secondary | ICD-10-CM

## 2022-04-21 DIAGNOSIS — I252 Old myocardial infarction: Secondary | ICD-10-CM

## 2022-04-21 DIAGNOSIS — N179 Acute kidney failure, unspecified: Secondary | ICD-10-CM | POA: Diagnosis present

## 2022-04-21 DIAGNOSIS — Z515 Encounter for palliative care: Secondary | ICD-10-CM | POA: Diagnosis not present

## 2022-04-21 DIAGNOSIS — N189 Chronic kidney disease, unspecified: Secondary | ICD-10-CM | POA: Diagnosis present

## 2022-04-21 DIAGNOSIS — J811 Chronic pulmonary edema: Secondary | ICD-10-CM | POA: Diagnosis not present

## 2022-04-21 DIAGNOSIS — Z66 Do not resuscitate: Secondary | ICD-10-CM | POA: Diagnosis not present

## 2022-04-21 DIAGNOSIS — I2510 Atherosclerotic heart disease of native coronary artery without angina pectoris: Secondary | ICD-10-CM | POA: Diagnosis present

## 2022-04-21 DIAGNOSIS — Z888 Allergy status to other drugs, medicaments and biological substances status: Secondary | ICD-10-CM

## 2022-04-21 DIAGNOSIS — Z885 Allergy status to narcotic agent status: Secondary | ICD-10-CM

## 2022-04-21 DIAGNOSIS — I4891 Unspecified atrial fibrillation: Secondary | ICD-10-CM | POA: Diagnosis not present

## 2022-04-21 DIAGNOSIS — Z881 Allergy status to other antibiotic agents status: Secondary | ICD-10-CM

## 2022-04-21 DIAGNOSIS — Z951 Presence of aortocoronary bypass graft: Secondary | ICD-10-CM | POA: Diagnosis not present

## 2022-04-21 DIAGNOSIS — R0602 Shortness of breath: Secondary | ICD-10-CM | POA: Diagnosis not present

## 2022-04-21 DIAGNOSIS — I480 Paroxysmal atrial fibrillation: Secondary | ICD-10-CM | POA: Diagnosis not present

## 2022-04-21 DIAGNOSIS — Z86718 Personal history of other venous thrombosis and embolism: Secondary | ICD-10-CM

## 2022-04-21 DIAGNOSIS — Z91041 Radiographic dye allergy status: Secondary | ICD-10-CM

## 2022-04-21 DIAGNOSIS — Z8673 Personal history of transient ischemic attack (TIA), and cerebral infarction without residual deficits: Secondary | ICD-10-CM

## 2022-04-21 DIAGNOSIS — R059 Cough, unspecified: Secondary | ICD-10-CM | POA: Diagnosis not present

## 2022-04-21 LAB — COMPREHENSIVE METABOLIC PANEL
ALT: 23 U/L (ref 0–44)
AST: 33 U/L (ref 15–41)
Albumin: 3.7 g/dL (ref 3.5–5.0)
Alkaline Phosphatase: 176 U/L — ABNORMAL HIGH (ref 38–126)
Anion gap: 15 (ref 5–15)
BUN: 31 mg/dL — ABNORMAL HIGH (ref 8–23)
CO2: 26 mmol/L (ref 22–32)
Calcium: 9.6 mg/dL (ref 8.9–10.3)
Chloride: 103 mmol/L (ref 98–111)
Creatinine, Ser: 1.52 mg/dL — ABNORMAL HIGH (ref 0.61–1.24)
GFR, Estimated: 43 mL/min — ABNORMAL LOW (ref >60)
Glucose, Bld: 99 mg/dL (ref 70–99)
Potassium: 4.2 mmol/L (ref 3.5–5.1)
Sodium: 144 mmol/L (ref 135–145)
Total Bilirubin: 1.7 mg/dL — ABNORMAL HIGH (ref 0.3–1.2)
Total Protein: 7.8 g/dL (ref 6.5–8.1)

## 2022-04-21 LAB — CBC
HCT: 49.2 % (ref 39.0–52.0)
Hemoglobin: 16.2 g/dL (ref 13.0–17.0)
MCH: 32.6 pg (ref 26.0–34.0)
MCHC: 32.9 g/dL (ref 30.0–36.0)
MCV: 99 fL (ref 80.0–100.0)
Platelets: 174 10*3/uL (ref 150–400)
RBC: 4.97 MIL/uL (ref 4.22–5.81)
RDW: 17 % — ABNORMAL HIGH (ref 11.5–15.5)
WBC: 11.3 10*3/uL — ABNORMAL HIGH (ref 4.0–10.5)
nRBC: 0 % (ref 0.0–0.2)

## 2022-04-21 LAB — CBG MONITORING, ED: Glucose-Capillary: 145 mg/dL — ABNORMAL HIGH (ref 70–99)

## 2022-04-21 LAB — TROPONIN I (HIGH SENSITIVITY)
Troponin I (High Sensitivity): 53 ng/L — ABNORMAL HIGH (ref <18)
Troponin I (High Sensitivity): 48 ng/L — ABNORMAL HIGH (ref <18)

## 2022-04-21 LAB — RESP PANEL BY RT-PCR (FLU A&B, COVID) ARPGX2
Influenza A by PCR: NEGATIVE
Influenza B by PCR: NEGATIVE
SARS Coronavirus 2 by RT PCR: NEGATIVE

## 2022-04-21 LAB — BRAIN NATRIURETIC PEPTIDE: B Natriuretic Peptide: 2341.6 pg/mL — ABNORMAL HIGH (ref 0.0–100.0)

## 2022-04-21 MED ORDER — OXYCODONE HCL 5 MG PO TABS
5.0000 mg | ORAL_TABLET | ORAL | Status: DC | PRN
  Administered 2022-04-22: 5 mg via ORAL
  Filled 2022-04-21: qty 1

## 2022-04-21 MED ORDER — ROSUVASTATIN CALCIUM 20 MG PO TABS: 20.0000 mg | ORAL_TABLET | Freq: Every morning | ORAL | Status: DC

## 2022-04-21 MED ORDER — ONDANSETRON HCL 4 MG PO TABS: 4.0000 mg | ORAL_TABLET | Freq: Four times a day (QID) | ORAL | Status: DC | PRN

## 2022-04-21 MED ORDER — LORAZEPAM 1 MG PO TABS
1.0000 mg | ORAL_TABLET | ORAL | Status: DC | PRN
  Administered 2022-04-22: 1 mg via ORAL
  Filled 2022-04-21: qty 1

## 2022-04-21 MED ORDER — HYDROMORPHONE HCL 1 MG/ML IJ SOLN: 0.5000 mg | INTRAMUSCULAR | Status: DC | PRN

## 2022-04-21 MED ORDER — LORAZEPAM 2 MG/ML IJ SOLN: 1.0000 mg | INTRAMUSCULAR | Status: DC | PRN

## 2022-04-21 MED ORDER — SODIUM CHLORIDE 0.9 % IV SOLN: 500.0000 mg | Freq: Every day | INTRAVENOUS | Status: DC

## 2022-04-21 MED ORDER — HALOPERIDOL LACTATE 5 MG/ML IJ SOLN: 0.5000 mg | INTRAMUSCULAR | Status: DC | PRN

## 2022-04-21 MED ORDER — METOPROLOL TARTRATE 5 MG/5ML IV SOLN
5.0000 mg | INTRAVENOUS | Status: DC | PRN
  Administered 2022-04-21: 5 mg via INTRAVENOUS
  Filled 2022-04-21: qty 5

## 2022-04-21 MED ORDER — APIXABAN 5 MG PO TABS
5.0000 mg | ORAL_TABLET | Freq: Two times a day (BID) | ORAL | Status: DC
  Filled 2022-04-21: qty 1

## 2022-04-21 MED ORDER — HALOPERIDOL LACTATE 2 MG/ML PO CONC
0.5000 mg | ORAL | Status: DC | PRN
  Filled 2022-04-21: qty 5

## 2022-04-21 MED ORDER — METOCLOPRAMIDE HCL 5 MG/ML IJ SOLN
INTRAMUSCULAR | Status: AC
  Administered 2022-04-22: 10 mg via INTRAVENOUS
  Filled 2022-04-21: qty 2

## 2022-04-21 MED ORDER — LEVOTHYROXINE SODIUM 25 MCG PO TABS: 50.0000 ug | ORAL_TABLET | Freq: Every day | ORAL | Status: DC

## 2022-04-21 MED ORDER — ONDANSETRON HCL 4 MG/2ML IJ SOLN
4.0000 mg | Freq: Four times a day (QID) | INTRAMUSCULAR | Status: DC | PRN
  Filled 2022-04-21: qty 2

## 2022-04-21 MED ORDER — ACETAMINOPHEN 650 MG RE SUPP: 650.0000 mg | Freq: Four times a day (QID) | RECTAL | Status: DC | PRN

## 2022-04-21 MED ORDER — SODIUM CHLORIDE 0.9 % IV SOLN
1.0000 g | Freq: Once | INTRAVENOUS | Status: AC
  Administered 2022-04-21: 1 g via INTRAVENOUS
  Filled 2022-04-21: qty 10

## 2022-04-21 MED ORDER — ACETAMINOPHEN 325 MG PO TABS
650.0000 mg | ORAL_TABLET | Freq: Four times a day (QID) | ORAL | Status: DC | PRN
  Administered 2022-04-23: 650 mg via ORAL
  Filled 2022-04-21: qty 2

## 2022-04-21 MED ORDER — GLYCOPYRROLATE 0.2 MG/ML IJ SOLN: 0.2000 mg | INTRAMUSCULAR | Status: DC | PRN

## 2022-04-21 MED ORDER — POLYVINYL ALCOHOL 1.4 % OP SOLN: 1.0000 [drp] | Freq: Four times a day (QID) | OPHTHALMIC | Status: DC | PRN

## 2022-04-21 MED ORDER — SENNOSIDES-DOCUSATE SODIUM 8.6-50 MG PO TABS: 1.0000 | ORAL_TABLET | Freq: Every evening | ORAL | Status: DC | PRN

## 2022-04-21 MED ORDER — GLYCOPYRROLATE 1 MG PO TABS: 1.0000 mg | ORAL_TABLET | ORAL | Status: DC | PRN

## 2022-04-21 MED ORDER — GUAIFENESIN ER 600 MG PO TB12
600.0000 mg | ORAL_TABLET | Freq: Two times a day (BID) | ORAL | Status: DC
  Filled 2022-04-21: qty 1

## 2022-04-21 MED ORDER — BIOTENE DRY MOUTH MT LIQD: 15.0000 mL | OROMUCOSAL | Status: DC | PRN

## 2022-04-21 MED ORDER — FUROSEMIDE 10 MG/ML IJ SOLN: 80.0000 mg | Freq: Two times a day (BID) | INTRAMUSCULAR | Status: DC

## 2022-04-21 MED ORDER — SODIUM CHLORIDE 0.9% FLUSH
3.0000 mL | Freq: Two times a day (BID) | INTRAVENOUS | Status: DC
  Administered 2022-04-22 – 2022-04-24 (×5): 3 mL via INTRAVENOUS

## 2022-04-21 MED ORDER — FUROSEMIDE 10 MG/ML IJ SOLN
80.0000 mg | Freq: Once | INTRAMUSCULAR | Status: AC
  Administered 2022-04-21: 80 mg via INTRAVENOUS
  Filled 2022-04-21: qty 8

## 2022-04-21 MED ORDER — HALOPERIDOL 0.5 MG PO TABS: 0.5000 mg | ORAL_TABLET | ORAL | Status: DC | PRN

## 2022-04-21 MED ORDER — SODIUM CHLORIDE 0.9 % IV SOLN: 2.0000 g | INTRAVENOUS | Status: DC

## 2022-04-21 MED ORDER — LORAZEPAM 2 MG/ML PO CONC: 1.0000 mg | ORAL | Status: DC | PRN

## 2022-04-21 MED ORDER — SODIUM CHLORIDE 0.9 % IV SOLN
500.0000 mg | Freq: Once | INTRAVENOUS | Status: AC
  Administered 2022-04-21: 500 mg via INTRAVENOUS
  Filled 2022-04-21: qty 5

## 2022-04-21 MED ORDER — METOPROLOL TARTRATE 25 MG PO TABS: 50.0000 mg | ORAL_TABLET | Freq: Two times a day (BID) | ORAL | Status: DC

## 2022-04-21 NOTE — H&P (Signed)
History and Physical    Hammad Finkler WAQ:[id number] DOB: 03-12-1930 DOA: 04/21/2022  PCP: [name], MD  Patient coming from: Home  I have personally briefly reviewed patient's old medical records in [place]  Chief Complaint: Shortness of breath  HPI: Edgar Thomas is a 88 y.o. male with medical history significant for HFrEF (EF 20-25% by TTE 03/09/2022), CAD s/p CABG, PAF on Eliquis, CKD stage IIIa, history of CVA, right femoral DVT, hypothyroidism who presented to the ED for evaluation of shortness of breath.  History is supplemented by spouse at bedside.  Patient has had 3 days of frequent cough productive of yellow sputum.  He has had associated shortness of breath and progressive lower extremity swelling.  He has been lethargic and generally weak.  He is mostly spending most of his time in the recliner and requires assistance from his spouse for ambulation, bathing, getting dressed, brushing teeth.  He has not had any chest pain.  Spouse notes that he does frequently appear to be choking on foods and liquids when eating/drinking.  He was complaining about abdominal pain recently and has felt nauseous.  ED Course  Labs/Imaging on admission: I have personally reviewed following labs and imaging studies.  Initial vitals showed BP 135/110, pulse 114, RR 21, temp 98.0 F, SpO2 98% on room air.  Labs show WBC 11.3, hemoglobin 16.2, platelets 174,000, sodium 144, potassium 4.2, bicarb 26, BUN 31, creatinine 1.52 (baseline 1.1-1.2), serum glucose 99, AST 33, ALT 23, alk phos 176, total bilirubin 1.7, BNP 2341.6.  SARS-CoV-2 and influenza PCR negative.  Blood cultures ordered and pending.  2 view chest x-ray showed pulmonary edema and bilateral trace pleural effusions.  CT abdomen/pelvis without contrast showed right lower lobe consolidation with associated effusion, mild left basilar atelectasis and small pleural effusion, stable bladder calculus.  Scattered simple appearing cyst  of the liver are seen again and stable from prior exam, gallbladder decompressed.  No acute intra-abdominal pathology noted.  Patient was given IV ceftriaxone and azithromycin, IV Lasix 80 mg.  The hospitalist service was consulted to admit for further evaluation and management.  Review of Systems:  All systems reviewed and are negative except as documented in history of present illness above.   Past Medical History:  Diagnosis Date   A-fib ([place])    Anemia    CAD (coronary artery disease)    Constipation    GERD (gastroesophageal reflux disease)    Heart attack ([place])    Heartburn    Hyperlipemia    Hypertension    Post herpetic neuralgia    Stroke (cerebrum) ([place]) 11/04/2020   Left lenticulostriate and external capsule   Stroke ([place])    TIA (transient ischemic attack)    Vitamin D deficiency     Past Surgical History:  Procedure Laterality Date   balloon angioplasty of LAD     CARDIOVERSION  2018   a fib   CATARACT EXTRACTION, BILATERAL  1019, 2022   COLONOSCOPY     CORONARY ARTERY BYPASS GRAFT N/A 12/03/2013   Procedure: CORONARY ARTERY BYPASS GRAFTING (CABG) x4 using left internal mammary artery and right greater saphenous vein. LIMA to LAD, sequential SVG to OM 1 & OM 2, SVG to PD;  Surgeon: Grace Isaac, MD;  Location: [facility];  Service: Open Heart Surgery;  Laterality: N/A;   ENDOVEIN HARVEST OF GREATER SAPHENOUS VEIN Right 12/03/2013   Procedure: ENDOVEIN HARVEST OF GREATER SAPHENOUS VEIN;  Surgeon: Grace Isaac, MD;  Location[facility];  Service: Open Heart Surgery;  Laterality: Right;   INTRAOPERATIVE TRANSESOPHAGEAL ECHOCARDIOGRAM N/A 12/03/2013   Procedure: INTRAOPERATIVE TRANSESOPHAGEAL ECHOCARDIOGRAM;  Surgeon: Grace Isaac, MD;  Location: [facility];  Service: Open Heart Surgery;  Laterality: N/A;    Social History:  reports that he has never smoked. He has never used smokeless tobacco. He reports that he does not drink alcohol and does not use drugs.  Allergies   Allergen Reactions   Contrast Media [Iodinated Contrast Media] Rash    19 years ago at [facility]   Metrizamide Rash    19 years ago at [facility] ([facility])   Other Other (See Comments)    Patient preference: NO MEAT OR DAIRY!!   Amiodarone Other (See Comments)    Makes the patient walk sideways and he falls   Ciprofloxacin Other (See Comments)    Was told by MD to "not take"   Tramadol Nausea And Vomiting    Family History  Problem Relation Age of Onset   Heart disease Mother        died at age 77 of heart attack   Leukemia Sister    Heart disease Brother    Hypertension Brother      Prior to Admission medications   Medication Sig Start Date End Date Taking? Authorizing Provider  apixaban (ELIQUIS) 5 MG TABS tablet Take 5 mg by mouth 2 (two) times daily.    [provider]  Cholecalciferol (VITAMIN D-3) 125 MCG (5000 UT) TABS Take 1 capsule by mouth daily after breakfast.    [provider]  CRESTOR 20 MG tablet Take 20 mg by mouth every morning.  08/26/14   [provider]  ferrous sulfate 325 (65 FE) MG tablet Take 325 mg by mouth daily with breakfast.    [provider]  furosemide (LASIX) 80 MG tablet Take 1 tablet (80 mg total) by mouth 2 (two) times daily. 03/12/22   [facility], Tami Lin, PA  JARDIANCE 10 MG TABS tablet Take 10 mg by mouth every morning. 05/10/21   [provider]  levothyroxine (SYNTHROID) 50 MCG tablet Take 50 mcg by mouth daily. 02/08/22   [provider]  metoprolol tartrate (LOPRESSOR) 50 MG tablet Take 1 tablet (50 mg total) by mouth 2 (two) times daily. 03/12/22   [facility], Tami Lin, PA  Multiple Vitamins-Minerals (ONE-A-DAY MENS 50+ ADVANTAGE) TABS Take 1 tablet by mouth daily with breakfast.     [provider]  nitroGLYCERIN (NITROSTAT) 0.4 MG SL tablet Place 0.4 mg under the tongue every 5 (five) minutes as needed for chest pain.    [provider]   ondansetron (ZOFRAN) 4 MG tablet Take 1 tablet (4 mg total) by mouth every 6 (six) hours. Patient taking differently: Take 4 mg by mouth as needed. 10/07/21   Blanchie Dessert, MD  potassium chloride (KLOR-CON) 10 MEQ tablet Take 1 tablet (10 mEq total) by mouth daily. 03/12/22   [facility], Tami Lin, PA  testosterone cypionate (DEPOTESTOSTERONE CYPIONATE) 200 MG/ML injection Inject 200 mg into the muscle every 28 (twenty-eight) days. Taking every 4 weeks 02/17/22   [provider]    Physical Exam: Vitals:   04/21/22 2126 04/21/22 2130 04/21/22 2215 04/21/22 2225  BP:  (!) 128/116 (!) 124/95   Pulse: (!) 154 (!) 125 (!) 114 (!) 116  Resp: (!) 21 (!) 23 (!) 23 (!) 24  Temp:      TempSrc:      SpO2:  97%  93% (!) 86%   Constitutional: Elderly man resting in bed with head elevated, appears fatigued, frequent cough Eyes: EOMI, lids and conjunctivae normal ENMT: Mucous membranes are moist. Posterior pharynx clear of any exudate or lesions.Normal dentition.  Neck: normal, supple, no masses. Respiratory: Inspiratory crackles and rhonchi right lower lung field. Normal respiratory effort. No accessory muscle use.  Cardiovascular: B irregular, tachycardic, no murmurs / rubs / gallops.  +3 bilateral lower extremity edema. Abdomen: no tenderness, no masses palpated.  Musculoskeletal: no clubbing / cyanosis. No joint deformity upper and lower extremities. Good ROM, no contractures. Normal muscle tone.  Skin: no rashes, lesions, ulcers. No induration Neurologic:  Sensation intact. Strength 5/5 in all 4.  Psychiatric: Alert and oriented to person, place, situation but not year.  EKG: Personally reviewed. Atrial fibrillation, rate 112, incomplete RBBB.  Similar to prior.  Assessment/Plan Principal Problem:   Community acquired pneumonia of right lower lobe of lung Active Problems:   Acute on chronic HFrEF (heart failure with reduced ejection fraction) (HCC)   Chronic atrial fibrillation  with rapid ventricular response (HCC)   Acute kidney injury superimposed on chronic kidney disease stage IIIa (HCC)   Coronary artery disease involving native coronary artery of native heart without angina pectoris   Hypothyroidism   Dysphagia   Generalized weakness   Edgar Thomas is a 88 y.o. male with medical history significant for HFrEF (EF 20-25% by TTE 03/09/2022), CAD s/p CABG, PAF on Eliquis, CKD stage IIIa, history of CVA, right femoral DVT, hypothyroidism who is admitted with right lower lobe pneumonia and acute on chronic HFrEF.  Assessment and Plan: * Community acquired pneumonia of right lower lobe of lung CT shows right lower lobe consolidation with associated effusion.  COVID and flu negative. -Continue IV ceftriaxone and azithromycin -Follow blood cultures, strep pneumonia urinary antigen, expectorated sputum -Supplemental oxygen as needed  Acute on chronic HFrEF (heart failure with reduced ejection fraction) (HCC) Volume overloaded with significant peripheral edema, pleural effusions, BNP >2300.  TTE 03/09/2022 showed EF 20-25%. -Continue IV Lasix 80 mg twice daily -Continue home Lopressor 50 mg twice daily -Hold Jardiance with AKI -Strict I/O's and daily weights  Chronic atrial fibrillation with rapid ventricular response ([place]) Remains in atrial fibrillation with fluctuating rates 110-140s. -Resume home Lopressor 50 mg twice daily -IV metoprolol pr for sustained HR >120n -Continue Eliquis  Acute kidney injury superimposed on chronic kidney disease stage IIIa (HCC) Creatinine 1.52 compared to baseline 1.1-1.2.  Likely prerenal secondary to acute on chronic CHF. -Continue diuresis and monitor renal function  Coronary artery disease involving native coronary artery of native heart without angina pectoris Denies chest pain.  Minimal troponin elevation 53 > 48 secondary to demand ischemia due to pneumonia, acute on chronic CHF, A-fib with RVR. -Continue Eliquis,  rosuvastatin, metoprolol  Generalized weakness Deconditioned due to acute on chronic illnesses, immobility, advanced age. -PT/OT eval -Fall precautions  Dysphagia Spouse reports dysphagia with foods and liquids. -Aspiration precautions -SLP eval  Hypothyroidism Continue Synthroid.  DVT prophylaxis:  apixaban (ELIQUIS) tablet 5 mg   Code Status: DNR, confirmed with spouse at bedside on admission Family Communication: Spouse at bedside Disposition Plan: From home, dispo pending clinical progress Consults called: None Severity of Illness: The appropriate patient status for this patient is INPATIENT. Inpatient status is judged to be reasonable and necessary in order to provide the required intensity of service to ensure the patient's safety. The patient's presenting symptoms, physical exam findings, and initial radiographic and laboratory data in  the context of their chronic comorbidities is felt to place them at high risk for further clinical deterioration. Furthermore, it is not anticipated that the patient will be medically stable for discharge from the hospital within 2 midnights of admission.   * I certify that at the point of admission it is my clinical judgment that the patient will require inpatient hospital care spanning beyond 2 midnights from the point of admission due to high intensity of service, high risk for further deterioration and high frequency of surveillance required.Zada Finders MD [facility]  If 7PM-7AM, please contact night-coverage [email or web address]  04/21/2022, 10:30 PM

## 2022-04-21 NOTE — ED Notes (Signed)
Admitting paged 

## 2022-04-21 NOTE — Assessment & Plan Note (Signed)
Creatinine 1.52 compared to baseline 1.1-1.2.  Likely prerenal secondary to acute on chronic CHF. -Continue diuresis and monitor renal function

## 2022-04-21 NOTE — Assessment & Plan Note (Signed)
Continue Synthroid °

## 2022-04-21 NOTE — Assessment & Plan Note (Signed)
Spouse reports dysphagia with foods and liquids. -Aspiration precautions -SLP eval

## 2022-04-21 NOTE — Assessment & Plan Note (Signed)
Deconditioned due to acute on chronic illnesses, immobility, advanced age. -PT/OT eval -Fall precautions

## 2022-04-21 NOTE — Assessment & Plan Note (Signed)
Denies chest pain.  Minimal troponin elevation 53 > 48 secondary to demand ischemia due to pneumonia, acute on chronic CHF, A-fib with RVR. -Continue Eliquis, rosuvastatin, metoprolol

## 2022-04-21 NOTE — ED Provider Notes (Signed)
[facility] EMERGENCY DEPARTMENT Provider Note   CSN: [id number] Arrival date & time: 04/21/22  1812     History  Chief Complaint  Patient presents with   Cough   Leg Swelling    Edgar Thomas is a 88 y.o. male.  He is brought in by his wife for evaluation of cough shortness of breath and been going on for about a week.  No fever.  She also noticed he was complaining of some abdominal pain and felt a mass in his abdominal wall.  He said he has brought up some phlegm at times but does not know what color.  He has significant heart disease and A-fib on anticoagulation, CHF with low ejection fraction.  The history is provided by the patient and the spouse.  Cough Cough characteristics:  Productive Onset quality:  Gradual Duration:  1 week Timing:  Intermittent Progression:  Unchanged Chronicity:  New Smoker: no   Relieved by:  None tried Worsened by:  Activity Ineffective treatments:  None tried Associated symptoms: shortness of breath   Associated symptoms: no chest pain and no fever        Home Medications Prior to Admission medications   Medication Sig Start Date End Date Taking? Authorizing Provider  apixaban (ELIQUIS) 5 MG TABS tablet Take 5 mg by mouth 2 (two) times daily.    [provider]  Cholecalciferol (VITAMIN D-3) 125 MCG (5000 UT) TABS Take 1 capsule by mouth daily after breakfast.    [provider]  CRESTOR 20 MG tablet Take 20 mg by mouth every morning.  08/26/14   [provider]  ferrous sulfate 325 (65 FE) MG tablet Take 325 mg by mouth daily with breakfast.    [provider]  furosemide (LASIX) 80 MG tablet Take 1 tablet (80 mg total) by mouth 2 (two) times daily. 03/12/22   Duke, Roe Rutherford, PA  JARDIANCE 10 MG TABS tablet Take 10 mg by mouth every morning. 05/10/21   [provider]  levothyroxine (SYNTHROID) 50 MCG tablet Take 50 mcg by mouth daily. 02/08/22   [provider]   metoprolol tartrate (LOPRESSOR) 50 MG tablet Take 1 tablet (50 mg total) by mouth 2 (two) times daily. 03/12/22   D[facility], Roe Rutherford, PA  Multiple Vitamins-Minerals (ONE-A-DAY MENS 50+ ADVANTAGE) TABS Take 1 tablet by mouth daily with breakfast.     [provider]  nitroGLYCERIN (NITROSTAT) 0.4 MG SL tablet Place 0.4 mg under the tongue every 5 (five) minutes as needed for chest pain.    [provider]  ondansetron (ZOFRAN) 4 MG tablet Take 1 tablet (4 mg total) by mouth every 6 (six) hours. Patient taking differently: Take 4 mg by mouth as needed. 10/07/21   Gwyneth Sprout, MD  potassium chloride (KLOR-CON) 10 MEQ tablet Take 1 tablet (10 mEq total) by mouth daily. 03/12/22   Duke, Roe Rutherford, PA  testosterone cypionate (DEPOTESTOSTERONE CYPIONATE) 200 MG/ML injection Inject 200 mg into the muscle every 28 (twenty-eight) days. Taking every 4 weeks 02/17/22   [provider]      Allergies    Contrast media [iodinated contrast media], Metrizamide, Other, Amiodarone, Ciprofloxacin, and Tramadol    Review of Systems   Review of Systems  Constitutional:  Negative for fever.  Respiratory:  Positive for cough and shortness of breath.   Cardiovascular:  Positive for leg swelling. Negative for chest pain.  Gastrointestinal:  Positive for abdominal pain.    Physical Exam Updated Vital  Signs BP (!) 130/104   Pulse (!) 154   Temp 98 F (36.7 C) (Oral)   Resp (!) 21   SpO2 98%  Physical Exam Vitals and nursing note reviewed.  Constitutional:      General: He is not in acute distress.    Appearance: Normal appearance. He is well-developed. He is ill-appearing.  HENT:     Head: Normocephalic and atraumatic.  Eyes:     Conjunctiva/sclera: Conjunctivae normal.  Cardiovascular:     Rate and Rhythm: Tachycardia present. Rhythm irregular.     Heart sounds: No murmur heard. Pulmonary:     Effort: Pulmonary effort is normal. No respiratory distress.      Breath sounds: Rhonchi present.  Abdominal:     Palpations: Abdomen is soft.     Tenderness: There is no abdominal tenderness. There is no guarding or rebound.  Musculoskeletal:     Cervical back: Neck supple.     Right lower leg: Edema present.     Left lower leg: Edema present.  Skin:    General: Skin is dry.     Capillary Refill: Capillary refill takes less than 2 seconds.  Neurological:     General: No focal deficit present.     ED Results / Procedures / Treatments   Labs (all labs ordered are listed, but only abnormal results are displayed) Labs Reviewed  CBC - Abnormal; Notable for the following components:      Result Value   WBC 11.3 (*)    RDW 17.0 (*)    All other components within normal limits  COMPREHENSIVE METABOLIC PANEL - Abnormal; Notable for the following components:   BUN 31 (*)    Creatinine, Ser 1.52 (*)    Alkaline Phosphatase 176 (*)    Total Bilirubin 1.7 (*)    GFR, Estimated 43 (*)    All other components within normal limits  BRAIN NATRIURETIC PEPTIDE - Abnormal; Notable for the following components:   B Natriuretic Peptide 2,341.6 (*)    All other components within normal limits  TROPONIN I (HIGH SENSITIVITY) - Abnormal; Notable for the following components:   Troponin I (High Sensitivity) 53 (*)    All other components within normal limits  RESP PANEL BY RT-PCR (FLU A&B, COVID) ARPGX2  TROPONIN I (HIGH SENSITIVITY)    EKG EKG Interpretation  Date/Time:  Friday April 21 2022 18:25:10 EST Ventricular Rate:  112 PR Interval:    QRS Duration: 106 QT Interval:  370 QTC Calculation: 505 R Axis:   186 Text Interpretation: Atrial fibrillation with rapid ventricular response Right superior axis deviation Incomplete right bundle branch block Right ventricular hypertrophy Anterior infarct , age undetermined Abnormal ECG When compared with ECG of 18-May-2021 22:20, axis change, will repeat Confirmed by Meridee Score [phone]) on 04/21/2022  9:13:33 PM  Radiology CT ABDOMEN PELVIS WO CONTRAST  Result Date: 04/21/2022 CLINICAL DATA:  Acute abdominal pain for several days EXAM: CT ABDOMEN AND PELVIS WITHOUT CONTRAST TECHNIQUE: Multidetector CT imaging of the abdomen and pelvis was performed following the standard protocol without IV contrast. RADIATION DOSE REDUCTION: This exam was performed according to the departmental dose-optimization program which includes automated exposure control, adjustment of the mA and/or kV according to patient size and/or use of iterative reconstruction technique. COMPARISON:  05/02/2021 FINDINGS: Lower chest: Right-sided pleural effusion and right lower lobe consolidation are noted. Minimal left basilar atelectasis is seen with small effusion. Hepatobiliary: Liver again demonstrates scattered simple appearing cysts stable from the prior exam. Gallbladder  is decompressed. Scattered calcified granulomas are noted throughout the liver stable from the prior study. Pancreas: Diffuse atrophy is again noted. No pancreatic ductal dilatation or surrounding inflammatory changes. Spleen: Normal in size without focal abnormality. Adrenals/Urinary Tract: Adrenal glands are within normal limits. Kidneys are well visualized bilaterally. No renal calculi or obstructive changes are seen. Bladder is partially distended. A large bladder calculus is noted within measuring up to 2.1 cm. This is stable from the prior exam. Stomach/Bowel: Scattered fecal material is noted throughout the colon. The appendix is not discretely visualized. Small bowel and stomach are within normal limits. Vascular/Lymphatic: Diffuse atherosclerotic calcifications are noted. No lymphadenopathy is seen. Reproductive: Prostate is unremarkable. Other: Minimal free fluid is noted within the pelvis. No definitive herniation is seen. Musculoskeletal: Degenerative changes of lumbar spine are noted. Old rib fractures are seen on the right. Chronic appearing T12  compression fracture is noted. Degenerative anterolisthesis of L4 on L5 is seen. IMPRESSION: Right lower lobe infiltrate with associated effusion. Mild left basilar atelectasis and small pleural effusion. Stable bladder calculus. Chronic changes as described above. Electronically Signed   By: Alcide CleverMark  Lukens M.D.   On: 04/21/2022 21:15   DG Chest 2 View  Result Date: 04/21/2022 CLINICAL DATA:  cough, sob EXAM: CHEST - 2 VIEW COMPARISON:  Chest x-ray 03/11/2022, CT chest 05/21/2021, chest x-ray 03/08/2022 FINDINGS: Stable enlarged cardiac silhouette. The heart and mediastinal contours are unchanged. Atherosclerotic plaque. Surgical changes overlie the mediastinum. Limited evaluation of the lung apices due to overlying mandible. No focal consolidation. No pulmonary edema. Chronic coarsened interstitial markings with superimposed mild pulmonary edema. Persistent perihilar nodular interstitial markings. At least bilateral trace pleural effusions. No pneumothorax. No acute osseous abnormality.  Sternotomy wires are intact. IMPRESSION: 1. At least bilateral trace pleural effusions. 2. Mild pulmonary edema. Electronically Signed   By: Tish FredericksonMorgane  Naveau M.D.   On: 04/21/2022 19:56    Procedures Procedures    Medications Ordered in ED Medications  cefTRIAXone (ROCEPHIN) 1 g in sodium chloride 0.9 % 100 mL IVPB (has no administration in time range)  azithromycin (ZITHROMAX) 500 mg in sodium chloride 0.9 % 250 mL IVPB (has no administration in time range)    ED Course/ Medical Decision Making/ A&P Clinical Course as of 04/22/22 1008  Fri Apr 21, 2022  2155 Discussed with Dr. Allena KatzPatel from [facility] who will evaluate patient for admission. [MB]    Clinical Course User Index [MB] Terrilee FilesButler, Jihad Brownlow C, MD                           Medical Decision Making Risk Prescription drug management. Decision regarding hospitalization.   This patient complains of cough shortness of breath fatigue abdominal pain;  this involves an extensive number of treatment Options and is a complaint that carries with it a high risk of complications and morbidity. The differential includes pneumonia, aspiration, CHF, COPD, pneumothorax, PE, arrhythmia  I ordered, reviewed and interpreted labs, which included CBC with slightly elevated white count stable hemoglobin, chemistries with some chronic CKD, troponins elevated but flat, BNP elevated, COVID and flu negative I ordered medication IV antibiotics IV Lasix and reviewed PMP when indicated. I ordered imaging studies which included chest x-ray and CT abdomen and pelvis and I independently    visualized and interpreted imaging which showed right lower lobe pneumonia Additional history obtained from patient's wife Previous records obtained and reviewed in epic including prior cardiology notes I consulted Dr. Allena KatzPatel  [facility] and discussed lab and imaging findings and discussed disposition.  Cardiac monitoring reviewed, A-fib with RVR Social determinants considered, no significant barriers Critical Interventions: None  After the interventions stated above, I reevaluated the patient and found patient to be with increased work of breathing and cough very tired and weak appearing Admission and further testing considered, he would benefit from admission to the hospital for further management.  Patient and wife in agreement with plan for admission.         Final Clinical Impression(s) / ED Diagnoses Final diagnoses:  Community acquired pneumonia of right lower lobe of lung  Acute on chronic congestive heart failure, unspecified heart failure type (HCC)  Atrial fibrillation with rapid ventricular response [facility])    Rx / DC Orders ED Discharge Orders     None         Terrilee Files, MD 04/22/22 1015

## 2022-04-21 NOTE — Assessment & Plan Note (Signed)
Remains in atrial fibrillation with fluctuating rates 110-140s. -Resume home Lopressor 50 mg twice daily -IV metoprolol pr for sustained HR >120n -Continue Eliquis

## 2022-04-21 NOTE — Hospital Course (Signed)
Edgar Thomas is a 88 y.o. male with medical history significant for HFrEF (EF 20-25% by TTE 03/09/2022), CAD s/p CABG, PAF on Eliquis, CKD stage IIIa, history of CVA, right femoral DVT, hypothyroidism who is admitted with right lower lobe pneumonia and acute on chronic HFrEF.

## 2022-04-21 NOTE — ED Provider Triage Note (Signed)
Emergency Medicine Provider Triage Evaluation Note  Edgar Thomas , a 89 y.o. male  was evaluated in triage.  Review of records demonstrate history of coronary artery disease, atrial fibrillation, TIA/stroke, ischemic cardiomyopathy.  Patient's wife at bedside states that patient has had cough, decreased appetite, upper abdominal pain starting several days ago.  He actually came to the emergency department yesterday, however he would not come in.  They noticed a knot in the upper abdomen.  He has eaten a little bit of soft food today.  He has lower extremity swelling, but not as bad as when he is having a heart failure exacerbation.   Review of Systems  Positive: Cough, abdominal pain and swelling Negative: Fever  Physical Exam  BP (!) 135/110 (BP Location: Right Arm)   Pulse (!) 114   Temp 98 F (36.7 C) (Oral)   Resp (!) 21   SpO2 98%  Gen:   Awake, no distress   Resp:  Normal effort  MSK:   Moves extremities without difficulty  Other:  Small reducible umbilical hernia, no abdominal mass.  Lower extremity edema noted without signs of cellulitis.  Patient with rhonchorous cough.    Medical Decision Making  Medically screening exam initiated at 6:19 PM.  Appropriate orders placed.  Sherif Isaacson was informed that the remainder of the evaluation will be completed by another provider, this initial triage assessment does not replace that evaluation, and the importance of remaining in the ED until their evaluation is complete.  Renne Crigler, PA-C 04/21/22 1849

## 2022-04-21 NOTE — Assessment & Plan Note (Signed)
CT shows right lower lobe consolidation with associated effusion.  COVID and flu negative. -Continue IV ceftriaxone and azithromycin -Follow blood cultures, strep pneumonia urinary antigen, expectorated sputum -Supplemental oxygen as needed

## 2022-04-21 NOTE — ED Notes (Signed)
Chaplain at bedside with wife and patient

## 2022-04-21 NOTE — ED Triage Notes (Signed)
Pt a poor historian and denies any complaints.  Pt has visible pitting edema to bilateral LEs and wet cough noted at time of triage.  Pt is tachypneic as well.

## 2022-04-21 NOTE — ED Notes (Signed)
Went in to patient's room to administer IV Metoprolol for heart rate of 135. Medication administered and at this time patient appears to be gagging on secretions and then started having snoring respirations. ED attending Charm Barges, MD to bedside immediately.

## 2022-04-21 NOTE — ED Notes (Signed)
Admitting MD present at bedside at this time. MD aware of patient's heart rate going from low 100s up to 150s, was told Metoprolol will be placed. Patient denies any needs at this time.

## 2022-04-21 NOTE — Assessment & Plan Note (Signed)
Volume overloaded with significant peripheral edema, pleural effusions, BNP >2300.  TTE 03/09/2022 showed EF 20-25%. -Continue IV Lasix 80 mg twice daily -Continue home Lopressor 50 mg twice daily -Hold Jardiance with AKI -Strict I/O's and daily weights

## 2022-04-21 NOTE — ED Notes (Signed)
Patient unresponsive, breathing heavy, non-verbal, HR 107. ED doctor called to room assess patient.

## 2022-04-21 NOTE — ED Notes (Signed)
Admitting provider Allena Katz, MD to bedside to evaluate patient. Provider went over plan of care with both wife and patient and stated that patient will be moved to comfort care measures and ensure he is comfortable during his stay.

## 2022-04-21 NOTE — Significant Event (Signed)
Notified by RN that patient decompensated with unresponsive episode, heavy breathing, nonverbal status, and hypotension.  Patient seen at bedside.  He is minimally arousable with agonal breathing, excess oral secretions with hiccoughs.  Blood pressure has been labile.  Spouse is at bedside.  DNR status is confirmed.  At this time he it appears he is approaching end-of-life.  I discussed with his spouse at bedside, 3 children by phone, as well as patient's close physician friend.  All are in agreement to transition to comfort care measures which are being initiated.  Darreld Mclean, MD [facility]

## 2022-04-22 DIAGNOSIS — J189 Pneumonia, unspecified organism: Secondary | ICD-10-CM | POA: Diagnosis not present

## 2022-04-22 LAB — COMPREHENSIVE METABOLIC PANEL
ALT: 23 U/L (ref 0–44)
AST: 37 U/L (ref 15–41)
Albumin: 3.1 g/dL — ABNORMAL LOW (ref 3.5–5.0)
Alkaline Phosphatase: 168 U/L — ABNORMAL HIGH (ref 38–126)
Anion gap: 15 (ref 5–15)
BUN: 32 mg/dL — ABNORMAL HIGH (ref 8–23)
CO2: 24 mmol/L (ref 22–32)
Calcium: 8.6 mg/dL — ABNORMAL LOW (ref 8.9–10.3)
Chloride: 104 mmol/L (ref 98–111)
Creatinine, Ser: 1.53 mg/dL — ABNORMAL HIGH (ref 0.61–1.24)
GFR, Estimated: 43 mL/min — ABNORMAL LOW (ref >60)
Glucose, Bld: 114 mg/dL — ABNORMAL HIGH (ref 70–99)
Potassium: 4.3 mmol/L (ref 3.5–5.1)
Sodium: 143 mmol/L (ref 135–145)
Total Bilirubin: 1.5 mg/dL — ABNORMAL HIGH (ref 0.3–1.2)
Total Protein: 6.6 g/dL (ref 6.5–8.1)

## 2022-04-22 LAB — CBC
HCT: 45 % (ref 39.0–52.0)
Hemoglobin: 15.2 g/dL (ref 13.0–17.0)
MCH: 34 pg (ref 26.0–34.0)
MCHC: 33.8 g/dL (ref 30.0–36.0)
MCV: 100.7 fL — ABNORMAL HIGH (ref 80.0–100.0)
Platelets: 154 10*3/uL (ref 150–400)
RBC: 4.47 MIL/uL (ref 4.22–5.81)
RDW: 16.7 % — ABNORMAL HIGH (ref 11.5–15.5)
WBC: 10.3 10*3/uL (ref 4.0–10.5)
nRBC: 0 % (ref 0.0–0.2)

## 2022-04-22 LAB — GLUCOSE, CAPILLARY: Glucose-Capillary: 163 mg/dL — ABNORMAL HIGH (ref 70–99)

## 2022-04-22 LAB — TSH: TSH: 4.157 u[IU]/mL (ref 0.350–4.500)

## 2022-04-22 MED ORDER — SODIUM CHLORIDE 0.9 % IV SOLN: 500.0000 mg | INTRAVENOUS | Status: DC

## 2022-04-22 MED ORDER — SODIUM CHLORIDE 0.9 % IV SOLN
1.0000 g | INTRAVENOUS | Status: DC
  Administered 2022-04-22 – 2022-04-23 (×2): 1 g via INTRAVENOUS
  Filled 2022-04-22 (×2): qty 10

## 2022-04-22 MED ORDER — LEVOTHYROXINE SODIUM 50 MCG PO TABS
50.0000 ug | ORAL_TABLET | Freq: Every day | ORAL | Status: DC
  Administered 2022-04-22 – 2022-04-24 (×2): 50 ug via ORAL
  Filled 2022-04-22: qty 2
  Filled 2022-04-22: qty 1

## 2022-04-22 MED ORDER — ROSUVASTATIN CALCIUM 20 MG PO TABS
20.0000 mg | ORAL_TABLET | Freq: Every morning | ORAL | Status: DC
  Administered 2022-04-22: 20 mg via ORAL
  Filled 2022-04-22 (×2): qty 1

## 2022-04-22 MED ORDER — FERROUS SULFATE 325 (65 FE) MG PO TABS
325.0000 mg | ORAL_TABLET | Freq: Every day | ORAL | Status: DC
  Filled 2022-04-22: qty 1

## 2022-04-22 MED ORDER — MIDODRINE HCL 5 MG PO TABS
10.0000 mg | ORAL_TABLET | Freq: Three times a day (TID) | ORAL | Status: DC
  Administered 2022-04-22 – 2022-04-24 (×6): 10 mg via ORAL
  Filled 2022-04-22 (×6): qty 2

## 2022-04-22 MED ORDER — SODIUM CHLORIDE 0.9 % IV SOLN
500.0000 mg | INTRAVENOUS | Status: DC
  Administered 2022-04-22 – 2022-04-23 (×2): 500 mg via INTRAVENOUS
  Filled 2022-04-22 (×3): qty 5

## 2022-04-22 MED ORDER — LACTATED RINGERS IV BOLUS
250.0000 mL | Freq: Once | INTRAVENOUS | Status: AC
  Administered 2022-04-22: 250 mL via INTRAVENOUS

## 2022-04-22 MED ORDER — FUROSEMIDE 10 MG/ML IJ SOLN
40.0000 mg | Freq: Two times a day (BID) | INTRAMUSCULAR | Status: DC
  Administered 2022-04-22: 40 mg via INTRAVENOUS
  Filled 2022-04-22: qty 4

## 2022-04-22 MED ORDER — METOPROLOL TARTRATE 50 MG PO TABS
50.0000 mg | ORAL_TABLET | Freq: Two times a day (BID) | ORAL | Status: DC
  Administered 2022-04-22: 50 mg via ORAL
  Filled 2022-04-22: qty 2
  Filled 2022-04-22: qty 1

## 2022-04-22 MED ORDER — METOCLOPRAMIDE HCL 5 MG/ML IJ SOLN: 10.0000 mg | Freq: Once | INTRAMUSCULAR | Status: AC

## 2022-04-22 MED ORDER — APIXABAN 5 MG PO TABS
5.0000 mg | ORAL_TABLET | Freq: Two times a day (BID) | ORAL | Status: DC
  Administered 2022-04-22 – 2022-04-23 (×3): 5 mg via ORAL
  Filled 2022-04-22 (×3): qty 1

## 2022-04-22 NOTE — ED Notes (Signed)
Wife at bedside, Patient resting, no s/s of pain or distress. Will continue to monitor

## 2022-04-22 NOTE — Progress Notes (Signed)
PROGRESS NOTE  Chrsitopher Thomas DUK:[id number] DOB: 01-30-1930 DOA: 04/21/2022 PCP: Simone Curia, MD   LOS: 1 day   Brief Narrative / Interim history: 88 year old male with history of dementia, chronic systolic CHF, CAD status post CABG, PAF on Eliquis, CKD 3A, prior CVA, prior DVT, hypothyroidism who comes to the hospital with shortness of breath.  He has been having few days of being less interactive, frequent cough, and worsening lower extremity swelling.  He was found to have pneumonia and have fluid overload.  Upon admission into the ED he was given antibiotics and diuretics, however had an event when he became poorly responsive, appeared to have agonal breathing and he was transitioned to full comfort care.  Following that, he spontaneously woke up feeling better.  Subjective / 24h Interval events: He seems to be doing well this morning, he is alert, in bed.  Wife and son are at bedside.  Assesement and Plan: Principal Problem:   Community acquired pneumonia of right lower lobe of lung Active Problems:   Acute on chronic HFrEF (heart failure with reduced ejection fraction) (HCC)   Chronic atrial fibrillation with rapid ventricular response (HCC)   Acute kidney injury superimposed on chronic kidney disease stage IIIa (HCC)   Coronary artery disease involving native coronary artery of native heart without angina pectoris   Hypothyroidism   Dysphagia   Generalized weakness  Principal problem Community-acquired pneumonia of right lower lung lobe-has been placed on ceftriaxone and azithromycin, continue.  Continue to monitor cultures.  Respiratory status appears to be stable and he is on room air  Active problems Acute on chronic systolic CHF-most recent 2D echo done October 2023 shows an EF of 20-25%, global hypokinesis.  RV was normal.  He has evidence of fluid overload on imaging, continue IV Lasix.  Monitor renal function  Acute kidney injury on chronic kidney disease stage IIIa-most  recent creatinine 1.1 in October, 1.5 yesterday on admission.  Repeat renal function pending this morning.  Cautiously continue Lasix  Coronary artery disease with history of CABG-no chest pain, appears stable, continue Toprol, statin  PAF-rate controlled, monitor on telemetry.  Continue metoprolol, Eliquis  Hyperlipidemia-continue statin  Troponin elevation-flat, not in a pattern consistent with ACS, likely demand ischemia  Hypothyroidism-continue Synthroid, check TSH  Dysphagia-spouse reports dysphagia with foods and liquids.  Speech consulted.  This may be his main issue at this point, I suspect when he decompensated overnight last night he may have had an aspiration event versus mucous plugging.  This was discussed in detail with the family at bedside that he is at further risk for sudden decompensation if he has a major aspiration event, and family expressed understanding  Goals of care-patient was transitioned to full comfort overnight 11/24 after becoming unresponsive and appeared to have agonal breathing.  After that he suddenly woke up, he is alert, conversant.  Given this change, family wishes to treat with antibiotics and diuretics  Mild memory problems-monitor, at risk for in-hospital delirium  Scheduled Meds:  apixaban  5 mg Oral BID   furosemide  40 mg Intravenous Q12H   levothyroxine  50 mcg Oral Q0600   metoprolol tartrate  50 mg Oral BID   rosuvastatin  20 mg Oral q morning   sodium chloride flush  3 mL Intravenous Q12H   Continuous Infusions:  azithromycin     cefTRIAXone (ROCEPHIN)  IV     PRN Meds:.acetaminophen **OR** acetaminophen, antiseptic oral rinse, glycopyrrolate **OR** glycopyrrolate **OR** glycopyrrolate, haloperidol **OR** haloperidol **OR** haloperidol  lactate, HYDROmorphone (DILAUDID) injection, LORazepam **OR** LORazepam **OR** LORazepam, ondansetron **OR** ondansetron (ZOFRAN) IV, oxyCODONE, polyvinyl alcohol, senna-docusate  Current Outpatient  Medications  Medication Instructions   Cholecalciferol (VITAMIN D-3) 125 MCG (5000 UT) TABS 1 capsule, Oral, Daily after breakfast   Crestor 20 mg, Oral, Every morning   Eliquis 5 mg, Oral, 2 times daily   ferrous sulfate 325 mg, Oral, Daily with breakfast   furosemide (LASIX) 80 mg, Oral, 2 times daily   Jardiance 10 mg, Oral, Every morning   levothyroxine (SYNTHROID) 50 mcg, Oral, Daily   metoprolol tartrate (LOPRESSOR) 50 mg, Oral, 2 times daily   Multiple Vitamins-Minerals (ONE-A-DAY MENS 50+ ADVANTAGE) TABS 1 tablet, Oral, Daily with breakfast   nitroGLYCERIN (NITROSTAT) 0.4 mg, Sublingual, Every 5 min PRN   ondansetron (ZOFRAN) 4 mg, Oral, Every 6 hours   potassium chloride (KLOR-CON) 10 MEQ tablet 10 mEq, Oral, Daily   testosterone cypionate (DEPOTESTOSTERONE CYPIONATE) 200 mg, Intramuscular, Every 28 days, Taking every 4 weeks    DVT prophylaxis:  apixaban (ELIQUIS) tablet 5 mg   Lab Results  Component Value Date   PLT 174 04/21/2022      Code Status: DNR  Family Communication: spouse and son at bedside  Status is: Inpatient  Remains inpatient appropriate because: severity of illness  Level of care: Telemetry Medical  Consultants:  none  Objective: Vitals:   04/21/22 2330 04/22/22 0349 04/22/22 0350 04/22/22 0803  BP: 101/67 94/64 94/64  116/84  Pulse: 99 (!) 22 (!) 102 (!) 102  Resp: (!) 26 20 20  (!) 23  Temp:  98.1 F (36.7 C)    TempSrc:  Oral    SpO2: 96% 95% 90% 95%    Intake/Output Summary (Last 24 hours) at 04/22/2022 0908 Last data filed at 04/21/2022 2221 Gross per 24 hour  Intake 100 ml  Output --  Net 100 ml   Wt Readings from Last 3 Encounters:  03/23/22 67.6 kg  03/12/22 68.4 kg  03/08/22 73.9 kg    Examination:  Constitutional: NAD Eyes: no scleral icterus ENMT: Mucous membranes are moist.  Neck: normal, supple Respiratory: Bibasilar crackles, diminished, no wheezing heard Cardiovascular: Appears regular, 2+ pitting  edema Abdomen: non distended, no tenderness. Bowel sounds positive.  Musculoskeletal: no clubbing / cyanosis.  Skin: no rashes Neurologic: non focal   Data Reviewed: I have independently reviewed following labs and imaging studies   CBC Recent Labs  Lab 04/21/22 1855  WBC 11.3*  HGB 16.2  HCT 49.2  PLT 174  MCV 99.0  MCH 32.6  MCHC 32.9  RDW 17.0*    Recent Labs  Lab 04/21/22 1855  NA 144  K 4.2  CL 103  CO2 26  GLUCOSE 99  BUN 31*  CREATININE 1.52*  CALCIUM 9.6  AST 33  ALT 23  ALKPHOS 176*  BILITOT 1.7*  ALBUMIN 3.7  BNP 2,341.6*    ------------------------------------------------------------------------------------------------------------------ No results for input(s): "CHOL", "HDL", "LDLCALC", "TRIG", "CHOLHDL", "LDLDIRECT" in the last 72 hours.  Lab Results  Component Value Date   HGBA1C 5.0 03/08/2022   ------------------------------------------------------------------------------------------------------------------ No results for input(s): "TSH", "T4TOTAL", "T3FREE", "THYROIDAB" in the last 72 hours.  Invalid input(s): "FREET3"  Cardiac Enzymes No results for input(s): "CKMB", "TROPONINI", "MYOGLOBIN" in the last 168 hours.  Invalid input(s): "CK" ------------------------------------------------------------------------------------------------------------------    Component Value Date/Time   BNP 2,341.6 (H) 04/21/2022 1855    CBG: Recent Labs  Lab 04/21/22 2314  GLUCAP 145*    Recent Results (from the past 240 hour(s))  Resp Panel by RT-PCR (Flu A&B, Covid) Anterior Nasal Swab     Status: None   Collection Time: 04/21/22  6:12 PM   Specimen: Anterior Nasal Swab  Result Value Ref Range Status   SARS Coronavirus 2 by RT PCR NEGATIVE NEGATIVE Final    Comment: (NOTE) SARS-CoV-2 target nucleic acids are NOT DETECTED.  The SARS-CoV-2 RNA is generally detectable in upper respiratory specimens during the acute phase of infection. The  lowest concentration of SARS-CoV-2 viral copies this assay can detect is 138 copies/mL. A negative result does not preclude SARS-Cov-2 infection and should not be used as the sole basis for treatment or other patient management decisions. A negative result may occur with  improper specimen collection/handling, submission of specimen other than nasopharyngeal swab, presence of viral mutation(s) within the areas targeted by this assay, and inadequate number of viral copies(<138 copies/mL). A negative result must be combined with clinical observations, patient history, and epidemiological information. The expected result is Negative.  Fact Sheet for Patients:  EntrepreneurPulse.com.au  Fact Sheet for Healthcare Providers:  IncredibleEmployment.be  This test is no t yet approved or cleared by the [place] FDA and  has been authorized for detection and/or diagnosis of SARS-CoV-2 by FDA under an Emergency Use Authorization (EUA). This EUA will remain  in effect (meaning this test can be used) for the duration of the COVID-19 declaration under Section 564(b)(1) of the Act, 21 U.S.C.section 360bbb-3(b)(1), unless the authorization is terminated  or revoked sooner.       Influenza A by PCR NEGATIVE NEGATIVE Final   Influenza B by PCR NEGATIVE NEGATIVE Final    Comment: (NOTE) The Xpert Xpress SARS-CoV-2/FLU/RSV plus assay is intended as an aid in the diagnosis of influenza from Nasopharyngeal swab specimens and should not be used as a sole basis for treatment. Nasal washings and aspirates are unacceptable for Xpert Xpress SARS-CoV-2/FLU/RSV testing.  Fact Sheet for Patients: EntrepreneurPulse.com.au  Fact Sheet for Healthcare Providers: IncredibleEmployment.be  This test is not yet approved or cleared by the [place] FDA and has been authorized for detection and/or diagnosis of SARS-CoV-2 by FDA under  an Emergency Use Authorization (EUA). This EUA will remain in effect (meaning this test can be used) for the duration of the COVID-19 declaration under Section 564(b)(1) of the Act, 21 U.S.C. section 360bbb-3(b)(1), unless the authorization is terminated or revoked.  Performed at [facility], [place] [address] [place], [place] [address]   Culture, blood (routine x 2)     Status: None (Preliminary result)   Collection Time: 04/21/22  9:50 PM   Specimen: BLOOD LEFT FOREARM  Result Value Ref Range Status   Specimen Description BLOOD LEFT FOREARM  Final   Special Requests   Final    BOTTLES DRAWN AEROBIC AND ANAEROBIC Blood Culture adequate volume   Culture   Final    NO GROWTH < 12 HOURS Performed at [facility], [place] [address] [place], [place] [address]    Report Status PENDING  Incomplete  Culture, blood (routine x 2)     Status: None (Preliminary result)   Collection Time: 04/21/22  9:59 PM   Specimen: BLOOD RIGHT FOREARM  Result Value Ref Range Status   Specimen Description BLOOD RIGHT FOREARM  Final   Special Requests   Final    BOTTLES DRAWN AEROBIC AND ANAEROBIC Blood Culture results may not be optimal due to an inadequate volume of blood received in culture bottles   Culture  Final    NO GROWTH < 12 HOURS Performed at [facility] [address], [place], [place] [address]    Report Status PENDING  Incomplete     Radiology Studies: CT ABDOMEN PELVIS WO CONTRAST  Result Date: 04/21/2022 CLINICAL DATA:  Acute abdominal pain for several days EXAM: CT ABDOMEN AND PELVIS WITHOUT CONTRAST TECHNIQUE: Multidetector CT imaging of the abdomen and pelvis was performed following the standard protocol without IV contrast. RADIATION DOSE REDUCTION: This exam was performed according to the departmental dose-optimization program which includes automated exposure control, adjustment of the mA and/or kV according to patient size and/or use of iterative reconstruction  technique. COMPARISON:  05/02/2021 FINDINGS: Lower chest: Right-sided pleural effusion and right lower lobe consolidation are noted. Minimal left basilar atelectasis is seen with small effusion. Hepatobiliary: Liver again demonstrates scattered simple appearing cysts stable from the prior exam. Gallbladder is decompressed. Scattered calcified granulomas are noted throughout the liver stable from the prior study. Pancreas: Diffuse atrophy is again noted. No pancreatic ductal dilatation or surrounding inflammatory changes. Spleen: Normal in size without focal abnormality. Adrenals/Urinary Tract: Adrenal glands are within normal limits. Kidneys are well visualized bilaterally. No renal calculi or obstructive changes are seen. Bladder is partially distended. A large bladder calculus is noted within measuring up to 2.1 cm. This is stable from the prior exam. Stomach/Bowel: Scattered fecal material is noted throughout the colon. The appendix is not discretely visualized. Small bowel and stomach are within normal limits. Vascular/Lymphatic: Diffuse atherosclerotic calcifications are noted. No lymphadenopathy is seen. Reproductive: Prostate is unremarkable. Other: Minimal free fluid is noted within the pelvis. No definitive herniation is seen. Musculoskeletal: Degenerative changes of lumbar spine are noted. Old rib fractures are seen on the right. Chronic appearing T12 compression fracture is noted. Degenerative anterolisthesis of L4 on L5 is seen. IMPRESSION: Right lower lobe infiltrate with associated effusion. Mild left basilar atelectasis and small pleural effusion. Stable bladder calculus. Chronic changes as described above. Electronically Signed   By: Inez Catalina M.D.   On: 04/21/2022 21:15   DG Chest 2 View  Result Date: 04/21/2022 CLINICAL DATA:  cough, sob EXAM: CHEST - 2 VIEW COMPARISON:  Chest x-ray 03/11/2022, CT chest 05/21/2021, chest x-ray 03/08/2022 FINDINGS: Stable enlarged cardiac silhouette. The  heart and mediastinal contours are unchanged. Atherosclerotic plaque. Surgical changes overlie the mediastinum. Limited evaluation of the lung apices due to overlying mandible. No focal consolidation. No pulmonary edema. Chronic coarsened interstitial markings with superimposed mild pulmonary edema. Persistent perihilar nodular interstitial markings. At least bilateral trace pleural effusions. No pneumothorax. No acute osseous abnormality.  Sternotomy wires are intact. IMPRESSION: 1. At least bilateral trace pleural effusions. 2. Mild pulmonary edema. Electronically Signed   By: Iven Finn M.D.   On: 04/21/2022 19:56     Marzetta Board, MD, PhD [facility]  Between 7 am - 7 pm I am available, please contact me via Amion (for emergencies) or Securechat (non urgent messages)  Between 7 pm - 7 am I am not available, please contact night coverage MD/APP via Amion

## 2022-04-22 NOTE — Significant Event (Signed)
Rapid Response Event Note   Reason for Call :  Decreased LOC, bradycardia, hypotension  Pt is a DNR.  Pt was given 1mg  ativan PO at 2145. When RN went in to reassess, pt was unresponsive, bradycardic, and hypotensive.  Initial Focused Assessment:  Pt lying in bed with eyes closed, unresponsive. He is having periods of tachypnea vs apnea. Wife is at bedside, support given. Other family members on their way to the hospital.   HR-110, BP-84/49, RR-24 vs 0, SpO2-100% on 3L [place].  Interventions:  No RRT interventions needed.   Plan of Care:  Pt is declining. He is a DNR. Family at bedside. Keep pt comfortable.   Event Summary:   MD Notified:  Call [phone] Arrival [phone] End Time:2250  [id number], RN

## 2022-04-22 NOTE — Progress Notes (Signed)
    OVERNIGHT PROGRESS REPORT  (Remote coverage)  Notified by nursing for change in A&O +/- 15 min post Ativan. Initially BP and HR had dropped to 80s SBP and mis 50s HR.  Rapid response RN has evaluated.  Currently Vitals are : Bp: 100 SBP    90 MAP HR 92 RR 20-25   SPO2 100 on 3 LPM [place]        Chinita Greenland MSNA MSN ACNPC-AG Acute Care Nurse Practitioner [facility]

## 2022-04-22 NOTE — ED Notes (Signed)
Pt BP dropping down to 80s. MD made aware via secure chat.

## 2022-04-22 NOTE — Evaluation (Signed)
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 LIMA to LAD, sequential SVG to OM 1 & OM 2, SVG to PD;  Surgeon: Delight Ovens, MD;  Location[facility] OR;  Service: Open Heart Surgery;  Laterality: N/A;   ENDOVEIN HARVEST OF GREATER SAPHENOUS VEIN Right 12/03/2013   Procedure: ENDOVEIN HARVEST OF GREATER SAPHENOUS VEIN;  Surgeon: Delight Ovens, MD;  Location: [facility] OR;  Service: Open Heart Surgery;  Laterality: Right;   INTRAOPERATIVE TRANSESOPHAGEAL ECHOCARDIOGRAM N/A 12/03/2013   Procedure: INTRAOPERATIVE TRANSESOPHAGEAL ECHOCARDIOGRAM;  Surgeon: Delight Ovens, MD;  Location[facility];  Service: Open Heart Surgery;  Laterality: N/A;   HPI:  88 yo male adm to [facility] with respiratory deficits.  Pt found to have RLL CAP, Pt has h/o heart failure, Afib on eliquis- now with incr troponins.  Overnight he decompensated and had agonal respirations with excessive oral secretions.  He rallied and is  not on room air.  Swallow eval ordered and then cancelled = and reordered. Per MD note, pt is now being treated with lasix and ABS.  MD discussed aspiration with pt and wife understand risk.   Per chart review, pt had right lobe more than left lobe pna on prior CT chest 2022.  Wife and pt admit to pt have dysphagia at home causing him to cough - more on liquids than foods.  She states this has been occuring over the last few months and his intake has been very poor over the last few days.  Pt noted to have neck flexion even with repositioning upward - and his wife confirms this occuring at home.    Assessment / Plan / Recommendation  Clinical Impression  Pt awake initially upon entrance to room - however he was frequently nodding off unless provided with ongoing verbal cues.  Generalized weakness noted but no focal CN deficits. Pt able to hold his own cup to consume intake - Delayed congested coughing noted across liquid consistencies *more pronounced with thin than nectar. Pt and wife wife report this to be baseline.  Chin tuck posture tested with use of straw with thin - did not result in coughing - but pt denies improved swallowing with posture - and this causes anterior labial loss.  No indications of aspiration with solids/puree - and pt denies sensation of pharyngeal or esophgeal retention.  Given cough is  delayed - suspect his primary dysphagia is esophageal.  Observed pt to take small individual boluses without cues - suspect chronic deficits for which he has adapted.  Eructation noted toward end of minimal snack - prompting gross coughing with face turning red.  At that time,po trials discontinued - and pt reported being content with this decision.  Reviewed concept of comfort feeding with pt and wife- and they are agreeable to plan.  He was consuming a dys3/thin diet at home - as wife reports she cuts his food well.  Educated pt/wife to potential use of thicker drinks if increases comfort-decreases  cough (eg Ensures, etc), assuring pt alert, accepting and with stable breathing for po.  Anticipate po will be minimal.  Pt with strong cough and SLP orally suctioned yellow tinged secretions x1 - demonstrated use of suctioning to pt and his wife - having pt use independently.  All education completed to mitigate chronic dysphagia and aspiration risk.  SLP will sign off - provided instructions to RN and swallow precaution signs.  Thanks for this consult of this most pleasant pt and wife. SLP Visit Diagnosis: Dysphagia, pharyngeal phase (R13.13);Dysphagia, pharyngoesophageal phase (R13.14);Dysphagia, unspecified (R13.10)    Aspiration Risk  Moderate aspiration risk;Risk for inadequate nutrition/hydration    Diet Recommendation Dysphagia 3 (Mech soft);Thin liquid (pt on dys3/thin at home, allow family to bring in food per pt desire)   Liquid Administration via: Cup;Spoon;No straw Medication Administration: Whole meds with puree Supervision: Intermittent supervision to cue for compensatory strategies (wife ok to assist) Compensations: Slow rate;Small sips/bites Postural Changes: Seated upright at 90 degrees;Remain upright for at least 30 minutes after po intake    Other  Recommendations Oral Care Recommendations: Oral care BID Other Recommendations: Have oral suction available    Recommendations for follow up therapy are one component of a multi-disciplinary discharge planning process, led by the attending physician.  Recommendations may be updated based on patient status, additional functional criteria and insurance authorization.  Follow up Recommendations No SLP follow up      Assistance Recommended at Discharge  Full assist/supervision  Functional Status Assessment Patient has had a recent decline in their functional status and/or demonstrates limited ability to make significant improvements in function in a reasonable and predictable amount of time  Frequency and Duration     N/a        Prognosis    N/a    Swallow Study   General Date of Onset: 04/22/22 HPI: 88 yo male adm to [facility] with respiratory deficits.  Pt found to have RLL CAP, Pt has h/o heart failure, Afib on eliquis- now with incr troponins.  Overnight he decompensated and had agonal respirations with excessive oral secretions.  He rallied and is not on room air.  Swallow eval ordered and then cancelled = and reordered. Per MD note, pt is now being treated with lasix and ABS.  MD discussed aspiration with pt and wife understand risk.   Per chart review, pt had right lobe more than left lobe pna on prior CT chest 2022.  Wife and pt admit to pt have dysphagia at home causing him to cough - more on liquids than foods.  She states this has been occuring over the last few months and his intake has been very poor over the last few days.  Pt noted to have neck flexion even with repositioning upward - and his wife confirms this occuring at home. Type of Study: Bedside Swallow Evaluation Previous Swallow Assessment: none found  in chart Diet Prior to this Study: NPO Temperature Spikes Noted: No Respiratory Status: Room air History of Recent Intubation: No Behavior/Cognition: Lethargic/Drowsy Oral Cavity Assessment: Dry Oral Care Completed by SLP: Other (Comment) (oral suction set up and used - asked wife to ask for toothbrush/paste from staff) Oral Cavity - Dentition: Other (Comment) (lower permanent partial, upper intact) Vision: Functional for self-feeding Self-Feeding Abilities: Able to feed self (can hold his own cup) Patient Positioning: Upright in bed Baseline Vocal Quality: Low vocal intensity Volitional Cough: Weak;Congested Volitional Swallow: Unable to elicit    Oral/Motor/Sensory Function Overall Oral Motor/Sensory Function: Generalized oral weakness   Ice Chips Ice chips: Not tested   Thin Liquid Thin Liquid: Impaired Presentation: Cup;Self Fed;Spoon;Straw Pharyngeal  Phase Impairments: Cough - Delayed     Nectar Thick Nectar Thick Liquid: Impaired Presentation: Cup;Self Fed;Spoon Pharyngeal Phase Impairments: Cough - Delayed   Honey Thick Honey Thick Liquid: Not tested   Puree Puree: Within functional limits Presentation: Spoon   Solid     Solid: Impaired Presentation: Self Fed Oral Phase Impairments: Impaired mastication;Reduced lingual movement/coordination      Chales Abrahams 04/22/2022,10:03 AM   Rolena Infante, MS [facility] SLP Acute Rehab Services Office [phone] Pager [phone]

## 2022-04-23 ENCOUNTER — Encounter (HOSPITAL_COMMUNITY): Payer: Self-pay | Admitting: Internal Medicine

## 2022-04-23 DIAGNOSIS — Z7189 Other specified counseling: Secondary | ICD-10-CM | POA: Diagnosis not present

## 2022-04-23 DIAGNOSIS — Z515 Encounter for palliative care: Secondary | ICD-10-CM | POA: Diagnosis not present

## 2022-04-23 DIAGNOSIS — J189 Pneumonia, unspecified organism: Secondary | ICD-10-CM | POA: Diagnosis not present

## 2022-04-23 LAB — BLOOD GAS, VENOUS
Acid-Base Excess: 1.5 mmol/L (ref 0.0–2.0)
Bicarbonate: 27.2 mmol/L (ref 20.0–28.0)
Drawn by: 66758
O2 Saturation: 95 %
Patient temperature: 37
pCO2, Ven: 46 mmHg (ref 44–60)
pH, Ven: 7.38 (ref 7.25–7.43)
pO2, Ven: 76 mmHg — ABNORMAL HIGH (ref 32–45)

## 2022-04-23 LAB — COMPREHENSIVE METABOLIC PANEL
ALT: 28 U/L (ref 0–44)
AST: 50 U/L — ABNORMAL HIGH (ref 15–41)
Albumin: 2.8 g/dL — ABNORMAL LOW (ref 3.5–5.0)
Alkaline Phosphatase: 171 U/L — ABNORMAL HIGH (ref 38–126)
Anion gap: 11 (ref 5–15)
BUN: 36 mg/dL — ABNORMAL HIGH (ref 8–23)
CO2: 24 mmol/L (ref 22–32)
Calcium: 8.6 mg/dL — ABNORMAL LOW (ref 8.9–10.3)
Chloride: 108 mmol/L (ref 98–111)
Creatinine, Ser: 1.67 mg/dL — ABNORMAL HIGH (ref 0.61–1.24)
GFR, Estimated: 39 mL/min — ABNORMAL LOW (ref >60)
Glucose, Bld: 124 mg/dL — ABNORMAL HIGH (ref 70–99)
Potassium: 4.3 mmol/L (ref 3.5–5.1)
Sodium: 143 mmol/L (ref 135–145)
Total Bilirubin: 1.2 mg/dL (ref 0.3–1.2)
Total Protein: 6.2 g/dL — ABNORMAL LOW (ref 6.5–8.1)

## 2022-04-23 LAB — LACTIC ACID, PLASMA
Lactic Acid, Venous: 2.3 mmol/L (ref 0.5–1.9)
Lactic Acid, Venous: 1.6 mmol/L (ref 0.5–1.9)

## 2022-04-23 LAB — PROCALCITONIN: Procalcitonin: 0.33 ng/mL

## 2022-04-23 LAB — CBC
HCT: 43.9 % (ref 39.0–52.0)
Hemoglobin: 14.7 g/dL (ref 13.0–17.0)
MCH: 33.2 pg (ref 26.0–34.0)
MCHC: 33.5 g/dL (ref 30.0–36.0)
MCV: 99.1 fL (ref 80.0–100.0)
Platelets: 156 10*3/uL (ref 150–400)
RBC: 4.43 MIL/uL (ref 4.22–5.81)
RDW: 16.7 % — ABNORMAL HIGH (ref 11.5–15.5)
WBC: 8.9 10*3/uL (ref 4.0–10.5)
nRBC: 0 % (ref 0.0–0.2)

## 2022-04-23 LAB — BRAIN NATRIURETIC PEPTIDE: B Natriuretic Peptide: 1663.9 pg/mL — ABNORMAL HIGH (ref 0.0–100.0)

## 2022-04-23 LAB — PHOSPHORUS: Phosphorus: 3.8 mg/dL (ref 2.5–4.6)

## 2022-04-23 LAB — MAGNESIUM: Magnesium: 2.3 mg/dL (ref 1.7–2.4)

## 2022-04-23 MED ORDER — METOPROLOL TARTRATE 12.5 MG HALF TABLET
12.5000 mg | ORAL_TABLET | Freq: Two times a day (BID) | ORAL | Status: DC
  Administered 2022-04-23 – 2022-04-24 (×3): 12.5 mg via ORAL
  Filled 2022-04-23 (×3): qty 1

## 2022-04-23 MED ORDER — FUROSEMIDE 40 MG PO TABS
40.0000 mg | ORAL_TABLET | Freq: Every day | ORAL | Status: DC
  Administered 2022-04-24: 40 mg via ORAL
  Filled 2022-04-23: qty 1

## 2022-04-23 MED ORDER — IPRATROPIUM-ALBUTEROL 0.5-2.5 (3) MG/3ML IN SOLN
3.0000 mL | Freq: Four times a day (QID) | RESPIRATORY_TRACT | Status: DC
  Administered 2022-04-23 – 2022-04-24 (×5): 3 mL via RESPIRATORY_TRACT
  Filled 2022-04-23 (×5): qty 3

## 2022-04-23 MED ORDER — APIXABAN 2.5 MG PO TABS
2.5000 mg | ORAL_TABLET | Freq: Two times a day (BID) | ORAL | Status: DC
  Administered 2022-04-23 – 2022-04-24 (×2): 2.5 mg via ORAL
  Filled 2022-04-23 (×2): qty 1

## 2022-04-23 NOTE — Plan of Care (Signed)
  Problem: Education: Goal: Ability to demonstrate management of disease process will improve Outcome: Progressing Goal: Ability to verbalize understanding of medication therapies will improve Outcome: Progressing Goal: Individualized Educational Video(s) Outcome: Progressing   Problem: Activity: Goal: Capacity to carry out activities will improve Outcome: Progressing   Problem: Cardiac: Goal: Ability to achieve and maintain adequate cardiopulmonary perfusion will improve Outcome: Progressing   Problem: Activity: Goal: Ability to tolerate increased activity will improve Outcome: Progressing   Problem: Clinical Measurements: Goal: Ability to maintain a body temperature in the normal range will improve Outcome: Progressing   Problem: Respiratory: Goal: Ability to maintain adequate ventilation will improve Outcome: Progressing Goal: Ability to maintain a clear airway will improve Outcome: Progressing   Problem: Education: Goal: Knowledge of the prescribed therapeutic regimen will improve Outcome: Progressing   Problem: Coping: Goal: Ability to identify and develop effective coping behavior will improve Outcome: Progressing   Problem: Clinical Measurements: Goal: Quality of life will improve Outcome: Progressing   Problem: Respiratory: Goal: Verbalizations of increased ease of respirations will increase Outcome: Progressing   Problem: Role Relationship: Goal: Family's ability to cope with current situation will improve Outcome: Progressing Goal: Ability to verbalize concerns, feelings, and thoughts to partner or family member will improve Outcome: Progressing   Problem: Pain Management: Goal: Satisfaction with pain management regimen will improve Outcome: Progressing   Problem: Education: Goal: Knowledge of General Education information will improve Description: Including pain rating scale, medication(s)/side effects and non-pharmacologic comfort  measures Outcome: Progressing   Problem: Health Behavior/Discharge Planning: Goal: Ability to manage health-related needs will improve Outcome: Progressing   Problem: Clinical Measurements: Goal: Ability to maintain clinical measurements within normal limits will improve Outcome: Progressing Goal: Will remain free from infection Outcome: Progressing Goal: Diagnostic test results will improve Outcome: Progressing Goal: Respiratory complications will improve Outcome: Progressing Goal: Cardiovascular complication will be avoided Outcome: Progressing   Problem: Activity: Goal: Risk for activity intolerance will decrease Outcome: Progressing   Problem: Nutrition: Goal: Adequate nutrition will be maintained Outcome: Progressing   Problem: Coping: Goal: Level of anxiety will decrease Outcome: Progressing   Problem: Elimination: Goal: Will not experience complications related to bowel motility Outcome: Progressing Goal: Will not experience complications related to urinary retention Outcome: Progressing   Problem: Pain Managment: Goal: General experience of comfort will improve Outcome: Progressing   Problem: Safety: Goal: Ability to remain free from injury will improve Outcome: Progressing   Problem: Skin Integrity: Goal: Risk for impaired skin integrity will decrease Outcome: Progressing

## 2022-04-23 NOTE — Progress Notes (Signed)
Patient had incontinent episode of bowel, large black tarry stool. Pericare preformed and MD made aware.

## 2022-04-23 NOTE — Progress Notes (Signed)
Received a call from bedside RN regarding the patient being unresponsive.  The patient is DNR.    Presented at bedside, his wife is present in the room.  The patient is unresponsive to verbal or painful stimuli.  Has frequent episodes of apnea.  Hypotensive with MAP of 60.  250 IV fluid bolus administered with improvement.  Stat labs ordered.  Later, the patient became more arousable, calling for his wife and able to tell his bedside RN his full name.  Will continue to closely monitor and treat as indicated.   Time: 15 minutes.

## 2022-04-23 NOTE — Progress Notes (Signed)
Primary RN found patient unresponsive, hypotensive and bradycardic.Patient also appeared to be having period of tachypnea followed by periods of apnea. Vital signs were obtained and resulted in a red MEWS. Patient placed on 3L nasal cannula for oxygen support. The RRT nurse was paged to bedside to evaluate the patient. Provider notified of change in patient status. MEWS protocol followed. See RRT note, primary RN reassessments, and provider orders. Patient is resting in bed with eyes closed. Adequate and equal chest rise noted. Vital signs have stabilized. MEWS protocol continues.

## 2022-04-23 NOTE — Progress Notes (Signed)
Wife at bedside, patient asking for a "cold coke." Patient assessed drinking thin soda with no difficulty. Patient attempted to swallow whole pill and had difficulty with coughing, remainder of pills given whole with applesauce. Patient able to swallow pills with less difficulty but did have coughing approximately 30-60 seconds after swallowing. MD made aware. HOB remains elevated and wife at bedside. Call bell within reach.

## 2022-04-23 NOTE — Progress Notes (Signed)
PROGRESS NOTE  Edgar Thomas [id number] DOB: 1929/07/02 DOA: 04/21/2022 PCP: [name], MD   LOS: 2 days   Brief Narrative / Interim history: 88 year old male with history of dementia, chronic systolic CHF, CAD status post CABG, PAF on Eliquis, CKD 3A, prior CVA, prior DVT, hypothyroidism who comes to the hospital with shortness of breath.  He has been having few days of being less interactive, frequent cough, and worsening lower extremity swelling.  He was found to have pneumonia and have fluid overload.  Upon admission into the ED he was given antibiotics and diuretics, however had an event when he became poorly responsive, appeared to have agonal breathing and he was transitioned to full comfort care.  Following that, he spontaneously woke up feeling better.  Subjective / 24h Interval events: He had another episode last night in which she became poorly responsive, just like the night before.  He received Ativan around that time and it may or may not be related.  Alert this morning, in bed.  Wife is at bedside  Assesement and Plan: Principal Problem:   Community acquired pneumonia of right lower lobe of lung Active Problems:   Acute on chronic HFrEF (heart failure with reduced ejection fraction) (HCC)   Chronic atrial fibrillation with rapid ventricular response (HCC)   Acute kidney injury superimposed on chronic kidney disease stage IIIa (HCC)   Coronary artery disease involving native coronary artery of native heart without angina pectoris   Hypothyroidism   Dysphagia   Generalized weakness  Principal problem Acute hypoxic respiratory failure due to community-acquired pneumonia of right lower lung lobe-has been placed on ceftriaxone and azithromycin, continue.  Cultures are negative so far.  Active problems Goals of care-patient was transitioned to full comfort overnight 11/24 after becoming unresponsive and appeared to have agonal breathing.  After that he suddenly woke up, he  was alert, conversant.  Given the change, family opted for treatment right now -He had another episode last night, possibly due to mucous plugging/aspiration event versus Ativan.  Ativan now discontinued.  Wife is asking whether she could take him home, will recommend hospice if he is to go home right now.  She is not sure what to do if he has another event at home, and for now we will keep him here.  If he improves and does not have any further respiratory events could potentially go home with hospice.  Palliative care was consulted as well -I have also discussed with wife that his dysphagia and ongoing aspiration with eating/drinking, including silent aspiration of his own secretions, combined with generalized weakness and poor mucus clearance due to being bedbound confers a very poor prognosis.  Acute on chronic systolic CHF-most recent 2D echo done October 2023 shows an EF of 20-25%, global hypokinesis.  RV was normal.  He had evidence of overload on imaging, received IV Lasix which was followed by hypotension.  Overall a very difficult situation.  He has been started on midodrine, blood pressure is a little bit better this morning, I will cautiously place him on oral Lasix to start tomorrow at a lower dose than what he is at home  Essential hypertension/hypotension-he is on metoprolol which she needs for rate control.  This should not affect the blood pressure too much.  Continue.  Due to hypotension after the diuretics he was placed on midodrine.  Acute kidney injury on chronic kidney disease stage IIIa-most recent creatinine 1.1 in October, 1.5 yesterday on admission.  Renal function this morning overall  stable with a creatinine of 1.6.  Monitor  Coronary artery disease with history of CABG-no chest pain, appears stable, continue Toprol, statin  PAF-rate controlled, monitor on telemetry.  Continue metoprolol, Eliquis  Hyperlipidemia-continue statin  Troponin elevation-flat, not in a pattern  consistent with ACS, likely demand ischemia  Hypothyroidism-continue Synthroid, TSH acceptable at 4.1  Dysphagia-SLP evaluated, now on dysphagia 3 diet  Mild memory problems-monitor, at risk for in-hospital delirium  Scheduled Meds:  apixaban  5 mg Oral BID   ferrous sulfate  325 mg Oral Q breakfast   ipratropium-albuterol  3 mL Nebulization Q6H   levothyroxine  50 mcg Oral Q0600   metoprolol tartrate  12.5 mg Oral BID   midodrine  10 mg Oral TID WC   rosuvastatin  20 mg Oral q morning   sodium chloride flush  3 mL Intravenous Q12H   Continuous Infusions:  azithromycin Stopped (04/22/22 2211)   cefTRIAXone (ROCEPHIN)  IV Stopped (04/22/22 1725)   PRN Meds:.acetaminophen **OR** acetaminophen, antiseptic oral rinse, ondansetron **OR** ondansetron (ZOFRAN) IV, polyvinyl alcohol, senna-docusate  Current Outpatient Medications  Medication Instructions   Cholecalciferol (VITAMIN D-3) 125 MCG (5000 UT) TABS 1 capsule, Oral, Daily after breakfast   Crestor 20 mg, Oral, Every morning   Eliquis 5 mg, Oral, 2 times daily   ferrous sulfate 325 mg, Oral, Daily with breakfast   furosemide (LASIX) 80 mg, Oral, 2 times daily   Jardiance 10 mg, Oral, Every morning   levothyroxine (SYNTHROID) 50 mcg, Oral, Daily   metoprolol tartrate (LOPRESSOR) 50 mg, Oral, 2 times daily   Multiple Vitamins-Minerals (ONE-A-DAY MENS 50+ ADVANTAGE) TABS 1 tablet, Oral, Daily with breakfast   nitroGLYCERIN (NITROSTAT) 0.4 mg, Sublingual, Every 5 min PRN   ondansetron (ZOFRAN) 4 mg, Oral, Every 6 hours   potassium chloride (KLOR-CON) 10 MEQ tablet 10 mEq, Oral, Daily   testosterone cypionate (DEPOTESTOSTERONE CYPIONATE) 200 mg, Intramuscular, Every 28 days, Taking every 4 weeks    Diet Orders (From admission, onward)     Start     Ordered   04/22/22 0951  DIET DYS 3 Room service appropriate? Yes; Fluid consistency: Thin  Diet effective now       Question Answer Comment  Room service appropriate? Yes   Fluid  consistency: Thin      04/22/22 0950          DVT prophylaxis:  apixaban (ELIQUIS) tablet 5 mg   Lab Results  Component Value Date   PLT 156 04/23/2022      Code Status: DNR  Family Communication: spouse at bedside  Status is: Inpatient  Remains inpatient appropriate because: severity of illness  Level of care: Telemetry Medical  Consultants:  none  Objective: Vitals:   04/23/22 0130 04/23/22 0230 04/23/22 0600 04/23/22 0833  BP: (!) 115/91 (!) 109/93 (!) 117/93 (!) 115/98  Pulse: 91 (!) 108 (!) 107 85  Resp: 20 18 20 15   Temp:   97.7 F (36.5 C) (!) 97.5 F (36.4 C)  TempSrc:   Axillary Oral  SpO2: 95% 98% 100% (!) 88%  Weight:      Height:        Intake/Output Summary (Last 24 hours) at 04/23/2022 0928 Last data filed at 04/23/2022 0416 Gross per 24 hour  Intake 533.63 ml  Output --  Net 533.63 ml    Wt Readings from Last 3 Encounters:  04/22/22 67.6 kg  03/23/22 67.6 kg  03/12/22 68.4 kg    Examination:  Constitutional: NAD Eyes: lids  and conjunctivae normal, no scleral icterus ENMT: mmm Neck: normal, supple Respiratory: clear to auscultation bilaterally, no wheezing, no crackles. Normal respiratory effort.  Cardiovascular: Regular rate and rhythm, no murmurs / rubs / gallops. 2+ LE edema. Abdomen: soft, no distention, no tenderness. Bowel sounds positive.  Skin: no rashes Neurologic: Generalized weakness present, nonfocal   Data Reviewed: I have independently reviewed following labs and imaging studies   CBC Recent Labs  Lab 04/21/22 1855 04/22/22 1007 04/23/22 0032  WBC 11.3* 10.3 8.9  HGB 16.2 15.2 14.7  HCT 49.2 45.0 43.9  PLT 174 154 156  MCV 99.0 100.7* 99.1  MCH 32.6 34.0 33.2  MCHC 32.9 33.8 33.5  RDW 17.0* 16.7* 16.7*     Recent Labs  Lab 04/21/22 1855 04/22/22 1007 04/22/22 1313 04/23/22 0032 04/23/22 0624  NA 144 143  --  143  --   K 4.2 4.3  --  4.3  --   CL 103 104  --  108  --   CO2 26 24  --  24  --    GLUCOSE 99 114*  --  124*  --   BUN 31* 32*  --  36*  --   CREATININE 1.52* 1.53*  --  1.67*  --   CALCIUM 9.6 8.6*  --  8.6*  --   AST 33 37  --  50*  --   ALT 23 23  --  28  --   ALKPHOS 176* 168*  --  171*  --   BILITOT 1.7* 1.5*  --  1.2  --   ALBUMIN 3.7 3.1*  --  2.8*  --   MG  --   --   --  2.3  --   PROCALCITON  --   --   --  0.33  --   LATICACIDVEN  --   --   --  2.3* 1.6  TSH  --   --  4.157  --   --   BNP 2,341.6*  --   --  1,663.9*  --      ------------------------------------------------------------------------------------------------------------------ No results for input(s): "CHOL", "HDL", "LDLCALC", "TRIG", "CHOLHDL", "LDLDIRECT" in the last 72 hours.  Lab Results  Component Value Date   HGBA1C 5.0 03/08/2022   ------------------------------------------------------------------------------------------------------------------ Recent Labs    04/22/22 1313  TSH 4.157    Cardiac Enzymes No results for input(s): "CKMB", "TROPONINI", "MYOGLOBIN" in the last 168 hours.  Invalid input(s): "CK" ------------------------------------------------------------------------------------------------------------------    Component Value Date/Time   BNP 1,663.9 (H) 04/23/2022 0032    CBG: Recent Labs  Lab 04/21/22 2314 04/22/22 2312  GLUCAP 145* 163*     Recent Results (from the past 240 hour(s))  Resp Panel by RT-PCR (Flu A&B, Covid) Anterior Nasal Swab     Status: None   Collection Time: 04/21/22  6:12 PM   Specimen: Anterior Nasal Swab  Result Value Ref Range Status   SARS Coronavirus 2 by RT PCR NEGATIVE NEGATIVE Final    Comment: (NOTE) SARS-CoV-2 target nucleic acids are NOT DETECTED.  The SARS-CoV-2 RNA is generally detectable in upper respiratory specimens during the acute phase of infection. The lowest concentration of SARS-CoV-2 viral copies this assay can detect is 138 copies/mL. A negative result does not preclude SARS-Cov-2 infection and should  not be used as the sole basis for treatment or other patient management decisions. A negative result may occur with  improper specimen collection/handling, submission of specimen other than nasopharyngeal swab, presence of viral mutation(s) within  the areas targeted by this assay, and inadequate number of viral copies(<138 copies/mL). A negative result must be combined with clinical observations, patient history, and epidemiological information. The expected result is Negative.  Fact Sheet for Patients:  EntrepreneurPulse.com.au  Fact Sheet for Healthcare Providers:  IncredibleEmployment.be  This test is no t yet approved or cleared by the [place] FDA and  has been authorized for detection and/or diagnosis of SARS-CoV-2 by FDA under an Emergency Use Authorization (EUA). This EUA will remain  in effect (meaning this test can be used) for the duration of the COVID-19 declaration under Section 564(b)(1) of the Act, 21 U.S.C.section 360bbb-3(b)(1), unless the authorization is terminated  or revoked sooner.       Influenza A by PCR NEGATIVE NEGATIVE Final   Influenza B by PCR NEGATIVE NEGATIVE Final    Comment: (NOTE) The Xpert Xpress SARS-CoV-2/FLU/RSV plus assay is intended as an aid in the diagnosis of influenza from Nasopharyngeal swab specimens and should not be used as a sole basis for treatment. Nasal washings and aspirates are unacceptable for Xpert Xpress SARS-CoV-2/FLU/RSV testing.  Fact Sheet for Patients: EntrepreneurPulse.com.au  Fact Sheet for Healthcare Providers: IncredibleEmployment.be  This test is not yet approved or cleared by the [place] FDA and has been authorized for detection and/or diagnosis of SARS-CoV-2 by FDA under an Emergency Use Authorization (EUA). This EUA will remain in effect (meaning this test can be used) for the duration of the COVID-19 declaration under  Section 564(b)(1) of the Act, 21 U.S.C. section 360bbb-3(b)(1), unless the authorization is terminated or revoked.  Performed at [facility], [place] [address] [place], [place] [address]   Culture, blood (routine x 2)     Status: None (Preliminary result)   Collection Time: 04/21/22  9:50 PM   Specimen: BLOOD LEFT FOREARM  Result Value Ref Range Status   Specimen Description BLOOD LEFT FOREARM  Final   Special Requests   Final    BOTTLES DRAWN AEROBIC AND ANAEROBIC Blood Culture adequate volume   Culture   Final    NO GROWTH 2 DAYS Performed at [facility], [place] [address], [place], [place] [address]    Report Status PENDING  Incomplete  Culture, blood (routine x 2)     Status: None (Preliminary result)   Collection Time: 04/21/22  9:59 PM   Specimen: BLOOD RIGHT FOREARM  Result Value Ref Range Status   Specimen Description BLOOD RIGHT FOREARM  Final   Special Requests   Final    BOTTLES DRAWN AEROBIC AND ANAEROBIC Blood Culture results may not be optimal due to an inadequate volume of blood received in culture bottles   Culture   Final    NO GROWTH 2 DAYS Performed at [facility], [place] [address] [place],  [address]    Report Status PENDING  Incomplete     Radiology Studies: No results found.   Marzetta Board, MD, PhD [facility]  Between 7 am - 7 pm I am available, please contact me via Amion (for emergencies) or Securechat (non urgent messages)  Between 7 pm - 7 am I am not available, please contact night coverage MD/APP via Amion

## 2022-04-23 NOTE — Plan of Care (Signed)
  Problem: Activity: Goal: Ability to tolerate increased activity will improve Outcome: Not Progressing   Problem: Clinical Measurements: Goal: Ability to maintain a body temperature in the normal range will improve Outcome: Progressing   Problem: Respiratory: Goal: Ability to maintain adequate ventilation will improve Outcome: Progressing

## 2022-04-23 NOTE — Consult Note (Signed)
Consultation Note Date: 04/23/2022   Patient Name: Edgar Thomas  DOB: [birth year]  MRN: [id number]  Age / Sex: 88 y.o., male  PCP: Edgar Curia, MD Referring Physician: Leatha Gilding, MD  Reason for Consultation: Establishing goals of care  HPI/Patient Profile: 88 y.o. male  with past medical history of HFrEF/EF, 20 to 25% October 2023, CAD s/p CABG, history of stroke/TIA June 2022, memory loss, PAF on Eliquis, CKD 3, history of CVA, history of right femoral DVT, hypothyroid, admitted on 04/21/2022 with community-acquired pneumonia, acute hypoxic respiratory failure.   Clinical Assessment and Goals of Care: I have reviewed medical records including EPIC notes, labs and imaging, received report from RN, assessed the patient.  Edgar Thomas is sitting up in bed.  He appears acutely/chronically ill and quite frail.  He is alert, making and somewhat keeping eye contact.  He is oriented to person, place/situation, but not month.  He has known memory loss.  I believe that he can make his basic needs known.  His wife of 30 years, Edgar Thomas, is present at bedside.    We meet at the bedside to discuss diagnosis prognosis, GOC, EOL wishes, disposition and options.  I introduced Palliative Medicine as specialized medical care for people living with serious illness. It focuses on providing relief from the symptoms and stress of a serious illness. The goal is to improve quality of life for both the patient and the family.  We discussed a brief life review of the patient.  Edgar Thomas have been married for 30 years.  Edgar Thomas has 2 children, by [place] and [place] from a previous marriage.    We then focused on their current illness.  We talk about his pneumonia and the treatment plan.  We talked about incentive spirometer and flutter valve and the importance of turn, cough, deep breathe.  We talk about aspiration and the  treatment plan.  We talk about what we can and cannot change related to aspiration and diet.  Edgar Thomas states that they have been feeding foods that were not hard.  We talk about mechanical chopped such as meat loaf and mashed potatoes.  The natural disease trajectory and expectations at EOL were discussed.  Advanced directives, concepts specific to code status, artifical feeding and hydration, and rehospitalization were considered and discussed.  Edgar Thomas states that she and her husband have talked about CODE STATUS.  He has elected DNR.  She states that she can stand by his wish as can his children.  She states that the family is in agreement about direction of care.  We talk about preferred place of death and rehospitalization.  I shared that just like I get sick again, so will Edgar Thomas.    Hospice and Palliative Care services outpatient were explained and offered.  Edgar Thomas shares that they have been in contact with "Edgar Thomas" hospice care from [place].  She states that they were told "call us when you need [place]".  We talk about the benefits of starting hospice  care early.  I shared that people with hospice usually live longer, and better.  We talk about relationship building and support.  Discussed the importance of continued conversation with family and the medical providers regarding overall plan of care and treatment options, ensuring decisions are within the context of the patient's values and GOCs.  Questions and concerns were addressed.  The family was encouraged to call with questions or concerns.  PMT will continue to support holistically.  Conference with attending, bedside nursing staff, transition of care team related to patient condition, needs, goals of care, disposition.    HCPOA HCPOA -wife of 30 years, Edgar Thomas, tells me that she has healthcare power of attorney paperwork.  Edgar Thomas 2 children understand and agree with their choice for DNR.    SUMMARY OF RECOMMENDATIONS   Continue to  treat the treatable but no CPR or intubation Time for outcomes Has been in contact with "[facility]" from [place] PMT to follow   Code Status/Advance Care Planning: DNR -verified with patient/family.  DNR/goldenrod form completed and placed on chart.  Symptom Management:  Per hospitalist, no additional needs at this time.  Palliative Prophylaxis:  Oral Care and Turn Reposition  Additional Recommendations (Limitations, Scope, Preferences): Treat the treatable but no CPR or intubation.  Time for outcomes.  Psycho-social/Spiritual:  Desire for further Chaplaincy support:no Additional Recommendations: Caregiving  Support/Resources and Education on Hospice  Prognosis:  Unable to determine, based on outcomes.  6 months or less would not be surprising based on chronic illness burden, acute illness.   Discharge Planning: To be determined, anticipate home with hospice      Primary Diagnoses: Present on Admission:  Community acquired pneumonia of right lower lobe of lung  Chronic atrial fibrillation with rapid ventricular response (HCC)  Acute on chronic HFrEF (heart failure with reduced ejection fraction) (HCC)  Coronary artery disease involving native coronary artery of native heart without angina pectoris  Acute kidney injury superimposed on chronic kidney disease stage IIIa (HCC)  Hypothyroidism   I have reviewed the medical record, interviewed the patient and family, and examined the patient. The following aspects are pertinent.  Past Medical History:  Diagnosis Date   A-fib (HCC)    Anemia    CAD (coronary artery disease)    Constipation    GERD (gastroesophageal reflux disease)    Heart attack (HCC)    Heartburn    Hyperlipemia    Hypertension    Post herpetic neuralgia    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   Left lenticulostriate and external capsule   Stroke (HCC)    TIA (transient ischemic attack)    Vitamin D deficiency    Social History    Socioeconomic History   Marital status: Married    Spouse name: kim   Number of children: 6   Years of education: Not on file   Highest education level: Not on file  Occupational History   Occupation: retired    Comment: Management consultant  Tobacco Use   Smoking status: Never   Smokeless tobacco: Never  Substance and Sexual Activity   Alcohol use: No   Drug use: No   Sexual activity: Not on file  Other Topics Concern   Not on file  Social History Narrative   Lives with wife   Right Handed   Drinks no caffeine   Social Determinants of Health   Financial Resource Strain: Low Risk  (03/09/2022)   Overall Financial Resource Strain (CARDIA)  Difficulty of Paying Living Expenses: Not hard at all  Food Insecurity: No Food Insecurity (03/09/2022)   Hunger Vital Sign    Worried About Running Out of Food in the Last Year: Never true    Ran Out of Food in the Last Year: Never true  Transportation Needs: No Transportation Needs (03/09/2022)   PRAPARE - Administrator, Civil Service (Medical): No    Lack of Transportation (Non-Medical): No  Physical Activity: Not on file  Stress: Not on file  Social Connections: Not on file   Family History  Problem Relation Age of Onset   Heart disease Mother        died at age 79 of heart attack   Leukemia Sister    Heart disease Brother    Hypertension Brother    Scheduled Meds:  apixaban  2.5 mg Oral BID   [START ON 04/24/2022] furosemide  40 mg Oral Daily   ipratropium-albuterol  3 mL Nebulization Q6H   levothyroxine  50 mcg Oral Q0600   metoprolol tartrate  12.5 mg Oral BID   midodrine  10 mg Oral TID WC   sodium chloride flush  3 mL Intravenous Q12H   Continuous Infusions:  azithromycin Stopped (04/22/22 2211)   cefTRIAXone (ROCEPHIN)  IV Stopped (04/22/22 1725)   PRN Meds:.acetaminophen **OR** acetaminophen, antiseptic oral rinse, ondansetron **OR** ondansetron (ZOFRAN) IV, polyvinyl alcohol,  senna-docusate Medications Prior to Admission:  Prior to Admission medications   Medication Sig Start Date End Date Taking? Authorizing Provider  apixaban (ELIQUIS) 5 MG TABS tablet Take 5 mg by mouth 2 (two) times daily.   Yes [provider]  Cholecalciferol (VITAMIN D-3) 125 MCG (5000 UT) TABS Take 1 capsule by mouth daily after breakfast.   Yes [provider]  CRESTOR 20 MG tablet Take 20 mg by mouth every morning.  08/26/14  Yes [provider]  ferrous sulfate 325 (65 FE) MG tablet Take 325 mg by mouth daily with breakfast.   Yes [provider]  furosemide (LASIX) 80 MG tablet Take 1 tablet (80 mg total) by mouth 2 (two) times daily. 03/12/22  Yes Duke, Roe Rutherford, PA  JARDIANCE 10 MG TABS tablet Take 10 mg by mouth every morning. 05/10/21  Yes [provider]  levothyroxine (SYNTHROID) 50 MCG tablet Take 50 mcg by mouth daily. 02/08/22  Yes [provider]  metoprolol tartrate (LOPRESSOR) 50 MG tablet Take 1 tablet (50 mg total) by mouth 2 (two) times daily. 03/12/22  Yes Duke, Roe Rutherford, PA  Multiple Vitamins-Minerals (ONE-A-DAY MENS 50+ ADVANTAGE) TABS Take 1 tablet by mouth daily with breakfast.    Yes [provider]  nitroGLYCERIN (NITROSTAT) 0.4 MG SL tablet Place 0.4 mg under the tongue every 5 (five) minutes as needed for chest pain.   Yes [provider]  ondansetron (ZOFRAN) 4 MG tablet Take 1 tablet (4 mg total) by mouth every 6 (six) hours. Patient taking differently: Take 4 mg by mouth daily as needed for nausea. 10/07/21  Yes Plunkett, Alphonzo Lemmings, MD  potassium chloride (KLOR-CON) 10 MEQ tablet Take 1 tablet (10 mEq total) by mouth daily. 03/12/22  Yes Duke, Roe Rutherford, PA  testosterone cypionate (DEPOTESTOSTERONE CYPIONATE) 200 MG/ML injection Inject 200 mg into the muscle every 28 (twenty-eight) days. Taking every 4 weeks 02/17/22  Yes [provider]   Allergies  Allergen Reactions    Contrast Media [Iodinated Contrast Media] Rash    [facility] years ago at [facility]  Metrizamide Rash    [facility] (Am[facility]   Other Other (See Comments)    Patient preference: NO MEAT OR DAIRY!!   Amiodarone Other (See Comments)    Makes the patient walk sideways and he falls   Ciprofloxacin Other (See Comments)    Was told by MD to "not take"  No quinolones per MD   Tramadol Nausea And Vomiting   Review of Systems  Unable to perform ROS: Age    Physical Exam Vitals and nursing note reviewed.  Constitutional:      General: He is not in acute distress.    Appearance: He is ill-appearing.  HENT:     Mouth/Throat:     Mouth: Mucous membranes are moist.  Cardiovascular:     Rate and Rhythm: Normal rate.  Pulmonary:     Effort: Pulmonary effort is normal. No respiratory distress.     Comments: Frequent wet productive cough Musculoskeletal:     Comments: Frail and thin  Skin:    General: Skin is warm and dry.  Neurological:     Mental Status: He is alert.     Comments: Oriented to person and place, not month.  Known memory loss  Psychiatric:        Mood and Affect: Mood normal.        Behavior: Behavior normal.     Comments: Calm and cooperative, not fearful     Vital Signs: BP (!) 115/98 (BP Location: Left Arm)   Pulse 85   Temp (!) 97.5 F (36.4 C) (Oral)   Resp 15   Ht 5\' 10"  (1.778 m)   Wt 67.6 kg   SpO2 90%   BMI 21.38 kg/m  Pain Scale: 0-10   Pain Score: Asleep   SpO2: SpO2: 90 % O2 Device:SpO2: 90 % O2 Flow Rate: .O2 Flow Rate (L/min): 3 L/min  IO: Intake/output summary:  Intake/Output Summary (Last 24 hours) at 04/23/2022 1345 Last data filed at 04/23/2022 0416 Gross per 24 hour  Intake 533.63 ml  Output --  Net 533.63 ml    LBM: Last BM Date : 04/22/22 Baseline Weight: Weight: 67.6 kg Most recent weight: Weight: 67.6 kg     Palliative Assessment/Data:   Flowsheet Rows    Flowsheet Row Most  Recent Value  Intake Tab   Referral Department Hospitalist  Unit at Time of Referral Cardiac/Telemetry Unit  Palliative Care Primary Diagnosis Pulmonary  Date Notified 04/23/22  Palliative Care Type New Palliative care  Reason for referral Clarify Goals of Care  Date of Admission 04/21/22  Date first seen by Palliative Care 04/23/22  # of days Palliative referral response time 0 Day(s)  # of days IP prior to Palliative referral 2  Clinical Assessment   Palliative Performance Scale Score 30%  Pain Max last 24 hours Not able to report  Pain Min Last 24 hours Not able to report  Dyspnea Max Last 24 Hours Not able to report  Dyspnea Min Last 24 hours Not able to report  Psychosocial & Spiritual Assessment   Palliative Care Outcomes        Time In: 0830 Time Out: 0945 Time Total: 75 minutes  Greater than 50%  of this time was spent counseling and coordinating care related to the above assessment and plan.  Signed by: Katheran Aweasha A Twanisha Foulk, NP   Please contact Palliative Medicine Team phone at [phone] for questions and concerns.  For individual provider: See Loretha StaplerAmion

## 2022-04-23 NOTE — Progress Notes (Signed)
RT NTS pt with assistance of pt's daughter at bedside.  Pt fought while procedure was performed but was willing each time.  Little secretions were removed d/t him moving away and pulling while RT attempted to advance the catheter.  Secretions were brown and appeared to be chunky (food?).  Pt daughter stated he hasn't been eating a lot but had some lunch before RT arrived. RT will cont to monitor.

## 2022-04-23 NOTE — TOC Initial Note (Signed)
Transition of Care [facility]) - Initial/Assessment Note    Patient Details  Name: Edgar Thomas MRN: [id number] Date of Birth: [birth year]  [facility] CM/SW Contact:    Bess Kinds, RN Phone Number: [phone] 04/23/2022, 3:39 PM  Clinical Narrative:                  Spoke with patient's spouse, [name], at the bedside to discuss post acute transition. Confirmed desire for hospice care at home. [name] has been working with an Scientist, forensic, however, name provided is not familiar. [name] has called Rhoderick Moody of [facility] at [phone] and left message for follow up call. Discussed that this does not appear to be a hospice agency. Offered choice of hospice agency and reviewed Medicare compare site. No preference. Referral to [place] [facility] [place]. [name] would like to transport patient home in private vehicle - patient is currently on oxygen and would need portable tank. TOC following for transition needs.  Expected Discharge Plan: Home w Hospice Care Barriers to Discharge: Continued Medical Work up   Patient Goals and CMS Choice Patient states their goals for this hospitalization and ongoing recovery are:: return home with hospice care CMS Medicare.gov Compare Post Acute Care list provided to:: Patient Represenative (must comment) Conception Chancy, spouse) Choice offered to / list presented to : Spouse  Expected Discharge Plan and Services Expected Discharge Plan: Home w Hospice Care   Discharge Planning Services: CM Consult Post Acute Care Choice: Hospice Living arrangements for the past 2 months: Single Family Home                                      Prior Living Arrangements/Services Living arrangements for the past 2 months: Single Family Home Lives with:: Self, Spouse Patient language and need for interpreter reviewed:: Yes        Need for Family Participation in Patient Care: Yes (Comment) Care giver support system in place?: Yes (comment)    Criminal Activity/Legal Involvement Pertinent to Current Situation/Hospitalization: No - Comment as needed  Activities of Daily Living      Permission Sought/Granted                  Emotional Assessment Appearance:: Appears stated age   Affect (typically observed): Calm, Accepting Orientation: : Oriented to Self Alcohol / Substance Use: Not Applicable Psych Involvement: No (comment)  Admission diagnosis:  Atrial fibrillation with rapid ventricular response (HCC) [I48.91] Community acquired pneumonia of right lower lobe of lung [J18.9] Acute on chronic congestive heart failure, unspecified heart failure type (HCC) [I50.9] Patient Active Problem List   Diagnosis Date Noted   Community acquired pneumonia of right lower lobe of lung 04/21/2022   Acute kidney injury superimposed on chronic kidney disease stage IIIa (HCC) 04/21/2022   Hypothyroidism 04/21/2022   Dysphagia 04/21/2022   Generalized weakness 04/21/2022   Pleural effusion on left 03/10/2022   Acute on chronic HFrEF (heart failure with reduced ejection fraction) (HCC) 03/10/2022   Delirium 03/10/2022   DVT (deep venous thrombosis) (HCC) 05/21/2021   Influenza A 05/18/2021   Chronic atrial fibrillation with rapid ventricular response (HCC) 05/18/2021   Bladder stone 05/18/2021   Essential hypertension 05/18/2021   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   Hematoma of right thigh 11/19/2018   Symptomatic anemia 11/08/2018   Pseudophakia 03/06/2018   S/P laser cataract surgery 03/06/2018   AMD (age-related macular  degeneration), bilateral 02/26/2018   Combined form of senile cataract 02/17/2018   Herpes zoster with complication 04/09/2017   UTI (urinary tract infection) 02/18/2016   Chronic combined systolic (congestive) and diastolic (congestive) heart failure (HCC) 02/17/2016   CHF (congestive heart failure) (HCC) 02/17/2016   Gait disturbance 02/17/2016   Near syncope 09/03/2015   Hyponatremia 09/03/2015    Bronchitis 08/29/2015   Encounter for therapeutic drug monitoring 12/12/2013   Anticoagulated 12/10/2013   Cardiomyopathy, ischemic- EF 40-45% 12/09/2013   Atrial fibrillation with RVR (HCC) 12/05/2013   Coronary artery disease involving native coronary artery of native heart without angina pectoris 12/05/2013   GERD without esophagitis    Mixed hyperlipidemia    S/P CABG x 4 12/04/13 12/03/2013   Leg pain 06/13/2012   PCP:  Simone Curia, MD Pharmacy:   [facility] - [place], [place] - [address] [place] [address] Phone: [phone] Fax: [phone]  [facility] [address] [place] - [address] [place] [address] Phone: [phone] Fax: [phone]  [facility] [address] [place] [address] Phone: [phone] Fax: [phone]     Social Determinants of Health (SDOH) Interventions    Readmission Risk Interventions     No data to display

## 2022-04-24 DIAGNOSIS — J189 Pneumonia, unspecified organism: Secondary | ICD-10-CM | POA: Diagnosis not present

## 2022-04-24 DIAGNOSIS — Z7189 Other specified counseling: Secondary | ICD-10-CM | POA: Diagnosis not present

## 2022-04-24 DIAGNOSIS — Z515 Encounter for palliative care: Secondary | ICD-10-CM | POA: Diagnosis not present

## 2022-04-24 LAB — CBC
HCT: 47.1 % (ref 39.0–52.0)
Hemoglobin: 15.2 g/dL (ref 13.0–17.0)
MCH: 32.8 pg (ref 26.0–34.0)
MCHC: 32.3 g/dL (ref 30.0–36.0)
MCV: 101.7 fL — ABNORMAL HIGH (ref 80.0–100.0)
Platelets: 170 10*3/uL (ref 150–400)
RBC: 4.63 MIL/uL (ref 4.22–5.81)
RDW: 17 % — ABNORMAL HIGH (ref 11.5–15.5)
WBC: 7.9 10*3/uL (ref 4.0–10.5)
nRBC: 0 % (ref 0.0–0.2)

## 2022-04-24 LAB — BASIC METABOLIC PANEL
Anion gap: 9 (ref 5–15)
BUN: 35 mg/dL — ABNORMAL HIGH (ref 8–23)
CO2: 25 mmol/L (ref 22–32)
Calcium: 8.7 mg/dL — ABNORMAL LOW (ref 8.9–10.3)
Chloride: 107 mmol/L (ref 98–111)
Creatinine, Ser: 1.56 mg/dL — ABNORMAL HIGH (ref 0.61–1.24)
GFR, Estimated: 42 mL/min — ABNORMAL LOW (ref >60)
Glucose, Bld: 111 mg/dL — ABNORMAL HIGH (ref 70–99)
Potassium: 3.9 mmol/L (ref 3.5–5.1)
Sodium: 141 mmol/L (ref 135–145)

## 2022-04-24 LAB — MAGNESIUM: Magnesium: 2.4 mg/dL (ref 1.7–2.4)

## 2022-04-24 MED ORDER — AMOXICILLIN-POT CLAVULANATE 250-62.5 MG/5ML PO SUSR: 875.0000 mg | Freq: Two times a day (BID) | ORAL | 0 refills | Status: AC

## 2022-04-24 MED ORDER — FUROSEMIDE 80 MG PO TABS: 80.0000 mg | ORAL_TABLET | Freq: Two times a day (BID) | ORAL | 3 refills | Status: DC

## 2022-04-24 MED ORDER — APIXABAN 2.5 MG PO TABS: 2.5000 mg | ORAL_TABLET | Freq: Two times a day (BID) | ORAL | 1 refills | Status: DC

## 2022-04-24 NOTE — Care Management Important Message (Signed)
Important Message  Patient Details  Name: Edgar Thomas MRN: [id number] Date of Birth: 12-Mar-1930   Medicare Important Message Given:  Yes     Sherilyn Banker 04/24/2022, 1:29 PM

## 2022-04-24 NOTE — Evaluation (Addendum)
Physical Therapy Evaluation  Patient Details Name: Edgar Thomas MRN: [id number] DOB: 12/19/1929 Today's Date: 04/24/2022  History of Present Illness  Pt is a 88 y/o male who presents 11/24 with 3 days of productive cough, SOB, and progressive LE swelling. He was found to have right lower lobe CAP. PMH significant for HFrEF (EF 20-25% by TTE 03/09/2022), CAD s/p CABG, PAF on Eliquis, CKD stage IIIa, history of CVA, right femoral DVT, hypothyroidism.   Clinical Impression  Pt admitted with above diagnosis. Pt currently with functional limitations due to the deficits listed below (see PT Problem List). At the time of PT eval pt was able to perform transfers with +2 assist from bed>chair. Wife present and provided history. She states pt safer with HHA vs the RW at home, so we opted for HHA +2 for bed>chair. Pt with increased difficulty advancing LE's and chair needed to be pulled up behind him for safe descent to sitting. Noted O2 sats in low 70's upon entry, with supplemental O2 donned and on 2L/min. O2 increased to 3L/min and sats improved to 91%. RN notified. At end of session wife reports that pt's abdomen appears to be distended compared to this morning, and states she can feel a mass in his abdomen as well. RN notified of this also. Per chart review, it appears that plan is for return home with hospice support. Acute pt will continue to follow while admitted, however pt will not require follow up PT at d/c.           Recommendations for follow up therapy are one component of a multi-disciplinary discharge planning process, led by the attending physician.  Recommendations may be updated based on patient status, additional functional criteria and insurance authorization.  Follow Up Recommendations No PT follow up      Assistance Recommended at Discharge Frequent or constant Supervision/Assistance  Patient can return home with the following  Two people to help with walking and/or transfers;A lot  of help with bathing/dressing/bathroom;Assistance with cooking/housework;Assist for transportation;Help with stairs or ramp for entrance    Equipment Recommendations None recommended by PT  Recommendations for Other Services       Functional Status Assessment Patient has had a recent decline in their functional status and demonstrates the ability to make significant improvements in function in a reasonable and predictable amount of time.     Precautions / Restrictions Precautions Precautions: Fall Precaution Comments: Watch O2 Restrictions Weight Bearing Restrictions: No      Mobility  Bed Mobility Overal bed mobility: Needs Assistance Bed Mobility: Supine to Sit     Supine to sit: Min assist     General bed mobility comments: Pt initiating trunk elevation and advancing LE's to EOB. Required assist to transition fully to EOB with feet on the floor.    Transfers Overall transfer level: Needs assistance Equipment used: 2 person hand held assist Transfers: Sit to/from Stand, Bed to chair/wheelchair/BSC Sit to Stand: Min assist   Step pivot transfers: Max assist       General transfer comment: Assist for power up to full stand. Pt achieved x3 stands total during session, for pivot to chair, for pericare (pt incontinent of stool), and to apply sacral foam. Chair pulled up behind pt to sit as step pivot more difficult for pt to manage.    Ambulation/Gait               General Gait Details: Unable to progress to ambulation at this time.  Stairs  Wheelchair Mobility    Modified Rankin (Stroke Patients Only)       Balance Overall balance assessment: Needs assistance Sitting-balance support: Feet supported, No upper extremity supported Sitting balance-Leahy Scale: Poor Sitting balance - Comments: leaning forward and to the L onto wife's arm for support   Standing balance support: Bilateral upper extremity supported Standing balance-Leahy  Scale: Zero Standing balance comment: max assist required for dynamic movement                             Pertinent Vitals/Pain Pain Assessment Pain Assessment: No/denies pain    Home Living Family/patient expects to be discharged to:: Private residence Living Arrangements: Spouse/significant other Available Help at Discharge: Family;Available 24 hours/day Type of Home: House Home Access: Stairs to enter Entrance Stairs-Rails: Right;Left;Can reach both Entrance Stairs-Number of Steps: 6 Alternate Level Stairs-Number of Steps: 2 steps down into the den, then 2 steps up into the bedroom. Home Layout: Two level Home Equipment: Agricultural consultant (2 wheels);BSC/3in1;Shower seat - built in;Grab bars - toilet;Grab bars - tub/shower;Hand held shower head      Prior Function Prior Level of Function : Needs assist             Mobility Comments: Wife assist with mobility HHA instead of using the RW. Recliner bext to the bed that reclines out almost completely flat but not a lift chair. ADLs Comments: Assists with all ADL's. Can feed himself a little but wife reports it is messy so she typically helps.     Hand Dominance   Dominant Hand: Right    Extremity/Trunk Assessment   Upper Extremity Assessment Upper Extremity Assessment: Generalized weakness    Lower Extremity Assessment Lower Extremity Assessment: Generalized weakness    Cervical / Trunk Assessment Cervical / Trunk Assessment: Kyphotic (Forward head posture with rounded shoulders)  Communication   Communication: No difficulties  Cognition Arousal/Alertness: Lethargic (easily roused to voice but keeping eyes closed) Behavior During Therapy: Flat affect Overall Cognitive Status: Difficult to assess                                          General Comments      Exercises     Assessment/Plan    PT Assessment Patient needs continued PT services  PT Problem List Decreased  strength;Decreased activity tolerance;Decreased balance;Decreased mobility;Decreased knowledge of use of DME;Decreased safety awareness;Decreased knowledge of precautions;Cardiopulmonary status limiting activity       PT Treatment Interventions DME instruction;Gait training;Stair training;Functional mobility training;Therapeutic activities;Therapeutic exercise;Balance training    PT Goals (Current goals can be found in the Care Plan section)  Acute Rehab PT Goals Patient Stated Goal: None stated PT Goal Formulation: With patient/family Time For Goal Achievement: 05/08/22 Potential to Achieve Goals: Good    Frequency Min 3X/week     Co-evaluation               AM-PAC PT "6 Clicks" Mobility  Outcome Measure Help needed turning from your back to your side while in a flat bed without using bedrails?: A Little Help needed moving from lying on your back to sitting on the side of a flat bed without using bedrails?: A Little Help needed moving to and from a bed to a chair (including a wheelchair)?: Total Help needed standing up from a chair using your arms (e.g., wheelchair or  bedside chair)?: A Lot Help needed to walk in hospital room?: Total Help needed climbing 3-5 steps with a railing? : Total 6 Click Score: 11    End of Session Equipment Utilized During Treatment: Oxygen Activity Tolerance: Patient limited by fatigue Patient left: in chair;with call bell/phone within reach;with chair alarm set;with family/visitor present Nurse Communication: Mobility status PT Visit Diagnosis: Unsteadiness on feet (R26.81);Difficulty in walking, not elsewhere classified (R26.2)    Time: 1771-1657 PT Time Calculation (min) (ACUTE ONLY): 30 min   Charges:   PT Evaluation $PT Eval Moderate Complexity: 1 Mod PT Treatments $Gait Training: 8-22 mins        Conni Slipper, PT, DPT Acute Rehabilitation Services Secure Chat Preferred Office: [phone]   Marylynn Pearson 04/24/2022,  2:25 PM

## 2022-04-24 NOTE — Progress Notes (Signed)
Palliative: Edgar Thomas is sitting up quietly in bed.  He appears acutely/chronically ill and very frail.  He is alert, oriented but with known memory loss.  I believe that he is able to make his basic needs known.  His wife of 30 years, Edgar Thomas, is present at bedside.  We talk about disposition, home with hospice today.  Provider of choice is hospice with [facility] [place].  I encouraged him to work closely for symptom management and support with hospice.  I encouraged him to work with hospice for further acute illness needs.  We discussed the concept of do not rehospitalize.  Questions answered.  No further needs noted at this time.  Face-to-face conference with bedside nursing staff and transition of care team related to patient condition, needs, goals of care, disposition.  Plan: Discharging home today with the benefits of "treat the treatable" hospice care with [facility] [place].  Prognosis: 1 to 2 months or less would not be surprising based on acute illness, aspiration risk, chronic illness burden, decreasing functional status. DNR/goldenrod form completed and placed on chart yesterday.  50 minutes Edgar Carmel, NP Palliative medicine team Team phone [phone] Greater than 50% of this time was spent counseling and coordinating care related to the above assessment and plan.

## 2022-04-24 NOTE — Progress Notes (Signed)
Heart Failure Navigator Progress Note  Assessed for Heart & Vascular TOC clinic readiness.  Patient does not meet criteria due to Hospice upon discharge.   Navigator will sign off at this time.    Rhae Hammock, BSN, Scientist, clinical (histocompatibility and immunogenetics) Only

## 2022-04-24 NOTE — Discharge Summary (Signed)
Physician Discharge Summary  Javonni Macke YHC:[id number] DOB: [birth year] DOA: 04/21/2022  PCP: Simone Curia, MD  Admit date: 04/21/2022 Discharge date: 04/24/2022  Admitted From: home Disposition:  home with hospice  Recommendations for Outpatient Follow-up:  Follow up with hospice agency  Discharge Condition: stable CODE STATUS: DNR Diet Orders (From admission, onward)     Start     Ordered   04/22/22 0951  DIET DYS 3 Room service appropriate? Yes; Fluid consistency: Thin  Diet effective now       Question Answer Comment  Room service appropriate? Yes   Fluid consistency: Thin      04/22/22 0950            HPI: Per admitting MD, Phillipe Clemon is a 88 y.o. male with medical history significant for HFrEF (EF 20-25% by TTE 03/09/2022), CAD s/p CABG, PAF on Eliquis, CKD stage IIIa, history of CVA, right femoral DVT, hypothyroidism who presented to the ED for evaluation of shortness of breath.  History is supplemented by spouse at bedside. Patient has had 3 days of frequent cough productive of yellow sputum.  He has had associated shortness of breath and progressive lower extremity swelling.  He has been lethargic and generally weak.  He is mostly spending most of his time in the recliner and requires assistance from his spouse for ambulation, bathing, getting dressed, brushing teeth. He has not had any chest pain.  Spouse notes that he does frequently appear to be choking on foods and liquids when eating/drinking.  He was complaining about abdominal pain recently and has felt nauseous.  Hospital Course / Discharge diagnoses: Principal Problem:   Community acquired pneumonia of right lower lobe of lung Active Problems:   Acute on chronic HFrEF (heart failure with reduced ejection fraction) (HCC)   Chronic atrial fibrillation with rapid ventricular response (HCC)   Acute kidney injury superimposed on chronic kidney disease stage IIIa (HCC)   Coronary artery disease involving native  coronary artery of native heart without angina pectoris   Hypothyroidism   Dysphagia   Generalized weakness   Principal problem Acute hypoxic respiratory failure due to community-acquired pneumonia of right lower lung lobe-has been placed on ceftriaxone and azithromycin with improvement in his respiratory status.  There is also a degree of aspiration given dysphagia.  With improvement, he will be transitioned to Augmentin and discharged home in stable condition.   Active problems Goals of care-patient was transitioned to full comfort overnight 11/24 after becoming unresponsive and appeared to have agonal breathing.  After that he suddenly woke up, he was alert, conversant.  Given the change, family opted for treatment right now.  Palliative care also consulted and followed patient while hospitalized.  He is DNR.  A MOST form has been completed, wife wishes for patient to receive limited additional interventions such as return to hospital for antibiotics, but no ICU, no feeding tubes.  Overall, he has a very poor prognosis due to dysphagia and ongoing aspiration.  Wife asking me to stop any non-absolutely essential medications, discontinue Crestor, testosterone, iron and vitamin supplements now that he is on hospice Acute on chronic systolic CHF-most recent 2D echo done October 2023 shows an EF of 20-25%, global hypokinesis.  RV was normal.  He had evidence of fluid overload on imaging, received IV Lasix which was followed by hypotension.  He was briefly on midodrine.  Seems to be tolerating oral diuretics now, continue upon discharge.  Essential hypertension-continue home regimen  Acute kidney injury on  chronic kidney disease stage IIIa-most recent creatinine 1.1 in October, during this admission has been in the 1.5-1.6 range, stable.  I wonder whether this is a new baseline for him  Coronary artery disease with history of CABG-no chest pain, appears stable, continue Toprol PAF-rate controlled,  monitor on telemetry.  Continue metoprolol, Eliquis dose has been reduced after discussing with pharmacy due to age/renal function  Hyperlipidemia-continue statin Troponin elevation-flat, not in a pattern consistent with ACS, likely demand ischemia Hypothyroidism-continue Synthroid, TSH acceptable at 4.1 Dysphagia-SLP evaluated, now on dysphagia 3 diet Mild memory problems-monitor, at risk for in-hospital delirium  Sepsis ruled out   Discharge Instructions   Allergies as of 04/24/2022       Reactions   Contrast Media [iodinated Contrast Media] Rash   19 years ago at [facility]   Metrizamide Rash   19 years ago at [facility] ([place])   Other Other (See Comments)   Patient preference: NO MEAT OR DAIRY!!   Amiodarone Other (See Comments)   Makes the patient walk sideways and he falls   Ciprofloxacin Other (See Comments)   Was told by MD to "not take"  No quinolones per MD   Tramadol Nausea And Vomiting        Medication List     STOP taking these medications    Crestor 20 MG tablet Generic drug: rosuvastatin   ferrous sulfate 325 (65 FE) MG tablet   One-A-Day Mens 50+ Advantage Tabs   testosterone cypionate 200 MG/ML injection Commonly known as: DEPOTESTOSTERONE CYPIONATE   Vitamin D-3 125 MCG (5000 UT) Tabs       TAKE these medications    amoxicillin-clavulanate 250-62.5 MG/5ML suspension Commonly known as: Augmentin Take 17.5 mLs (875 mg total) by mouth 2 (two) times daily for 5 days.   apixaban 2.5 MG Tabs tablet Commonly known as: ELIQUIS Take 1 tablet (2.5 mg total) by mouth 2 (two) times daily. What changed:  medication strength how much to take   furosemide 80 MG tablet Commonly known as: LASIX Take 1 tablet (80 mg total) by mouth 2 (two) times daily.   Jardiance 10 MG Tabs tablet Generic drug: empagliflozin Take 10 mg by mouth every morning.   levothyroxine 50 MCG tablet Commonly known as: SYNTHROID Take 50  mcg by mouth daily.   metoprolol tartrate 50 MG tablet Commonly known as: LOPRESSOR Take 1 tablet (50 mg total) by mouth 2 (two) times daily.   nitroGLYCERIN 0.4 MG SL tablet Commonly known as: NITROSTAT Place 0.4 mg under the tongue every 5 (five) minutes as needed for chest pain.   ondansetron 4 MG tablet Commonly known as: ZOFRAN Take 1 tablet (4 mg total) by mouth every 6 (six) hours. What changed:  when to take this reasons to take this   potassium chloride 10 MEQ tablet Commonly known as: KLOR-CON Take 1 tablet (10 mEq total) by mouth daily.               Durable Medical Equipment  (From admission, onward)           Start     Ordered   04/24/22 0917  For home use only DME Suction  Once       Question:  Suction  Answer:  Oral   04/24/22 0916             Consultations: Palliative care  Procedures/Studies:  CT ABDOMEN PELVIS WO CONTRAST  Result Date: 04/21/2022 CLINICAL DATA:  Acute abdominal pain  for several days EXAM: CT ABDOMEN AND PELVIS WITHOUT CONTRAST TECHNIQUE: Multidetector CT imaging of the abdomen and pelvis was performed following the standard protocol without IV contrast. RADIATION DOSE REDUCTION: This exam was performed according to the departmental dose-optimization program which includes automated exposure control, adjustment of the mA and/or kV according to patient size and/or use of iterative reconstruction technique. COMPARISON:  05/02/2021 FINDINGS: Lower chest: Right-sided pleural effusion and right lower lobe consolidation are noted. Minimal left basilar atelectasis is seen with small effusion. Hepatobiliary: Liver again demonstrates scattered simple appearing cysts stable from the prior exam. Gallbladder is decompressed. Scattered calcified granulomas are noted throughout the liver stable from the prior study. Pancreas: Diffuse atrophy is again noted. No pancreatic ductal dilatation or surrounding inflammatory changes. Spleen: Normal  in size without focal abnormality. Adrenals/Urinary Tract: Adrenal glands are within normal limits. Kidneys are well visualized bilaterally. No renal calculi or obstructive changes are seen. Bladder is partially distended. A large bladder calculus is noted within measuring up to 2.1 cm. This is stable from the prior exam. Stomach/Bowel: Scattered fecal material is noted throughout the colon. The appendix is not discretely visualized. Small bowel and stomach are within normal limits. Vascular/Lymphatic: Diffuse atherosclerotic calcifications are noted. No lymphadenopathy is seen. Reproductive: Prostate is unremarkable. Other: Minimal free fluid is noted within the pelvis. No definitive herniation is seen. Musculoskeletal: Degenerative changes of lumbar spine are noted. Old rib fractures are seen on the right. Chronic appearing T12 compression fracture is noted. Degenerative anterolisthesis of L4 on L5 is seen. IMPRESSION: Right lower lobe infiltrate with associated effusion. Mild left basilar atelectasis and small pleural effusion. Stable bladder calculus. Chronic changes as described above. Electronically Signed   By: Alcide Clever M.D.   On: 04/21/2022 21:15   DG Chest 2 View  Result Date: 04/21/2022 CLINICAL DATA:  cough, sob EXAM: CHEST - 2 VIEW COMPARISON:  Chest x-ray 03/11/2022, CT chest 05/21/2021, chest x-ray 03/08/2022 FINDINGS: Stable enlarged cardiac silhouette. The heart and mediastinal contours are unchanged. Atherosclerotic plaque. Surgical changes overlie the mediastinum. Limited evaluation of the lung apices due to overlying mandible. No focal consolidation. No pulmonary edema. Chronic coarsened interstitial markings with superimposed mild pulmonary edema. Persistent perihilar nodular interstitial markings. At least bilateral trace pleural effusions. No pneumothorax. No acute osseous abnormality.  Sternotomy wires are intact. IMPRESSION: 1. At least bilateral trace pleural effusions. 2. Mild  pulmonary edema. Electronically Signed   By: Tish Frederickson M.D.   On: 04/21/2022 19:56   LONG TERM MONITOR (3-14 DAYS)  Result Date: 03/29/2022 Patch Wear Time:  7 days and 3 hours (2023-10-19T16:09:30-0400 to 2023-10-26T19:27:56-0400) 6 Ventricular Tachycardia runs occurred, the run with the fastest interval lasting 4 beats with a max rate of 203 bpm, the longest lasting 9 beats with an avg rate of 163 bpm. Atrial Fibrillation occurred continuously (100% burden), ranging from 51-181 bpm (avg of 89 bpm). Bundle Branch Block/IVCD was present. Isolated VEs were rare (<1.0%, 3563), VE Couplets were rare (<1.0%, 77), and VE Triplets were rare (<1.0%, 3). Ventricular Bigeminy was present. Inverted QRS complexes possibly due to inverted placement of device. Charlton Haws MD [facility]     Subjective: - no chest pain, shortness of breath, no abdominal pain, nausea or vomiting.   Discharge Exam: BP (!) 134/98 (BP Location: Right Arm)   Pulse (!) 114   Temp (!) 97.5 F (36.4 C) (Oral)   Resp 20   Ht 5\' 10"  (1.778 m)   Wt 67.6 kg   SpO2  92%   BMI 21.38 kg/m   General: Pt is alert, awake, not in acute distress Cardiovascular: Irregular Respiratory: Diminished at the bases Abdominal: Soft, NT, ND, bowel sounds + Extremities: trace edema   The results of significant diagnostics from this hospitalization (including imaging, microbiology, ancillary and laboratory) are listed below for reference.     Microbiology: Recent Results (from the past 240 hour(s))  Resp Panel by RT-PCR (Flu A&B, Covid) Anterior Nasal Swab     Status: None   Collection Time: 04/21/22  6:12 PM   Specimen: Anterior Nasal Swab  Result Value Ref Range Status   SARS Coronavirus 2 by RT PCR NEGATIVE NEGATIVE Final    Comment: (NOTE) SARS-CoV-2 target nucleic acids are NOT DETECTED.  The SARS-CoV-2 RNA is generally detectable in upper respiratory specimens during the acute phase of infection. The lowest concentration of  SARS-CoV-2 viral copies this assay can detect is 138 copies/mL. A negative result does not preclude SARS-Cov-2 infection and should not be used as the sole basis for treatment or other patient management decisions. A negative result may occur with  improper specimen collection/handling, submission of specimen other than nasopharyngeal swab, presence of viral mutation(s) within the areas targeted by this assay, and inadequate number of viral copies(<138 copies/mL). A negative result must be combined with clinical observations, patient history, and epidemiological information. The expected result is Negative.  Fact Sheet for Patients:  BloggerCourse.com  Fact Sheet for Healthcare Providers:  SeriousBroker.it  This test is no t yet approved or cleared by the [place] FDA and  has been authorized for detection and/or diagnosis of SARS-CoV-2 by FDA under an Emergency Use Authorization (EUA). This EUA will remain  in effect (meaning this test can be used) for the duration of the COVID-19 declaration under Section 564(b)(1) of the Act, 21 U.S.C.section 360bbb-3(b)(1), unless the authorization is terminated  or revoked sooner.       Influenza A by PCR NEGATIVE NEGATIVE Final   Influenza B by PCR NEGATIVE NEGATIVE Final    Comment: (NOTE) The Xpert Xpress SARS-CoV-2/FLU/RSV plus assay is intended as an aid in the diagnosis of influenza from Nasopharyngeal swab specimens and should not be used as a sole basis for treatment. Nasal washings and aspirates are unacceptable for Xpert Xpress SARS-CoV-2/FLU/RSV testing.  Fact Sheet for Patients: BloggerCourse.com  Fact Sheet for Healthcare Providers: SeriousBroker.it  This test is not yet approved or cleared by the [place] FDA and has been authorized for detection and/or diagnosis of SARS-CoV-2 by FDA under an Emergency Use  Authorization (EUA). This EUA will remain in effect (meaning this test can be used) for the duration of the COVID-19 declaration under Section 564(b)(1) of the Act, 21 U.S.C. section 360bbb-3(b)(1), unless the authorization is terminated or revoked.  Performed at [facility], [address], [place], [place] [address]   Culture, blood (routine x 2)     Status: None (Preliminary result)   Collection Time: 04/21/22  9:50 PM   Specimen: BLOOD LEFT FOREARM  Result Value Ref Range Status   Specimen Description BLOOD LEFT FOREARM  Final   Special Requests   Final    BOTTLES DRAWN AEROBIC AND ANAEROBIC Blood Culture adequate volume   Culture   Final    NO GROWTH 3 DAYS Performed at [facility], [address], [place], [place] [address]    Report Status PENDING  Incomplete  Culture, blood (routine x 2)     Status: None (Preliminary result)  Collection Time: 04/21/22  9:59 PM   Specimen: BLOOD RIGHT FOREARM  Result Value Ref Range Status   Specimen Description BLOOD RIGHT FOREARM  Final   Special Requests   Final    BOTTLES DRAWN AEROBIC AND ANAEROBIC Blood Culture results may not be optimal due to an inadequate volume of blood received in culture bottles   Culture   Final    NO GROWTH 3 DAYS Performed at [facility], [address] [place], [place] [address]    Report Status PENDING  Incomplete     Labs: Basic Metabolic Panel: Recent Labs  Lab 04/21/22 1855 04/22/22 1007 04/23/22 0032 04/24/22 0243  NA 144 143 143 141  K 4.2 4.3 4.3 3.9  CL 103 104 108 107  CO2 26 24 24 25   GLUCOSE 99 114* 124* 111*  BUN 31* 32* 36* 35*  CREATININE 1.52* 1.53* 1.67* 1.56*  CALCIUM 9.6 8.6* 8.6* 8.7*  MG  --   --  2.3 2.4  PHOS  --   --  3.8  --    Liver Function Tests: Recent Labs  Lab 04/21/22 1855 04/22/22 1007 04/23/22 0032  AST 33 37 50*  ALT 23 23 28   ALKPHOS 176* 168* 171*  BILITOT 1.7* 1.5* 1.2  PROT 7.8 6.6 6.2*  ALBUMIN 3.7 3.1* 2.8*    CBC: Recent Labs  Lab 04/21/22 1855 04/22/22 1007 04/23/22 0032 04/24/22 0243  WBC 11.3* 10.3 8.9 7.9  HGB 16.2 15.2 14.7 15.2  HCT 49.2 45.0 43.9 47.1  MCV 99.0 100.7* 99.1 101.7*  PLT 174 154 156 170   CBG: Recent Labs  Lab 04/21/22 2314 04/22/22 2312  GLUCAP 145* 163*   Hgb A1c No results for input(s): "HGBA1C" in the last 72 hours. Lipid Profile No results for input(s): "CHOL", "HDL", "LDLCALC", "TRIG", "CHOLHDL", "LDLDIRECT" in the last 72 hours. Thyroid function studies Recent Labs    04/22/22 1313  TSH 4.157   Urinalysis    Component Value Date/Time   COLORURINE YELLOW 10/07/2021 0021   APPEARANCEUR HAZY (A) 10/07/2021 0021   LABSPEC 1.025 10/07/2021 0021   PHURINE 5.0 10/07/2021 0021   GLUCOSEU >=500 (A) 10/07/2021 0021   HGBUR NEGATIVE 10/07/2021 0021   BILIRUBINUR NEGATIVE 10/07/2021 0021   KETONESUR NEGATIVE 10/07/2021 0021   PROTEINUR NEGATIVE 10/07/2021 0021   UROBILINOGEN 0.2 12/01/2013 1410   NITRITE NEGATIVE 10/07/2021 0021   LEUKOCYTESUR LARGE (A) 10/07/2021 0021    FURTHER DISCHARGE INSTRUCTIONS:   Get Medicines reviewed and adjusted: Please take all your medications with you for your next visit with your Primary MD   Laboratory/radiological data: Please request your Primary MD to go over all hospital tests and procedure/radiological results at the follow up, please ask your Primary MD to get all Hospital records sent to his/her office.   In some cases, they will be blood work, cultures and biopsy results pending at the time of your discharge. Please request that your primary care M.D. goes through all the records of your hospital data and follows up on these results.   Also Note the following: If you experience worsening of your admission symptoms, develop shortness of breath, life threatening emergency, suicidal or homicidal thoughts you must seek medical attention immediately by calling 911 or calling your MD immediately  if symptoms  less severe.   You must read complete instructions/literature along with all the possible adverse reactions/side effects for all the Medicines you take and that have been prescribed to you. Take any  new Medicines after you have completely understood and accpet all the possible adverse reactions/side effects.    Do not drive when taking Pain medications or sleeping medications (Benzodaizepines)   Do not take more than prescribed Pain, Sleep and Anxiety Medications. It is not advisable to combine anxiety,sleep and pain medications without talking with your primary care practitioner   Special Instructions: If you have smoked or chewed Tobacco  in the last 2 yrs please stop smoking, stop any regular Alcohol  and or any Recreational drug use.   Wear Seat belts while driving.   Please note: You were cared for by a hospitalist during your hospital stay. Once you are discharged, your primary care physician will handle any further medical issues. Please note that NO REFILLS for any discharge medications will be authorized once you are discharged, as it is imperative that you return to your primary care physician (or establish a relationship with a primary care physician if you do not have one) for your post hospital discharge needs so that they can reassess your need for medications and monitor your lab values.  Time coordinating discharge: 35 minutes  SIGNED:  Pamella Pert, MD, PhD 04/24/2022, 10:49 AM

## 2022-04-26 LAB — CULTURE, BLOOD (ROUTINE X 2)
Culture: NO GROWTH
Culture: NO GROWTH
Special Requests: ADEQUATE

## 2022-06-30 NOTE — Progress Notes (Incomplete)
CARDIOLOGY CONSULT NOTE       Patient ID: Edgar Thomas MRN: [id number] DOB/AGE: 87/09/1929 87 y.o.  Referring Physician: [name] Primary Physician: [name], MD Primary Cardiologist: [name] Reason for Consultation: CAD/PAF/CHF   HPI:  89 y.o. DNR referred by DR [name] 03/08/22 for CAD/CABG/PAF/ and CHF Previously seen by me in 2018 History of CABG in 2015 LIMA to LAD, Sequentiaql SVG to OM1/2, and SVG to PDA. Last echo in 2019 at [place] showed EF 35-40% with moderate MR Admitted 40/97/35 with metabolic encephalopathy flu positive and right femoral vein DVT Prior history of stroke was on eliquis and oncology did not recommend changing DOAC has been on Tikosyn for afib but appears chronic No CHF exacerbations or chest pains On beta blocker and lasix but ARB appears to have been stopped BUN 24 Cr 0.85 Note allergy to amiodarone   He is actually the father of one of my patients Edgar Thomas who has had MVR. His wife Edgar Thomas is about 90 years younger than him He has 5 kids and she had one before they married She seems to be very attentive and cares for him well  IN office 03/08/22 he moves slowly He has pursed lipped breathing and we are unable to get a good sat reading on him. He is in rapid afib rates 120 and he has gross volume overload with edema to mid thigh and component of lymph edema   He was sent to hospital after initial visit on 03/08/22  TTE showed EF 20-25% with mild/mod MR and mild AR RV was normal He was diuresed about 9 kg with d/c weight of 68.4 kg Meds includied lasix 80 mg bid, Jardiance , lopressor 50 mg bid K 10 Meq On admission BNP was 1431  In hospital again 11/24-27/23 with pneumonia ? Aspiration Rx iv antibiotics and transitioned to Augmentin on d/c He is DNR and palliative/hospice care at this point   ***  ROS All other systems reviewed and negative except as noted above  Past Medical History:  Diagnosis Date  . A-fib ([place])   . Anemia   . CAD (coronary artery disease)   .  Constipation   . GERD (gastroesophageal reflux disease)   . Heart attack ([place])   . Heartburn   . Hyperlipemia   . Hypertension   . Post herpetic neuralgia   . 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 Stroke ([place])   . TIA (transient ischemic attack)   . Vitamin D deficiency     Family History  Problem Relation Age of Onset  . Heart disease Mother        died at age 15 of heart attack  . Leukemia Sister   . Heart disease Brother   . Hypertension Brother     Social History   Socioeconomic History  . Marital status: Married    Spouse name: kim  . Number of children: 6  . Years of education: Not on file  . Highest education level: Not on file  Occupational History  . Occupation: retired    Comment: Research officer, trade [employer]  Tobacco Use  . Smoking status: Never  . Smokeless tobacco: Never  Substance and Sexual Activity  . Alcohol use: No  . Drug use: No  . Sexual activity: Not on file  Other Topics Concern  . Not on file  Social History Narrative   Lives with wife   Right Handed   Drinks no caffeine   Social Determinants of  Health   Financial Resource Strain: Low Risk  (03/09/2022)   Overall Financial Resource Strain (CARDIA)   . Difficulty of Paying Living Expenses: Not hard at all  Food Insecurity: No Food Insecurity (03/09/2022)   Hunger Vital Sign   . Worried About Charity fundraiser in the Last Year: Never true   . Ran Out of Food in the Last Year: Never true  Transportation Needs: No Transportation Needs (03/09/2022)   PRAPARE - Transportation   . Lack of Transportation (Medical): No   . Lack of Transportation (Non-Medical): No  Physical Activity: Not on file  Stress: Not on file  Social Connections: Not on file  Intimate Partner Violence: Not on file    Past Surgical History:  Procedure Laterality Date  . balloon angioplasty of LAD    . CARDIOVERSION  2018   a fib  . CATARACT EXTRACTION, BILATERAL  1019, 2022  .  COLONOSCOPY    . CORONARY ARTERY BYPASS GRAFT N/A 12/03/2013   Procedure: CORONARY ARTERY BYPASS GRAFTING (CABG) x4 using left internal mammary artery and right greater saphenous vein. LIMA to LAD, sequential SVG to OM 1 & OM 2, SVG to PD;  Surgeon: Grace Isaac, MD;  Location: [facility];  Service: Open Heart Surgery;  Laterality: N/A;  . ENDOVEIN HARVEST OF GREATER SAPHENOUS VEIN Right 12/03/2013   Procedure: ENDOVEIN HARVEST OF GREATER SAPHENOUS VEIN;  Surgeon: Grace Isaac, MD;  Location: [facility];  Service: Open Heart Surgery;  Laterality: Right;  . INTRAOPERATIVE TRANSESOPHAGEAL ECHOCARDIOGRAM N/A 12/03/2013   Procedure: INTRAOPERATIVE TRANSESOPHAGEAL ECHOCARDIOGRAM;  Surgeon: Grace Isaac, MD;  Location: [facility];  Service: Open Heart Surgery;  Laterality: N/A;      Current Outpatient Medications:  .  apixaban (ELIQUIS) 2.5 MG TABS tablet, Take 1 tablet (2.5 mg total) by mouth 2 (two) times daily., Disp: 60 tablet, Rfl: 1 .  furosemide (LASIX) 80 MG tablet, Take 1 tablet (80 mg total) by mouth 2 (two) times daily., Disp: 180 tablet, Rfl: 3 .  JARDIANCE 10 MG TABS tablet, Take 10 mg by mouth every morning., Disp: , Rfl:  .  levothyroxine (SYNTHROID) 50 MCG tablet, Take 50 mcg by mouth daily., Disp: , Rfl:  .  metoprolol tartrate (LOPRESSOR) 50 MG tablet, Take 1 tablet (50 mg total) by mouth 2 (two) times daily., Disp: 180 tablet, Rfl: 3 .  nitroGLYCERIN (NITROSTAT) 0.4 MG SL tablet, Place 0.4 mg under the tongue every 5 (five) minutes as needed for chest pain., Disp: , Rfl:  .  ondansetron (ZOFRAN) 4 MG tablet, Take 1 tablet (4 mg total) by mouth every 6 (six) hours. (Patient taking differently: Take 4 mg by mouth daily as needed for nausea.), Disp: 12 tablet, Rfl: 0 .  potassium chloride (KLOR-CON) 10 MEQ tablet, Take 1 tablet (10 mEq total) by mouth daily., Disp: 30 tablet, Rfl: 6    Physical Exam: There were no vitals taken for this visit.    Elderly male Dementia Lungs clear PMI  increased no murmur Plus 2 LE edema ankles    Labs:   Lab Results  Component Value Date   WBC 7.9 04/24/2022   HGB 15.2 04/24/2022   HCT 47.1 04/24/2022   MCV 101.7 (H) 04/24/2022   PLT 170 04/24/2022   No results for input(s): "NA", "K", "CL", "CO2", "BUN", "CREATININE", "CALCIUM", "PROT", "BILITOT", "ALKPHOS", "ALT", "AST", "GLUCOSE" in the last 168 hours.  Invalid input(s): "LABALBU" Lab Results  Component Value Date   CKTOTAL 38 (  L) 05/02/2021    Lab Results  Component Value Date   CHOL 100 11/11/2018   Lab Results  Component Value Date   HDL 35 (L) 11/11/2018   Lab Results  Component Value Date   LDLCALC 54 11/11/2018   Lab Results  Component Value Date   TRIG 54 11/11/2018   Lab Results  Component Value Date   CHOLHDL 2.9 11/11/2018   No results found for: "LDLDIRECT"    Radiology: No results found.  EKG: afib nonspecific ST changes IvCD   ASSESSMENT AND PLAN:   Afib:  Cardizem d/c chronic with severe bi atrial enlargement Tikosyn d/c intolerant to amiodarone in past Rate control strategy with anticoagulation with Eliquis Dosing reduced with age, low weight and  Cr 1.56  CAD:  Distant CABG no chest pain given age and lack of symptoms with DNR status no indication for stress testing Ischemic DCM:  EF 20-25% diuresed with lasix 80 mf bid with K supplementation BP low will avoid ARB/ARNI for now  DM:  Discussed low carb diet.  Target hemoglobin A1c is 6.5 or less.  Continue current medications. DVT:  right femoral vein by duplex 05/22/21   COPD:  contributes to hypoxemia recent pneumonia and aspiration Palliative/Hospice care DNR    F/U PRN   Signed: Jenkins Rouge 06/30/2022, 11:57 AM

## 2022-07-07 ENCOUNTER — Ambulatory Visit: Payer: Medicare Other | Admitting: Cardiovascular Disease

## 2022-10-26 ENCOUNTER — Other Ambulatory Visit: Payer: Self-pay

## 2022-10-26 MED ORDER — POTASSIUM CHLORIDE ER 10 MEQ PO TBCR: 10.0000 meq | EXTENDED_RELEASE_TABLET | Freq: Every day | ORAL | 5 refills | Status: DC

## 2022-11-23 DIAGNOSIS — I480 Paroxysmal atrial fibrillation: Secondary | ICD-10-CM | POA: Diagnosis not present

## 2022-11-23 DIAGNOSIS — E261 Secondary hyperaldosteronism: Secondary | ICD-10-CM | POA: Diagnosis not present

## 2022-11-23 DIAGNOSIS — D6869 Other thrombophilia: Secondary | ICD-10-CM | POA: Diagnosis not present

## 2022-11-23 DIAGNOSIS — I509 Heart failure, unspecified: Secondary | ICD-10-CM | POA: Diagnosis not present

## 2023-04-25 ENCOUNTER — Other Ambulatory Visit: Payer: Self-pay | Admitting: Cardiovascular Disease

## 2023-04-25 NOTE — Telephone Encounter (Signed)
Per last ov cancellation note-pt in Hospice. Are we still filling medications or? Please review. Thank you

## 2023-08-28 DEATH — deceased
